# Patient Record
Sex: Female | Born: 1940 | Race: White | Hispanic: No | Marital: Married | Smoking: Former smoker
Health system: Southern US, Community
[De-identification: ages and names within clinical notes are randomized; demographics above are authoritative.]

## PROBLEM LIST (undated history)

## (undated) DIAGNOSIS — K219 Gastro-esophageal reflux disease without esophagitis: Secondary | ICD-10-CM

## (undated) DIAGNOSIS — M2550 Pain in unspecified joint: Secondary | ICD-10-CM

## (undated) DIAGNOSIS — E039 Hypothyroidism, unspecified: Secondary | ICD-10-CM

## (undated) DIAGNOSIS — Z923 Personal history of irradiation: Secondary | ICD-10-CM

## (undated) DIAGNOSIS — D696 Thrombocytopenia, unspecified: Secondary | ICD-10-CM

## (undated) DIAGNOSIS — D649 Anemia, unspecified: Secondary | ICD-10-CM

## (undated) DIAGNOSIS — D709 Neutropenia, unspecified: Secondary | ICD-10-CM

## (undated) DIAGNOSIS — I2510 Atherosclerotic heart disease of native coronary artery without angina pectoris: Secondary | ICD-10-CM

## (undated) DIAGNOSIS — R001 Bradycardia, unspecified: Secondary | ICD-10-CM

## (undated) DIAGNOSIS — T451X5A Adverse effect of antineoplastic and immunosuppressive drugs, initial encounter: Secondary | ICD-10-CM

## (undated) DIAGNOSIS — E669 Obesity, unspecified: Secondary | ICD-10-CM

## (undated) DIAGNOSIS — D6481 Anemia due to antineoplastic chemotherapy: Secondary | ICD-10-CM

## (undated) DIAGNOSIS — Z5111 Encounter for antineoplastic chemotherapy: Secondary | ICD-10-CM

## (undated) DIAGNOSIS — C3492 Malignant neoplasm of unspecified part of left bronchus or lung: Secondary | ICD-10-CM

## (undated) DIAGNOSIS — N6019 Diffuse cystic mastopathy of unspecified breast: Secondary | ICD-10-CM

## (undated) DIAGNOSIS — E785 Hyperlipidemia, unspecified: Secondary | ICD-10-CM

## (undated) DIAGNOSIS — J189 Pneumonia, unspecified organism: Secondary | ICD-10-CM

## (undated) DIAGNOSIS — J449 Chronic obstructive pulmonary disease, unspecified: Secondary | ICD-10-CM

## (undated) DIAGNOSIS — R5081 Fever presenting with conditions classified elsewhere: Secondary | ICD-10-CM

## (undated) DIAGNOSIS — N2889 Other specified disorders of kidney and ureter: Secondary | ICD-10-CM

## (undated) DIAGNOSIS — L03113 Cellulitis of right upper limb: Secondary | ICD-10-CM

## (undated) DIAGNOSIS — M1990 Unspecified osteoarthritis, unspecified site: Secondary | ICD-10-CM

## (undated) DIAGNOSIS — I499 Cardiac arrhythmia, unspecified: Secondary | ICD-10-CM

## (undated) DIAGNOSIS — I10 Essential (primary) hypertension: Secondary | ICD-10-CM

## (undated) HISTORY — DX: Anemia due to antineoplastic chemotherapy: D64.81

## (undated) HISTORY — PX: TUBAL LIGATION: SHX77

## (undated) HISTORY — DX: Thrombocytopenia, unspecified: D69.6

## (undated) HISTORY — DX: Atherosclerotic heart disease of native coronary artery without angina pectoris: I25.10

## (undated) HISTORY — DX: Obesity, unspecified: E66.9

## (undated) HISTORY — DX: Essential (primary) hypertension: I10

## (undated) HISTORY — DX: Chronic obstructive pulmonary disease, unspecified: J44.9

## (undated) HISTORY — DX: Pain in unspecified joint: M25.50

## (undated) HISTORY — DX: Neutropenia, unspecified: D70.9

## (undated) HISTORY — DX: Encounter for antineoplastic chemotherapy: Z51.11

## (undated) HISTORY — DX: Hyperlipidemia, unspecified: E78.5

## (undated) HISTORY — DX: Adverse effect of antineoplastic and immunosuppressive drugs, initial encounter: T45.1X5A

## (undated) HISTORY — DX: Bradycardia, unspecified: R00.1

## (undated) HISTORY — DX: Malignant neoplasm of unspecified part of left bronchus or lung: C34.92

## (undated) HISTORY — DX: Diffuse cystic mastopathy of unspecified breast: N60.19

## (undated) HISTORY — DX: Fever presenting with conditions classified elsewhere: R50.81

## (undated) HISTORY — DX: Cellulitis of right upper limb: L03.113

## (undated) HISTORY — DX: Hypothyroidism, unspecified: E03.9

## (undated) HISTORY — PX: CHOLECYSTECTOMY: SHX55

## (undated) HISTORY — DX: Other specified disorders of kidney and ureter: N28.89

---

## 1999-08-30 ENCOUNTER — Other Ambulatory Visit: Admission: RE | Admit: 1999-08-30 | Discharge: 1999-08-30 | Payer: Self-pay | Admitting: *Deleted

## 2000-11-20 ENCOUNTER — Encounter: Admission: RE | Admit: 2000-11-20 | Discharge: 2000-11-20 | Payer: Self-pay | Admitting: *Deleted

## 2000-11-20 ENCOUNTER — Encounter: Payer: Self-pay | Admitting: *Deleted

## 2009-10-10 DIAGNOSIS — I2510 Atherosclerotic heart disease of native coronary artery without angina pectoris: Secondary | ICD-10-CM

## 2009-10-10 HISTORY — PX: CORONARY ANGIOPLASTY WITH STENT PLACEMENT: SHX49

## 2009-10-10 HISTORY — DX: Atherosclerotic heart disease of native coronary artery without angina pectoris: I25.10

## 2010-03-15 ENCOUNTER — Inpatient Hospital Stay (HOSPITAL_BASED_OUTPATIENT_CLINIC_OR_DEPARTMENT_OTHER): Admission: RE | Admit: 2010-03-15 | Discharge: 2010-03-15 | Payer: Self-pay | Admitting: Interventional Cardiology

## 2010-03-19 ENCOUNTER — Inpatient Hospital Stay (HOSPITAL_COMMUNITY): Admission: RE | Admit: 2010-03-19 | Discharge: 2010-03-20 | Payer: Self-pay | Admitting: Interventional Cardiology

## 2010-12-27 LAB — POCT I-STAT, CHEM 8
BUN: 25 mg/dL — ABNORMAL HIGH (ref 6–23)
Calcium, Ion: 1.17 mmol/L (ref 1.12–1.32)
Chloride: 104 mEq/L (ref 96–112)
Creatinine, Ser: 1.3 mg/dL — ABNORMAL HIGH (ref 0.4–1.2)
Glucose, Bld: 108 mg/dL — ABNORMAL HIGH (ref 70–99)
HCT: 44 % (ref 36.0–46.0)
Hemoglobin: 15 g/dL (ref 12.0–15.0)
Potassium: 3.9 mEq/L (ref 3.5–5.1)
Sodium: 139 mEq/L (ref 135–145)
TCO2: 27 mmol/L (ref 0–100)

## 2010-12-27 LAB — CBC
HCT: 37.7 % (ref 36.0–46.0)
Hemoglobin: 13.3 g/dL (ref 12.0–15.0)
MCHC: 35.2 g/dL (ref 30.0–36.0)
MCV: 94.3 fL (ref 78.0–100.0)
Platelets: 239 10*3/uL (ref 150–400)
RBC: 4 MIL/uL (ref 3.87–5.11)
RDW: 14.6 % (ref 11.5–15.5)
WBC: 6.6 10*3/uL (ref 4.0–10.5)

## 2010-12-27 LAB — BASIC METABOLIC PANEL
BUN: 19 mg/dL (ref 6–23)
CO2: 30 mEq/L (ref 19–32)
Calcium: 9.3 mg/dL (ref 8.4–10.5)
Chloride: 106 mEq/L (ref 96–112)
Creatinine, Ser: 1.31 mg/dL — ABNORMAL HIGH (ref 0.4–1.2)
GFR calc Af Amer: 49 mL/min — ABNORMAL LOW (ref >60)
GFR calc non Af Amer: 40 mL/min — ABNORMAL LOW (ref >60)
Glucose, Bld: 104 mg/dL — ABNORMAL HIGH (ref 70–99)
Potassium: 5.1 mEq/L (ref 3.5–5.1)
Sodium: 142 mEq/L (ref 135–145)

## 2011-04-08 ENCOUNTER — Encounter (HOSPITAL_COMMUNITY): Payer: Self-pay

## 2011-04-11 ENCOUNTER — Ambulatory Visit (HOSPITAL_COMMUNITY)
Admission: RE | Admit: 2011-04-11 | Discharge: 2011-04-11 | Disposition: A | Discharge status: Home / Self Care | Payer: Medicare Other | Source: Ambulatory Visit | Attending: Interventional Cardiology | Admitting: Interventional Cardiology

## 2011-04-11 DIAGNOSIS — R0602 Shortness of breath: Secondary | ICD-10-CM | POA: Insufficient documentation

## 2011-04-11 LAB — PULMONARY FUNCTION TEST

## 2012-09-04 ENCOUNTER — Other Ambulatory Visit: Payer: Self-pay | Admitting: Family Medicine

## 2012-09-04 DIAGNOSIS — Z1231 Encounter for screening mammogram for malignant neoplasm of breast: Secondary | ICD-10-CM

## 2012-10-17 ENCOUNTER — Ambulatory Visit: Payer: Medicare Other

## 2012-11-05 ENCOUNTER — Ambulatory Visit
Admission: RE | Admit: 2012-11-05 | Discharge: 2012-11-05 | Disposition: A | Discharge status: Home / Self Care | Payer: Medicare Other | Source: Ambulatory Visit | Attending: Family Medicine | Admitting: Family Medicine

## 2012-11-05 DIAGNOSIS — Z1231 Encounter for screening mammogram for malignant neoplasm of breast: Secondary | ICD-10-CM

## 2012-11-09 ENCOUNTER — Other Ambulatory Visit: Payer: Self-pay | Admitting: Family Medicine

## 2012-11-09 DIAGNOSIS — R928 Other abnormal and inconclusive findings on diagnostic imaging of breast: Secondary | ICD-10-CM

## 2012-12-04 ENCOUNTER — Ambulatory Visit
Admission: RE | Admit: 2012-12-04 | Discharge: 2012-12-04 | Disposition: A | Discharge status: Home / Self Care | Payer: Medicare Other | Source: Ambulatory Visit | Attending: Family Medicine | Admitting: Family Medicine

## 2012-12-04 DIAGNOSIS — R928 Other abnormal and inconclusive findings on diagnostic imaging of breast: Secondary | ICD-10-CM

## 2013-07-08 ENCOUNTER — Other Ambulatory Visit: Payer: Self-pay | Admitting: Family Medicine

## 2013-07-08 DIAGNOSIS — N6320 Unspecified lump in the left breast, unspecified quadrant: Secondary | ICD-10-CM

## 2013-08-13 ENCOUNTER — Encounter: Payer: Self-pay | Admitting: Interventional Cardiology

## 2013-09-03 ENCOUNTER — Ambulatory Visit
Admission: RE | Admit: 2013-09-03 | Discharge: 2013-09-03 | Disposition: A | Discharge status: Home / Self Care | Payer: Self-pay | Source: Ambulatory Visit | Attending: Family Medicine | Admitting: Family Medicine

## 2013-09-03 DIAGNOSIS — N6320 Unspecified lump in the left breast, unspecified quadrant: Secondary | ICD-10-CM

## 2013-10-28 ENCOUNTER — Other Ambulatory Visit: Payer: Self-pay

## 2013-10-28 DIAGNOSIS — Z1231 Encounter for screening mammogram for malignant neoplasm of breast: Secondary | ICD-10-CM

## 2013-10-29 ENCOUNTER — Ambulatory Visit: Payer: Medicare Other | Admitting: Interventional Cardiology

## 2013-11-04 ENCOUNTER — Encounter: Payer: Self-pay | Admitting: Interventional Cardiology

## 2013-11-04 ENCOUNTER — Encounter: Payer: Self-pay | Admitting: *Deleted

## 2013-11-04 DIAGNOSIS — J449 Chronic obstructive pulmonary disease, unspecified: Secondary | ICD-10-CM | POA: Insufficient documentation

## 2013-11-04 DIAGNOSIS — I25119 Atherosclerotic heart disease of native coronary artery with unspecified angina pectoris: Secondary | ICD-10-CM | POA: Insufficient documentation

## 2013-11-04 DIAGNOSIS — I10 Essential (primary) hypertension: Secondary | ICD-10-CM | POA: Insufficient documentation

## 2013-11-04 DIAGNOSIS — I2510 Atherosclerotic heart disease of native coronary artery without angina pectoris: Secondary | ICD-10-CM | POA: Insufficient documentation

## 2013-11-04 DIAGNOSIS — E039 Hypothyroidism, unspecified: Secondary | ICD-10-CM | POA: Insufficient documentation

## 2013-11-11 ENCOUNTER — Encounter: Payer: Self-pay | Admitting: Interventional Cardiology

## 2013-11-11 ENCOUNTER — Ambulatory Visit (INDEPENDENT_AMBULATORY_CARE_PROVIDER_SITE_OTHER): Payer: Medicare HMO | Admitting: Interventional Cardiology

## 2013-11-11 VITALS — BP 118/70 | HR 77 | Ht 64.0 in | Wt 157.0 lb

## 2013-11-11 DIAGNOSIS — R0789 Other chest pain: Secondary | ICD-10-CM

## 2013-11-11 DIAGNOSIS — I2510 Atherosclerotic heart disease of native coronary artery without angina pectoris: Secondary | ICD-10-CM

## 2013-11-11 DIAGNOSIS — Z720 Tobacco use: Secondary | ICD-10-CM

## 2013-11-11 DIAGNOSIS — F17200 Nicotine dependence, unspecified, uncomplicated: Secondary | ICD-10-CM

## 2013-11-11 NOTE — Patient Instructions (Signed)
Your physician recommends that you continue on your current medications as directed. Please refer to the Current Medication list given to you today.  Your physician wants you to follow-up in: 1 year You will receive a reminder letter in the mail two months in advance. If you don't receive a letter, please call our office to schedule the follow-up appointment.   Call the office if your chest pain becomes exertional or worsens.[phone].   Smoking Cessation Quitting smoking is important to your health and has many advantages. However, it is not always easy to quit since nicotine is a very addictive drug. Often times, people try 3 times or more before being able to quit. This document explains the best ways for you to prepare to quit smoking. Quitting takes hard work and a lot of effort, but you can do it. ADVANTAGES OF QUITTING SMOKING  You will live longer, feel better, and live better.  Your body will feel the impact of quitting smoking almost immediately.  Within 20 minutes, blood pressure decreases. Your pulse returns to its normal level.  After 8 hours, carbon monoxide levels in the blood return to normal. Your oxygen level increases.  After 24 hours, the chance of having a heart attack starts to decrease. Your breath, hair, and body stop smelling like smoke.  After 48 hours, damaged nerve endings begin to recover. Your sense of taste and smell improve.  After 72 hours, the body is virtually free of nicotine. Your bronchial tubes relax and breathing becomes easier.  After 2 to 12 weeks, lungs can hold more air. Exercise becomes easier and circulation improves.  The risk of having a heart attack, stroke, cancer, or lung disease is greatly reduced.  After 1 year, the risk of coronary heart disease is cut in half.  After 5 years, the risk of stroke falls to the same as a nonsmoker.  After 10 years, the risk of lung cancer is cut in half and the risk of other cancers decreases  significantly.  After 15 years, the risk of coronary heart disease drops, usually to the level of a nonsmoker.  If you are pregnant, quitting smoking will improve your chances of having a healthy baby.  The people you live with, especially any children, will be healthier.  You will have extra money to spend on things other than cigarettes. QUESTIONS TO THINK ABOUT BEFORE ATTEMPTING TO QUIT You may want to talk about your answers with your caregiver.  Why do you want to quit?  If you tried to quit in the past, what helped and what did not?  What will be the most difficult situations for you after you quit? How will you plan to handle them?  Who can help you through the tough times? Your family? Friends? A caregiver?  What pleasures do you get from smoking? What ways can you still get pleasure if you quit? Here are some questions to ask your caregiver:  How can you help me to be successful at quitting?  What medicine do you think would be best for me and how should I take it?  What should I do if I need more help?  What is smoking withdrawal like? How can I get information on withdrawal? GET READY  Set a quit date.  Change your environment by getting rid of all cigarettes, ashtrays, matches, and lighters in your home, car, or work. Do not let people smoke in your home.  Review your past attempts to quit. Think about what  worked and what did not. GET SUPPORT AND ENCOURAGEMENT You have a better chance of being successful if you have help. You can get support in many ways.  Tell your family, friends, and co-workers that you are going to quit and need their support. Ask them not to smoke around you.  Get individual, group, or telephone counseling and support. Programs are available at [employer] and health centers. Call your local health department for information about programs in your area.  Spiritual beliefs and practices may help some smokers quit.  Download a "quit  meter" on your computer to keep track of quit statistics, such as how long you have gone without smoking, cigarettes not smoked, and money saved.  Get a self-help book about quitting smoking and staying off of tobacco. [place] yourself from urges to smoke. Talk to someone, go for a walk, or occupy your time with a task.  Change your normal routine. Take a different route to work. Drink tea instead of coffee. Eat breakfast in a different place.  Reduce your stress. Take a hot bath, exercise, or read a book.  Plan something enjoyable to do every day. Reward yourself for not smoking.  Explore interactive web-based programs that specialize in helping you quit. GET MEDICINE AND USE IT CORRECTLY Medicines can help you stop smoking and decrease the urge to smoke. Combining medicine with the above behavioral methods and support can greatly increase your chances of successfully quitting smoking.  Nicotine replacement therapy helps deliver nicotine to your body without the negative effects and risks of smoking. Nicotine replacement therapy includes nicotine gum, lozenges, inhalers, nasal sprays, and skin patches. Some may be available over-the-counter and others require a prescription.  Antidepressant medicine helps people abstain from smoking, but how this works is unknown. This medicine is available by prescription.  Nicotinic receptor partial agonist medicine simulates the effect of nicotine in your brain. This medicine is available by prescription. Ask your caregiver for advice about which medicines to use and how to use them based on your health history. Your caregiver will tell you what side effects to look out for if you choose to be on a medicine or therapy. Carefully read the information on the package. Do not use any other product containing nicotine while using a nicotine replacement product.  RELAPSE OR DIFFICULT SITUATIONS Most relapses occur within the  first 3 months after quitting. Do not be discouraged if you start smoking again. Remember, most people try several times before finally quitting. You may have symptoms of withdrawal because your body is used to nicotine. You may crave cigarettes, be irritable, feel very hungry, cough often, get headaches, or have difficulty concentrating. The withdrawal symptoms are only temporary. They are strongest when you first quit, but they will go away within 10 14 days. To reduce the chances of relapse, try to:  Avoid drinking alcohol. Drinking lowers your chances of successfully quitting.  Reduce the amount of caffeine you consume. Once you quit smoking, the amount of caffeine in your body increases and can give you symptoms, such as a rapid heartbeat, sweating, and anxiety.  Avoid smokers because they can make you want to smoke.  Do not let weight gain distract you. Many smokers will gain weight when they quit, usually less than 10 pounds. Eat a healthy diet and stay active. You can always lose the weight gained after you quit.  Find ways to improve your mood other than smoking. FOR MORE INFORMATION  www.smokefree.gov  Document Released: 09/20/2001 Document Revised: 03/27/2012 Document Reviewed: 01/05/2012 [facility] Patient Information 2014 [place], [place].

## 2013-11-11 NOTE — Progress Notes (Signed)
Patient ID: Renee Vance, female   DOB: 1941-04-28, 73 y.o.   MRN: [id number] Past Medical History  Hypertension   Hyperlipidemia   Hypothyroidism   Stage 3 chronic kidney disease (Dr. Olivia Mackie)   Obesity   Intermittent Back Pain   Fibrocystic Breasts   Tobacco Use   Premature Atrial complexes   CAD - s/p stent 2011   COPD      [address] [place], [place]  [address] Phone: [phone] Fax:  [phone]  Date:  11/11/2013   ID:  Renee Vance, DOB 1940-11-22, MRN [id number]  PCP:  Orpah Melter, MD   ASSESSMENT:  1. Coronary artery disease with prior history of coronary stent 2011. 2. Intermittent chest discomfort not responsive to nitroglycerin and non-exertional 3. Tobacco use, continues  PLAN:  1. Patient is cautioned to keep an eye on the chest discomfort that she complains of and let us know if it becomes exertional or worsens. 2. Smoking cessation 3. Clinical followup in one year   SUBJECTIVE: Renee Vance is a 73 y.o. female who continues to smoke and has a history of coronary disease with stent procedure done in 2011. She has COPD and chronic kidney disease. She has had no prolonged episodes of angina. There is no exertional chest discomfort. She occasionally has vague tightness in the chest that is low intensity. She denies syncope and peripheral edema. Is no claudication. This discomfort tends to occur at rest.   Wt Readings from Last 3 Encounters:  11/11/13 157 lb (71.215 kg)     Past Medical History  Diagnosis Date  . Hyperlipidemia   . HTN (hypertension)   . Pain in joint   . Coronary atherosclerosis of native coronary artery   . Dyspnea   . Bradycardia   . Dyspnea   . HTN (hypertension)   . Hypothyroidism   . Obesity   . Fibrocystic breast   . CAD (coronary artery disease)   . COPD (chronic obstructive pulmonary disease)     Current Outpatient Prescriptions  Medication Sig Dispense Refill  .  amLODipine (NORVASC) 5 MG tablet Take 5 mg by mouth daily.      Marland Kitchen aspirin 81 MG tablet Take 81 mg by mouth daily.      Marland Kitchen atorvastatin (LIPITOR) 80 MG tablet Take 80 mg by mouth daily.      . Cholecalciferol (VITAMIN D) 1000 UNITS capsule daily      . clotrimazole (LOTRIMIN) 1 % cream As needed      . levothyroxine (SYNTHROID, LEVOTHROID) 100 MCG tablet Take 100 mcg by mouth daily before breakfast.      . losartan (COZAAR) 100 MG tablet Take 100 mg by mouth daily.      Marland Kitchen tiotropium (SPIRIVA) 18 MCG inhalation capsule Place 18 mcg into inhaler and inhale daily.       No current facility-administered medications for this visit.    Allergies:    Allergies  Allergen Reactions  . Ampicillin     Makes sick  . Codeine     Makes sick    Social History:  The patient  reports that she has been smoking.  She does not have any smokeless tobacco history on file.   ROS:  Please see the history of present illness.   No neurological complaints. No bleeding or claudication. Dyspnea on exertion   All other systems reviewed and negative.   OBJECTIVE: VS:  BP 118/70  Pulse 77  Ht 5'  4" (1.626 m)  Wt 157 lb (71.215 kg)  BMI 26.94 kg/m2 Well nourished, well developed, in no acute distress, appears older than stated age 39: normal Neck: JVD flat. Carotid bruit absent  Cardiac:  normal S1, S2; RRR; no murmur Lungs:  clear to auscultation bilaterally, no wheezing, rhonchi or rales Abd: soft, nontender, no hepatomegaly Ext: Edema absent. Pulses 2+ and symmetric bilaterally Skin: warm and dry Neuro:  CNs 2-12 intact, no focal abnormalities noted  EKG:  Normal       Signed, Illene Labrador III, MD 11/11/2013 2:15 PM

## 2013-11-26 ENCOUNTER — Ambulatory Visit: Payer: Medicare Other

## 2013-12-17 ENCOUNTER — Ambulatory Visit
Admission: RE | Admit: 2013-12-17 | Discharge: 2013-12-17 | Disposition: A | Discharge status: Home / Self Care | Payer: Commercial Managed Care - HMO | Source: Ambulatory Visit

## 2013-12-17 DIAGNOSIS — Z1231 Encounter for screening mammogram for malignant neoplasm of breast: Secondary | ICD-10-CM

## 2014-11-11 ENCOUNTER — Encounter: Payer: Self-pay | Admitting: Interventional Cardiology

## 2014-11-11 ENCOUNTER — Other Ambulatory Visit: Payer: Self-pay

## 2014-11-11 ENCOUNTER — Ambulatory Visit (INDEPENDENT_AMBULATORY_CARE_PROVIDER_SITE_OTHER): Payer: Commercial Managed Care - HMO | Admitting: Interventional Cardiology

## 2014-11-11 VITALS — BP 130/70 | HR 85 | Ht 64.0 in | Wt 160.4 lb

## 2014-11-11 DIAGNOSIS — I2510 Atherosclerotic heart disease of native coronary artery without angina pectoris: Secondary | ICD-10-CM

## 2014-11-11 DIAGNOSIS — I491 Atrial premature depolarization: Secondary | ICD-10-CM

## 2014-11-11 DIAGNOSIS — Z1231 Encounter for screening mammogram for malignant neoplasm of breast: Secondary | ICD-10-CM

## 2014-11-11 DIAGNOSIS — J449 Chronic obstructive pulmonary disease, unspecified: Secondary | ICD-10-CM

## 2014-11-11 DIAGNOSIS — I10 Essential (primary) hypertension: Secondary | ICD-10-CM

## 2014-11-11 DIAGNOSIS — R0600 Dyspnea, unspecified: Secondary | ICD-10-CM

## 2014-11-11 DIAGNOSIS — E785 Hyperlipidemia, unspecified: Secondary | ICD-10-CM

## 2014-11-11 NOTE — Patient Instructions (Signed)
Your physician wants you to follow-up in: 1 year with Dr Tamala Julian. Laurey Morale 2017) You will receive a reminder letter in the mail two months in advance. If you don't receive a letter, please call our office to schedule the follow-up appointment.

## 2014-11-11 NOTE — Progress Notes (Signed)
Patient ID: Renee Vance, female   DOB: [birth year], 74 y.o.   MRN: [id number]    Cardiology Office Note   Date:  11/11/2014   ID:  Renee Vance, DOB 06/30/1941, MRN [id number]  PCP:  Orpah Melter, MD  Cardiologist:   Sinclair Grooms, MD   No chief complaint on file.     History of Present Illness: Renee Vance is a 74 y.o. female who presents for follow-up of coronary artery disease. She denies any chest discomfort. She has dyspnea on exertion related COPD. She finally stopped smoking last summer. She has not had palpitations or syncope.    Past Medical History  Diagnosis Date  . Hyperlipidemia   . HTN (hypertension)   . Pain in joint   . Coronary atherosclerosis of native coronary artery   . Dyspnea   . Bradycardia   . Dyspnea   . HTN (hypertension)   . Hypothyroidism   . Obesity   . Fibrocystic breast   . CAD (coronary artery disease)   . COPD (chronic obstructive pulmonary disease)     Past Surgical History  Procedure Laterality Date  . Cholecystectomy    . Tubal ligation       Current Outpatient Prescriptions  Medication Sig Dispense Refill  . amLODipine (NORVASC) 5 MG tablet Take 5 mg by mouth daily.    Marland Kitchen aspirin 81 MG tablet Take 81 mg by mouth daily.    Marland Kitchen atorvastatin (LIPITOR) 80 MG tablet Take 80 mg by mouth daily.    . clotrimazole (LOTRIMIN) 1 % cream As needed    . levothyroxine (SYNTHROID, LEVOTHROID) 100 MCG tablet Take 100 mcg by mouth daily before breakfast.    . losartan (COZAAR) 100 MG tablet Take 100 mg by mouth daily.    Marland Kitchen tiotropium (SPIRIVA) 18 MCG inhalation capsule Place 18 mcg into inhaler and inhale daily.    . Vitamin D, Cholecalciferol, 1000 UNITS CAPS Take 1,000 Int'l Units by mouth daily. 1000 iu daily by mouth daily     No current facility-administered medications for this visit.    Allergies:   Ampicillin and Codeine    Social History:  The patient  reports that she quit smoking about 7 months ago. She  does not have any smokeless tobacco history on file.   Family History:  The patient's family history includes Heart disease in her mother.    ROS:  Please see the history of present illness.   Otherwise, review of systems are positive for dyspnea on exertion and occasional wheezing. Improved since smoking cessation.   All other systems are reviewed and negative.    PHYSICAL EXAM: VS:  BP 130/70 mmHg  Pulse 85  Ht 5\' 4"  (1.626 m)  Wt 160 lb 6.4 oz (72.757 kg)  BMI 27.52 kg/m2  SpO2 95% , BMI Body mass index is 27.52 kg/(m^2). GEN: Well nourished, well developed, in no acute distress HEENT: normal Neck: no JVD, carotid bruits, or masses Cardiac: RRR; no murmurs, rubs, or gallops,no edema  Respiratory:  clear to auscultation bilaterally, normal work of breathing GI: soft, nontender, nondistended, + BS MS: no deformity or atrophy Skin: warm and dry, no rash Neuro:  Strength and sensation are intact Psych: euthymic mood, full affect   EKG:  EKG is ordered today. The ekg ordered today demonstrates normal sinus rhythm, nonspecific ST abnormality, poor R-wave progression, and left atrial abnormality.   Recent Labs: No results found for requested labs within last 365 days.  Lipid Panel No results found for: CHOL, TRIG, HDL, CHOLHDL, VLDL, LDLCALC, LDLDIRECT    Wt Readings from Last 3 Encounters:  11/11/14 160 lb 6.4 oz (72.757 kg)  11/11/13 157 lb (71.215 kg)      Other studies Reviewed: Additional studies/ records that were reviewed today include: Copies of the records obtained from [place]. Review of the above records demonstrates: A problem list that included stage III chronic kidney disease, COPD, premature atrial complexes, and hypothyroidism.   ASSESSMENT AND PLAN:  1.  Coronary atherosclerosis with drug-eluting LAD stent, the patient is asymptomatic with reference to ischemia/angina. 2. Chronic dyspnea on exertion likely related to COPD 3. Essential hypertension  under excellent control 4. Hyperlipidemia, followed by primary care 5. Occasional/intermittent palpitations at rest. No sustained tachycardia. No dizziness or lightheadedness. Prior monitoring his demonstrated PACs. No further workup   Current medicines are reviewed at length with the patient today.  The patient does not have concerns regarding medicines.  The following changes have been made:  no change  Labs/ tests ordered today include:   Orders Placed This Encounter  Procedures  . EKG 12-Lead     Disposition:   FU with Linard Millers in 12 months    Signed, Sinclair Grooms, MD  11/11/2014 12:30 PM    [facility] [address], [place], [place]  [address] Phone: [phone]; Fax: [phone]

## 2014-11-12 ENCOUNTER — Ambulatory Visit: Payer: Commercial Managed Care - HMO | Admitting: Interventional Cardiology

## 2014-12-22 ENCOUNTER — Ambulatory Visit
Admission: RE | Admit: 2014-12-22 | Discharge: 2014-12-22 | Disposition: A | Discharge status: Home / Self Care | Payer: Commercial Managed Care - HMO | Source: Ambulatory Visit

## 2014-12-22 DIAGNOSIS — Z1231 Encounter for screening mammogram for malignant neoplasm of breast: Secondary | ICD-10-CM

## 2015-02-06 ENCOUNTER — Emergency Department (HOSPITAL_COMMUNITY)
Admission: EM | Admit: 2015-02-06 | Discharge: 2015-02-06 | Disposition: A | Discharge status: Home / Self Care | Payer: Commercial Managed Care - HMO | Attending: Emergency Medicine | Admitting: Emergency Medicine

## 2015-02-06 ENCOUNTER — Telehealth: Payer: Self-pay | Admitting: Interventional Cardiology

## 2015-02-06 ENCOUNTER — Emergency Department (HOSPITAL_COMMUNITY): Payer: Commercial Managed Care - HMO

## 2015-02-06 ENCOUNTER — Encounter (HOSPITAL_COMMUNITY): Payer: Self-pay | Admitting: Neurology

## 2015-02-06 DIAGNOSIS — Z7982 Long term (current) use of aspirin: Secondary | ICD-10-CM | POA: Diagnosis not present

## 2015-02-06 DIAGNOSIS — Z79899 Other long term (current) drug therapy: Secondary | ICD-10-CM | POA: Diagnosis not present

## 2015-02-06 DIAGNOSIS — I2510 Atherosclerotic heart disease of native coronary artery without angina pectoris: Secondary | ICD-10-CM | POA: Insufficient documentation

## 2015-02-06 DIAGNOSIS — Z8742 Personal history of other diseases of the female genital tract: Secondary | ICD-10-CM | POA: Insufficient documentation

## 2015-02-06 DIAGNOSIS — R002 Palpitations: Secondary | ICD-10-CM

## 2015-02-06 DIAGNOSIS — E785 Hyperlipidemia, unspecified: Secondary | ICD-10-CM | POA: Insufficient documentation

## 2015-02-06 DIAGNOSIS — R079 Chest pain, unspecified: Secondary | ICD-10-CM | POA: Insufficient documentation

## 2015-02-06 DIAGNOSIS — J449 Chronic obstructive pulmonary disease, unspecified: Secondary | ICD-10-CM | POA: Diagnosis not present

## 2015-02-06 DIAGNOSIS — Z87891 Personal history of nicotine dependence: Secondary | ICD-10-CM | POA: Diagnosis not present

## 2015-02-06 DIAGNOSIS — E039 Hypothyroidism, unspecified: Secondary | ICD-10-CM | POA: Insufficient documentation

## 2015-02-06 DIAGNOSIS — E669 Obesity, unspecified: Secondary | ICD-10-CM | POA: Insufficient documentation

## 2015-02-06 DIAGNOSIS — I10 Essential (primary) hypertension: Secondary | ICD-10-CM | POA: Insufficient documentation

## 2015-02-06 LAB — I-STAT TROPONIN, ED
Troponin i, poc: 0 ng/mL (ref 0.00–0.08)
Troponin i, poc: 0.01 ng/mL (ref 0.00–0.08)

## 2015-02-06 LAB — BASIC METABOLIC PANEL
Anion gap: 10 (ref 5–15)
BUN: 21 mg/dL (ref 6–23)
CALCIUM: 9.8 mg/dL (ref 8.4–10.5)
CO2: 26 mmol/L (ref 19–32)
Chloride: 106 mmol/L (ref 96–112)
Creatinine, Ser: 1.59 mg/dL — ABNORMAL HIGH (ref 0.50–1.10)
GFR calc Af Amer: 36 mL/min — ABNORMAL LOW (ref >90)
GFR calc non Af Amer: 31 mL/min — ABNORMAL LOW (ref >90)
Glucose, Bld: 114 mg/dL — ABNORMAL HIGH (ref 70–99)
Potassium: 4.4 mmol/L (ref 3.5–5.1)
SODIUM: 142 mmol/L (ref 135–145)

## 2015-02-06 LAB — CBC
HEMATOCRIT: 39.6 % (ref 36.0–46.0)
Hemoglobin: 13.2 g/dL (ref 12.0–15.0)
MCH: 29.7 pg (ref 26.0–34.0)
MCHC: 33.3 g/dL (ref 30.0–36.0)
MCV: 89.2 fL (ref 78.0–100.0)
Platelets: 252 10*3/uL (ref 150–400)
RBC: 4.44 MIL/uL (ref 3.87–5.11)
RDW: 14 % (ref 11.5–15.5)
WBC: 7.3 10*3/uL (ref 4.0–10.5)

## 2015-02-06 NOTE — Discharge Instructions (Signed)
We saw you in the ER for the palpitations. All the results in the ER are normal, labs and imaging. We are not sure what is causing your symptoms. The workup in the ER is not complete, and is limited to screening for life threatening and emergent conditions only, so please see Cardiologist soon.  Palpitations A palpitation is the feeling that your heartbeat is irregular or is faster than normal. It may feel like your heart is fluttering or skipping a beat. Palpitations are usually not a serious problem. However, in some cases, you may need further medical evaluation. CAUSES  Palpitations can be caused by:  Smoking.  Caffeine or other stimulants, such as diet pills or energy drinks.  Alcohol.  Stress and anxiety.  Strenuous physical activity.  Fatigue.  Certain medicines.  Heart disease, especially if you have a history of irregular heart rhythms (arrhythmias), such as atrial fibrillation, atrial flutter, or supraventricular tachycardia.  An improperly working pacemaker or defibrillator. DIAGNOSIS  To find the cause of your palpitations, your health care provider will take your medical history and perform a physical exam. Your health care provider may also have you take a test called an ambulatory electrocardiogram (ECG). An ECG records your heartbeat patterns over a 24-hour period. You may also have other tests, such as:  Transthoracic echocardiogram (TTE). During echocardiography, sound waves are used to evaluate how blood flows through your heart.  Transesophageal echocardiogram (TEE).  Cardiac monitoring. This allows your health care provider to monitor your heart rate and rhythm in real time.  Holter monitor. This is a portable device that records your heartbeat and can help diagnose heart arrhythmias. It allows your health care provider to track your heart activity for several days, if needed.  Stress tests by exercise or by giving medicine that makes the heart beat  faster. TREATMENT  Treatment of palpitations depends on the cause of your symptoms and can vary greatly. Most cases of palpitations do not require any treatment other than time, relaxation, and monitoring your symptoms. Other causes, such as atrial fibrillation, atrial flutter, or supraventricular tachycardia, usually require further treatment. HOME CARE INSTRUCTIONS   Avoid:  Caffeinated coffee, tea, soft drinks, diet pills, and energy drinks.  Chocolate.  Alcohol.  Stop smoking if you smoke.  Reduce your stress and anxiety. Things that can help you relax include:  A method of controlling things in your body, such as your heartbeats, with your mind (biofeedback).  Yoga.  Meditation.  Physical activity such as swimming, jogging, or walking.  Get plenty of rest and sleep. SEEK MEDICAL CARE IF:   You continue to have a fast or irregular heartbeat beyond 24 hours.  Your palpitations occur more often. SEEK IMMEDIATE MEDICAL CARE IF:  You have chest pain or shortness of breath.  You have a severe headache.  You feel dizzy or you faint. MAKE SURE YOU:  Understand these instructions.  Will watch your condition.  Will get help right away if you are not doing well or get worse. Document Released: 09/23/2000 Document Revised: 10/01/2013 Document Reviewed: 11/25/2011 [facility] Patient Information 2015 [place], [place]. This information is not intended to replace advice given to you by your health care provider. Make sure you discuss any questions you have with your health care provider.

## 2015-02-06 NOTE — ED Notes (Signed)
Pt ambulated to restroom. Placed back on cardiac marker when returned to room.

## 2015-02-06 NOTE — ED Notes (Signed)
Patient transported to X-ray 

## 2015-02-06 NOTE — Telephone Encounter (Signed)
Message noted pt went to the ED.attempted to call pt back

## 2015-02-06 NOTE — Telephone Encounter (Signed)
Follow Up       Pt calling stating that her symptoms are getting worse and she hasn't heard back from anyone yet so she is going to the ER.

## 2015-02-06 NOTE — Telephone Encounter (Signed)
New Message   Patient c/o Palpitations:  High priority if patient c/o lightheadedness and shortness of breath.  1. How long have you been having palpitations?since 4 am  2. Are you currently experiencing lightheadedness and shortness of breath?SOB   3. Have you checked your BP and heart rate? (document readings) no   4. Are you experiencing any other symptoms?no

## 2015-02-06 NOTE — ED Notes (Signed)
Pt reports feeling like her heart is racing and having palpations since yesterday. Feeling some sob. Denies pain. Pt is a x 4.

## 2015-02-06 NOTE — ED Provider Notes (Signed)
CSN: [id number]     Arrival date & time 02/06/15  1035 History   First MD Initiated Contact with Patient 02/06/15 1121     Chief Complaint  Patient presents with  . Palpitations     (Consider location/radiation/quality/duration/timing/severity/associated sxs/prior Treatment) HPI Comments: Pt comes in with cc of palpitations. Pt has hx of CAD, COPD. She reports that for the past 2 days she has been having intermittent palpitations. With the palpations she gets some chest discomfort. This AM, pt had palpitations, described as "racing heart and possibly skipping of beats" from 4 am to 8 am. Pt had no dib, dizziness, but felt tired. She called Cardiology, but didn't hear back from them, so she decided to come to the ER and get checked out. She has no symptoms currently. No precipitating, aggravaiting or relieving factors.  Patient is a 74 y.o. female presenting with palpitations. The history is provided by the patient.  Palpitations Associated symptoms: chest pain   Associated symptoms: no nausea, no shortness of breath and no vomiting     Past Medical History  Diagnosis Date  . Hyperlipidemia   . HTN (hypertension)   . Pain in joint   . Coronary atherosclerosis of native coronary artery   . Dyspnea   . Bradycardia   . Dyspnea   . HTN (hypertension)   . Hypothyroidism   . Obesity   . Fibrocystic breast   . CAD (coronary artery disease)   . COPD (chronic obstructive pulmonary disease)    Past Surgical History  Procedure Laterality Date  . Cholecystectomy    . Tubal ligation     Family History  Problem Relation Age of Onset  . Heart disease Mother    History  Substance Use Topics  . Smoking status: Former Smoker    Quit date: 04/10/2014  . Smokeless tobacco: Not on file     Comment: june 2015  . Alcohol Use: Not on file   OB History    No data available     Review of Systems  Constitutional: Positive for activity change.  Respiratory: Negative for shortness of  breath.   Cardiovascular: Positive for chest pain and palpitations.  Gastrointestinal: Negative for nausea, vomiting and abdominal pain.  Genitourinary: Negative for dysuria.  Musculoskeletal: Negative for neck pain.  Neurological: Negative for syncope and headaches.      Allergies  Ampicillin and Codeine  Home Medications   Prior to Admission medications   Medication Sig Start Date End Date Taking? Authorizing Provider  amLODipine (NORVASC) 5 MG tablet Take 5 mg by mouth daily.    Historical Provider, MD  aspirin 81 MG tablet Take 81 mg by mouth daily.    Historical Provider, MD  atorvastatin (LIPITOR) 80 MG tablet Take 80 mg by mouth daily.    Historical Provider, MD  clotrimazole (LOTRIMIN) 1 % cream As needed 10/19/13   Historical Provider, MD  levothyroxine (SYNTHROID, LEVOTHROID) 100 MCG tablet Take 100 mcg by mouth daily before breakfast.    Historical Provider, MD  losartan (COZAAR) 100 MG tablet Take 100 mg by mouth daily.    Historical Provider, MD  tiotropium (SPIRIVA) 18 MCG inhalation capsule Place 18 mcg into inhaler and inhale daily.    Historical Provider, MD  Vitamin D, Cholecalciferol, 1000 UNITS CAPS Take 1,000 Int'l Units by mouth daily. 1000 iu daily by mouth daily 08/25/14   Historical Provider, MD   BP 133/89 mmHg  Pulse 69  Temp(Src) 98.4 F (36.9 C) (Oral)  Resp  17  Ht '5\' 4"'$  (1.626 m)  Wt 150 lb (68.04 kg)  BMI 25.73 kg/m2  SpO2 99% Physical Exam  Constitutional: She is oriented to person, place, and time. She appears well-developed and well-nourished.  HENT:  Head: Normocephalic and atraumatic.  Eyes: EOM are normal. Pupils are equal, round, and reactive to light.  Neck: Neck supple.  Cardiovascular: Normal rate and regular rhythm.   Pulmonary/Chest: Effort normal. No respiratory distress.  Abdominal: Soft. She exhibits no distension. There is no tenderness. There is no rebound and no guarding.  Neurological: She is alert and oriented to person,  place, and time.  Skin: Skin is warm and dry.  Nursing note and vitals reviewed.   ED Course  Procedures (including critical care time) Labs Review Labs Reviewed  BASIC METABOLIC PANEL - Abnormal; Notable for the following:    Glucose, Bld 114 (*)    Creatinine, Ser 1.59 (*)    GFR calc non Af Amer 31 (*)    GFR calc Af Amer 36 (*)    All other components within normal limits  CBC  I-STAT TROPOININ, ED  Randolm Idol, ED    Imaging Review Dg Chest 2 View  02/06/2015   CLINICAL DATA:  Cardiac palpitations  EXAM: CHEST  2 VIEW  COMPARISON:  April 04, 2011  FINDINGS: There is no edema or consolidation. Heart size and pulmonary vascularity are normal. No adenopathy. No bone lesions.  IMPRESSION: No edema or consolidation.   Electronically Signed   By: Lowella Grip III M.D.   On: 02/06/2015 11:46     EKG Interpretation   Date/Time:  Friday February 06 2015 10:43:16 EDT Ventricular Rate:  82 PR Interval:  179 QRS Duration: 83 QT Interval:  348 QTC Calculation: 406 R Axis:   55 Text Interpretation:  Sinus tachycardia Multiform ventricular premature  complexes Probable anteroseptal infarct, old No significant change since  last tracing Confirmed by Kathrynn Humble, MD, Thelma Comp [phone]) on 02/06/2015  10:56:33 AM      MDM   Final diagnoses:  Palpitations    Pt comes in with cc of palpitations. She is asymptomatic. Hx of CAD, and had some chest discomfort with palpitations. Trops ordered. Will monitor for a little bit, if we have no + tracings, then i have already spoken with the Cards master and pt will be reached out for the appointment.   Varney Biles, MD 02/06/15 1537

## 2015-02-09 ENCOUNTER — Telehealth: Payer: Self-pay | Admitting: Interventional Cardiology

## 2015-02-09 DIAGNOSIS — R002 Palpitations: Secondary | ICD-10-CM

## 2015-02-09 NOTE — Telephone Encounter (Signed)
New message     Patient calling back to speak to nurse from Friday.

## 2015-02-09 NOTE — Telephone Encounter (Signed)
Returned pt call. Pt sts that she has been doing well since her trip to the ED. She reports that on the morning of 4/29. She has an episode of palpitations and it felt like her heart was racing. She decided to go to the ED. By that time her symptoms had resolved. She was monitored at the ED for 4-5 hours than released. The ED physician told her she needs to wear a cardiac monitor for a couple of days, and that some one from [facility] heartcare would call her to schedule. Adv her that I did not see an order. Adv Her I will update Dr.Smith, to make sure he is in agreement, if so a scheduler from our office will call her to schedule. Pt thanked me for the assistance and verbalized understanding

## 2015-02-10 NOTE — Addendum Note (Signed)
Addended by: Lamar Laundry on: 02/10/2015 05:32 PM   Modules accepted: Orders

## 2015-02-10 NOTE — Telephone Encounter (Signed)
Pt aware of Dr.Smith's response She should wear a thirty-day continuous monitor to exclude atrial fibrillation Adv her a scheduler from our office will call her to schedule.

## 2015-02-10 NOTE — Telephone Encounter (Signed)
Routed to Dr.Smith to advise 

## 2015-02-10 NOTE — Telephone Encounter (Signed)
She should wear a thirty-day continuous monitor to exclude atrial fibrillation

## 2015-02-16 ENCOUNTER — Telehealth: Payer: Self-pay | Admitting: Interventional Cardiology

## 2015-02-16 DIAGNOSIS — R002 Palpitations: Secondary | ICD-10-CM

## 2015-02-16 NOTE — Telephone Encounter (Signed)
New message    Patient calling regarding scheduling heart monitor & going away for 30 days please advise.

## 2015-02-16 NOTE — Telephone Encounter (Signed)
Pt monitor appt scheduled for 5/10

## 2015-02-17 ENCOUNTER — Ambulatory Visit (INDEPENDENT_AMBULATORY_CARE_PROVIDER_SITE_OTHER): Payer: Commercial Managed Care - HMO

## 2015-02-17 ENCOUNTER — Encounter: Payer: Self-pay | Admitting: *Deleted

## 2015-02-17 DIAGNOSIS — R002 Palpitations: Secondary | ICD-10-CM

## 2015-02-17 NOTE — Progress Notes (Signed)
Patient ID: Renee Vance, female   DOB: 1941/02/24, 74 y.o.   MRN: [id number] [employer] 30 day cardiac event monitor applied to patient.

## 2015-03-26 ENCOUNTER — Encounter: Payer: Self-pay | Admitting: Interventional Cardiology

## 2015-03-26 DIAGNOSIS — I480 Paroxysmal atrial fibrillation: Secondary | ICD-10-CM | POA: Insufficient documentation

## 2015-03-30 ENCOUNTER — Telehealth: Payer: Self-pay

## 2015-03-30 MED ORDER — METOPROLOL SUCCINATE ER 25 MG PO TB24: 25.0000 mg | ORAL_TABLET | Freq: Every day | ORAL | Status: DC

## 2015-03-30 NOTE — Telephone Encounter (Signed)
Pt aware of Cardiac monitor results and Dr.Smith's recommendations -Brief afib runs -START metoprolol Succ '25mg'$  daily -OV in 1 month to discuss Anti-coag -Continue Asa '81mg'$  qd for now  -Rx sent to [facility] order (pt rqst) -appt scheduled with S.Weaver, PA 8/1  Pt is currently asymptomatic, pt adv to contact the office if symptoms develop, or if she feel she is not tolerating Metoprolol Pt verbalized understanding.

## 2015-04-27 ENCOUNTER — Telehealth: Payer: Self-pay | Admitting: Interventional Cardiology

## 2015-04-27 MED ORDER — METOPROLOL SUCCINATE ER 25 MG PO TB24: 25.0000 mg | ORAL_TABLET | Freq: Two times a day (BID) | ORAL | Status: DC

## 2015-04-27 NOTE — Telephone Encounter (Signed)
New Message  . 1. Is this related to a heart monitor you are wearing?  (If the patient says no, please ask     if they are caling about ICD/pacemaker.) 30 day event monitor.   2. What is your issue?? (If the patient is calling for results of the heart monitor this     message should be sent to nurse.) Pt states that she was wearing a heart monitor. Pt states that there was a problem with the heart monitor. It stoped but it is doing it again. After asking the patient what "it" was, she reported that she feels a lot of fluttering. She states that this is mostly every night now. Requests a call back from the nurse

## 2015-04-27 NOTE — Telephone Encounter (Signed)
Pt called as each evening since having the monitor off she has experienced palpitations and fluttering feeling. The feeling last all night and into the morning.  Pt states she does not notice any additional SOB as she already has COPD. Pt stated that her HR has been 60-100 when she checks it and that her BP has been 159/79; 135/92; 133/73.  At times she she has chest tightness but not every day.  No c/o of CP or pain radiating. Pt stated she has started taking the Metoprolol Succ. 25 mg daily as ordered previously.  Pt has scheduled appt on 8/1 with Kathleen Argue, PA.  Reviewed with Flex, Kathleen Argue, requested that pt take 25 mg Metoprolol Succ. BID and schedule sooner appt to start on anti-coag, as pt only takes ASA 81 mg daily. Pt aware of medication increase and scheduled for appt with Rosaria Ferries Tues. 7/19 @ 2 PM.  Pt agrees with plan no additional questions at this time.

## 2015-04-28 ENCOUNTER — Encounter: Payer: Self-pay | Admitting: Physician Assistant

## 2015-04-28 ENCOUNTER — Ambulatory Visit (INDEPENDENT_AMBULATORY_CARE_PROVIDER_SITE_OTHER): Payer: Commercial Managed Care - HMO | Admitting: Physician Assistant

## 2015-04-28 VITALS — BP 120/62 | HR 67 | Ht 64.0 in | Wt 157.8 lb

## 2015-04-28 DIAGNOSIS — I480 Paroxysmal atrial fibrillation: Secondary | ICD-10-CM | POA: Diagnosis not present

## 2015-04-28 LAB — PROTIME-INR
INR: 1 ratio (ref 0.8–1.0)
PROTHROMBIN TIME: 11.5 s (ref 9.6–13.1)

## 2015-04-28 LAB — CBC
HCT: 39.9 % (ref 36.0–46.0)
Hemoglobin: 13.3 g/dL (ref 12.0–15.0)
MCHC: 33.3 g/dL (ref 30.0–36.0)
MCV: 89.3 fl (ref 78.0–100.0)
PLATELETS: 253 10*3/uL (ref 150.0–400.0)
RBC: 4.46 Mil/uL (ref 3.87–5.11)
RDW: 15.3 % (ref 11.5–15.5)
WBC: 7.6 10*3/uL (ref 4.0–10.5)

## 2015-04-28 MED ORDER — WARFARIN SODIUM 5 MG PO TABS: 5.0000 mg | ORAL_TABLET | Freq: Every day | ORAL | Status: DC

## 2015-04-28 NOTE — Progress Notes (Signed)
Cardiology Office Note   Date:  04/28/2015   ID:  Renee Vance, DOB Feb 22, 1941, MRN [id number]  PCP:  Orpah Melter, MD  Cardiologist:  Dr Oscar La, PA-C   No chief complaint on file.   History of Present Illness: Renee Vance is a 74 y.o. female with a history of stent to the RCA 2011 w/ mod LAD dz (FFR 0.88), nl EF; HTN, HL, COPD. Evaluated for palpitations with a 30-day monitor. Dr Tamala Julian reviewed this and recommended pt come in to discuss anticoagulation. Pt had appt w/ S. Weaver, but had more palpitations and appt moved up.  Renee Vance presents for further evaluation of palpitations.  Ms Renee Vance is having multiple episodes of palpitations daily. She drinks caffeinated coffee all day and has several cups in the evening. She will wake up frequently to urinate and feel the palpitations. She has never taken her BP/HR during the palpitations. The palpitations have improved slightly on the BB, and she has taken it BID only once, last pm. She thinks the palpitations improved a little this am. The palpitations are least bad during the day, but still present at times. She has had no chest pain with them. When they are bad, she will feel a little SOB. She has not had presyncope.    Past Medical History  Diagnosis Date  . Hyperlipidemia   . HTN (hypertension)   . Pain in joint   . Coronary atherosclerosis of native coronary artery 2011  . Dyspnea   . Bradycardia   . Dyspnea   . HTN (hypertension)   . Hypothyroidism   . Obesity   . Fibrocystic breast   . CAD (coronary artery disease)   . COPD (chronic obstructive pulmonary disease)     Past Surgical History  Procedure Laterality Date  . Cholecystectomy    . Tubal ligation    . Coronary angioplasty with stent placement  2011    Lmain 30-40%, LAD 65-75% (FFR 0.88), CFX 55-60%, RCA 95%>0 w/ 2.5 x 12 mm monorail stent    Current Outpatient Prescriptions  Medication Sig Dispense Refill  .  amLODipine (NORVASC) 5 MG tablet Take 5 mg by mouth daily.    Marland Kitchen aspirin 81 MG tablet Take 81 mg by mouth daily.    Marland Kitchen atorvastatin (LIPITOR) 80 MG tablet Take 80 mg by mouth daily.    . clotrimazole (LOTRIMIN) 1 % cream Apply 1 application topically once as needed (DRY SKIN). As needed    . levothyroxine (SYNTHROID, LEVOTHROID) 125 MCG tablet     . losartan (COZAAR) 100 MG tablet Take 100 mg by mouth daily.    . metoprolol succinate (TOPROL XL) 25 MG 24 hr tablet Take 1 tablet (25 mg total) by mouth 2 (two) times daily. 90 tablet 3  . tiotropium (SPIRIVA) 18 MCG inhalation capsule Place 18 mcg into inhaler and inhale daily.    . Vitamin D, Cholecalciferol, 1000 UNITS CAPS Take 1,000 Int'l Units by mouth daily. 1000 iu daily by mouth daily    . warfarin (COUMADIN) 5 MG tablet Take 1 tablet (5 mg total) by mouth daily at 6 PM. 30 tablet 11   No current facility-administered medications for this visit.    Allergies:   Ampicillin and Codeine    Social History:  The patient  reports that she quit smoking about 12 months ago. She does not have any smokeless tobacco history on file.   Family History:  The patient's family history  includes Heart disease in her mother.    ROS:  Please see the history of present illness. All other systems are reviewed and negative.    PHYSICAL EXAM: VS:  BP 120/62 mmHg  Pulse 67  Ht '5\' 4"'$  (1.626 m)  Wt 157 lb 12.8 oz (71.578 kg)  BMI 27.07 kg/m2 , BMI Body mass index is 27.07 kg/(m^2). GEN: Well nourished, well developed, in no acute distress HEENT: normal Neck: no JVD, carotid bruits, or masses Cardiac: RRR; no murmurs, rubs, or gallops,no edema  Respiratory:  Few basilar rales bilaterally, normal work of breathing. Barrel-chested appearance GI: soft, nontender, nondistended, + BS MS: no deformity or atrophy Skin: warm and dry, no rash Neuro:  Strength and sensation are intact Psych: euthymic mood, full affect   EKG:  EKG is ordered today. The ekg  ordered today demonstrates SR, no acute changes   Recent Labs: 02/06/2015: BUN 21; Creatinine, Ser 1.59*; Hemoglobin 13.2; Platelets 252; Potassium 4.4; Sodium 142    Lipid Panel No results found for: CHOL, TRIG, HDL, CHOLHDL, VLDL, LDLCALC, LDLDIRECT   Wt Readings from Last 3 Encounters:  04/28/15 157 lb 12.8 oz (71.578 kg)  02/06/15 150 lb (68.04 kg)  11/11/14 160 lb 6.4 oz (72.757 kg)     Other studies Reviewed: Additional studies/ records that were reviewed today include: event monitor, previous ECG and office notes.  ASSESSMENT AND PLAN:  1.  Tachypalpitations, probable PAF vs PAT:  Pt intake of caffeine is very high and may contribute to her palpitations. She is asked to decrease this gradually.   She is asked to take her BP/HR during an episode of palpitations as this may give [place] more information on them.  This patients CHA2DS2-VASc Score and unadjusted Ischemic Stroke Rate (% per year) is equal to 7.2 % stroke rate/year from a score of 5 Above score calculated as 1 point each if present [CHF, HTN, DM, Vascular=MI/PAD/Aortic Plaque, Age if 65-74, or Female], 2 points each if present [Age > 75, or Stroke/TIA/TE].    Coumadin is indicated and will be started in conjunction with the coumadin clinic. She was offered NOACs but does not think she can afford them. We will check screening labs today and have her f/u with the coumadin clinic after being on the medication approximately 5 days.  Current medicines are reviewed at length with the patient today.  The patient does not have concerns regarding medicines.  The following changes have been made:  Add coumadin  Labs/ tests ordered today include:   Orders Placed This Encounter  Procedures  . CBC  . INR/PT  . EKG 12-Lead     Disposition:   FU with Dr Tamala Julian in 3 months and with the coumadin clinic next week.  Augusto Garbe  04/28/2015 2:56 PM    [facility] [address],  [place], [place]  [address] Phone: [phone]; Fax: [phone]

## 2015-04-28 NOTE — Patient Instructions (Addendum)
Medication Instructions:  Your physician has recommended you make the following change in your medication:   1- START coumadin 5 mg by mouth daily.  2- Continue Aspirin until coumadin is therapeutic. Your levels will be check in the Coumadin Clinic.   Lab work: Your physician recommends that you have labs today. PT/INR and CBC  Testing/Procedures: NONE  Follow-Up: Your physician recommends that you schedule a follow-up appointment on Monday 05/04/2015 with Coumadin Clinic (New coumadin appointment).  Your physician recommends that you schedule a follow-up appointment in: 3 months with Dr. Tamala Julian and [place] appointment with Richardson Dopp PA.   Any Other Special Instructions Will Be Listed Below (If Applicable).

## 2015-05-04 ENCOUNTER — Ambulatory Visit (INDEPENDENT_AMBULATORY_CARE_PROVIDER_SITE_OTHER): Payer: Commercial Managed Care - HMO | Admitting: Pharmacist

## 2015-05-04 DIAGNOSIS — Z5181 Encounter for therapeutic drug level monitoring: Secondary | ICD-10-CM | POA: Diagnosis not present

## 2015-05-04 DIAGNOSIS — I480 Paroxysmal atrial fibrillation: Secondary | ICD-10-CM | POA: Diagnosis not present

## 2015-05-04 LAB — POCT INR: INR: 2.3

## 2015-05-11 ENCOUNTER — Ambulatory Visit: Payer: Commercial Managed Care - HMO | Admitting: Physician Assistant

## 2015-05-12 ENCOUNTER — Ambulatory Visit (INDEPENDENT_AMBULATORY_CARE_PROVIDER_SITE_OTHER): Payer: Commercial Managed Care - HMO | Admitting: *Deleted

## 2015-05-12 DIAGNOSIS — I480 Paroxysmal atrial fibrillation: Secondary | ICD-10-CM

## 2015-05-12 DIAGNOSIS — Z5181 Encounter for therapeutic drug level monitoring: Secondary | ICD-10-CM | POA: Diagnosis not present

## 2015-05-12 LAB — POCT INR: INR: 4

## 2015-05-19 ENCOUNTER — Ambulatory Visit (INDEPENDENT_AMBULATORY_CARE_PROVIDER_SITE_OTHER): Payer: Commercial Managed Care - HMO | Admitting: *Deleted

## 2015-05-19 DIAGNOSIS — Z5181 Encounter for therapeutic drug level monitoring: Secondary | ICD-10-CM

## 2015-05-19 DIAGNOSIS — I480 Paroxysmal atrial fibrillation: Secondary | ICD-10-CM

## 2015-05-19 LAB — POCT INR: INR: 2.7

## 2015-05-21 ENCOUNTER — Other Ambulatory Visit: Payer: Self-pay | Admitting: *Deleted

## 2015-05-21 MED ORDER — METOPROLOL SUCCINATE ER 25 MG PO TB24
25.0000 mg | ORAL_TABLET | Freq: Two times a day (BID) | ORAL | Status: DC
Start: 2015-05-21 — End: 2015-08-19

## 2015-06-02 ENCOUNTER — Ambulatory Visit (INDEPENDENT_AMBULATORY_CARE_PROVIDER_SITE_OTHER): Payer: Commercial Managed Care - HMO | Admitting: *Deleted

## 2015-06-02 DIAGNOSIS — Z5181 Encounter for therapeutic drug level monitoring: Secondary | ICD-10-CM

## 2015-06-02 DIAGNOSIS — I480 Paroxysmal atrial fibrillation: Secondary | ICD-10-CM | POA: Diagnosis not present

## 2015-06-02 LAB — POCT INR: INR: 2.8

## 2015-06-23 ENCOUNTER — Ambulatory Visit (INDEPENDENT_AMBULATORY_CARE_PROVIDER_SITE_OTHER): Payer: Commercial Managed Care - HMO | Admitting: *Deleted

## 2015-06-23 DIAGNOSIS — I480 Paroxysmal atrial fibrillation: Secondary | ICD-10-CM

## 2015-06-23 DIAGNOSIS — Z5181 Encounter for therapeutic drug level monitoring: Secondary | ICD-10-CM | POA: Diagnosis not present

## 2015-06-23 LAB — POCT INR: INR: 3.1

## 2015-07-14 ENCOUNTER — Ambulatory Visit (INDEPENDENT_AMBULATORY_CARE_PROVIDER_SITE_OTHER): Payer: Commercial Managed Care - HMO | Admitting: *Deleted

## 2015-07-14 DIAGNOSIS — I480 Paroxysmal atrial fibrillation: Secondary | ICD-10-CM | POA: Diagnosis not present

## 2015-07-14 DIAGNOSIS — Z5181 Encounter for therapeutic drug level monitoring: Secondary | ICD-10-CM | POA: Diagnosis not present

## 2015-07-14 LAB — POCT INR: INR: 2.3

## 2015-08-11 ENCOUNTER — Ambulatory Visit (INDEPENDENT_AMBULATORY_CARE_PROVIDER_SITE_OTHER): Payer: Commercial Managed Care - HMO | Admitting: *Deleted

## 2015-08-11 DIAGNOSIS — Z5181 Encounter for therapeutic drug level monitoring: Secondary | ICD-10-CM | POA: Diagnosis not present

## 2015-08-11 DIAGNOSIS — I480 Paroxysmal atrial fibrillation: Secondary | ICD-10-CM

## 2015-08-11 LAB — POCT INR: INR: 2.4

## 2015-08-19 ENCOUNTER — Ambulatory Visit (INDEPENDENT_AMBULATORY_CARE_PROVIDER_SITE_OTHER): Payer: Commercial Managed Care - HMO | Admitting: Interventional Cardiology

## 2015-08-19 ENCOUNTER — Encounter: Payer: Self-pay | Admitting: Interventional Cardiology

## 2015-08-19 VITALS — BP 120/68 | HR 65 | Ht 64.0 in | Wt 161.0 lb

## 2015-08-19 DIAGNOSIS — I2510 Atherosclerotic heart disease of native coronary artery without angina pectoris: Secondary | ICD-10-CM | POA: Insufficient documentation

## 2015-08-19 DIAGNOSIS — I10 Essential (primary) hypertension: Secondary | ICD-10-CM

## 2015-08-19 DIAGNOSIS — I480 Paroxysmal atrial fibrillation: Secondary | ICD-10-CM | POA: Diagnosis not present

## 2015-08-19 DIAGNOSIS — Z7901 Long term (current) use of anticoagulants: Secondary | ICD-10-CM

## 2015-08-19 MED ORDER — METOPROLOL SUCCINATE ER 50 MG PO TB24: 50.0000 mg | ORAL_TABLET | Freq: Two times a day (BID) | ORAL | Status: DC

## 2015-08-19 NOTE — Progress Notes (Signed)
Cardiology Office Note   Date:  08/19/2015   ID:  Renee Vance, DOB 1941/08/09, MRN [id number]  PCP:  Orpah Melter, MD  Cardiologist:  Sinclair Grooms, MD   Chief Complaint  Patient presents with  . Atrial Fibrillation      History of Present Illness: Renee Vance is a 74 y.o. female who presents for   paroxysmal atrial fibrillation, hypertension, coronary artery disease, and chronic anticoagulation therapy.   Still having palpitations particularly in the morning that she can feel when she awakens. Palpitations continue for less than 60 minutes and then eventually resolved. It is frightening when they occur.   No complications since starting Coumadin therapy. No bleeding or injury.    Past Medical History  Diagnosis Date  . Hyperlipidemia   . HTN (hypertension)   . Pain in joint   . Coronary atherosclerosis of native coronary artery 2011  . Dyspnea   . Bradycardia   . Dyspnea   . HTN (hypertension)   . Hypothyroidism   . Obesity   . Fibrocystic breast   . CAD (coronary artery disease)   . COPD (chronic obstructive pulmonary disease) [facility])     Past Surgical History  Procedure Laterality Date  . Cholecystectomy    . Tubal ligation    . Coronary angioplasty with stent placement  2011    Lmain 30-40%, LAD 65-75% (FFR 0.88), CFX 55-60%, RCA 95%>0 w/ 2.5 x 12 mm monorail stent     Current Outpatient Prescriptions  Medication Sig Dispense Refill  . amLODipine (NORVASC) 5 MG tablet Take 5 mg by mouth daily.    Marland Kitchen aspirin 81 MG tablet Take 81 mg by mouth daily.    Marland Kitchen atorvastatin (LIPITOR) 80 MG tablet Take 80 mg by mouth daily.    . clotrimazole (LOTRIMIN) 1 % cream Apply 1 application topically once as needed (DRY SKIN). As needed    . levothyroxine (SYNTHROID, LEVOTHROID) 125 MCG tablet Take 125 mcg by mouth daily before breakfast.     . losartan (COZAAR) 100 MG tablet Take 100 mg by mouth daily.    . metoprolol succinate (TOPROL-XL) 50 MG  24 hr tablet Take 1 tablet (50 mg total) by mouth 2 (two) times daily. 180 tablet 0  . PRESCRIPTION MEDICATION EFFERVESCENT DENTURE TABS...Marland KitchenMarland KitchenUse as directed    . tiotropium (SPIRIVA) 18 MCG inhalation capsule Place 18 mcg into inhaler and inhale daily.    . Vitamin D, Cholecalciferol, 1000 UNITS CAPS Take 1,000 Int'l Units by mouth daily. 1000 iu daily by mouth daily    . warfarin (COUMADIN) 5 MG tablet Take 1 tablet (5 mg total) by mouth daily at 6 PM. 30 tablet 11   No current facility-administered medications for this visit.    Allergies:   Ampicillin and Codeine    Social History:  The patient  reports that she quit smoking about 16 months ago. She has never used smokeless tobacco.   Family History:  The patient's family history includes Heart disease in her mother.    ROS:  Please see the history of present illness.   Otherwise, review of systems are positive for  Awakens frequently from sleep, up to 4 times per night. Frequent urination at night..   All other systems are reviewed and negative.    PHYSICAL EXAM: VS:  BP 120/68 mmHg  Pulse 65  Ht '5\' 4"'$  (1.626 m)  Wt 73.029 kg (161 lb)  BMI 27.62 kg/m2  SpO2 96% ,  BMI Body mass index is 27.62 kg/(m^2). GEN: Well nourished, well developed, in no acute distress HEENT: normal Neck: no JVD, carotid bruits, or masses Cardiac: RRR.  There is no murmur, rub, or gallop. There is no edema. Respiratory:  clear to auscultation bilaterally, normal work of breathing. GI: soft, nontender, nondistended, + BS MS: no deformity or atrophy Skin: warm and dry, no rash Neuro:  Strength and sensation are intact Psych: euthymic mood, full affect   EKG:  EKG  Is not ordered today.    Recent Labs: 02/06/2015: BUN 21; Creatinine, Ser 1.59*; Potassium 4.4; Sodium 142 04/28/2015: Hemoglobin 13.3; Platelets 253.0    Lipid Panel No results found for: CHOL, TRIG, HDL, CHOLHDL, VLDL, LDLCALC, LDLDIRECT    Wt Readings from Last 3 Encounters:    08/19/15 73.029 kg (161 lb)  04/28/15 71.578 kg (157 lb 12.8 oz)  02/06/15 68.04 kg (150 lb)      Other studies Reviewed: Additional studies/ records that were reviewed today include:  none. The findings include  none.    ASSESSMENT AND PLAN:  1. PAF (paroxysmal atrial fibrillation) ([place]) Still having symptomatic palpitations. Still having significant palpitations particularly early each morning.  2. Essential hypertension Controlled  3. Coronary artery disease involving native coronary artery of native heart without angina pectoris Asymptomatic   4. Chronic anticoagulation therapy On Coumadin therapy    Current medicines are reviewed at length with the patient today.  The patient has the following concerns regarding medicines:  none.  The following changes/actions have been instituted:     Increase metoprolol to 50 mg twice a day to help better control and arrhythmia symptoms.   Give [place] a follow-up phone call concerning the effectiveness of the beta blocker as suppressing the arrhythmia   Clinical follow-up in one year  Labs/ tests ordered today include:  No orders of the defined types were placed in this encounter.     Disposition:   FU with HS in 1 years  Signed, Sinclair Grooms, MD  08/19/2015 6:13 PM    [facility] [place], [place], [place]  [address] Phone: [phone]; Fax: [phone]

## 2015-08-19 NOTE — Patient Instructions (Addendum)
Medication Instructions:  Your physician has recommended you make the following change in your medication:  INCREASE Metoprolol to '50mg'$  bid. An Rx has been sent to your pharmacy   Labwork: None ordered  Testing/Procedures: None ordered  Follow-Up: Your physician wants you to follow-up in: 10-12 months with Dr.Smith You will receive a reminder letter in the mail two months in advance. If you don't receive a letter, please call our office to schedule the follow-up appointment.   Any Other Special Instructions Will Be Listed Below (If Applicable). Call the office in 2 weeks with an update of your symptoms    If you need a refill on your cardiac medications before your next appointment, please call your pharmacy.

## 2015-09-22 ENCOUNTER — Ambulatory Visit (INDEPENDENT_AMBULATORY_CARE_PROVIDER_SITE_OTHER): Payer: Commercial Managed Care - HMO | Admitting: *Deleted

## 2015-09-22 DIAGNOSIS — Z5181 Encounter for therapeutic drug level monitoring: Secondary | ICD-10-CM

## 2015-09-22 DIAGNOSIS — I480 Paroxysmal atrial fibrillation: Secondary | ICD-10-CM | POA: Diagnosis not present

## 2015-09-22 LAB — POCT INR: INR: 2.1

## 2015-10-05 ENCOUNTER — Emergency Department (HOSPITAL_COMMUNITY)
Admission: EM | Admit: 2015-10-05 | Discharge: 2015-10-05 | Disposition: A | Discharge status: Home / Self Care | Payer: Commercial Managed Care - HMO | Attending: Emergency Medicine | Admitting: Emergency Medicine

## 2015-10-05 ENCOUNTER — Encounter (HOSPITAL_COMMUNITY): Payer: Self-pay | Admitting: *Deleted

## 2015-10-05 DIAGNOSIS — Z7982 Long term (current) use of aspirin: Secondary | ICD-10-CM | POA: Insufficient documentation

## 2015-10-05 DIAGNOSIS — Z9861 Coronary angioplasty status: Secondary | ICD-10-CM | POA: Diagnosis not present

## 2015-10-05 DIAGNOSIS — E785 Hyperlipidemia, unspecified: Secondary | ICD-10-CM | POA: Diagnosis not present

## 2015-10-05 DIAGNOSIS — Z7901 Long term (current) use of anticoagulants: Secondary | ICD-10-CM | POA: Diagnosis not present

## 2015-10-05 DIAGNOSIS — J449 Chronic obstructive pulmonary disease, unspecified: Secondary | ICD-10-CM | POA: Insufficient documentation

## 2015-10-05 DIAGNOSIS — I2510 Atherosclerotic heart disease of native coronary artery without angina pectoris: Secondary | ICD-10-CM | POA: Diagnosis not present

## 2015-10-05 DIAGNOSIS — Z79899 Other long term (current) drug therapy: Secondary | ICD-10-CM | POA: Insufficient documentation

## 2015-10-05 DIAGNOSIS — M25531 Pain in right wrist: Secondary | ICD-10-CM | POA: Insufficient documentation

## 2015-10-05 DIAGNOSIS — Z86018 Personal history of other benign neoplasm: Secondary | ICD-10-CM | POA: Insufficient documentation

## 2015-10-05 DIAGNOSIS — I10 Essential (primary) hypertension: Secondary | ICD-10-CM | POA: Insufficient documentation

## 2015-10-05 DIAGNOSIS — Z87891 Personal history of nicotine dependence: Secondary | ICD-10-CM | POA: Insufficient documentation

## 2015-10-05 DIAGNOSIS — E039 Hypothyroidism, unspecified: Secondary | ICD-10-CM | POA: Insufficient documentation

## 2015-10-05 DIAGNOSIS — E669 Obesity, unspecified: Secondary | ICD-10-CM | POA: Insufficient documentation

## 2015-10-05 MED ORDER — HYDROCODONE-ACETAMINOPHEN 5-325 MG PO TABS
2.0000 | ORAL_TABLET | Freq: Once | ORAL | Status: AC
  Administered 2015-10-05: 2 via ORAL
  Filled 2015-10-05: qty 2

## 2015-10-05 MED ORDER — HYDROCODONE-ACETAMINOPHEN 5-325 MG PO TABS: 1.0000 | ORAL_TABLET | Freq: Four times a day (QID) | ORAL | Status: DC | PRN

## 2015-10-05 NOTE — ED Notes (Signed)
Declined W/C at D/C and was escorted to lobby by RN. 

## 2015-10-05 NOTE — ED Notes (Signed)
Pt reports RT wrist pain started on 10-02-15 and the pain is worse today. Pt denies any injury .

## 2015-10-05 NOTE — Discharge Instructions (Signed)

## 2015-10-05 NOTE — ED Provider Notes (Signed)
CSN: [id number]     Arrival date & time 10/05/15  1504 History  By signing my name below, I, Erling Conte, attest that this documentation has been prepared under the direction and in the presence of Montine Circle, PA-C Electronically Signed: Erling Conte, ED Scribe. 10/05/2015. 4:58 PM.    Chief Complaint  Patient presents with  . Wrist Pain   The history is provided by the patient. No language interpreter was used.    HPI Comments: Renee Vance is a 74 y.o. female with a h/o HTN, arthritis, CAD, COPD and hyperlipidemia who presents to the Emergency Department with a chief complaint of constant, moderate, right wrist pain onset 4 days. 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 Pt reports the pain is exacerbated with use and movement of the right hand. Pt has been wearing a brace on the wrist with mild relief. She notes that it is her husband's brace so it does not properly fit. She denies any other treatments tried prior to arrival. Pt denies any fall or direct injury to the wrist. She denies any swelling, bruising or other associated symptoms.   Past Medical History  Diagnosis Date  . Hyperlipidemia   . HTN (hypertension)   . Pain in joint   . Coronary atherosclerosis of native coronary artery 2011  . Dyspnea   . Bradycardia   . Dyspnea   . HTN (hypertension)   . Hypothyroidism   . Obesity   . Fibrocystic breast   . CAD (coronary artery disease)   . COPD (chronic obstructive pulmonary disease) [facility])    Past Surgical History  Procedure Laterality Date  . Cholecystectomy    . Tubal ligation    . Coronary angioplasty with stent placement  2011    Lmain 30-40%, LAD 65-75% (FFR 0.88), CFX 55-60%, RCA 95%>0 w/ 2.5 x 12 mm monorail stent   Family History  Problem Relation Age of Onset  . Heart disease Mother    Social History  Substance Use Topics  . Smoking status: Former Smoker    Quit date: 04/10/2014   . Smokeless tobacco: Never Used     Comment: june 2015  . Alcohol Use: None   OB History    No data available     Review of Systems  Musculoskeletal: Positive for arthralgias. Negative for joint swelling.  Skin: Negative for color change and wound.      Allergies  Ampicillin and Codeine  Home Medications   Prior to Admission medications   Medication Sig Start Date End Date Taking? Authorizing Provider  amLODipine (NORVASC) 5 MG tablet Take 5 mg by mouth daily.    Historical Provider, MD  aspirin 81 MG tablet Take 81 mg by mouth daily.    Historical Provider, MD  atorvastatin (LIPITOR) 80 MG tablet Take 80 mg by mouth daily.    Historical Provider, MD  clotrimazole (LOTRIMIN) 1 % cream Apply 1 application topically once as needed (DRY SKIN). As needed 10/19/13   Historical Provider, MD  levothyroxine (SYNTHROID, LEVOTHROID) 125 MCG tablet Take 125 mcg by mouth daily before breakfast.  02/26/15   Historical Provider, MD  losartan (COZAAR) 100 MG tablet Take 100 mg by mouth daily.    Historical Provider, MD  metoprolol succinate (TOPROL-XL) 50 MG 24 hr tablet Take 1 tablet (50 mg total) by mouth 2 (two) times daily. 08/19/15   Belva Crome, MD  PRESCRIPTION MEDICATION EFFERVESCENT  DENTURE TABS...Marland KitchenMarland KitchenUse as directed 08/12/15   Historical Provider, MD  tiotropium (SPIRIVA) 18 MCG inhalation capsule Place 18 mcg into inhaler and inhale daily.    Historical Provider, MD  Vitamin D, Cholecalciferol, 1000 UNITS CAPS Take 1,000 Int'l Units by mouth daily. 1000 iu daily by mouth daily 08/25/14   Historical Provider, MD  warfarin (COUMADIN) 5 MG tablet Take 1 tablet (5 mg total) by mouth daily at 6 PM. 04/28/15   Evelene Croon Barrett, PA-C   Triage Vitals:  BP 156/61 mmHg  Pulse 62  Temp(Src) 98.1 F (36.7 C) (Oral)  Resp 20  Ht '5\' 4"'$  (1.626 m)  Wt 158 lb (71.668 kg)  BMI 27.11 kg/m2  SpO2 97%  Physical Exam  Constitutional: She is oriented to person, place, and time. She appears  well-developed and well-nourished.  HENT:  Head: Normocephalic and atraumatic.  Eyes: Conjunctivae and EOM are normal.  Neck: Normal range of motion.  Cardiovascular: Normal rate and intact distal pulses.   Intact distal pulses with brisk capillary refill  Pulmonary/Chest: Effort normal.  Abdominal: She exhibits no distension.  Musculoskeletal: Normal range of motion.  Right wrist mildly tender to palpation over the palmar aspect, no bony abnormality or deformity, range of motion and strength limited secondary to pain, positive Phalen, positive Tinel  Neurological: She is alert and oriented to person, place, and time.  Skin: Skin is dry.  No excessive warmth or erythema, no indication of septic joint, cellulitis, or gout  Psychiatric: She has a normal mood and affect. Her behavior is normal. Judgment and thought content normal.  Nursing note and vitals reviewed.   ED Course  Procedures (including critical care time)  DIAGNOSTIC STUDIES: Oxygen Saturation is 97% on RA, normla by my interpretation.    COORDINATION OF CARE: 4:59 PM- Discussed plan with pt and pt agrees. She would not like an x-ray at this time due to no injury to the area. Pt requests referral to Dr. Gerrit Heck because her husband has seen him in the past. Will order a wrist splint and pain medication.      MDM   Final diagnoses:  Right wrist pain    Patient with right wrist pain. Onset 3 days ago. Concern for arthritis, versus possible ganglion cyst. Also potentially carpal tunnel syndrome. No evidence of acute deformity, no evidence of erythema, cellulitis, septic joint, or gout. Will treat with wrist splint, and pain medicine. Recommend hand follow-up. Patient's husband sees Dr. Amedeo Plenty, and they would like to go to him.  I personally performed the services described in this documentation, which was scribed in my presence. The recorded information has been reviewed and is accurate.      Montine Circle,  PA-C 10/05/15 [place], MD 10/06/15 [phone]

## 2015-11-02 DIAGNOSIS — J441 Chronic obstructive pulmonary disease with (acute) exacerbation: Secondary | ICD-10-CM | POA: Diagnosis not present

## 2015-11-03 ENCOUNTER — Ambulatory Visit (INDEPENDENT_AMBULATORY_CARE_PROVIDER_SITE_OTHER): Payer: PPO | Admitting: *Deleted

## 2015-11-03 DIAGNOSIS — I480 Paroxysmal atrial fibrillation: Secondary | ICD-10-CM | POA: Diagnosis not present

## 2015-11-03 DIAGNOSIS — Z5181 Encounter for therapeutic drug level monitoring: Secondary | ICD-10-CM

## 2015-11-03 LAB — POCT INR: INR: 2.8

## 2015-11-17 ENCOUNTER — Ambulatory Visit (INDEPENDENT_AMBULATORY_CARE_PROVIDER_SITE_OTHER): Payer: PPO | Admitting: *Deleted

## 2015-11-17 DIAGNOSIS — Z5181 Encounter for therapeutic drug level monitoring: Secondary | ICD-10-CM

## 2015-11-17 DIAGNOSIS — I480 Paroxysmal atrial fibrillation: Secondary | ICD-10-CM | POA: Diagnosis not present

## 2015-11-17 LAB — POCT INR: INR: 2.9

## 2015-12-29 ENCOUNTER — Ambulatory Visit (INDEPENDENT_AMBULATORY_CARE_PROVIDER_SITE_OTHER): Payer: PPO | Admitting: *Deleted

## 2015-12-29 DIAGNOSIS — Z5181 Encounter for therapeutic drug level monitoring: Secondary | ICD-10-CM

## 2015-12-29 DIAGNOSIS — I480 Paroxysmal atrial fibrillation: Secondary | ICD-10-CM

## 2015-12-29 LAB — POCT INR: INR: 3.3

## 2016-01-06 ENCOUNTER — Other Ambulatory Visit: Payer: Self-pay

## 2016-01-06 MED ORDER — METOPROLOL SUCCINATE ER 50 MG PO TB24: 50.0000 mg | ORAL_TABLET | Freq: Two times a day (BID) | ORAL | Status: DC

## 2016-01-19 ENCOUNTER — Ambulatory Visit (INDEPENDENT_AMBULATORY_CARE_PROVIDER_SITE_OTHER): Payer: PPO | Admitting: *Deleted

## 2016-01-19 DIAGNOSIS — Z5181 Encounter for therapeutic drug level monitoring: Secondary | ICD-10-CM | POA: Diagnosis not present

## 2016-01-19 DIAGNOSIS — I480 Paroxysmal atrial fibrillation: Secondary | ICD-10-CM | POA: Diagnosis not present

## 2016-01-19 LAB — POCT INR: INR: 2.2

## 2016-01-21 DIAGNOSIS — N183 Chronic kidney disease, stage 3 (moderate): Secondary | ICD-10-CM | POA: Diagnosis not present

## 2016-01-25 DIAGNOSIS — I129 Hypertensive chronic kidney disease with stage 1 through stage 4 chronic kidney disease, or unspecified chronic kidney disease: Secondary | ICD-10-CM | POA: Diagnosis not present

## 2016-01-25 DIAGNOSIS — N183 Chronic kidney disease, stage 3 (moderate): Secondary | ICD-10-CM | POA: Diagnosis not present

## 2016-01-25 DIAGNOSIS — M9080 Osteopathy in diseases classified elsewhere, unspecified site: Secondary | ICD-10-CM | POA: Diagnosis not present

## 2016-01-25 DIAGNOSIS — I480 Paroxysmal atrial fibrillation: Secondary | ICD-10-CM | POA: Diagnosis not present

## 2016-01-25 DIAGNOSIS — E559 Vitamin D deficiency, unspecified: Secondary | ICD-10-CM | POA: Diagnosis not present

## 2016-01-25 DIAGNOSIS — E889 Metabolic disorder, unspecified: Secondary | ICD-10-CM | POA: Diagnosis not present

## 2016-02-08 ENCOUNTER — Other Ambulatory Visit: Payer: Self-pay

## 2016-02-08 DIAGNOSIS — Z1231 Encounter for screening mammogram for malignant neoplasm of breast: Secondary | ICD-10-CM

## 2016-02-16 ENCOUNTER — Ambulatory Visit (INDEPENDENT_AMBULATORY_CARE_PROVIDER_SITE_OTHER): Payer: PPO | Admitting: *Deleted

## 2016-02-16 DIAGNOSIS — Z5181 Encounter for therapeutic drug level monitoring: Secondary | ICD-10-CM

## 2016-02-16 DIAGNOSIS — I480 Paroxysmal atrial fibrillation: Secondary | ICD-10-CM

## 2016-02-16 LAB — POCT INR: INR: 3.6

## 2016-02-24 DIAGNOSIS — J449 Chronic obstructive pulmonary disease, unspecified: Secondary | ICD-10-CM | POA: Diagnosis not present

## 2016-02-24 DIAGNOSIS — E039 Hypothyroidism, unspecified: Secondary | ICD-10-CM | POA: Diagnosis not present

## 2016-02-24 DIAGNOSIS — I10 Essential (primary) hypertension: Secondary | ICD-10-CM | POA: Diagnosis not present

## 2016-02-24 DIAGNOSIS — Z7901 Long term (current) use of anticoagulants: Secondary | ICD-10-CM | POA: Diagnosis not present

## 2016-02-24 DIAGNOSIS — I2510 Atherosclerotic heart disease of native coronary artery without angina pectoris: Secondary | ICD-10-CM | POA: Diagnosis not present

## 2016-02-24 DIAGNOSIS — I480 Paroxysmal atrial fibrillation: Secondary | ICD-10-CM | POA: Diagnosis not present

## 2016-02-24 DIAGNOSIS — N183 Chronic kidney disease, stage 3 (moderate): Secondary | ICD-10-CM | POA: Diagnosis not present

## 2016-02-24 DIAGNOSIS — R930 Abnormal findings on diagnostic imaging of skull and head, not elsewhere classified: Secondary | ICD-10-CM | POA: Diagnosis not present

## 2016-02-24 DIAGNOSIS — K219 Gastro-esophageal reflux disease without esophagitis: Secondary | ICD-10-CM | POA: Diagnosis not present

## 2016-02-25 ENCOUNTER — Other Ambulatory Visit (HOSPITAL_BASED_OUTPATIENT_CLINIC_OR_DEPARTMENT_OTHER): Payer: Self-pay | Admitting: Physician Assistant

## 2016-02-25 DIAGNOSIS — R6889 Other general symptoms and signs: Secondary | ICD-10-CM

## 2016-02-26 ENCOUNTER — Other Ambulatory Visit (HOSPITAL_COMMUNITY): Payer: Self-pay | Admitting: Physician Assistant

## 2016-02-26 DIAGNOSIS — R6889 Other general symptoms and signs: Secondary | ICD-10-CM

## 2016-02-29 ENCOUNTER — Ambulatory Visit: Payer: PPO

## 2016-03-01 ENCOUNTER — Ambulatory Visit
Admission: RE | Admit: 2016-03-01 | Discharge: 2016-03-01 | Disposition: A | Discharge status: Home / Self Care | Payer: PPO | Source: Ambulatory Visit

## 2016-03-01 DIAGNOSIS — Z1231 Encounter for screening mammogram for malignant neoplasm of breast: Secondary | ICD-10-CM

## 2016-03-04 ENCOUNTER — Other Ambulatory Visit: Payer: Self-pay | Admitting: Interventional Cardiology

## 2016-03-04 DIAGNOSIS — I480 Paroxysmal atrial fibrillation: Secondary | ICD-10-CM

## 2016-03-04 MED ORDER — WARFARIN SODIUM 5 MG PO TABS: ORAL_TABLET | ORAL | Status: DC

## 2016-03-04 NOTE — Telephone Encounter (Signed)
Refill:   warfarin (COUMADIN) 5 MG  Send to [facility], [place], [place]   Requesting a 90 day supply

## 2016-03-04 NOTE — Telephone Encounter (Signed)
Refill sent.

## 2016-03-08 ENCOUNTER — Other Ambulatory Visit: Payer: Self-pay | Admitting: *Deleted

## 2016-03-08 ENCOUNTER — Ambulatory Visit (INDEPENDENT_AMBULATORY_CARE_PROVIDER_SITE_OTHER): Payer: PPO | Admitting: *Deleted

## 2016-03-08 DIAGNOSIS — I480 Paroxysmal atrial fibrillation: Secondary | ICD-10-CM

## 2016-03-08 DIAGNOSIS — Z5181 Encounter for therapeutic drug level monitoring: Secondary | ICD-10-CM | POA: Diagnosis not present

## 2016-03-08 LAB — POCT INR: INR: 2

## 2016-03-08 MED ORDER — WARFARIN SODIUM 5 MG PO TABS: ORAL_TABLET | ORAL | Status: DC

## 2016-03-28 ENCOUNTER — Ambulatory Visit (HOSPITAL_COMMUNITY)
Admission: RE | Admit: 2016-03-28 | Discharge: 2016-03-28 | Disposition: A | Discharge status: Home / Self Care | Payer: PPO | Source: Ambulatory Visit | Attending: Physician Assistant | Admitting: Physician Assistant

## 2016-03-28 DIAGNOSIS — R930 Abnormal findings on diagnostic imaging of skull and head, not elsewhere classified: Secondary | ICD-10-CM | POA: Diagnosis not present

## 2016-03-28 DIAGNOSIS — R221 Localized swelling, mass and lump, neck: Secondary | ICD-10-CM | POA: Diagnosis not present

## 2016-03-28 DIAGNOSIS — R220 Localized swelling, mass and lump, head: Secondary | ICD-10-CM | POA: Insufficient documentation

## 2016-03-28 DIAGNOSIS — R6889 Other general symptoms and signs: Secondary | ICD-10-CM

## 2016-03-29 ENCOUNTER — Ambulatory Visit (HOSPITAL_COMMUNITY): Payer: PPO

## 2016-03-29 ENCOUNTER — Ambulatory Visit (INDEPENDENT_AMBULATORY_CARE_PROVIDER_SITE_OTHER): Payer: PPO | Admitting: *Deleted

## 2016-03-29 DIAGNOSIS — I480 Paroxysmal atrial fibrillation: Secondary | ICD-10-CM

## 2016-03-29 DIAGNOSIS — Z5181 Encounter for therapeutic drug level monitoring: Secondary | ICD-10-CM

## 2016-03-29 LAB — POCT INR: INR: 2.2

## 2016-04-26 ENCOUNTER — Ambulatory Visit (INDEPENDENT_AMBULATORY_CARE_PROVIDER_SITE_OTHER): Payer: PPO | Admitting: *Deleted

## 2016-04-26 DIAGNOSIS — Z5181 Encounter for therapeutic drug level monitoring: Secondary | ICD-10-CM

## 2016-04-26 DIAGNOSIS — I480 Paroxysmal atrial fibrillation: Secondary | ICD-10-CM | POA: Diagnosis not present

## 2016-04-26 LAB — POCT INR: INR: 2.2

## 2016-05-29 ENCOUNTER — Emergency Department (HOSPITAL_COMMUNITY): Payer: PPO

## 2016-05-29 ENCOUNTER — Emergency Department (HOSPITAL_COMMUNITY)
Admission: EM | Admit: 2016-05-29 | Discharge: 2016-05-29 | Disposition: A | Discharge status: Home / Self Care | Payer: PPO | Attending: Emergency Medicine | Admitting: Emergency Medicine

## 2016-05-29 ENCOUNTER — Encounter (HOSPITAL_COMMUNITY): Payer: Self-pay | Admitting: *Deleted

## 2016-05-29 DIAGNOSIS — Z87891 Personal history of nicotine dependence: Secondary | ICD-10-CM | POA: Diagnosis not present

## 2016-05-29 DIAGNOSIS — Z7982 Long term (current) use of aspirin: Secondary | ICD-10-CM | POA: Insufficient documentation

## 2016-05-29 DIAGNOSIS — R042 Hemoptysis: Secondary | ICD-10-CM | POA: Diagnosis not present

## 2016-05-29 DIAGNOSIS — N289 Disorder of kidney and ureter, unspecified: Secondary | ICD-10-CM | POA: Diagnosis not present

## 2016-05-29 DIAGNOSIS — I10 Essential (primary) hypertension: Secondary | ICD-10-CM | POA: Insufficient documentation

## 2016-05-29 DIAGNOSIS — I2510 Atherosclerotic heart disease of native coronary artery without angina pectoris: Secondary | ICD-10-CM | POA: Insufficient documentation

## 2016-05-29 DIAGNOSIS — R791 Abnormal coagulation profile: Secondary | ICD-10-CM | POA: Diagnosis not present

## 2016-05-29 DIAGNOSIS — Z79899 Other long term (current) drug therapy: Secondary | ICD-10-CM | POA: Insufficient documentation

## 2016-05-29 DIAGNOSIS — Z7901 Long term (current) use of anticoagulants: Secondary | ICD-10-CM | POA: Diagnosis not present

## 2016-05-29 DIAGNOSIS — J449 Chronic obstructive pulmonary disease, unspecified: Secondary | ICD-10-CM | POA: Insufficient documentation

## 2016-05-29 DIAGNOSIS — E039 Hypothyroidism, unspecified: Secondary | ICD-10-CM | POA: Insufficient documentation

## 2016-05-29 LAB — CBC WITH DIFFERENTIAL/PLATELET
Basophils Absolute: 0 10*3/uL (ref 0.0–0.1)
Basophils Relative: 0 %
EOS ABS: 0.1 10*3/uL (ref 0.0–0.7)
Eosinophils Relative: 2 %
HCT: 35.8 % — ABNORMAL LOW (ref 36.0–46.0)
HEMOGLOBIN: 11.4 g/dL — AB (ref 12.0–15.0)
LYMPHS ABS: 1.1 10*3/uL (ref 0.7–4.0)
LYMPHS PCT: 14 %
MCH: 28.6 pg (ref 26.0–34.0)
MCHC: 31.8 g/dL (ref 30.0–36.0)
MCV: 89.9 fL (ref 78.0–100.0)
Monocytes Absolute: 0.8 10*3/uL (ref 0.1–1.0)
Monocytes Relative: 10 %
NEUTROS ABS: 6.1 10*3/uL (ref 1.7–7.7)
NEUTROS PCT: 74 %
Platelets: 250 10*3/uL (ref 150–400)
RBC: 3.98 MIL/uL (ref 3.87–5.11)
RDW: 14.4 % (ref 11.5–15.5)
WBC: 8.1 10*3/uL (ref 4.0–10.5)

## 2016-05-29 LAB — BASIC METABOLIC PANEL
ANION GAP: 9 (ref 5–15)
BUN: 30 mg/dL — ABNORMAL HIGH (ref 6–20)
CHLORIDE: 105 mmol/L (ref 101–111)
CO2: 24 mmol/L (ref 22–32)
CREATININE: 1.55 mg/dL — AB (ref 0.44–1.00)
Calcium: 9.6 mg/dL (ref 8.9–10.3)
GFR calc non Af Amer: 31 mL/min — ABNORMAL LOW (ref >60)
GFR, EST AFRICAN AMERICAN: 36 mL/min — AB (ref >60)
Glucose, Bld: 120 mg/dL — ABNORMAL HIGH (ref 65–99)
POTASSIUM: 4 mmol/L (ref 3.5–5.1)
SODIUM: 138 mmol/L (ref 135–145)

## 2016-05-29 LAB — PROTIME-INR
INR: 1.81
PROTHROMBIN TIME: 21.2 s — AB (ref 11.4–15.2)

## 2016-05-29 MED ORDER — ALBUTEROL SULFATE 108 (90 BASE) MCG/ACT IN AEPB: 2.0000 | INHALATION_SPRAY | RESPIRATORY_TRACT | 0 refills | Status: DC | PRN

## 2016-05-29 MED ORDER — IPRATROPIUM-ALBUTEROL 0.5-2.5 (3) MG/3ML IN SOLN
3.0000 mL | Freq: Once | RESPIRATORY_TRACT | Status: AC
  Administered 2016-05-29: 3 mL via RESPIRATORY_TRACT
  Filled 2016-05-29: qty 3

## 2016-05-29 NOTE — Discharge Instructions (Signed)
Call your Coumadin clinic to see how they would want to adjust your Coumadin dose. Return to the ED if you start coughing up more blood or if you start running a fever.

## 2016-05-29 NOTE — ED Triage Notes (Signed)
The pt is c/o waking up coughing just a little while ago  She was coughing dark colored sputum.  No recent cold or cough alert orineted skin warm and d ry  No distress

## 2016-05-29 NOTE — ED Provider Notes (Signed)
[facility] DEPT Provider Note   CSN: [id number] Arrival date & time: 05/29/16  0202  By signing my name below, I, Reola Mosher, attest that this documentation has been prepared under the direction and in the presence of Delora Fuel, MD. Electronically Signed: Reola Mosher, ED Scribe. 05/29/16. 2:51 AM.  History   Chief Complaint Chief Complaint  Patient presents with  . Hemoptysis   The history is provided by the patient and the spouse. No language interpreter was used.   HPI Comments: Renee Vance is a 75 y.o. female with a PMHx of COPD (on sporetia), HTN, HLD, CAD w/ stent placement, Hypothyroidism, and obesity, who presents to the Emergency Department complaining of sudden onset, intermittent hemoptysis onset ~3 hours ago. Husband reports that the blood was initially very dark, but it has now lightened to be bright red. Pt states that she has had a cough over the past few days, but states that it did not include any associated blood until ~3 hours ago. Pt notes that during her initial episode of hemoptysis that she also began to wheeze, but it has since resolved. She states that she is chronically SOB secondary to her COPD, but denies any acute changes in her breathing. No recent medication changes or bursts of antibiotics. She denies fever, chills, or any other associated symptoms.   PCP: [name]  Past Medical History:  Diagnosis Date  . Bradycardia   . CAD (coronary artery disease)   . COPD (chronic obstructive pulmonary disease) ([place])   . Coronary atherosclerosis of native coronary artery 2011  . Dyspnea   . Dyspnea   . Fibrocystic breast   . HTN (hypertension)   . HTN (hypertension)   . Hyperlipidemia   . Hypothyroidism   . Obesity   . Pain in joint    Patient Active Problem List   Diagnosis Date Noted  . Chronic anticoagulation 08/19/2015  . Coronary artery disease involving native coronary artery of native heart without angina  pectoris 08/19/2015  . Encounter for therapeutic drug monitoring 05/04/2015  . PAF (paroxysmal atrial fibrillation) ([place]) 03/26/2015  . Premature atrial contractions 11/11/2014  . Hyperlipidemia   . HTN (hypertension)   . Pain in joint   . Coronary atherosclerosis of native coronary artery   . Dyspnea   . Bradycardia   . Hypothyroidism   . Obesity   . Fibrocystic breast   . COPD (chronic obstructive pulmonary disease) ([place])    Past Surgical History:  Procedure Laterality Date  . CHOLECYSTECTOMY    . CORONARY ANGIOPLASTY WITH STENT PLACEMENT  2011   Lmain 30-40%, LAD 65-75% (FFR 0.88), CFX 55-60%, RCA 95%>0 w/ 2.5 x 12 mm monorail stent  . TUBAL LIGATION     OB History    No data available     Home Medications    Prior to Admission medications   Medication Sig Start Date End Date Taking? Authorizing Provider  amLODipine (NORVASC) 5 MG tablet Take 5 mg by mouth daily.    Historical Provider, MD  aspirin 81 MG tablet Take 81 mg by mouth daily.    Historical Provider, MD  atorvastatin (LIPITOR) 80 MG tablet Take 80 mg by mouth daily.    Historical Provider, MD  clotrimazole (LOTRIMIN) 1 % cream Apply 1 application topically once as needed (DRY SKIN). As needed 10/19/13   Historical Provider, MD  HYDROcodone-acetaminophen (NORCO/VICODIN) 5-325 MG tablet Take 1-2 tablets by mouth every 6 (six) hours as needed. 10/05/15   Herbie Baltimore  Browning, PA-C  levothyroxine (SYNTHROID, LEVOTHROID) 125 MCG tablet Take 125 mcg by mouth daily before breakfast.  02/26/15   Historical Provider, MD  losartan (COZAAR) 100 MG tablet Take 100 mg by mouth daily.    Historical Provider, MD  metoprolol succinate (TOPROL-XL) 50 MG 24 hr tablet Take 1 tablet (50 mg total) by mouth 2 (two) times daily. 01/06/16   Belva Crome, MD  PRESCRIPTION MEDICATION EFFERVESCENT DENTURE TABS...Marland KitchenMarland KitchenUse as directed 08/12/15   Historical Provider, MD  tiotropium (SPIRIVA) 18 MCG inhalation capsule Place 18 mcg into inhaler and inhale  daily.    Historical Provider, MD  Vitamin D, Cholecalciferol, 1000 UNITS CAPS Take 1,000 Int'l Units by mouth daily. 1000 iu daily by mouth daily 08/25/14   Historical Provider, MD  warfarin (COUMADIN) 5 MG tablet Take as directed by Coumadin clinic. 03/08/16   Belva Crome, MD   Family History Family History  Problem Relation Age of Onset  . Heart disease Mother    Social History Social History  Substance Use Topics  . Smoking status: Former Smoker    Quit date: 04/10/2014  . Smokeless tobacco: Never Used     Comment: june 2015  . Alcohol use No   Allergies   Ampicillin and Codeine  Review of Systems Review of Systems  Constitutional: Negative for chills and fever.  Respiratory: Positive for cough, shortness of breath (chronic) and wheezing.        Positive for hemoptysis.  All other systems reviewed and are negative.  Physical Exam Updated Vital Signs BP 167/64 (BP Location: Right Arm)   Pulse 71   Temp 99 F (37.2 C)   Resp 18   Ht '5\' 4"'$  (1.626 m)   Wt 153 lb 9 oz (69.7 kg)   SpO2 96%   BMI 26.36 kg/m   Physical Exam  Constitutional: She is oriented to person, place, and time. She appears well-developed and well-nourished.  HENT:  Head: Normocephalic and atraumatic.  Eyes: EOM are normal. Pupils are equal, round, and reactive to light.  Neck: Normal range of motion. Neck supple. No JVD present.  Cardiovascular: Normal rate, regular rhythm and normal heart sounds.   No murmur heard. Pulmonary/Chest: She has no wheezes. She has rales. She exhibits no tenderness.  Steady prolonged exhalation phase. Few rales at the right base.   Abdominal: Soft. Bowel sounds are normal. She exhibits no distension and no mass. There is no tenderness.  Musculoskeletal: Normal range of motion. She exhibits no edema.  Lymphadenopathy:    She has no cervical adenopathy.  Neurological: She is alert and oriented to person, place, and time. No cranial nerve deficit. She exhibits normal  muscle tone. Coordination normal.  Skin: Skin is warm and dry. No rash noted.  Psychiatric: She has a normal mood and affect. Her behavior is normal. Judgment and thought content normal.  Nursing note and vitals reviewed.  ED Treatments / Results  DIAGNOSTIC STUDIES: Oxygen Saturation is 96% on RA, normal by my interpretation.   COORDINATION OF CARE: 2:51 AM-Discussed next steps with pt. Pt verbalized understanding and is agreeable with the plan.   Labs (all labs ordered are listed, but only abnormal results are displayed) Labs Reviewed  BASIC METABOLIC PANEL - Abnormal; Notable for the following:       Result Value   Glucose, Bld 120 (*)    BUN 30 (*)    Creatinine, Ser 1.55 (*)    GFR calc non Af Amer 31 (*)  GFR calc Af Amer 36 (*)    All other components within normal limits  CBC WITH DIFFERENTIAL/PLATELET - Abnormal; Notable for the following:    Hemoglobin 11.4 (*)    HCT 35.8 (*)    All other components within normal limits  PROTIME-INR - Abnormal; Notable for the following:    Prothrombin Time 21.2 (*)    All other components within normal limits   Radiology Dg Chest 2 View  Result Date: 05/29/2016 CLINICAL DATA:  Acute onset of hemoptysis.  Initial encounter. EXAM: CHEST  2 VIEW COMPARISON:  Chest radiograph performed 02/06/2015 FINDINGS: The lungs are well-aerated. Peribronchial thickening is noted. Mild bibasilar atelectasis is seen. There is no evidence of pleural effusion or pneumothorax. The heart is normal in size; the mediastinal contour is within normal limits. No acute osseous abnormalities are seen. IMPRESSION: Peribronchial thickening noted.  Mild bibasilar atelectasis seen. Electronically Signed   By: Garald Balding M.D.   On: 05/29/2016 02:57    Procedures Procedures   Medications Ordered in ED Medications  ipratropium-albuterol (DUONEB) 0.5-2.5 (3) MG/3ML nebulizer solution 3 mL (3 mLs Nebulization Given 05/29/16 0300)   Initial Impression /  Assessment and Plan / ED Course  I have reviewed the triage vital signs and the nursing notes.  Pertinent labs & imaging results that were available during my care of the patient were reviewed by me and considered in my medical decision making (see chart for details).  Clinical Course   Episode of hemoptysis. Patient brought in a tissue showing a small amount of pinkish blood on the tissue. It is difficult to assess how much of blood she actually coughed up. Vital signs are normal without evidence of tachycardia or hypotension. She is given a nebulizer treatment with albuterol and ipratropium and following this the lungs are completely clear. Chest x-ray showed no nodules or consolidation. She had no further hemoptysis while in the ED. Orthostatic vital signs showed no significant change in pulse or blood pressure. She is noted to have had a drop in hemoglobin from last year. Elevated creatinine is stable compared with baseline. She is referred to pulmonology for consideration for diagnostic bronchoscopy. Told to return if she has worsening symptoms. Given prescription for albuterol to use as needed.  Final Clinical Impressions(s) / ED Diagnoses   Final diagnoses:  Hemoptysis  Anticoagulated on warfarin  Subtherapeutic international normalized ratio (INR)  Renal insufficiency   New Prescriptions New Prescriptions   ALBUTEROL SULFATE (PROAIR RESPICLICK) 409 (90 BASE) MCG/ACT AEPB    Inhale 2 puffs into the lungs every 4 (four) hours as needed (cough, dyspnea or wheezing).    I personally performed the services described in this documentation, which was scribed in my presence. The recorded information has been reviewed and is accurate.      Delora Fuel, MD 81/19/14 [id number]

## 2016-05-30 ENCOUNTER — Telehealth: Payer: Self-pay | Admitting: *Deleted

## 2016-05-30 NOTE — Telephone Encounter (Signed)
Spoke with pt.  Came to [facility] ED yesterday after coughing up blood.  CXR clear.  INR 1.8  Pt referred to [facility] MD in [facility] for bronchoscopy.  Told pt to take extra 1/2 tablet of coumadin today then resume regular dose.  Has INR appt in 1 wk.

## 2016-06-01 ENCOUNTER — Inpatient Hospital Stay: Payer: PPO | Admitting: Pulmonary Disease

## 2016-06-02 ENCOUNTER — Ambulatory Visit (INDEPENDENT_AMBULATORY_CARE_PROVIDER_SITE_OTHER): Payer: PPO | Admitting: Pulmonary Disease

## 2016-06-02 ENCOUNTER — Encounter: Payer: Self-pay | Admitting: Pulmonary Disease

## 2016-06-02 ENCOUNTER — Ambulatory Visit (HOSPITAL_BASED_OUTPATIENT_CLINIC_OR_DEPARTMENT_OTHER)
Admission: RE | Admit: 2016-06-02 | Discharge: 2016-06-02 | Disposition: A | Discharge status: Home / Self Care | Payer: PPO | Source: Ambulatory Visit | Attending: Pulmonary Disease | Admitting: Pulmonary Disease

## 2016-06-02 VITALS — BP 135/72 | HR 60 | Temp 97.8°F | Ht 64.0 in | Wt 154.0 lb

## 2016-06-02 DIAGNOSIS — R918 Other nonspecific abnormal finding of lung field: Secondary | ICD-10-CM | POA: Insufficient documentation

## 2016-06-02 DIAGNOSIS — R042 Hemoptysis: Secondary | ICD-10-CM | POA: Diagnosis not present

## 2016-06-02 DIAGNOSIS — J189 Pneumonia, unspecified organism: Secondary | ICD-10-CM | POA: Insufficient documentation

## 2016-06-02 DIAGNOSIS — J449 Chronic obstructive pulmonary disease, unspecified: Secondary | ICD-10-CM

## 2016-06-02 NOTE — Assessment & Plan Note (Addendum)
-  While on chronic anticoagulation Most times this is related to acute infection/inflammation and is self-limited  CT chest without contrast -given her long history of smoking If this is normal, may consider ENT exam given her history of nasal bleeding Stay on same dose of Coumadin -if bleeding increases, we may have to stop Coumadin  Let your Coumadin nurse know to aim for INR of 2 Call if bleeding is worse, will hold off bronchoscopy for now

## 2016-06-02 NOTE — Progress Notes (Signed)
Subjective:    Patient ID: Renee Vance, female    DOB: [birth year], 75 y.o.   MRN: [id number]  HPI  Chief Complaint  Patient presents with  . Advice Only    Referred by Dr. Roxanne Mins; coughing up blood in phlegm, wheezing, CXR came back okay.     75 year old ex-smoker referred for evaluation of hemoptysis from the ED. She smoked more than half pack per day for about 50 years before she quit in 2015- about 30-pack-years. She reports about 5 days ago sudden onset coughing up blood, she felt a tickle in her throat and some wheezing, she went to the ER where her INR was noted to be 1.8, chest x-ray did not show any infiltrates or effusions. Since then, hemoptysis has decreased but still persists about once or twice a day, bright red blood. She denies any further wheezing or change in sputum color. She also reports some nasal bleeding on and off   She has chronic atrial fibrillation for which she takes Coumadin for at least a year, more recently INRs have been in the 2-2.4 a range Spirometry 04/2011 showed ratio 62 with FEV1 of 56%, FVC of 69% and DLCO 73% and corrected for alveolar volume. She was told that she has moderate COPD and was placed on Spiriva with which she is compliant  She denies frequent flares, she reports dyspnea when she walks up and down the stairs    Past Medical History:  Diagnosis Date  . Bradycardia   . CAD (coronary artery disease)   . COPD (chronic obstructive pulmonary disease) ([place])   . Coronary atherosclerosis of native coronary artery 2011  . Dyspnea   . Dyspnea   . Fibrocystic breast   . HTN (hypertension)   . HTN (hypertension)   . Hyperlipidemia   . Hypothyroidism   . Obesity   . Pain in joint      Past Surgical History:  Procedure Laterality Date  . CHOLECYSTECTOMY    . CORONARY ANGIOPLASTY WITH STENT PLACEMENT  2011   Lmain 30-40%, LAD 65-75% (FFR 0.88), CFX 55-60%, RCA 95%>0 w/ 2.5 x 12 mm monorail stent  . TUBAL LIGATION        Allergies  Allergen Reactions  . Ampicillin     Makes sick  . Codeine     Makes sick     Social History   Social History  . Marital status: Married    Spouse name: N/A  . Number of children: N/A  . Years of education: N/A   Occupational History  . Not on file.   Social History Main Topics  . Smoking status: Former Smoker    Quit date: 04/10/2014  . Smokeless tobacco: Never Used     Comment: june 2015  . Alcohol use No  . Drug use: Unknown  . Sexual activity: Not on file   Other Topics Concern  . Not on file   Social History Narrative  . No narrative on file     Family History  Problem Relation Age of Onset  . Heart disease Mother        Review of Systems  Constitutional: Negative for chills, fever and unexpected weight change.  HENT: Positive for nosebleeds. Negative for congestion, dental problem, ear pain, postnasal drip, rhinorrhea, sinus pressure, sneezing, sore throat, trouble swallowing and voice change.   Eyes: Negative for visual disturbance.  Respiratory: Positive for cough, shortness of breath and wheezing. Negative for choking.   Cardiovascular: Negative for chest  pain and leg swelling.  Gastrointestinal: Negative for abdominal pain, diarrhea and vomiting.  Genitourinary: Negative for difficulty urinating.  Musculoskeletal: Negative for arthralgias.  Skin: Negative for rash.  Neurological: Negative for tremors, syncope and headaches.  Hematological: Does not bruise/bleed easily.       Objective:   Physical Exam  Gen. Pleasant, well-nourished, in no distress ENT - no lesions, no post nasal drip Neck: No JVD, no thyromegaly, no carotid bruits Lungs: no use of accessory muscles, no dullness to percussion, decreased without rales or rhonchi  Cardiovascular: Rhythm regular, heart sounds  normal, no murmurs or gallops, no peripheral edema Musculoskeletal: No deformities, no cyanosis or clubbing         Assessment & Plan:

## 2016-06-02 NOTE — Patient Instructions (Signed)
CT chest without contrast Stay on same dose of Coumadin -if bleeding increases, we may have to stop Coumadin  Let your Coumadin nurse know to aim for INR of 2 Call if bleeding is worse

## 2016-06-02 NOTE — Assessment & Plan Note (Signed)
Continue Spiriva for now Spirometry in the future

## 2016-06-03 ENCOUNTER — Telehealth: Payer: Self-pay | Admitting: Pulmonary Disease

## 2016-06-03 DIAGNOSIS — J449 Chronic obstructive pulmonary disease, unspecified: Secondary | ICD-10-CM

## 2016-06-03 NOTE — Telephone Encounter (Signed)
Pt returned called. Reviewed RA's results and recs. Pt voiced understanding and had no further questions. Order placed for PET.

## 2016-06-03 NOTE — Telephone Encounter (Signed)
Called spoke with pt. She states that she had a CT done on 06/02/16 and is calling RA's results and recs. I explained to her that we have not received them and that I would send a message to him to address results. She voiced understanding and had no further questions.   RA please advise on CT results

## 2016-06-03 NOTE — Telephone Encounter (Signed)
Result Notes   Notes Recorded by [name], MD on 06/03/2016 at 2:58 PM EDT Discussed results with pt STOP coumadin Bronchoscopy scheduled for 8am Tuesday 8/29 @ [address] schedule PET scan   ATC pt.  Received busy tone x 2

## 2016-06-03 NOTE — Telephone Encounter (Signed)
Pl see result note

## 2016-06-06 ENCOUNTER — Telehealth: Payer: Self-pay | Admitting: *Deleted

## 2016-06-06 ENCOUNTER — Encounter (HOSPITAL_COMMUNITY): Payer: Self-pay

## 2016-06-06 ENCOUNTER — Telehealth: Payer: Self-pay | Admitting: Pulmonary Disease

## 2016-06-06 DIAGNOSIS — R911 Solitary pulmonary nodule: Secondary | ICD-10-CM

## 2016-06-06 NOTE — Telephone Encounter (Signed)
Spoke with pt's spouse, states that pt has received all of the info she needs regarding her procedure tomorrow, and that nothing further is needed.  Will close encounter.

## 2016-06-06 NOTE — Telephone Encounter (Signed)
-----   Message from [name], MD sent at 06/03/2016  2:58 PM EDT ----- Discussed results with pt STOP coumadin Bronchoscopy scheduled for 8am Tuesday 8/29 @ [address] schedule PET scan

## 2016-06-07 ENCOUNTER — Ambulatory Visit (HOSPITAL_COMMUNITY)
Admission: RE | Admit: 2016-06-07 | Discharge: 2016-06-07 | Disposition: A | Discharge status: Home / Self Care | Payer: PPO | Source: Ambulatory Visit | Attending: Pulmonary Disease | Admitting: Pulmonary Disease

## 2016-06-07 ENCOUNTER — Encounter (HOSPITAL_COMMUNITY)
Admission: RE | Disposition: A | Discharge status: Home / Self Care | Payer: Self-pay | Source: Ambulatory Visit | Attending: Pulmonary Disease

## 2016-06-07 DIAGNOSIS — I2510 Atherosclerotic heart disease of native coronary artery without angina pectoris: Secondary | ICD-10-CM | POA: Insufficient documentation

## 2016-06-07 DIAGNOSIS — J449 Chronic obstructive pulmonary disease, unspecified: Secondary | ICD-10-CM | POA: Diagnosis not present

## 2016-06-07 DIAGNOSIS — I10 Essential (primary) hypertension: Secondary | ICD-10-CM | POA: Insufficient documentation

## 2016-06-07 DIAGNOSIS — Z955 Presence of coronary angioplasty implant and graft: Secondary | ICD-10-CM | POA: Diagnosis not present

## 2016-06-07 DIAGNOSIS — R918 Other nonspecific abnormal finding of lung field: Secondary | ICD-10-CM

## 2016-06-07 DIAGNOSIS — Z87891 Personal history of nicotine dependence: Secondary | ICD-10-CM | POA: Insufficient documentation

## 2016-06-07 DIAGNOSIS — Z7901 Long term (current) use of anticoagulants: Secondary | ICD-10-CM | POA: Insufficient documentation

## 2016-06-07 DIAGNOSIS — C3490 Malignant neoplasm of unspecified part of unspecified bronchus or lung: Secondary | ICD-10-CM | POA: Diagnosis not present

## 2016-06-07 DIAGNOSIS — I482 Chronic atrial fibrillation: Secondary | ICD-10-CM | POA: Diagnosis not present

## 2016-06-07 DIAGNOSIS — C3412 Malignant neoplasm of upper lobe, left bronchus or lung: Secondary | ICD-10-CM | POA: Diagnosis not present

## 2016-06-07 DIAGNOSIS — R042 Hemoptysis: Secondary | ICD-10-CM | POA: Diagnosis not present

## 2016-06-07 HISTORY — PX: VIDEO BRONCHOSCOPY: SHX5072

## 2016-06-07 SURGERY — BRONCHOSCOPY, WITH FLUOROSCOPY
Anesthesia: Moderate Sedation | Laterality: Bilateral

## 2016-06-07 MED ORDER — LIDOCAINE HCL 1 % IJ SOLN
INTRAMUSCULAR | Status: DC | PRN
  Administered 2016-06-07: 6 mL via RESPIRATORY_TRACT

## 2016-06-07 MED ORDER — SODIUM CHLORIDE 0.9 % IV SOLN
INTRAVENOUS | Status: DC
  Administered 2016-06-07: 08:00:00 via INTRAVENOUS

## 2016-06-07 MED ORDER — FENTANYL CITRATE (PF) 100 MCG/2ML IJ SOLN
INTRAMUSCULAR | Status: DC | PRN
  Administered 2016-06-07 (×4): 25 ug via INTRAVENOUS

## 2016-06-07 MED ORDER — LIDOCAINE HCL 2 % EX GEL: 1.0000 "application " | Freq: Once | CUTANEOUS | Status: DC

## 2016-06-07 MED ORDER — LIDOCAINE HCL 2 % EX GEL
CUTANEOUS | Status: DC | PRN
  Administered 2016-06-07: 1

## 2016-06-07 MED ORDER — FENTANYL CITRATE (PF) 100 MCG/2ML IJ SOLN
INTRAMUSCULAR | Status: AC
  Filled 2016-06-07: qty 4

## 2016-06-07 MED ORDER — SODIUM CHLORIDE 0.9 % IV SOLN: Freq: Once | INTRAVENOUS | Status: DC

## 2016-06-07 MED ORDER — MIDAZOLAM HCL 5 MG/ML IJ SOLN
INTRAMUSCULAR | Status: AC
  Filled 2016-06-07: qty 2

## 2016-06-07 MED ORDER — MIDAZOLAM HCL 10 MG/2ML IJ SOLN
INTRAMUSCULAR | Status: DC | PRN
  Administered 2016-06-07 (×3): 1 mg via INTRAVENOUS

## 2016-06-07 MED ORDER — BUTAMBEN-TETRACAINE-BENZOCAINE 2-2-14 % EX AERO: 1.0000 | INHALATION_SPRAY | Freq: Once | CUTANEOUS | Status: DC

## 2016-06-07 MED ORDER — PHENYLEPHRINE HCL 0.25 % NA SOLN: 1.0000 | Freq: Four times a day (QID) | NASAL | Status: DC | PRN

## 2016-06-07 MED ORDER — PHENYLEPHRINE HCL 0.25 % NA SOLN
NASAL | Status: DC | PRN
  Administered 2016-06-07: 2 via NASAL

## 2016-06-07 NOTE — Progress Notes (Signed)
Video bronchoscopy performed Intervention bronchial washings Intervention bronchial brushings Intervention bronchial biopsies Pt tolerated well  Mirai Greenwood David RRT  

## 2016-06-07 NOTE — H&P (View-Only) (Signed)
Subjective:    Patient ID: Renee Vance, female    DOB: [birth year], 75 y.o.   MRN: [id number]  HPI  Chief Complaint  Patient presents with  . Advice Only    Referred by Dr. Roxanne Mins; coughing up blood in phlegm, wheezing, CXR came back okay.     75 year old ex-smoker referred for evaluation of hemoptysis from the ED. She smoked more than half pack per day for about 50 years before she quit in 2015- about 30-pack-years. She reports about 5 days ago sudden onset coughing up blood, she felt a tickle in her throat and some wheezing, she went to the ER where her INR was noted to be 1.8, chest x-ray did not show any infiltrates or effusions. Since then, hemoptysis has decreased but still persists about once or twice a day, bright red blood. She denies any further wheezing or change in sputum color. She also reports some nasal bleeding on and off   She has chronic atrial fibrillation for which she takes Coumadin for at least a year, more recently INRs have been in the 2-2.4 a range Spirometry 04/2011 showed ratio 62 with FEV1 of 56%, FVC of 69% and DLCO 73% and corrected for alveolar volume. She was told that she has moderate COPD and was placed on Spiriva with which she is compliant  She denies frequent flares, she reports dyspnea when she walks up and down the stairs    Past Medical History:  Diagnosis Date  . Bradycardia   . CAD (coronary artery disease)   . COPD (chronic obstructive pulmonary disease) ([place])   . Coronary atherosclerosis of native coronary artery 2011  . Dyspnea   . Dyspnea   . Fibrocystic breast   . HTN (hypertension)   . HTN (hypertension)   . Hyperlipidemia   . Hypothyroidism   . Obesity   . Pain in joint      Past Surgical History:  Procedure Laterality Date  . CHOLECYSTECTOMY    . CORONARY ANGIOPLASTY WITH STENT PLACEMENT  2011   [place] 30-40%, LAD 65-75% (FFR 0.88), CFX 55-60%, RCA 95%>0 w/ 2.5 x 12 mm monorail stent  . TUBAL LIGATION        Allergies  Allergen Reactions  . Ampicillin     Makes sick  . Codeine     Makes sick     Social History   Social History  . Marital status: Married    Spouse name: N/A  . Number of children: N/A  . Years of education: N/A   Occupational History  . Not on file.   Social History Main Topics  . Smoking status: Former Smoker    Quit date: 04/10/2014  . Smokeless tobacco: Never Used     Comment: june 2015  . Alcohol use No  . Drug use: Unknown  . Sexual activity: Not on file   Other Topics Concern  . Not on file   Social History Narrative  . No narrative on file     Family History  Problem Relation Age of Onset  . Heart disease Mother        Review of Systems  Constitutional: Negative for chills, fever and unexpected weight change.  HENT: Positive for nosebleeds. Negative for congestion, dental problem, ear pain, postnasal drip, rhinorrhea, sinus pressure, sneezing, sore throat, trouble swallowing and voice change.   Eyes: Negative for visual disturbance.  Respiratory: Positive for cough, shortness of breath and wheezing. Negative for choking.   Cardiovascular: Negative for chest  pain and leg swelling.  Gastrointestinal: Negative for abdominal pain, diarrhea and vomiting.  Genitourinary: Negative for difficulty urinating.  Musculoskeletal: Negative for arthralgias.  Skin: Negative for rash.  Neurological: Negative for tremors, syncope and headaches.  Hematological: Does not bruise/bleed easily.       Objective:   Physical Exam  Gen. Pleasant, well-nourished, in no distress ENT - no lesions, no post nasal drip Neck: No JVD, no thyromegaly, no carotid bruits Lungs: no use of accessory muscles, no dullness to percussion, decreased without rales or rhonchi  Cardiovascular: Rhythm regular, heart sounds  normal, no murmurs or gallops, no peripheral edema Musculoskeletal: No deformities, no cyanosis or clubbing         Assessment & Plan:

## 2016-06-07 NOTE — Op Note (Signed)
[facility] Cardiopulmonary Patient Name: Renee Vance Procedure Date: 06/07/2016 MRN: [id number] Attending MD: Leanna Sato. Elsworth Soho MD, MD Date of Birth: 1941/08/27 CSN: [id number] Age: 75 Admit Type: Outpatient Ethnicity: Not Hispanic or Latino Procedure:            Bronchoscopy Indications:          Left upper lobe mass, Hemoptysis with abnormal CXR Providers:            Leanna Sato. Elsworth Soho MD, MD, Cherre Huger RRT, RCP, Ashley Mariner RRT,RCP Referring MD:          Medicines:            Lidocaine applied to nares and subglottic space,                        Fentanyl 100 mcg IV, Midazolam 3 mg mg IV, Lidocaine 2%                        applied to cords 4 mL Complications:        No immediate complications. Estimated blood loss:                        Minimal Estimated Blood Loss: Estimated blood loss was minimal. Procedure:      Pre-Anesthesia Assessment:      - A History and Physical has been performed. Patient meds and allergies       have been reviewed. The risks and benefits of the procedure and the       sedation options and risks were discussed with the patient. All       questions were answered and informed consent was obtained. Patient       identification and proposed procedure were verified prior to the       procedure by the physician in the procedure room. Mental Status       Examination: alert and oriented. Airway Examination: normal       oropharyngeal airway. Respiratory Examination: clear to auscultation.       ASA Grade Assessment: II - A patient with mild systemic disease. After       reviewing the risks and benefits, the patient was deemed in satisfactory       condition to undergo the procedure. The anesthesia plan was to use       moderate sedation / analgesia (conscious sedation). Immediately prior to       administration of medications, the patient was re-assessed for adequacy       to receive sedatives. The heart rate,  respiratory rate, oxygen       saturations, blood pressure, adequacy of pulmonary ventilation, and       response to care were monitored throughout the procedure. The physical       status of the patient was re-assessed after the procedure.      After obtaining informed consent, the bronchoscope was passed under       direct vision. Throughout the procedure, the patient's blood pressure,       pulse, and oxygen saturations were monitored continuously. the [id number]       [id number] scope was introduced through the left nostril and advanced to       the tracheobronchial tree. The patient tolerated  the procedure well. Findings:      Respiratory tract: The larynx is normal. The vocal cords appear normal.       The subglottic space is normal. The trachea is of normal caliber. The       carina is sharp. The entire tracheobronchial tree was examined to at       least the first subsegmental level. Bronchial mucosa and anatomy are       normal; there was an endobronchial lesion in the superior lingular       segment of the left upper lobe.      Brushings of a lesion were obtained in the superior lingula segment of       the left upper lobe with a cytology brush and sent for cell count,       bacterial culture, viral smears & culture, and fungal & AFB analysis and       cytology. Two samples were obtained.      Washings were obtained in the superior lingula segment of the left upper       lobe and sent for cell count, bacterial culture, viral smears & culture,       and fungal & AFB analysis and cytology. The return was blood-tinged.      Endobronchial biopsies of a mass were performed in the superior lingula       segment of the left upper lobe using a forceps and sent for       histopathology examination. Five samples were obtained. Impression:      - Left upper lobe mass      - Hemoptysis with abnormal CXR      - Brushings were obtained. Moderate Sedation:      Moderate (conscious) sedation was  personally administered by the       endoscopist. The following parameters were monitored: oxygen saturation,       heart rate, blood pressure, and response to care. Total physician       intraservice time was 15 minutes. Recommendation:      - Await BAL, biopsy and brushing results. Procedure Code(s):      --- Professional ---      [phone], Bronchoscopy, rigid or flexible, including fluoroscopic guidance,       when performed; with brushing or protected brushings      99152, Moderate sedation services provided by the same physician or       other qualified health care professional performing the diagnostic or       therapeutic service that the sedation supports, requiring the presence       of an independent trained observer to assist in the monitoring of the       patient's level of consciousness and physiological status; initial 15       minutes of intraservice time, patient age 49 years or older Diagnosis Code(s):      --- Professional ---      R91.8, Other nonspecific abnormal finding of lung field      R04.2, Hemoptysis CPT copyright 2016 American [employer] Association. All rights reserved. The codes documented in this report are preliminary and upon coder review may  be revised to meet current compliance requirements. Kara Mead, MD Leanna Sato Elsworth Soho MD, MD 06/07/2016 9:14:21 AM This report has been signed electronically. Number of Addenda: 0 Scope In: 8:41:45 AM Scope Out: 9:00:11 AM

## 2016-06-07 NOTE — Discharge Instructions (Signed)
Flexible Bronchoscopy, Care After These instructions give you information on caring for yourself after your procedure. Your doctor may also give you more specific instructions. Call your doctor if you have any problems or questions after your procedure. HOME CARE  Do not eat or drink anything for 2 hours after your procedure. If you try to eat or drink before the medicine wears off, food or drink could go into your lungs. You could also burn yourself.  After 2 hours have passed and when you can cough and gag normally, you may eat soft food and drink liquids slowly.  The day after the test, you may eat your normal diet.  You may do your normal activities.  Keep all doctor visits. GET HELP RIGHT AWAY IF:  You get more and more short of breath.  You get light-headed.  You feel like you are going to pass out (faint).  You have chest pain.  You have new problems that worry you.  You cough up more than a little blood.  You cough up more blood than before. MAKE SURE YOU:  Understand these instructions.  Will watch your condition.  Will get help right away if you are not doing well or get worse.   This information is not intended to replace advice given to you by your health care provider. Make sure you discuss any questions you have with your health care provider.   Document Released: 07/24/2009 Document Revised: 10/01/2013 Document Reviewed: 05/31/2013 Elsevier Interactive Patient Education 2016 [place] not eat or drink until after 1045 today 06/07/16

## 2016-06-07 NOTE — Interval H&P Note (Signed)
History and Physical Interval Note:  06/07/2016 8:33 AM  Renee Vance  has presented today for surgery, with the diagnosis of Hemoptysis, lung mass   The various methods of treatment have been discussed with the patient and family. After consideration of risks, benefits and other options for treatment, the patient has consented to  Procedure(s): VIDEO BRONCHOSCOPY WITH FLUORO (Bilateral) as a surgical intervention .  The patient's history has been reviewed, patient examined, no change in status, stable for surgery.  I have reviewed the patient's chart and labs.  Questions were answered to the patient's satisfaction.     ALVA,RAKESH V.

## 2016-06-08 ENCOUNTER — Telehealth: Payer: Self-pay | Admitting: *Deleted

## 2016-06-08 ENCOUNTER — Encounter (HOSPITAL_COMMUNITY): Payer: Self-pay | Admitting: Pulmonary Disease

## 2016-06-08 DIAGNOSIS — R918 Other nonspecific abnormal finding of lung field: Secondary | ICD-10-CM

## 2016-06-08 LAB — ACID FAST SMEAR (AFB): ACID FAST SMEAR - AFSCU2: NEGATIVE

## 2016-06-08 NOTE — Telephone Encounter (Signed)
Patient would like to speak with you in regards to why she had to cancel her appointment last week. / tg

## 2016-06-08 NOTE — Telephone Encounter (Signed)
-----   Message from [name], MD sent at 06/08/2016 11:43 AM EDT ----- Pl make appt with [facility] - PET scan pending Pl arrange for PFTs  Next week I will inform pt of results

## 2016-06-08 NOTE — Telephone Encounter (Signed)
Oncology Nurse Navigator Documentation  Oncology Nurse Navigator Flowsheets 06/08/2016  Navigator Encounter Type Introductory phone call;Telephone/I received referral on [name] today.  I spoke with Dr. Julien Nordmann and he asked that I reach out to Dr. Elsworth Soho on who to see at clinic.  I contacted him. He stated possible T surgery but wait until PET scan is completed.  I called and spoke with Renee Vance.  I gave her an appt to see T-surgery on 06/23/16 with the possibility it may change.  She verbalized understanding of appt time and place.   Treatment Phase Pre-Tx/Tx Discussion  Barriers/Navigation Needs Coordination of Care  Interventions Coordination of Care  Coordination of Care Appts  Acuity Level 2  Acuity Level 2 Assistance expediting appointments  Time Spent with Patient 62

## 2016-06-08 NOTE — Telephone Encounter (Signed)
Spoke with patient.  She was calling to inform me that she is off coumadin for now.  Had a bronchoscopy yesterday on Lt upper lobe lung mass.  She has been off coumadin x 1 wk.  Told pt to ask Dr Elsworth Soho when he calls her with test results when she can resume it.  She will call his office Friday if she has not heard from him before then.  She will let me know when she starts back on coumadin.

## 2016-06-09 LAB — CULTURE, BAL-QUANTITATIVE W GRAM STAIN

## 2016-06-09 LAB — CULTURE, BAL-QUANTITATIVE: CULTURE: NORMAL — AB

## 2016-06-14 ENCOUNTER — Telehealth: Payer: Self-pay | Admitting: *Deleted

## 2016-06-14 ENCOUNTER — Telehealth: Payer: Self-pay | Admitting: Pulmonary Disease

## 2016-06-14 NOTE — Telephone Encounter (Signed)
Spoke with pt.  She has been cleared by pulmonary to restart coumadin (keep at lower end of range 2.0)  Told pt to restart coumadin at a decrease dose of 2.'5mg'$  daily except '5mg'$  on Tuesdays and Saturdays and check INR on 06/21/16.

## 2016-06-14 NOTE — Telephone Encounter (Signed)
Spoke with pt, wanting to know why she has an appt with the cancer center on 9/14.  I advised pt that per her chart I do not see an appt scheduled for 9/14.  Pt insisted that she has an appt on 9/14 at the cancer center in [facility].  I apologized for the confusion.  Pt states she will call the [facility] to see if she does in fact have this appt since it is not visible in her chart.  Nothing further needed.

## 2016-06-14 NOTE — Telephone Encounter (Signed)
Patient asked that you return her call

## 2016-06-17 ENCOUNTER — Telehealth: Payer: Self-pay | Admitting: *Deleted

## 2016-06-17 ENCOUNTER — Encounter (HOSPITAL_COMMUNITY): Payer: PPO

## 2016-06-17 ENCOUNTER — Encounter (HOSPITAL_COMMUNITY)
Admission: RE | Admit: 2016-06-17 | Discharge: 2016-06-17 | Disposition: A | Discharge status: Home / Self Care | Payer: PPO | Source: Ambulatory Visit | Attending: Pulmonary Disease | Admitting: Pulmonary Disease

## 2016-06-17 DIAGNOSIS — R918 Other nonspecific abnormal finding of lung field: Secondary | ICD-10-CM | POA: Diagnosis not present

## 2016-06-17 DIAGNOSIS — J449 Chronic obstructive pulmonary disease, unspecified: Secondary | ICD-10-CM

## 2016-06-17 LAB — GLUCOSE, CAPILLARY: GLUCOSE-CAPILLARY: 110 mg/dL — AB (ref 65–99)

## 2016-06-17 MED ORDER — FLUDEOXYGLUCOSE F - 18 (FDG) INJECTION: 7.7000 | Freq: Once | INTRAVENOUS | Status: DC | PRN

## 2016-06-17 NOTE — Telephone Encounter (Signed)
Mailed clinic letter to pt.  

## 2016-06-20 ENCOUNTER — Telehealth: Payer: Self-pay | Admitting: Pulmonary Disease

## 2016-06-20 NOTE — Telephone Encounter (Signed)
Notes Recorded by [name], MD on 06/20/2016 at 12:05 PM EDT Scan did not show spread of cancer. The lung mass lighted up-no other lymph gland enlargement noted. Depending on lung function (PFTs) , she needs to see thoracic surgery -as we have discussed. --------------------------------------------------------------------------- Spoke with pt. She is aware of results. Nothing further was needed.

## 2016-06-21 ENCOUNTER — Ambulatory Visit (INDEPENDENT_AMBULATORY_CARE_PROVIDER_SITE_OTHER): Payer: PPO | Admitting: *Deleted

## 2016-06-21 DIAGNOSIS — I480 Paroxysmal atrial fibrillation: Secondary | ICD-10-CM

## 2016-06-21 DIAGNOSIS — Z5181 Encounter for therapeutic drug level monitoring: Secondary | ICD-10-CM

## 2016-06-21 LAB — POCT INR: INR: 1.4

## 2016-06-23 ENCOUNTER — Encounter: Payer: Self-pay | Admitting: Internal Medicine

## 2016-06-23 ENCOUNTER — Ambulatory Visit
Admission: RE | Admit: 2016-06-23 | Discharge: 2016-06-23 | Disposition: A | Discharge status: Home / Self Care | Payer: PPO | Source: Ambulatory Visit | Attending: Radiation Oncology | Admitting: Radiation Oncology

## 2016-06-23 ENCOUNTER — Institutional Professional Consult (permissible substitution) (INDEPENDENT_AMBULATORY_CARE_PROVIDER_SITE_OTHER): Payer: PPO | Admitting: Thoracic Surgery (Cardiothoracic Vascular Surgery)

## 2016-06-23 ENCOUNTER — Ambulatory Visit (HOSPITAL_BASED_OUTPATIENT_CLINIC_OR_DEPARTMENT_OTHER): Payer: PPO | Admitting: Internal Medicine

## 2016-06-23 ENCOUNTER — Telehealth: Payer: Self-pay | Admitting: *Deleted

## 2016-06-23 ENCOUNTER — Ambulatory Visit: Payer: PPO | Attending: Radiation Oncology | Admitting: Physical Therapy

## 2016-06-23 ENCOUNTER — Encounter: Payer: Self-pay | Admitting: Thoracic Surgery (Cardiothoracic Vascular Surgery)

## 2016-06-23 DIAGNOSIS — C3492 Malignant neoplasm of unspecified part of left bronchus or lung: Secondary | ICD-10-CM | POA: Diagnosis not present

## 2016-06-23 DIAGNOSIS — I10 Essential (primary) hypertension: Secondary | ICD-10-CM | POA: Diagnosis not present

## 2016-06-23 DIAGNOSIS — Z87891 Personal history of nicotine dependence: Secondary | ICD-10-CM

## 2016-06-23 DIAGNOSIS — C3412 Malignant neoplasm of upper lobe, left bronchus or lung: Secondary | ICD-10-CM

## 2016-06-23 DIAGNOSIS — M545 Low back pain: Secondary | ICD-10-CM | POA: Diagnosis not present

## 2016-06-23 DIAGNOSIS — I4891 Unspecified atrial fibrillation: Secondary | ICD-10-CM | POA: Diagnosis not present

## 2016-06-23 DIAGNOSIS — Z5111 Encounter for antineoplastic chemotherapy: Secondary | ICD-10-CM

## 2016-06-23 DIAGNOSIS — M542 Cervicalgia: Secondary | ICD-10-CM

## 2016-06-23 DIAGNOSIS — R262 Difficulty in walking, not elsewhere classified: Secondary | ICD-10-CM

## 2016-06-23 DIAGNOSIS — F17211 Nicotine dependence, cigarettes, in remission: Secondary | ICD-10-CM | POA: Diagnosis not present

## 2016-06-23 DIAGNOSIS — J449 Chronic obstructive pulmonary disease, unspecified: Secondary | ICD-10-CM

## 2016-06-23 HISTORY — DX: Malignant neoplasm of unspecified part of left bronchus or lung: C34.92

## 2016-06-23 HISTORY — DX: Encounter for antineoplastic chemotherapy: Z51.11

## 2016-06-23 MED ORDER — PROCHLORPERAZINE MALEATE 10 MG PO TABS: 10.0000 mg | ORAL_TABLET | Freq: Four times a day (QID) | ORAL | 0 refills | Status: DC | PRN

## 2016-06-23 NOTE — Telephone Encounter (Signed)
Oncology Nurse Navigator Documentation  Oncology Nurse Navigator Flowsheets 06/23/2016  Navigator Encounter Type Telephone/per Cancer Conference this am, it was discussed that Renee Vance needed to be seen by Dr. Julien Nordmann, Dr. Sondra Come, and Dr. Roxan Hockey today.  I updated her.  She will arrive at 3:15 today.    Telephone Outgoing Call  Treatment Phase Pre-Tx/Tx Discussion  Barriers/Navigation Needs Coordination of Care;Education  Education Other  Interventions Coordination of Care;Education Method  Coordination of Care Appts  Education Method Verbal  Acuity Level 2  Acuity Level 2 Assistance expediting appointments;Educational needs  Time Spent with Patient 15

## 2016-06-23 NOTE — Progress Notes (Signed)
START ON PATHWAY REGIMEN - Non-Small Cell Lung  [id number]: Carboplatin AUC=2 + Paclitaxel 45 mg/m2 Weekly During Radiation   Administer weekly:     Paclitaxel (Taxol(R)) 45 mg/m2 in 250 mL NS IV over 1 hour followed by Dose Mod: None     Carboplatin (Paraplatin(R)) AUC=2 in 100 mL NS IV over 30 minutes Dose Mod: None Additional Orders: * All AUC calculations intended to be used in [employer] formula  **Always confirm dose/schedule in your pharmacy ordering system**    Patient Characteristics: Stage II - Unresectable AJCC M Stage: 0 AJCC N Stage: 1 AJCC T Stage: 2a Current Disease Status: No Distant Metastases or Local Recurrence AJCC Stage Grouping: IIA  Intent of Therapy: Curative Intent, Discussed with Patient

## 2016-06-23 NOTE — Progress Notes (Signed)
[place] Telephone:([phone]) [phone]   Fax:([phone] Multidisciplinary thoracic oncology clinic  CONSULT NOTE  REFERRING PHYSICIAN: Dr. Kara Mead  REASON FOR CONSULTATION:  75 years old white female recently diagnosed with lung cancer.  HPI Renee Vance is a 75 y.o. female was past medical history significant for hypertension, coronary artery disease, atrial fibrillation, COPD, hypothyroidism, dyslipidemia as well as long history of smoking but quit 2 years ago. The patient presented to the emergency department at [facility] on 05/29/2016 complaining of coughing blood as well as wheezes. She has been on Coumadin for atrial fibrillation which was discontinued. Chest x-ray on 05/29/2016 showed peribronchial thickening. This was followed by CT scan of the chest without contrast on 06/02/2016 and it showed left hilar mass measuring 1.9 x 3.3 cm and obstructing the lingular bronchus. There was also associated postobstructive pneumonitis involving the lingula. The patient was seen by Dr. Elsworth Soho and on 06/07/2016 she underwent bronchoscopy and it showed an endobronchial lesion in the superior lingular segment of the left upper lobe. Endobronchial biopsy was performed and the final pathology (Accession: [id number]) was consistent with squamous cell carcinoma. The patient also had a PET scan on 06/17/2016 and it showed 3.4 cm mass in the central left upper lobe abuts the left hilum and causes obstruction of the central lingular bronchus. With SUV max of 3.0. This causes postobstructive pneumonitis in the peripheral lingula. There was no hypermetabolic mediastinal or right hilar lymph nodes and no other suspicious pulmonary nodules or evidence of metastatic disease. Dr. Elsworth Soho kindly referred the patient to the multidisciplinary thoracic oncology clinic today for further evaluation and recommendation regarding treatment of her condition. When seen today the patient continues to  complain of shortness breath and cough productive of whitish sputum. She has no more hemoptysis. She denied having any significant chest pain. She has no significant weight loss or night sweats. She has no nausea, vomiting, diarrhea or constipation. She denied having any headache or visual changes. Family history significant for mother died from kidney disease and father from heart disease. The patient is married and has 3 children. She was accompanied today by her husband Gwyndolyn Saxon. She used to work as a Research scientist (physical sciences). She has a history of smoking 1 pack per day for around 61 years and quit 2 years ago. She has no history of alcohol or drug abuse.  HPI  Past Medical History:  Diagnosis Date  . Bradycardia   . CAD (coronary artery disease)   . COPD (chronic obstructive pulmonary disease) ([place])   . Coronary atherosclerosis of native coronary artery 2011  . Dyspnea   . Dyspnea   . Encounter for antineoplastic chemotherapy 06/23/2016  . Fibrocystic breast   . HTN (hypertension)   . HTN (hypertension)   . Hyperlipidemia   . Hypothyroidism   . Obesity   . Pain in joint   . 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 CHOLECYSTECTOMY    . CORONARY ANGIOPLASTY WITH STENT PLACEMENT  2011   Lmain 30-40%, LAD 65-75% (FFR 0.88), CFX 55-60%, RCA 95%>0 w/ 2.5 x 12 mm monorail stent  . TUBAL LIGATION    . VIDEO BRONCHOSCOPY Bilateral 06/07/2016   Procedure: VIDEO BRONCHOSCOPY WITH FLUORO;  Surgeon: [name], MD;  Location: [facility];  Service: Cardiopulmonary;  Laterality: Bilateral;    Family History  Problem Relation Age of Onset  . Heart disease Mother  Social History Social History  Substance Use Topics  . Smoking status: Former Smoker    Packs/day: 0.50    Years: 60.00    Quit date: 04/10/2014  . Smokeless tobacco: Never Used     Comment: june 2015  . Alcohol use No    Allergies  Allergen  Reactions  . Ampicillin     Makes sick  . Codeine     Makes sick    Current Outpatient Prescriptions  Medication Sig Dispense Refill  . amLODipine (NORVASC) 5 MG tablet Take 5 mg by mouth daily.    Marland Kitchen aspirin 81 MG tablet Take 81 mg by mouth daily.    Marland Kitchen atorvastatin (LIPITOR) 80 MG tablet Take 80 mg by mouth daily.    Marland Kitchen levothyroxine (SYNTHROID, LEVOTHROID) 125 MCG tablet Take 125 mcg by mouth daily before breakfast.     . losartan (COZAAR) 100 MG tablet Take 100 mg by mouth daily.    . metoprolol succinate (TOPROL-XL) 50 MG 24 hr tablet Take 1 tablet (50 mg total) by mouth 2 (two) times daily. 180 tablet 1  . tiotropium (SPIRIVA) 18 MCG inhalation capsule Place 18 mcg into inhaler and inhale daily.    . Vitamin D, Cholecalciferol, 1000 UNITS CAPS Take 1,000 Units by mouth daily.     Marland Kitchen warfarin (COUMADIN) 5 MG tablet Take as directed by Coumadin clinic. (Patient taking differently: Take 2.5-5 mg by mouth daily. Take 2.'5mg'$  on Monday, Wednesday, Friday and Saturday. All other days of the week take '5mg'$ ) 90 tablet 3  . prochlorperazine (COMPAZINE) 10 MG tablet Take 1 tablet (10 mg total) by mouth every 6 (six) hours as needed for nausea or vomiting. 30 tablet 0   No current facility-administered medications for this visit.     Review of Systems  Constitutional: positive for fatigue Eyes: negative Ears, nose, mouth, throat, and face: negative Respiratory: positive for cough, dyspnea on exertion and sputum Cardiovascular: negative Gastrointestinal: negative Genitourinary:negative Integument/breast: negative Hematologic/lymphatic: negative Musculoskeletal:negative Neurological: negative Behavioral/Psych: negative Endocrine: negative Allergic/Immunologic: negative  Physical Exam  ERX:VQMGQ, healthy, no distress, well nourished and well developed SKIN: skin color, texture, turgor are normal, no rashes or significant lesions HEAD: Normocephalic, No masses, lesions, tenderness or  abnormalities EYES: normal, PERRLA, Conjunctiva are pink and non-injected EARS: External ears normal, Canals clear OROPHARYNX:no exudate, no erythema and lips, buccal mucosa, and tongue normal  NECK: supple, no adenopathy, no JVD LYMPH:  no palpable lymphadenopathy, no hepatosplenomegaly BREAST:not examined LUNGS: clear to auscultation , and palpation HEART: regular rate & rhythm, no murmurs and no gallops ABDOMEN:abdomen soft, non-tender, normal bowel sounds and no masses or organomegaly BACK: Back symmetric, no curvature., No CVA tenderness EXTREMITIES:no joint deformities, effusion, or inflammation, no edema, no skin discoloration  NEURO: alert & oriented x 3 with fluent speech, no focal motor/sensory deficits  PERFORMANCE STATUS: ECOG 1  LABORATORY DATA: Lab Results  Component Value Date   WBC 8.1 05/29/2016   HGB 11.4 (L) 05/29/2016   HCT 35.8 (L) 05/29/2016   MCV 89.9 05/29/2016   PLT 250 05/29/2016      Chemistry      Component Value Date/Time   NA 138 05/29/2016 0426   K 4.0 05/29/2016 0426   CL 105 05/29/2016 0426   CO2 24 05/29/2016 0426   BUN 30 (H) 05/29/2016 0426   CREATININE 1.55 (H) 05/29/2016 0426      Component Value Date/Time   CALCIUM 9.6 05/29/2016 0426       RADIOGRAPHIC STUDIES:  Dg Chest 2 View  Result Date: 05/29/2016 CLINICAL DATA:  Acute onset of hemoptysis.  Initial encounter. EXAM: CHEST  2 VIEW COMPARISON:  Chest radiograph performed 02/06/2015 FINDINGS: The lungs are well-aerated. Peribronchial thickening is noted. Mild bibasilar atelectasis is seen. There is no evidence of pleural effusion or pneumothorax. The heart is normal in size; the mediastinal contour is within normal limits. No acute osseous abnormalities are seen. IMPRESSION: Peribronchial thickening noted.  Mild bibasilar atelectasis seen. Electronically Signed   By: Garald Balding M.D.   On: 05/29/2016 02:57   Ct Chest Wo Contrast  Result Date: 06/02/2016 CLINICAL DATA:   Coughing up blood since Sunday smoker. EXAM: CT CHEST WITHOUT CONTRAST TECHNIQUE: Multidetector CT imaging of the chest was performed following the standard protocol without IV contrast. COMPARISON:  None. FINDINGS: Cardiovascular: The heart size appears normal. There is aortic atherosclerosis noted. Calcification within the RCA, LAD and left circumflex coronary artery is noted. The trachea appears patent and is midline. Normal appearance of the esophagus. Mediastinum/Nodes: The trachea appears patent and is midline. Normal appearance of the esophagus. Left hilar mass is identified measuring 1.9 x 3.3 cm and is obstructing the lingular bronchus, image 69 of series 5. Lungs/Pleura: Postobstructive pneumonitis of the lingula. Moderate changes of centrilobular and paraseptal emphysema identified. Calcified granuloma noted within the left upper lobe Upper Abdomen: The adrenal glands are normal. Previous cholecystectomy. No focal liver abnormality. The visualized portions of the spleen, adrenal glands and kidneys are unremarkable. Musculoskeletal: Degenerative disc disease is identified throughout the thoracic spine. No aggressive lytic or sclerotic bone lesions. IMPRESSION: 1. Left hilar lung mass is identified and worrisome for primary bronchogenic carcinoma. There is associated postobstructive pneumonitis involving the lingula secondary to obstruction of the lingular bronchus. Further evaluation with PET-CT and tissue sampling is recommended. These results will be called to the ordering clinician or representative by the Radiologist Assistant, and communication documented in the PACS or zVision Dashboard. Electronically Signed   By: Kerby Moors M.D.   On: 06/02/2016 16:24   Nm Pet Image Initial (pi) Skull Base To Thigh  Result Date: 06/17/2016 CLINICAL DATA:  Initial treatment strategy for left upper lobe pulmonary mass. EXAM: NUCLEAR MEDICINE PET SKULL BASE TO THIGH TECHNIQUE: 7.7 mCi F-18 FDG was injected  intravenously. Full-ring PET imaging was performed from the skull base to thigh after the radiotracer. CT data was obtained and used for attenuation correction and anatomic localization. FASTING BLOOD GLUCOSE:  Value: 110 mg/dl COMPARISON:  CT on 06/02/2016 FINDINGS: NECK No hypermetabolic lymph nodes in the neck. CHEST A 3.4 cm mass in the central left upper lobe abuts the left hilum and causes obstruction of the central lingular bronchus. This has SUV max of 30.0. This causes postobstructive pneumonitis in the peripheral lingula. No hypermetabolic mediastinal or right hilar lymph nodes are identified. No other suspicious pulmonary nodules identified by CT. ABDOMEN/PELVIS No abnormal hypermetabolic activity within the liver, pancreas, adrenal glands, or spleen. No hypermetabolic lymph nodes in the abdomen or pelvis. Prior cholecystectomy.  Aortic atherosclerosis. SKELETON No focal hypermetabolic activity to suggest skeletal metastasis. IMPRESSION: 3.4 cm hypermetabolic central left upper lobe mass which involves the left hilum, consistent with primary bronchogenic carcinoma. This causes postobstructive pneumonitis within the lingula. No other hypermetabolic thoracic lymphadenopathy or distant metastatic disease. Electronically Signed   By: Earle Gell M.D.   On: 06/17/2016 14:12    ASSESSMENT: This is a very pleasant 75 years old white female recently diagnosed with a stage IIIa (T2a,  N1, M0) non-small cell lung cancer, squamous cell carcinoma presented with left hilar mass diagnosed in August 2017.   PLAN: I had a lengthy discussion with the patient and her family today about her current disease stage, prognosis and treatment options. I recommended for the patient to complete the staging workup by ordering a MRI of the brain to rule out brain metastasis. I explained to the patient that she could be potentially resectable but at this point she may require left pneumonectomy as indicated by Dr.  Roxan Hockey. Her case was discussed at the weekly thoracic conference. It was recommended that the patient may benefit from a short course of neoadjuvant concurrent chemoradiation before consideration of surgical resection. I discussed this option with the patient. I recommended for the patient a course of concurrent chemoradiation with weekly carboplatin for AUC of 2 and paclitaxel 45 MG/M2. I discussed with the patient adverse effect of the chemotherapy including but not limited to alopecia, myelosuppression, nausea and vomiting, peripheral neuropathy, liver or renal dysfunction. I will arrange for the patient to have a chemotherapy education class before starting the first dose of his chemotherapy. She is expected to start the first dose of this treatment on 07/04/2016. I will call her pharmacy withs prescription for Compazine 10 mg by mouth every 6 hours as needed for nausea. The patient was seen during the multidisciplinary thoracic oncology clinic today by medical oncology, radiation oncology, thoracic surgery, thoracic navigator, social worker and physical therapist. She would come back for follow-up visit in 3 weeks for evaluation and management of any adverse effect of her treatment. The patient was advised to call immediately if she has any concerning symptoms in the interval.  The patient voices understanding of current disease status and treatment options and is in agreement with the current care plan.  All questions were answered. The patient knows to call the clinic with any problems, questions or concerns. We can certainly see the patient much sooner if necessary.  Thank you so much for allowing me to participate in the care of Renee Vance. I will continue to follow up the patient with you and assist in her care.  I spent 55 minutes counseling the patient face to face. The total time spent in the appointment was 80 minutes.  Disclaimer: This note was dictated with voice  recognition software. Similar sounding words can inadvertently be transcribed and may not be corrected upon review.   Jailey Booton K. June 23, 2016, 5:11 PM

## 2016-06-23 NOTE — Therapy (Signed)
[place], [place], [address] Phone: [phone]   Fax:  [phone]  Physical Therapy Evaluation  Patient Details  Name: Renee Vance MRN: [id number] Date of Birth: 02/12/1941 Referring Provider: Dr. Gery Pray  Encounter Date: 06/23/2016      PT End of Session - 06/23/16 1723    Visit Number 1   Number of Visits 1   PT Start Time 1605   PT Stop Time 1628   PT Time Calculation (min) 23 min   Activity Tolerance Patient tolerated treatment well   Behavior During Therapy [facility] for tasks assessed/performed      Past Medical History:  Diagnosis Date  . Bradycardia   . CAD (coronary artery disease)   . COPD (chronic obstructive pulmonary disease) ([place])   . Coronary atherosclerosis of native coronary artery 2011  . Dyspnea   . Dyspnea   . Encounter for antineoplastic chemotherapy 06/23/2016  . Fibrocystic breast   . HTN (hypertension)   . HTN (hypertension)   . Hyperlipidemia   . Hypothyroidism   . Obesity   . Pain in joint   . 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 CHOLECYSTECTOMY    . CORONARY ANGIOPLASTY WITH STENT PLACEMENT  2011   Lmain 30-40%, LAD 65-75% (FFR 0.88), CFX 55-60%, RCA 95%>0 w/ 2.5 x 12 mm monorail stent  . TUBAL LIGATION    . VIDEO BRONCHOSCOPY Bilateral 06/07/2016   Procedure: VIDEO BRONCHOSCOPY WITH FLUORO;  Surgeon: [name], MD;  Location: [facility];  Service: Cardiopulmonary;  Laterality: Bilateral;    There were no vitals filed for this visit.       Subjective Assessment - 06/23/16 1629    Subjective reports chronic neck and back problems   Patient is accompained by: Family member  spouse   Pertinent History Patient presented with hemoptysis and workup showed a left lung mass.  Diagnosed with endobronchial squamous cell carcinoma, left upper lobe hilar mass, no  evidence of mets nor hypermetabolic nodes on scans.  She is expected to have chemoradiation, possibly followed by pneumonectomy or lobectomy.  Ex-smoker who quit 04/2014; COPD x 2 years; CAD with angioplasty and stent placement 2011; dyspnea, bradycardia, a-fib and on coumadin, HTN.  She reports chronic low back and neck pain.   Patient Stated Goals get info from all lung clinic providers   Currently in Pain? No/denies   Pain Score --  up to 8/10   Pain Location Back  and neck   Pain Orientation Lower   Pain Descriptors / Indicators Other (Comment)  something digging into her back   Pain Type Chronic pain   Pain Onset More than a month ago   Pain Frequency Intermittent   Aggravating Factors  standing or walking too long   Pain Relieving Factors sitting or lying in fetal position, chiropractic treatment            [facility] PT Assessment - 06/23/16 0001      Assessment   Medical Diagnosis squamous cell carcinoma left upper lobe   Referring Provider Dr. Gery Pray   Onset Date/Surgical Date 06/02/16     Precautions   Precautions Other (comment)   Precaution Comments cancer precautions     Restrictions   Weight Bearing Restrictions No     Balance Screen   Has the patient fallen in the past 6 months No  Has the patient had a decrease in activity level because of a fear of falling?  No   Is the patient reluctant to leave their home because of a fear of falling?  No     Home Ecologist residence   Living Arrangements Spouse/significant other   Type of [place] Layout Multi-level     Prior Function   Level of [place] does no regular exercise; reports being unable to walk for exercise due to back pain and due to shortness of breath     Cognition   Overall Cognitive Status Within Functional Limits for tasks assessed     Observation/Other Assessments   Observations pleasant, well-looking older woman      Functional Tests   Functional tests Sit to Stand     Sit to Stand   Comments 12 times in 20 seconds, okay for age  mild dyspnea following     Posture/Postural Control   Posture/Postural Control Postural limitations   Postural Limitations Forward head     ROM / Strength   AROM / PROM / Strength AROM     AROM   Overall AROM Comments standing trunk AROM WFL except extension 20% limited and uncomfortable     Ambulation/Gait   Ambulation/Gait Yes   Ambulation/Gait Assistance 6: Modified independent (Device/Increase time)  patient reports being limited by back pain and SOB   Gait Comments says she does better with leaning onto a shopping cart     Balance   Balance Assessed Yes     Dynamic Standing Balance   Dynamic Standing - Comments reaches forward 10 inches in standing, just below average for age                           PT Education - 06/23/16 1659    Education provided Yes   Education Details energy conservation, endurance exercise such as pedaling a recumbent bike or water exercise, Cure article on staying active, posture, breathing, cough splinting   Person(s) Educated Patient;Spouse   Methods Explanation;Handout   Comprehension Verbalized understanding               Lung Clinic Goals - 06/23/16 1731      Patient will be able to verbalize understanding of the benefit of exercise to decrease fatigue.   Status Achieved     Patient will be able to verbalize the importance of posture.   Status Achieved     Patient will be able to demonstrate diaphragmatic breathing for improved lung function.   Status Achieved     Patient will be able to verbalize understanding of the role of physical therapy to prevent functional decline and who to contact if physical therapy is needed.   Status Achieved             Plan - 06/23/16 1723    Clinical Impression Statement Patient wiht newly diagnosed lung cancer in left upper lobe who will probably  have chemoradiation and then possible resection.  she has COPD and reports shortness of breath and chronic back pain that both limit her walking distance. She also has chronic neck pain.  h/o angioplasty with stent placement in 2011, HTN, atrial fibrillation; ex-smoker quit 04/2014.   Rehab Potential Good   PT Frequency One time visit   PT Treatment/Interventions Patient/family education   PT Next Visit Plan No PT recommended at this time, but patient  would benefit from pulmonary rehabilitation due to COPD plus new lung cancer diagnosis and c/o shortness of breath limiting activity.   PT Home Exercise Plan exercise such as pedaling a recumbent stationary bike or water exercise, breathing exercise   Recommended Other Services pulmonary rehab   Consulted and Agree with Plan of Care Patient      Patient will benefit from skilled therapeutic intervention in order to improve the following deficits and impairments:  Cardiopulmonary status limiting activity, Pain, Decreased activity tolerance  Visit Diagnosis: Difficulty in walking, not elsewhere classified - Plan: PT plan of care cert/re-cert  Midline low back pain, with sciatica presence unspecified - Plan: PT plan of care cert/re-cert  Cervicalgia - Plan: PT plan of care cert/re-cert      G-Codes - 77/82/42 1731    Functional Assessment Tool Used clinical judgement   Functional Limitation Mobility: Walking and moving around   Mobility: Walking and Moving Around Current Status [phone]) At least 20 percent but less than 40 percent impaired, limited or restricted   Mobility: Walking and Moving Around Goal Status [phone]) At least 20 percent but less than 40 percent impaired, limited or restricted   Mobility: Walking and Moving Around Discharge Status [phone]) At least 20 percent but less than 40 percent impaired, limited or restricted       Problem List Patient Active Problem List   Diagnosis Date Noted  . Stage II squamous cell carcinoma of  left lung ([place]) 06/23/2016  . Encounter for antineoplastic chemotherapy 06/23/2016  . Lung mass   . Hemoptysis 06/02/2016  . Chronic anticoagulation 08/19/2015  . Coronary artery disease involving native coronary artery of native heart without angina pectoris 08/19/2015  . Encounter for therapeutic drug monitoring 05/04/2015  . PAF (paroxysmal atrial fibrillation) ([place]) 03/26/2015  . Premature atrial contractions 11/11/2014  . Hyperlipidemia   . HTN (hypertension)   . Pain in joint   . Coronary atherosclerosis of native coronary artery   . Dyspnea   . Bradycardia   . Hypothyroidism   . Obesity   . Fibrocystic breast   . COPD (chronic obstructive pulmonary disease) ([place])     Armandina Iman 06/23/2016, 5:36 PM  [facility] [place], [place], [address] Phone: [phone]   Fax:  [phone]  Name: MERRIAM BRANDNER MRN: [id number] Date of Birth: 08-Apr-1941  Serafina Royals, PT 06/23/16 5:36 PM

## 2016-06-23 NOTE — Progress Notes (Signed)
PCP is [name], PA-C Referring Provider is [name], MD  No chief complaint on file.   HPI: 75 yo sent for consultation regarding squamous cell carcinoma of the left lung.  [name] is a 75 yo woman with a PMH significant for CAD, stent placement, COPD, hyperlipidemia, hypertension and obesity. She has paroxysmal atrial fibrillation and is on coumadin. She presented about a month ago with hemoptysis. She was referred to Dr. Elsworth Soho. He did a CT which showed a left hilar mass. The mass was hypermetabolic on PET. There was no hilar or mediastinal adenopathy. Bronchoscopy revealed an endobronchial lesion in the LUL. Biopsies showed squamous cell carcinoma.  She denies chest pain, pressure or tightness. She does get short of breath with activity, "any." Says she gets SOB walking up 5 steps to go to the bathroom. No wheezing recently. Appetite OK, lost 6 pounds in 3 months. Quit smoking 2 years ago.  Zubrod Score: At the time of surgery this patient's most appropriate activity status/level should be described as: '[]'$     0    Normal activity, no symptoms '[]'$     1    Restricted in physical strenuous activity but ambulatory, able to do out light work '[x]'$     2    Ambulatory and capable of self care, unable to do work activities, up and about >50 % of waking hours                              '[]'$     3    Only limited self care, in bed greater than 50% of waking hours '[]'$     4    Completely disabled, no self care, confined to bed or chair '[]'$     5    Moribund    Past Medical History:  Diagnosis Date  . Bradycardia   . CAD (coronary artery disease)   . COPD (chronic obstructive pulmonary disease) ([place])   . Coronary atherosclerosis of native coronary artery 2011  . Dyspnea   . Dyspnea   . Fibrocystic breast   . HTN (hypertension)   . HTN (hypertension)   . Hyperlipidemia   . Hypothyroidism   . Obesity   . Pain in joint     Past Surgical History:  Procedure Laterality Date  .  CHOLECYSTECTOMY    . CORONARY ANGIOPLASTY WITH STENT PLACEMENT  2011   Lmain 30-40%, LAD 65-75% (FFR 0.88), CFX 55-60%, RCA 95%>0 w/ 2.5 x 12 mm monorail stent  . TUBAL LIGATION    . VIDEO BRONCHOSCOPY Bilateral 06/07/2016   Procedure: VIDEO BRONCHOSCOPY WITH FLUORO;  Surgeon: [name], MD;  Location: [facility];  Service: Cardiopulmonary;  Laterality: Bilateral;    Family History  Problem Relation Age of Onset  . Heart disease Mother     Social History Social History  Substance Use Topics  . Smoking status: Former Smoker    Packs/day: 0.50    Years: 60.00    Quit date: 04/10/2014  . Smokeless tobacco: Never Used     Comment: june 2015  . Alcohol use No    Current Outpatient Prescriptions  Medication Sig Dispense Refill  . amLODipine (NORVASC) 5 MG tablet Take 5 mg by mouth daily.    Marland Kitchen aspirin 81 MG tablet Take 81 mg by mouth daily.    Marland Kitchen atorvastatin (LIPITOR) 80 MG tablet Take 80 mg by mouth daily.    Marland Kitchen levothyroxine (SYNTHROID, LEVOTHROID) 125  MCG tablet Take 125 mcg by mouth daily before breakfast.     . losartan (COZAAR) 100 MG tablet Take 100 mg by mouth daily.    . metoprolol succinate (TOPROL-XL) 50 MG 24 hr tablet Take 1 tablet (50 mg total) by mouth 2 (two) times daily. 180 tablet 1  . tiotropium (SPIRIVA) 18 MCG inhalation capsule Place 18 mcg into inhaler and inhale daily.    . Vitamin D, Cholecalciferol, 1000 UNITS CAPS Take 1,000 Units by mouth daily.     Marland Kitchen warfarin (COUMADIN) 5 MG tablet Take as directed by Coumadin clinic. (Patient taking differently: Take 2.5-5 mg by mouth daily. Take 2.'5mg'$  on Monday, Wednesday, Friday and Saturday. All other days of the week take '5mg'$ ) 90 tablet 3   No current facility-administered medications for this visit.     Allergies  Allergen Reactions  . Ampicillin     Makes sick  . Codeine     Makes sick    Review of Systems  Constitutional: Positive for activity change, fatigue and unexpected weight change (lost 6 pounds  in 3 months). Negative for appetite change.  HENT: Negative for trouble swallowing and voice change.   Eyes: Positive for visual disturbance.  Respiratory: Positive for cough and shortness of breath. Negative for wheezing.   Cardiovascular: Positive for palpitations. Negative for chest pain.  Gastrointestinal: Negative for abdominal pain and blood in stool.  Genitourinary: Negative for difficulty urinating and dysuria.  Musculoskeletal: Positive for arthralgias and joint swelling.  Neurological: Negative for syncope and weakness.  Hematological: Negative for adenopathy. Bruises/bleeds easily.    BP= 158/64, HR= 63, RR= 17 Physical Exam  Constitutional: She is oriented to person, place, and time. She appears well-developed and well-nourished. No distress.  Cardiovascular: Normal rate, regular rhythm and normal heart sounds.  Exam reveals no gallop and no friction rub.   No murmur heard. Pulmonary/Chest: Effort normal. No respiratory distress. She has no wheezes.  Diminished BS bilaterally  Abdominal: Soft. She exhibits no distension. There is no tenderness.  Musculoskeletal: She exhibits no edema.  Neurological: She is alert and oriented to person, place, and time. No cranial nerve deficit.  No focal motor deficit  Skin: Skin is warm and dry.  Vitals reviewed.    Diagnostic Tests: CT CHEST WITHOUT CONTRAST  TECHNIQUE: Multidetector CT imaging of the chest was performed following the standard protocol without IV contrast.  COMPARISON:  None.  FINDINGS: Cardiovascular: The heart size appears normal. There is aortic atherosclerosis noted. Calcification within the RCA, LAD and left circumflex coronary artery is noted. The trachea appears patent and is midline. Normal appearance of the esophagus.  Mediastinum/Nodes: The trachea appears patent and is midline. Normal appearance of the esophagus. Left hilar mass is identified measuring 1.9 x 3.3 cm and is obstructing the  lingular bronchus, image 69 of series 5.  Lungs/Pleura: Postobstructive pneumonitis of the lingula. Moderate changes of centrilobular and paraseptal emphysema identified. Calcified granuloma noted within the left upper lobe  Upper Abdomen: The adrenal glands are normal. Previous cholecystectomy. No focal liver abnormality. The visualized portions of the spleen, adrenal glands and kidneys are unremarkable.  Musculoskeletal: Degenerative disc disease is identified throughout the thoracic spine. No aggressive lytic or sclerotic bone lesions.  IMPRESSION: 1. Left hilar lung mass is identified and worrisome for primary bronchogenic carcinoma. There is associated postobstructive pneumonitis involving the lingula secondary to obstruction of the lingular bronchus. Further evaluation with PET-CT and tissue sampling is recommended. These results will be called to the  ordering clinician or representative by the Radiologist Assistant, and communication documented in the PACS or zVision Dashboard.   Electronically Signed   By: Kerby Moors M.D.   On: 06/02/2016 16:24 NUCLEAR MEDICINE PET SKULL BASE TO THIGH  TECHNIQUE: 7.7 mCi F-18 FDG was injected intravenously. Full-ring PET imaging was performed from the skull base to thigh after the radiotracer. CT data was obtained and used for attenuation correction and anatomic localization.  FASTING BLOOD GLUCOSE:  Value: 110 mg/dl  COMPARISON:  CT on 06/02/2016  FINDINGS: NECK  No hypermetabolic lymph nodes in the neck.  CHEST  A 3.4 cm mass in the central left upper lobe abuts the left hilum and causes obstruction of the central lingular bronchus. This has SUV max of 30.0. This causes postobstructive pneumonitis in the peripheral lingula.  No hypermetabolic mediastinal or right hilar lymph nodes are identified. No other suspicious pulmonary nodules identified by CT.  ABDOMEN/PELVIS  No abnormal hypermetabolic  activity within the liver, pancreas, adrenal glands, or spleen. No hypermetabolic lymph nodes in the abdomen or pelvis.  Prior cholecystectomy.  Aortic atherosclerosis.  SKELETON  No focal hypermetabolic activity to suggest skeletal metastasis.  IMPRESSION: 3.4 cm hypermetabolic central left upper lobe mass which involves the left hilum, consistent with primary bronchogenic carcinoma. This causes postobstructive pneumonitis within the lingula.  No other hypermetabolic thoracic lymphadenopathy or distant metastatic disease.   Electronically Signed   By: Earle Gell M.D.   On: 06/17/2016 14:12 I personally reviewed the CT and PET and concur with the findings noted above  Impression: 75 yo woman with a history of tobacco abuse and COPD who presented with hemoptysis and was found to have a central squamous cell carcinoma of the left upper lobe. There is no evidence of hilar or mediastinal node involvement or distant metastasis.  The lesion is very central and there may even be invasion of the mediastinum. Even if mediastinum not involved it would require a pneumonectomy. Her FEV1 was 1.2 in 2012 (I don't have more recent PFTs). Based on her functional status she is not a candidate for a pneumonectomy.   It is possible, although not a certainty, that she might be a candidate for an upper lobectomy if she has a good response to induction therapy with chemo and radiation. She needs PFTs to see is that is even an outside possibility.  Plan:  PFTs Chemoradiation and reassess  Melrose Nakayama, MD [facility] [phone]

## 2016-06-23 NOTE — Progress Notes (Signed)
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   Clinical stage T2a, N0, Mx squamous cell carcinoma of the left upper lung  HISTORY OF PRESENT ILLNESS::Renee Vance is a 75 y.o. female who presented to the ED on 05/29/16 for an onset of hemoptysis. Chest X-ray noted peribronchial thickening and mild bibasilar atelectasis. The patient was subsequently referred to pulmonology.  The patient presented to Dr. Elsworth Soho on 06/02/16. CT scan of the chest w/o contrast showed a 1.9 x 3.3 cm left hilar mass obstructing the lingular bronchus, postobstructive pneumonitis of the lingula, and emphysema. Brushings of the lesion and washings of the superior lingula segment of the LUL revealed malignant cells consistent with non-small cell carcinoma. Endobronchial biopsies of the mass revealed squamous cell carcinoma.  PET scan on 06/17/16 revealed a hypermetabolic 3.4 cm mass in the central LUL abutting the left hilum and causing obstruction of the central lingular bronchus with an SUV max of 30.0. It is causing postobstructive pneumonitis in the peripheral lingula. No hypermetabolic lymph nodes were identified in the mediastinum or right hilum.  The patient and her husband present today in multidisciplinary thoracic clinic to discuss treatment options for the management of her disease.  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    PAST SURGICAL HISTORY: Past Surgical History:  Procedure Laterality Date  . CHOLECYSTECTOMY    . CORONARY ANGIOPLASTY WITH STENT PLACEMENT  2011   Lmain 30-40%, LAD 65-75% (FFR 0.88), CFX 55-60%, RCA 95%>0 w/ 2.5 x 12 mm monorail stent  . TUBAL LIGATION    . VIDEO BRONCHOSCOPY Bilateral 06/07/2016   Procedure: VIDEO BRONCHOSCOPY WITH FLUORO;  Surgeon: [name], MD;  Location: [facility];  Service: Cardiopulmonary;  Laterality: Bilateral;    FAMILY HISTORY: family history includes Heart disease in her mother.  SOCIAL HISTORY:  reports that she quit smoking about 2 years ago. She has a 30.00 pack-year smoking history. She has never used smokeless tobacco. She reports that she does not drink alcohol.  ALLERGIES: Ampicillin and Codeine  MEDICATIONS:  Current Outpatient Prescriptions  Medication Sig Dispense Refill  . amLODipine (NORVASC) 5 MG tablet Take 5 mg by mouth daily.    Marland Kitchen aspirin 81 MG tablet Take 81 mg by mouth daily.    Marland Kitchen atorvastatin (LIPITOR) 80 MG tablet Take 80 mg by mouth daily.    Marland Kitchen levothyroxine (SYNTHROID, LEVOTHROID) 125 MCG tablet Take 125 mcg by mouth daily before breakfast.     . losartan (COZAAR) 100 MG tablet Take 100 mg by mouth daily.    . metoprolol succinate (TOPROL-XL) 50 MG 24 hr tablet Take 1 tablet (50 mg total) by mouth 2 (two) times daily. 180 tablet 1  . prochlorperazine (COMPAZINE) 10 MG tablet Take 1 tablet (10 mg total) by mouth every 6 (six) hours as needed for nausea or vomiting. 30 tablet 0  . tiotropium (  SPIRIVA) 18 MCG inhalation capsule Place 18 mcg into inhaler and inhale daily.    . Vitamin D, Cholecalciferol, 1000 UNITS CAPS Take 1,000 Units by mouth daily.     Marland Kitchen warfarin (COUMADIN) 5 MG tablet Take as directed by Coumadin clinic. (Patient taking  differently: Take 2.5-5 mg by mouth daily. Take 2.'5mg'$  on Monday, Wednesday, Friday and Saturday. All other days of the week take '5mg'$ ) 90 tablet 3   No current facility-administered medications for this encounter.     REVIEW OF SYSTEMS:  A 15 point review of systems is documented in the electronic medical record. This was obtained by the nursing staff. However, I reviewed this with the patient to discuss relevant findings and make appropriate changes.  Pertinent items noted in HPI and remainder of comprehensive ROS otherwise negative.   The patient reports her hemoptysis stopped for a while, but noticed a little bit this morning. She denies appetite changes. Chronic low back pain.   PHYSICAL EXAM:  Vitals with BMI 06/23/2016  Height '5\' 4"'$   Weight 154 lbs 10 oz  BMI 62.8  Systolic 315  Diastolic 64  Pulse 63  Respirations 17  General: Alert and oriented, in no acute distress HEENT: Head is normocephalic. Extraocular movements are intact. Oropharynx is clear. Top and bottom dentures. Neck: Neck is supple, no palpable cervical or supraclavicular lymphadenopathy. Heart: Regular in rate and rhythm with no murmurs, rubs, or gallops. Chest: Clear to auscultation bilaterally, with no rhonchi, wheezes, or rales. Abdomen: Soft, nontender, nondistended, with no rigidity or guarding. Extremities: No cyanosis or edema. Lymphatics: see Neck Exam Skin: No concerning lesions. Musculoskeletal: symmetric strength and muscle tone throughout. Neurologic: Cranial nerves II through XII are grossly intact. No obvious focalities. Speech is fluent. Coordination is intact. Psychiatric: Judgment and insight are intact. Affect is appropriate.  ECOG = 1  LABORATORY DATA:  Lab Results  Component Value Date   WBC 8.1 05/29/2016   HGB 11.4 (L) 05/29/2016   HCT 35.8 (L) 05/29/2016   MCV 89.9 05/29/2016   PLT 250 05/29/2016   NEUTROABS 6.1 05/29/2016   Lab Results  Component Value Date   NA 138 05/29/2016     K 4.0 05/29/2016   CL 105 05/29/2016   CO2 24 05/29/2016   GLUCOSE 120 (H) 05/29/2016   CREATININE 1.55 (H) 05/29/2016   CALCIUM 9.6 05/29/2016      RADIOGRAPHY: Dg Chest 2 View  Result Date: 05/29/2016 CLINICAL DATA:  Acute onset of hemoptysis.  Initial encounter. EXAM: CHEST  2 VIEW COMPARISON:  Chest radiograph performed 02/06/2015 FINDINGS: The lungs are well-aerated. Peribronchial thickening is noted. Mild bibasilar atelectasis is seen. There is no evidence of pleural effusion or pneumothorax. The heart is normal in size; the mediastinal contour is within normal limits. No acute osseous abnormalities are seen. IMPRESSION: Peribronchial thickening noted.  Mild bibasilar atelectasis seen. Electronically Signed   By: Garald Balding M.D.   On: 05/29/2016 02:57   Ct Chest Wo Contrast  Result Date: 06/02/2016 CLINICAL DATA:  Coughing up blood since Sunday smoker. EXAM: CT CHEST WITHOUT CONTRAST TECHNIQUE: Multidetector CT imaging of the chest was performed following the standard protocol without IV contrast. COMPARISON:  None. FINDINGS: Cardiovascular: The heart size appears normal. There is aortic atherosclerosis noted. Calcification within the RCA, LAD and left circumflex coronary artery is noted. The trachea appears patent and is midline. Normal appearance of the esophagus. Mediastinum/Nodes: The trachea appears patent and is midline. Normal appearance of the esophagus. Left hilar mass  is identified measuring 1.9 x 3.3 cm and is obstructing the lingular bronchus, image 69 of series 5. Lungs/Pleura: Postobstructive pneumonitis of the lingula. Moderate changes of centrilobular and paraseptal emphysema identified. Calcified granuloma noted within the left upper lobe Upper Abdomen: The adrenal glands are normal. Previous cholecystectomy. No focal liver abnormality. The visualized portions of the spleen, adrenal glands and kidneys are unremarkable. Musculoskeletal: Degenerative disc disease is  identified throughout the thoracic spine. No aggressive lytic or sclerotic bone lesions. IMPRESSION: 1. Left hilar lung mass is identified and worrisome for primary bronchogenic carcinoma. There is associated postobstructive pneumonitis involving the lingula secondary to obstruction of the lingular bronchus. Further evaluation with PET-CT and tissue sampling is recommended. These results will be called to the ordering clinician or representative by the Radiologist Assistant, and communication documented in the PACS or zVision Dashboard. Electronically Signed   By: Kerby Moors M.D.   On: 06/02/2016 16:24   Nm Pet Image Initial (pi) Skull Base To Thigh  Result Date: 06/17/2016 CLINICAL DATA:  Initial treatment strategy for left upper lobe pulmonary mass. EXAM: NUCLEAR MEDICINE PET SKULL BASE TO THIGH TECHNIQUE: 7.7 mCi F-18 FDG was injected intravenously. Full-ring PET imaging was performed from the skull base to thigh after the radiotracer. CT data was obtained and used for attenuation correction and anatomic localization. FASTING BLOOD GLUCOSE:  Value: 110 mg/dl COMPARISON:  CT on 06/02/2016 FINDINGS: NECK No hypermetabolic lymph nodes in the neck. CHEST A 3.4 cm mass in the central left upper lobe abuts the left hilum and causes obstruction of the central lingular bronchus. This has SUV max of 30.0. This causes postobstructive pneumonitis in the peripheral lingula. No hypermetabolic mediastinal or right hilar lymph nodes are identified. No other suspicious pulmonary nodules identified by CT. ABDOMEN/PELVIS No abnormal hypermetabolic activity within the liver, pancreas, adrenal glands, or spleen. No hypermetabolic lymph nodes in the abdomen or pelvis. Prior cholecystectomy.  Aortic atherosclerosis. SKELETON No focal hypermetabolic activity to suggest skeletal metastasis. IMPRESSION: 3.4 cm hypermetabolic central left upper lobe mass which involves the left hilum, consistent with primary bronchogenic carcinoma.  This causes postobstructive pneumonitis within the lingula. No other hypermetabolic thoracic lymphadenopathy or distant metastatic disease. Electronically Signed   By: Earle Gell M.D.   On: 06/17/2016 14:12      IMPRESSION: Clinical stage T2a, N0, Mx squamous cell carcinoma of the left upper lung  We discussed her diagnosis and stage. We discussed pre-op chemoradiation in the management of her disease. We discussed 5 weeks of treatment as an outpatient. We discussed the process of CT simulation and the placement of tattoos. We discussed dysphagia, weight loss and fatigue as the acute side effects of radiation. We discussed damage to critical normal structures in the chest as well as the spinal cord as possible but improbably.  PLAN: The patient signed a consent form and this was placed in her medical chart. CT simulation is scheduled on 06/28/16 at 1PM. She will be scheduled for a brain MRI for staging purposes in the near future.   ------------------------------------------------  Blair Promise, PhD, MD  This document serves as a record of services personally performed by Gery Pray, MD. It was created on his behalf by Darcus Austin, a trained medical scribe. The creation of this record is based on the scribe's personal observations and the provider's statements to them. This document has been checked and approved by the attending provider.

## 2016-06-27 DIAGNOSIS — M9901 Segmental and somatic dysfunction of cervical region: Secondary | ICD-10-CM | POA: Diagnosis not present

## 2016-06-27 DIAGNOSIS — M531 Cervicobrachial syndrome: Secondary | ICD-10-CM | POA: Diagnosis not present

## 2016-06-28 ENCOUNTER — Telehealth: Payer: Self-pay | Admitting: Adult Health

## 2016-06-28 ENCOUNTER — Ambulatory Visit
Admission: RE | Admit: 2016-06-28 | Discharge: 2016-06-28 | Disposition: A | Discharge status: Home / Self Care | Payer: PPO | Source: Ambulatory Visit | Attending: Radiation Oncology | Admitting: Radiation Oncology

## 2016-06-28 DIAGNOSIS — C3492 Malignant neoplasm of unspecified part of left bronchus or lung: Secondary | ICD-10-CM | POA: Insufficient documentation

## 2016-06-28 DIAGNOSIS — Z510 Encounter for antineoplastic radiation therapy: Secondary | ICD-10-CM | POA: Diagnosis not present

## 2016-06-28 DIAGNOSIS — C3412 Malignant neoplasm of upper lobe, left bronchus or lung: Secondary | ICD-10-CM | POA: Diagnosis not present

## 2016-06-28 NOTE — Telephone Encounter (Signed)
Spoke with pt. She is starting radiation therapy today. Pt had an appointment with TP on 06/30/16 and she canceled this. She is wanting to know if TP thinks she should reschedule.  TP - please advise. Thanks.

## 2016-06-28 NOTE — Telephone Encounter (Signed)
LMTCB

## 2016-06-28 NOTE — Telephone Encounter (Signed)
Called spoke with pt and made aware of below. She reports once she is done with treatments that she will call back to schedule a f/u appt. Nothing further needed

## 2016-06-28 NOTE — Telephone Encounter (Signed)
Can reschedule in ~2 months and As needed   Good luck with upcoming treatment  Call if needed sooner.

## 2016-06-28 NOTE — Telephone Encounter (Signed)
Pt returning call.Renee Vance ° °

## 2016-06-28 NOTE — Progress Notes (Signed)
  Radiation Oncology         (336) [phone] ________________________________  Name: Renee Vance MRN: [id number]  Date: 06/28/2016  DOB: 1941-06-15  SIMULATION AND TREATMENT PLANNING NOTE    ICD-9-CM ICD-10-CM   1. 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  Identity was confirmed.  All relevant records and images related to the planned course of therapy were reviewed.  The patient freely provided informed written consent to proceed with treatment after reviewing the details related to the planned course of therapy. The consent form was witnessed and verified by the simulation staff.  Then, the patient was set-up in a stable reproducible  supine position for radiation therapy.  CT images were obtained.  Surface markings were placed.  The CT images were loaded into the planning software.  Then the target and avoidance structures were contoured.  Treatment planning then occurred.  The radiation prescription was entered and confirmed.  Then, I designed and supervised the construction of a total of 6 medically necessary complex treatment devices.  I have requested : 3D Simulation  I have requested a DVH of the following structures: heart, lungs, spinal cord, GTV, PTV.  I have ordered:dose calc.  PLAN:  The patient will receive 45 Gy in 25 fractions Along with radiosensitizing chemotherapy.  -----------------------------------  Special Treatment Procedure Note: The patient will be receiving radiosensitizing chemotherapy. Given the potential of increased toxicities related to combined therapy and the necessity for close monitoring of the patient and blood work, this constitutes a special treatment procedure.   Blair Promise, PhD, MD  This document serves as a record of services personally performed by Gery Pray, MD. It was created on his behalf by Truddie Hidden, a trained medical scribe. The creation of this record is based on the scribe's personal observations and the provider's statements to them. This document has been checked and approved by the attending provider.

## 2016-06-29 ENCOUNTER — Encounter: Payer: Self-pay | Admitting: *Deleted

## 2016-06-29 ENCOUNTER — Telehealth: Payer: Self-pay | Admitting: Internal Medicine

## 2016-06-29 ENCOUNTER — Ambulatory Visit (HOSPITAL_COMMUNITY)
Admission: RE | Admit: 2016-06-29 | Discharge: 2016-06-29 | Disposition: A | Discharge status: Home / Self Care | Payer: PPO | Source: Ambulatory Visit | Attending: Pulmonary Disease | Admitting: Pulmonary Disease

## 2016-06-29 DIAGNOSIS — R918 Other nonspecific abnormal finding of lung field: Secondary | ICD-10-CM | POA: Diagnosis not present

## 2016-06-29 LAB — PULMONARY FUNCTION TEST
DL/VA % pred: 71 %
DL/VA: 3.37 ml/min/mmHg/L
DLCO unc % pred: 55 %
DLCO unc: 13.07 ml/min/mmHg
FEF 25-75 Post: 1.19 L/sec
FEF 25-75 Pre: 0.88 L/sec
FEF2575-%CHANGE-POST: 34 %
FEF2575-%PRED-POST: 77 %
FEF2575-%Pred-Pre: 57 %
FEV1-%Change-Post: 8 %
FEV1-%PRED-PRE: 69 %
FEV1-%Pred-Post: 74 %
FEV1-POST: 1.5 L
FEV1-PRE: 1.38 L
FEV1FVC-%CHANGE-POST: 3 %
FEV1FVC-%Pred-Pre: 94 %
FEV6-%Change-Post: 6 %
FEV6-%PRED-POST: 81 %
FEV6-%PRED-PRE: 76 %
FEV6-PRE: 1.94 L
FEV6-Post: 2.07 L
FEV6FVC-%Change-Post: 0 %
FEV6FVC-%PRED-PRE: 105 %
FEV6FVC-%Pred-Post: 105 %
FVC-%CHANGE-POST: 5 %
FVC-%PRED-POST: 77 %
FVC-%Pred-Pre: 73 %
FVC-Post: 2.07 L
FVC-Pre: 1.97 L
POST FEV1/FVC RATIO: 73 %
PRE FEV6/FVC RATIO: 100 %
Post FEV6/FVC ratio: 100 %
Pre FEV1/FVC ratio: 70 %
RV % PRED: 169 %
RV: 3.9 L
TLC % pred: 119 %
TLC: 5.94 L

## 2016-06-29 MED ORDER — ALBUTEROL SULFATE (2.5 MG/3ML) 0.083% IN NEBU
2.5000 mg | INHALATION_SOLUTION | Freq: Once | RESPIRATORY_TRACT | Status: AC
  Administered 2016-06-29: 2.5 mg via RESPIRATORY_TRACT

## 2016-06-29 NOTE — Progress Notes (Signed)
Oncology Nurse Navigator Documentation  Oncology Nurse Navigator Flowsheets 06/29/2016  Navigator Encounter Type Other/Dr. Julien Nordmann ordered chemo for Ms. Anton to being on 07/04/16 ( order placed on 06/23/16).  I contacted per cert and scheduling to update patient has not been scheduled as of now.   Treatment Phase Pre-Tx/Tx Discussion  Barriers/Navigation Needs Coordination of Care  Interventions Coordination of Care  Coordination of Care Chemo  Acuity Level 2  Acuity Level 2 Assistance expediting appointments  Time Spent with Patient 30

## 2016-06-29 NOTE — Progress Notes (Signed)
Oncology Nurse Navigator Documentation  Oncology Nurse Navigator Flowsheets 06/29/2016  Navigator Encounter Type Other/I received message that patient's chemo has been authorized.  I notified scheduling to schedule for 07/04/16.    Treatment Phase Pre-Tx/Tx Discussion  Barriers/Navigation Needs Coordination of Care  Interventions Coordination of Care  Coordination of Care Chemo  Acuity Level 2  Acuity Level 2 Assistance expediting appointments  Time Spent with Patient 30

## 2016-06-29 NOTE — Telephone Encounter (Signed)
Spoke with patient re ched class 9/22 and starting tx 9/25. Per patient 9/22 is no good for her and patient also did not want to come on 9/21. Per patient she has an mri on 9/25 and would like to do the ched class on 9/25 as well and start tx 9/26 so she won't have to make so many trips to [place].   Patient informed I would check w/Dana/MM and get back to her.

## 2016-06-30 ENCOUNTER — Telehealth: Payer: Self-pay | Admitting: *Deleted

## 2016-06-30 ENCOUNTER — Telehealth: Payer: Self-pay | Admitting: Internal Medicine

## 2016-06-30 ENCOUNTER — Ambulatory Visit: Payer: PPO | Admitting: Adult Health

## 2016-06-30 ENCOUNTER — Encounter (INDEPENDENT_AMBULATORY_CARE_PROVIDER_SITE_OTHER): Payer: Self-pay

## 2016-06-30 ENCOUNTER — Ambulatory Visit (INDEPENDENT_AMBULATORY_CARE_PROVIDER_SITE_OTHER): Payer: PPO | Admitting: *Deleted

## 2016-06-30 DIAGNOSIS — C3412 Malignant neoplasm of upper lobe, left bronchus or lung: Secondary | ICD-10-CM | POA: Diagnosis not present

## 2016-06-30 DIAGNOSIS — Z510 Encounter for antineoplastic radiation therapy: Secondary | ICD-10-CM | POA: Diagnosis not present

## 2016-06-30 DIAGNOSIS — I480 Paroxysmal atrial fibrillation: Secondary | ICD-10-CM | POA: Diagnosis not present

## 2016-06-30 DIAGNOSIS — Z5181 Encounter for therapeutic drug level monitoring: Secondary | ICD-10-CM | POA: Diagnosis not present

## 2016-06-30 LAB — POCT INR: INR: 1.9

## 2016-06-30 NOTE — Telephone Encounter (Signed)
Look like [name] worked on this. Chemo in for Tuesday, buyt no late chemo class on Monday. Patient still scheduled for tomorrow

## 2016-06-30 NOTE — Telephone Encounter (Signed)
Oncology Nurse Navigator Documentation  Oncology Nurse Navigator Flowsheets 06/30/2016  Navigator Encounter Type Telephone/Ms. Esker called and needed help changing appt.  She stated it takes a long time to here to cancer center from [place].  I spoke with scheduling and changed appt's.  I called Ms. Dufrane and updated.   I asked if she would like me to mail a copy of her new schedule.  She stated yes.  I will mail today.    Telephone Outgoing Call  Treatment Phase Pre-Tx/Tx Discussion  Barriers/Navigation Needs Coordination of Care  Interventions Coordination of Care  Coordination of Care Chemo;Appts  Acuity Level 2  Acuity Level 2 Assistance expediting appointments  Time Spent with Patient 30

## 2016-06-30 NOTE — Telephone Encounter (Signed)
Received call from pt stating that she received call about appts & she can't come the dates/times given.  She states that she needs to work on fri & needs to cancel chemo class for tomorrow.  She would like to coodinate chemo class with MRI on Monday & move chemo to tues.  Informed that we would verify with Dr. Mohamed/Dana/Pod RN & have someone call her back with changes.

## 2016-06-30 NOTE — Telephone Encounter (Signed)
I will look at it. I never received Hinton Dyer message

## 2016-06-30 NOTE — Telephone Encounter (Signed)
Appointments complete per 9/14 los. Patient given appointments for 9/25, 9/26 and 10/2 by thoracic coordinator and will get new schedule 9/25.   Per thoracic coordinator ok to start 1st treatment on 9/26 and then every Monday.

## 2016-07-01 ENCOUNTER — Other Ambulatory Visit: Payer: PPO

## 2016-07-04 ENCOUNTER — Ambulatory Visit (HOSPITAL_COMMUNITY)
Admission: RE | Admit: 2016-07-04 | Discharge: 2016-07-04 | Disposition: A | Discharge status: Home / Self Care | Payer: PPO | Source: Ambulatory Visit | Attending: Internal Medicine | Admitting: Internal Medicine

## 2016-07-04 ENCOUNTER — Other Ambulatory Visit: Payer: PPO

## 2016-07-04 ENCOUNTER — Ambulatory Visit: Payer: PPO

## 2016-07-04 DIAGNOSIS — R918 Other nonspecific abnormal finding of lung field: Secondary | ICD-10-CM | POA: Diagnosis not present

## 2016-07-04 DIAGNOSIS — Z9221 Personal history of antineoplastic chemotherapy: Secondary | ICD-10-CM | POA: Insufficient documentation

## 2016-07-04 DIAGNOSIS — C3492 Malignant neoplasm of unspecified part of left bronchus or lung: Secondary | ICD-10-CM | POA: Insufficient documentation

## 2016-07-04 DIAGNOSIS — Z5111 Encounter for antineoplastic chemotherapy: Secondary | ICD-10-CM

## 2016-07-04 MED ORDER — GADOBENATE DIMEGLUMINE 529 MG/ML IV SOLN
15.0000 mL | Freq: Once | INTRAVENOUS | Status: AC | PRN
  Administered 2016-07-04: 15 mL via INTRAVENOUS

## 2016-07-05 ENCOUNTER — Ambulatory Visit
Admission: RE | Admit: 2016-07-05 | Discharge: 2016-07-05 | Disposition: A | Discharge status: Home / Self Care | Payer: PPO | Source: Ambulatory Visit | Attending: Radiation Oncology | Admitting: Radiation Oncology

## 2016-07-05 ENCOUNTER — Ambulatory Visit (HOSPITAL_BASED_OUTPATIENT_CLINIC_OR_DEPARTMENT_OTHER): Payer: PPO

## 2016-07-05 ENCOUNTER — Other Ambulatory Visit: Payer: Self-pay | Admitting: Internal Medicine

## 2016-07-05 ENCOUNTER — Encounter: Payer: Self-pay | Admitting: Internal Medicine

## 2016-07-05 ENCOUNTER — Encounter: Payer: Self-pay | Admitting: Radiation Oncology

## 2016-07-05 VITALS — BP 144/61 | HR 60 | Temp 98.2°F | Resp 20 | Wt 152.9 lb

## 2016-07-05 VITALS — BP 146/69 | HR 55 | Temp 97.0°F | Resp 20

## 2016-07-05 DIAGNOSIS — C3492 Malignant neoplasm of unspecified part of left bronchus or lung: Secondary | ICD-10-CM

## 2016-07-05 DIAGNOSIS — C3412 Malignant neoplasm of upper lobe, left bronchus or lung: Secondary | ICD-10-CM

## 2016-07-05 DIAGNOSIS — Z510 Encounter for antineoplastic radiation therapy: Secondary | ICD-10-CM | POA: Diagnosis not present

## 2016-07-05 DIAGNOSIS — Z5111 Encounter for antineoplastic chemotherapy: Secondary | ICD-10-CM

## 2016-07-05 LAB — CBC WITH DIFFERENTIAL/PLATELET
BASO%: 0.4 % (ref 0.0–2.0)
Basophils Absolute: 0 10*3/uL (ref 0.0–0.1)
EOS%: 1.4 % (ref 0.0–7.0)
Eosinophils Absolute: 0.1 10*3/uL (ref 0.0–0.5)
HEMATOCRIT: 36.7 % (ref 34.8–46.6)
HEMOGLOBIN: 12.1 g/dL (ref 11.6–15.9)
LYMPH#: 0.9 10*3/uL (ref 0.9–3.3)
LYMPH%: 11 % — ABNORMAL LOW (ref 14.0–49.7)
MCH: 28.5 pg (ref 25.1–34.0)
MCHC: 32.9 g/dL (ref 31.5–36.0)
MCV: 86.7 fL (ref 79.5–101.0)
MONO#: 0.8 10*3/uL (ref 0.1–0.9)
MONO%: 9.8 % (ref 0.0–14.0)
NEUT#: 6.1 10*3/uL (ref 1.5–6.5)
NEUT%: 77.4 % — AB (ref 38.4–76.8)
Platelets: 282 10*3/uL (ref 145–400)
RBC: 4.23 10*6/uL (ref 3.70–5.45)
RDW: 14.9 % — AB (ref 11.2–14.5)
WBC: 7.9 10*3/uL (ref 3.9–10.3)

## 2016-07-05 LAB — COMPREHENSIVE METABOLIC PANEL
ALBUMIN: 3 g/dL — AB (ref 3.5–5.0)
ALK PHOS: 104 U/L (ref 40–150)
ALT: 18 U/L (ref 0–55)
AST: 21 U/L (ref 5–34)
Anion Gap: 10 mEq/L (ref 3–11)
BUN: 24.7 mg/dL (ref 7.0–26.0)
CALCIUM: 9.7 mg/dL (ref 8.4–10.4)
CHLORIDE: 108 meq/L (ref 98–109)
CO2: 23 mEq/L (ref 22–29)
Creatinine: 1.4 mg/dL — ABNORMAL HIGH (ref 0.6–1.1)
EGFR: 36 mL/min/{1.73_m2} — AB (ref >90)
Glucose: 114 mg/dl (ref 70–140)
POTASSIUM: 4.2 meq/L (ref 3.5–5.1)
Sodium: 141 mEq/L (ref 136–145)
Total Bilirubin: 0.44 mg/dL (ref 0.20–1.20)
Total Protein: 7.6 g/dL (ref 6.4–8.3)

## 2016-07-05 MED ORDER — PACLITAXEL CHEMO INJECTION 300 MG/50ML
45.0000 mg/m2 | Freq: Once | INTRAVENOUS | Status: AC
  Administered 2016-07-05: 78 mg via INTRAVENOUS
  Filled 2016-07-05: qty 13

## 2016-07-05 MED ORDER — PALONOSETRON HCL INJECTION 0.25 MG/5ML
INTRAVENOUS | Status: AC
  Filled 2016-07-05: qty 5

## 2016-07-05 MED ORDER — DIPHENHYDRAMINE HCL 50 MG/ML IJ SOLN
50.0000 mg | Freq: Once | INTRAMUSCULAR | Status: AC
  Administered 2016-07-05: 50 mg via INTRAVENOUS

## 2016-07-05 MED ORDER — HEPARIN SOD (PORK) LOCK FLUSH 100 UNIT/ML IV SOLN
500.0000 [IU] | Freq: Once | INTRAVENOUS | Status: DC | PRN
  Filled 2016-07-05: qty 5

## 2016-07-05 MED ORDER — SONAFINE EX EMUL
1.0000 "application " | Freq: Two times a day (BID) | CUTANEOUS | Status: DC
  Administered 2016-07-05: 1 via TOPICAL

## 2016-07-05 MED ORDER — FAMOTIDINE IN NACL 20-0.9 MG/50ML-% IV SOLN
INTRAVENOUS | Status: AC
  Filled 2016-07-05: qty 50

## 2016-07-05 MED ORDER — DIPHENHYDRAMINE HCL 50 MG/ML IJ SOLN
INTRAMUSCULAR | Status: AC
  Filled 2016-07-05: qty 1

## 2016-07-05 MED ORDER — FAMOTIDINE IN NACL 20-0.9 MG/50ML-% IV SOLN
20.0000 mg | Freq: Once | INTRAVENOUS | Status: AC
  Administered 2016-07-05: 20 mg via INTRAVENOUS

## 2016-07-05 MED ORDER — PALONOSETRON HCL INJECTION 0.25 MG/5ML
0.2500 mg | Freq: Once | INTRAVENOUS | Status: AC
  Administered 2016-07-05: 0.25 mg via INTRAVENOUS

## 2016-07-05 MED ORDER — SODIUM CHLORIDE 0.9% FLUSH
10.0000 mL | INTRAVENOUS | Status: DC | PRN
  Filled 2016-07-05: qty 10

## 2016-07-05 MED ORDER — DEXAMETHASONE SODIUM PHOSPHATE 100 MG/10ML IJ SOLN
20.0000 mg | Freq: Once | INTRAMUSCULAR | Status: AC
  Administered 2016-07-05: 20 mg via INTRAVENOUS
  Filled 2016-07-05: qty 2

## 2016-07-05 MED ORDER — SODIUM CHLORIDE 0.9 % IV SOLN
Freq: Once | INTRAVENOUS | Status: AC
  Administered 2016-07-05: 10:00:00 via INTRAVENOUS

## 2016-07-05 MED ORDER — CARBOPLATIN CHEMO INJECTION 450 MG/45ML
125.6000 mg | Freq: Once | INTRAVENOUS | Status: AC
  Administered 2016-07-05: 130 mg via INTRAVENOUS
  Filled 2016-07-05: qty 13

## 2016-07-05 NOTE — Addendum Note (Signed)
Encounter addended by: Doreen Beam, RN on: 07/05/2016  3:05 PM<BR>    Actions taken: Patient Education assessment filed

## 2016-07-05 NOTE — Patient Instructions (Signed)
[facility] Discharge Instructions for Patients Receiving Chemotherapy  Today you received the following chemotherapy agents taxol and carbplatin  To help prevent nausea and vomiting after your treatment, we encourage you to take your nausea medication as prescribed   If you develop nausea and vomiting that is not controlled by your nausea medication, call the clinic.   BELOW ARE SYMPTOMS THAT SHOULD BE REPORTED IMMEDIATELY:  *FEVER GREATER THAN 100.5 F  *CHILLS WITH OR WITHOUT FEVER  NAUSEA AND VOMITING THAT IS NOT CONTROLLED WITH YOUR NAUSEA MEDICATION  *UNUSUAL SHORTNESS OF BREATH  *UNUSUAL BRUISING OR BLEEDING  TENDERNESS IN MOUTH AND THROAT WITH OR WITHOUT PRESENCE OF ULCERS  *URINARY PROBLEMS  *BOWEL PROBLEMS  UNUSUAL RASH Items with * indicate a potential emergency and should be followed up as soon as possible.  Feel free to call the clinic you have any questions or concerns. The clinic phone number is [phone].  Please show the [facility] at check-in to the Emergency Department and triage nurse.  Paclitaxel injection What is this medicine? PACLITAXEL (PAK li TAX el) is a chemotherapy drug. It targets fast dividing cells, like cancer cells, and causes these cells to die. This medicine is used to treat ovarian cancer, breast cancer, and other cancers. This medicine may be used for other purposes; ask your health care provider or pharmacist if you have questions. What should I tell my health care provider before I take this medicine? They need to know if you have any of these conditions: -blood disorders -irregular heartbeat -infection (especially a virus infection such as chickenpox, cold sores, or herpes) -liver disease -previous or ongoing radiation therapy -an unusual or allergic reaction to paclitaxel, alcohol, polyoxyethylated castor oil, other chemotherapy agents, other medicines, foods, dyes, or preservatives -pregnant or trying to  get pregnant -breast-feeding How should I use this medicine? This drug is given as an infusion into a vein. It is administered in a hospital or clinic by a specially trained health care professional. Talk to your pediatrician regarding the use of this medicine in children. Special care may be needed. Overdosage: If you think you have taken too much of this medicine contact a poison control center or emergency room at once. NOTE: This medicine is only for you. Do not share this medicine with others. What if I miss a dose? It is important not to miss your dose. Call your doctor or health care professional if you are unable to keep an appointment. What may interact with this medicine? Do not take this medicine with any of the following medications: -disulfiram -metronidazole This medicine may also interact with the following medications: -cyclosporine -diazepam -ketoconazole -medicines to increase blood counts like filgrastim, pegfilgrastim, sargramostim -other chemotherapy drugs like cisplatin, doxorubicin, epirubicin, etoposide, teniposide, vincristine -quinidine -testosterone -vaccines -verapamil Talk to your doctor or health care professional before taking any of these medicines: -acetaminophen -aspirin -ibuprofen -ketoprofen -naproxen This list may not describe all possible interactions. Give your health care provider a list of all the medicines, herbs, non-prescription drugs, or dietary supplements you use. Also tell them if you smoke, drink alcohol, or use illegal drugs. Some items may interact with your medicine. What should I watch for while using this medicine? Your condition will be monitored carefully while you are receiving this medicine. You will need important blood work done while you are taking this medicine. This drug may make you feel generally unwell. This is not uncommon, as chemotherapy can affect healthy cells as well  as cancer cells. Report any side effects.  Continue your course of treatment even though you feel ill unless your doctor tells you to stop. This medicine can cause serious allergic reactions. To reduce your risk you will need to take other medicine(s) before treatment with this medicine. In some cases, you may be given additional medicines to help with side effects. Follow all directions for their use. Call your doctor or health care professional for advice if you get a fever, chills or sore throat, or other symptoms of a cold or flu. Do not treat yourself. This drug decreases your body's ability to fight infections. Try to avoid being around people who are sick. This medicine may increase your risk to bruise or bleed. Call your doctor or health care professional if you notice any unusual bleeding. Be careful brushing and flossing your teeth or using a toothpick because you may get an infection or bleed more easily. If you have any dental work done, tell your dentist you are receiving this medicine. Avoid taking products that contain aspirin, acetaminophen, ibuprofen, naproxen, or ketoprofen unless instructed by your doctor. These medicines may hide a fever. Do not become pregnant while taking this medicine. Women should inform their doctor if they wish to become pregnant or think they might be pregnant. There is a potential for serious side effects to an unborn child. Talk to your health care professional or pharmacist for more information. Do not breast-feed an infant while taking this medicine. Men are advised not to father a child while receiving this medicine. This product may contain alcohol. Ask your pharmacist or healthcare provider if this medicine contains alcohol. Be sure to tell all healthcare providers you are taking this medicine. Certain medicines, like metronidazole and disulfiram, can cause an unpleasant reaction when taken with alcohol. The reaction includes flushing, headache, nausea, vomiting, sweating, and increased thirst. The  reaction can last from 30 minutes to several hours. What side effects may I notice from receiving this medicine? Side effects that you should report to your doctor or health care professional as soon as possible: -allergic reactions like skin rash, itching or hives, swelling of the face, lips, or tongue -low blood counts - This drug may decrease the number of white blood cells, red blood cells and platelets. You may be at increased risk for infections and bleeding. -signs of infection - fever or chills, cough, sore throat, pain or difficulty passing urine -signs of decreased platelets or bleeding - bruising, pinpoint red spots on the skin, black, tarry stools, nosebleeds -signs of decreased red blood cells - unusually weak or tired, fainting spells, lightheadedness -breathing problems -chest pain -high or low blood pressure -mouth sores -nausea and vomiting -pain, swelling, redness or irritation at the injection site -pain, tingling, numbness in the hands or feet -slow or irregular heartbeat -swelling of the ankle, feet, hands Side effects that usually do not require medical attention (report to your doctor or health care professional if they continue or are bothersome): -bone pain -complete hair loss including hair on your head, underarms, pubic hair, eyebrows, and eyelashes -changes in the color of fingernails -diarrhea -loosening of the fingernails -loss of appetite -muscle or joint pain -red flush to skin -sweating This list may not describe all possible side effects. Call your doctor for medical advice about side effects. You may report side effects to FDA at 1-800-FDA-1088. Where should I keep my medicine? This drug is given in a hospital or clinic and will not be stored at  home. NOTE: This sheet is a summary. It may not cover all possible information. If you have questions about this medicine, talk to your doctor, pharmacist, or health care provider.    2016, Elsevier/Gold  Standard. (2015-05-14 13:02:56) Carboplatin injection What is this medicine? CARBOPLATIN (KAR boe pla tin) is a chemotherapy drug. It targets fast dividing cells, like cancer cells, and causes these cells to die. This medicine is used to treat ovarian cancer and many other cancers. This medicine may be used for other purposes; ask your health care provider or pharmacist if you have questions. What should I tell my health care provider before I take this medicine? They need to know if you have any of these conditions: -blood disorders -hearing problems -kidney disease -recent or ongoing radiation therapy -an unusual or allergic reaction to carboplatin, cisplatin, other chemotherapy, other medicines, foods, dyes, or preservatives -pregnant or trying to get pregnant -breast-feeding How should I use this medicine? This drug is usually given as an infusion into a vein. It is administered in a hospital or clinic by a specially trained health care professional. Talk to your pediatrician regarding the use of this medicine in children. Special care may be needed. Overdosage: If you think you have taken too much of this medicine contact a poison control center or emergency room at once. NOTE: This medicine is only for you. Do not share this medicine with others. What if I miss a dose? It is important not to miss a dose. Call your doctor or health care professional if you are unable to keep an appointment. What may interact with this medicine? -medicines for seizures -medicines to increase blood counts like filgrastim, pegfilgrastim, sargramostim -some antibiotics like amikacin, gentamicin, neomycin, streptomycin, tobramycin -vaccines Talk to your doctor or health care professional before taking any of these medicines: -acetaminophen -aspirin -ibuprofen -ketoprofen -naproxen This list may not describe all possible interactions. Give your health care provider a list of all the medicines, herbs,  non-prescription drugs, or dietary supplements you use. Also tell them if you smoke, drink alcohol, or use illegal drugs. Some items may interact with your medicine. What should I watch for while using this medicine? Your condition will be monitored carefully while you are receiving this medicine. You will need important blood work done while you are taking this medicine. This drug may make you feel generally unwell. This is not uncommon, as chemotherapy can affect healthy cells as well as cancer cells. Report any side effects. Continue your course of treatment even though you feel ill unless your doctor tells you to stop. In some cases, you may be given additional medicines to help with side effects. Follow all directions for their use. Call your doctor or health care professional for advice if you get a fever, chills or sore throat, or other symptoms of a cold or flu. Do not treat yourself. This drug decreases your body's ability to fight infections. Try to avoid being around people who are sick. This medicine may increase your risk to bruise or bleed. Call your doctor or health care professional if you notice any unusual bleeding. Be careful brushing and flossing your teeth or using a toothpick because you may get an infection or bleed more easily. If you have any dental work done, tell your dentist you are receiving this medicine. Avoid taking products that contain aspirin, acetaminophen, ibuprofen, naproxen, or ketoprofen unless instructed by your doctor. These medicines may hide a fever. Do not become pregnant while taking this medicine. Women  should inform their doctor if they wish to become pregnant or think they might be pregnant. There is a potential for serious side effects to an unborn child. Talk to your health care professional or pharmacist for more information. Do not breast-feed an infant while taking this medicine. What side effects may I notice from receiving this medicine? Side effects  that you should report to your doctor or health care professional as soon as possible: -allergic reactions like skin rash, itching or hives, swelling of the face, lips, or tongue -signs of infection - fever or chills, cough, sore throat, pain or difficulty passing urine -signs of decreased platelets or bleeding - bruising, pinpoint red spots on the skin, black, tarry stools, nosebleeds -signs of decreased red blood cells - unusually weak or tired, fainting spells, lightheadedness -breathing problems -changes in hearing -changes in vision -chest pain -high blood pressure -low blood counts - This drug may decrease the number of white blood cells, red blood cells and platelets. You may be at increased risk for infections and bleeding. -nausea and vomiting -pain, swelling, redness or irritation at the injection site -pain, tingling, numbness in the hands or feet -problems with balance, talking, walking -trouble passing urine or change in the amount of urine Side effects that usually do not require medical attention (report to your doctor or health care professional if they continue or are bothersome): -hair loss -loss of appetite -metallic taste in the mouth or changes in taste This list may not describe all possible side effects. Call your doctor for medical advice about side effects. You may report side effects to FDA at 1-800-FDA-1088. Where should I keep my medicine? This drug is given in a hospital or clinic and will not be stored at home. NOTE: This sheet is a summary. It may not cover all possible information. If you have questions about this medicine, talk to your doctor, pharmacist, or health care provider.    2016, Elsevier/Gold Standard. (2008-01-01 14:38:05)

## 2016-07-05 NOTE — Progress Notes (Signed)
  Radiation Oncology         [phone] ________________________________  Name: Renee Vance MRN: [id number]  Date: 07/05/2016  DOB: 20-Oct-1940  Simulation Verification Note    ICD-9-CM ICD-10-CM   1. 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 The clinical setup was verified. Then port films were obtained and uploaded to the radiation oncology medical record software.  The treatment beams were carefully compared against the planned radiation fields. The position location and shape of the radiation fields was reviewed. They targeted volume of tissue appears to be appropriately covered by the radiation beams. Organs at risk appear to be excluded as planned.  Based on my personal review, I approved the simulation verification. The patient's treatment will proceed as planned.  -----------------------------------  Blair Promise, PhD, MD

## 2016-07-05 NOTE — Progress Notes (Signed)
  Radiation Oncology         [phone] ________________________________  Name: Renee Vance MRN: [id number]  Date: 07/05/2016  DOB: 12-01-1940  Weekly Radiation Therapy Management    ICD-9-CM ICD-10-CM   1. 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 . . . . . . . The patient presents for routine under treatment assessment.                                   The patient is without complaint. Received chemotherapy earlier today                                 Set-up films were reviewed.                                 The chart was checked. Physical Findings. .  Vitals - 1 value per visit 9/72/8206  SYSTOLIC 015  DIASTOLIC 69  Pulse 55  Temperature 97  Respirations 20  Weight (lb)   Height   BMI   VISIT REPORT     Weight essentially stable.  No significant changes. Lungs clear. Heart regular rhythm and rate Impression . . . . . . . The patient is tolerating radiation. Plan . . . . . . . . . . . . Continue treatment as planned.  ________________________________   Blair Promise, PhD, MD

## 2016-07-05 NOTE — Progress Notes (Signed)
Introduced myself as her FA.  Informed pt unfortunately there aren't any foundations offering copay assistance for her Dx.  I offered the [place], went over what it covers and gave her an expense sheet.  She would like to apply so she will bring her proof of income on 07/06/16.  She has my card for any questions or concerns she may have in the future.

## 2016-07-05 NOTE — Progress Notes (Addendum)
Weekly rad txs 1/25 left lung completed, pt education done, Elmo Putt RN business card, sonafibne cream, and Radiation therapy and you book given to patient,discussed ways to manage, skin irritation, throat changes,  Burning, difficulty swallowing, may need to eat softer foods, smaller meals 5-6 instaed of 3 big meals, pain, fatigue, apply sonafine to left chest area after radiation daily and prn ,just not 4 hours prior to radiation treatmment, increase high calorie foods and drinks, increase protein in diet, , sees MD ,RN weekly and as needed, teach back given, patient does have redness on mid chest breast area, states"It;s  yeast  And has been there since mamaogram, using hydrocortisone for the itching" 2:33 PM

## 2016-07-06 ENCOUNTER — Encounter: Payer: Self-pay | Admitting: Internal Medicine

## 2016-07-06 ENCOUNTER — Ambulatory Visit
Admission: RE | Admit: 2016-07-06 | Discharge: 2016-07-06 | Disposition: A | Discharge status: Home / Self Care | Payer: PPO | Source: Ambulatory Visit | Attending: Radiation Oncology | Admitting: Radiation Oncology

## 2016-07-06 DIAGNOSIS — Z510 Encounter for antineoplastic radiation therapy: Secondary | ICD-10-CM | POA: Diagnosis not present

## 2016-07-06 DIAGNOSIS — C3412 Malignant neoplasm of upper lobe, left bronchus or lung: Secondary | ICD-10-CM | POA: Diagnosis not present

## 2016-07-06 LAB — FUNGUS CULTURE WITH STAIN

## 2016-07-06 LAB — FUNGUS CULTURE RESULT

## 2016-07-06 LAB — FUNGAL ORGANISM REFLEX

## 2016-07-06 NOTE — Progress Notes (Signed)
Pt is approved for the $400 [facility] grant.  °

## 2016-07-07 ENCOUNTER — Ambulatory Visit
Admission: RE | Admit: 2016-07-07 | Discharge: 2016-07-07 | Disposition: A | Discharge status: Home / Self Care | Payer: PPO | Source: Ambulatory Visit | Attending: Radiation Oncology | Admitting: Radiation Oncology

## 2016-07-07 ENCOUNTER — Telehealth: Payer: Self-pay | Admitting: *Deleted

## 2016-07-07 DIAGNOSIS — Z510 Encounter for antineoplastic radiation therapy: Secondary | ICD-10-CM | POA: Diagnosis not present

## 2016-07-07 DIAGNOSIS — C3412 Malignant neoplasm of upper lobe, left bronchus or lung: Secondary | ICD-10-CM | POA: Diagnosis not present

## 2016-07-07 NOTE — Telephone Encounter (Signed)
Oncology Nurse Navigator Documentation  Oncology Nurse Navigator Flowsheets 07/07/2016  Navigator Encounter Type Telephone/patient called yesterday and would like results of MRI Brain.  I updated Dr. Worthy Flank scan and he stated I could update.  Patient also was told by Rad Onc that they would like to change her scheduled on 10/16 and 10/23 if they had cancellations.  I will update rad team.    Telephone Outgoing Call  Treatment Phase Treatment  Barriers/Navigation Needs Coordination of Care  Education Other  Interventions Coordination of Care;Education Method  Coordination of Care Appts  Education Method Verbal  Acuity Level 2  Acuity Level 2 Other  Time Spent with Patient 30

## 2016-07-08 ENCOUNTER — Ambulatory Visit
Admission: RE | Admit: 2016-07-08 | Discharge: 2016-07-08 | Disposition: A | Discharge status: Home / Self Care | Payer: PPO | Source: Ambulatory Visit | Attending: Radiation Oncology | Admitting: Radiation Oncology

## 2016-07-08 DIAGNOSIS — C3412 Malignant neoplasm of upper lobe, left bronchus or lung: Secondary | ICD-10-CM | POA: Diagnosis not present

## 2016-07-08 DIAGNOSIS — Z510 Encounter for antineoplastic radiation therapy: Secondary | ICD-10-CM | POA: Diagnosis not present

## 2016-07-11 ENCOUNTER — Ambulatory Visit (HOSPITAL_BASED_OUTPATIENT_CLINIC_OR_DEPARTMENT_OTHER): Payer: PPO | Admitting: Oncology

## 2016-07-11 ENCOUNTER — Ambulatory Visit (HOSPITAL_BASED_OUTPATIENT_CLINIC_OR_DEPARTMENT_OTHER): Payer: PPO

## 2016-07-11 ENCOUNTER — Other Ambulatory Visit (HOSPITAL_BASED_OUTPATIENT_CLINIC_OR_DEPARTMENT_OTHER): Payer: PPO

## 2016-07-11 ENCOUNTER — Ambulatory Visit
Admission: RE | Admit: 2016-07-11 | Discharge: 2016-07-11 | Disposition: A | Discharge status: Home / Self Care | Payer: PPO | Source: Ambulatory Visit | Attending: Radiation Oncology | Admitting: Radiation Oncology

## 2016-07-11 ENCOUNTER — Ambulatory Visit (HOSPITAL_BASED_OUTPATIENT_CLINIC_OR_DEPARTMENT_OTHER): Payer: PPO | Admitting: Nurse Practitioner

## 2016-07-11 ENCOUNTER — Encounter: Payer: Self-pay | Admitting: Oncology

## 2016-07-11 VITALS — BP 128/64 | HR 55 | Temp 98.4°F | Resp 16 | Ht 64.0 in | Wt 151.7 lb

## 2016-07-11 DIAGNOSIS — Z5111 Encounter for antineoplastic chemotherapy: Secondary | ICD-10-CM | POA: Diagnosis not present

## 2016-07-11 DIAGNOSIS — C3492 Malignant neoplasm of unspecified part of left bronchus or lung: Secondary | ICD-10-CM

## 2016-07-11 DIAGNOSIS — C3412 Malignant neoplasm of upper lobe, left bronchus or lung: Secondary | ICD-10-CM

## 2016-07-11 DIAGNOSIS — Z510 Encounter for antineoplastic radiation therapy: Secondary | ICD-10-CM | POA: Diagnosis not present

## 2016-07-11 LAB — COMPREHENSIVE METABOLIC PANEL
ALK PHOS: 94 U/L (ref 40–150)
ALT: 17 U/L (ref 0–55)
ANION GAP: 10 meq/L (ref 3–11)
AST: 21 U/L (ref 5–34)
Albumin: 3.1 g/dL — ABNORMAL LOW (ref 3.5–5.0)
BUN: 36.1 mg/dL — AB (ref 7.0–26.0)
CALCIUM: 9.8 mg/dL (ref 8.4–10.4)
CHLORIDE: 105 meq/L (ref 98–109)
CO2: 23 mEq/L (ref 22–29)
Creatinine: 1.4 mg/dL — ABNORMAL HIGH (ref 0.6–1.1)
EGFR: 35 mL/min/{1.73_m2} — AB (ref >90)
Glucose: 93 mg/dl (ref 70–140)
POTASSIUM: 4.5 meq/L (ref 3.5–5.1)
Sodium: 138 mEq/L (ref 136–145)
Total Bilirubin: 0.73 mg/dL (ref 0.20–1.20)
Total Protein: 7.5 g/dL (ref 6.4–8.3)

## 2016-07-11 LAB — CBC WITH DIFFERENTIAL/PLATELET
BASO%: 0.2 % (ref 0.0–2.0)
BASOS ABS: 0 10*3/uL (ref 0.0–0.1)
EOS ABS: 0.1 10*3/uL (ref 0.0–0.5)
EOS%: 1.2 % (ref 0.0–7.0)
HEMATOCRIT: 36.7 % (ref 34.8–46.6)
HGB: 11.8 g/dL (ref 11.6–15.9)
LYMPH#: 0.5 10*3/uL — AB (ref 0.9–3.3)
LYMPH%: 9.1 % — ABNORMAL LOW (ref 14.0–49.7)
MCH: 28 pg (ref 25.1–34.0)
MCHC: 32.2 g/dL (ref 31.5–36.0)
MCV: 87 fL (ref 79.5–101.0)
MONO#: 0.3 10*3/uL (ref 0.1–0.9)
MONO%: 5.9 % (ref 0.0–14.0)
NEUT#: 4.7 10*3/uL (ref 1.5–6.5)
NEUT%: 83.6 % — AB (ref 38.4–76.8)
Platelets: 229 10*3/uL (ref 145–400)
RBC: 4.22 10*6/uL (ref 3.70–5.45)
RDW: 14.4 % (ref 11.2–14.5)
WBC: 5.6 10*3/uL (ref 3.9–10.3)

## 2016-07-11 MED ORDER — DIPHENHYDRAMINE HCL 50 MG/ML IJ SOLN
50.0000 mg | Freq: Once | INTRAMUSCULAR | Status: AC
  Administered 2016-07-11: 50 mg via INTRAVENOUS

## 2016-07-11 MED ORDER — PALONOSETRON HCL INJECTION 0.25 MG/5ML
0.2500 mg | Freq: Once | INTRAVENOUS | Status: AC
  Administered 2016-07-11: 0.25 mg via INTRAVENOUS

## 2016-07-11 MED ORDER — SODIUM CHLORIDE 0.9 % IV SOLN
Freq: Once | INTRAVENOUS | Status: AC
  Administered 2016-07-11: 12:00:00 via INTRAVENOUS

## 2016-07-11 MED ORDER — CARBOPLATIN CHEMO INJECTION 450 MG/45ML
130.0000 mg | Freq: Once | INTRAVENOUS | Status: AC
  Administered 2016-07-11: 130 mg via INTRAVENOUS
  Filled 2016-07-11: qty 13

## 2016-07-11 MED ORDER — SODIUM CHLORIDE 0.9 % IV SOLN
20.0000 mg | Freq: Once | INTRAVENOUS | Status: AC
  Administered 2016-07-11: 20 mg via INTRAVENOUS
  Filled 2016-07-11: qty 2

## 2016-07-11 MED ORDER — FAMOTIDINE IN NACL 20-0.9 MG/50ML-% IV SOLN
20.0000 mg | Freq: Once | INTRAVENOUS | Status: AC
  Administered 2016-07-11: 20 mg via INTRAVENOUS

## 2016-07-11 MED ORDER — PALONOSETRON HCL INJECTION 0.25 MG/5ML
INTRAVENOUS | Status: AC
  Filled 2016-07-11: qty 5

## 2016-07-11 MED ORDER — SODIUM CHLORIDE 0.9 % IV SOLN
45.0000 mg/m2 | Freq: Once | INTRAVENOUS | Status: AC
  Administered 2016-07-11: 78 mg via INTRAVENOUS
  Filled 2016-07-11: qty 13

## 2016-07-11 MED ORDER — FAMOTIDINE IN NACL 20-0.9 MG/50ML-% IV SOLN
INTRAVENOUS | Status: AC
  Filled 2016-07-11: qty 50

## 2016-07-11 MED ORDER — DIPHENHYDRAMINE HCL 50 MG/ML IJ SOLN
INTRAMUSCULAR | Status: AC
  Filled 2016-07-11: qty 1

## 2016-07-11 NOTE — Progress Notes (Signed)
No images are attached to the encounter. No scans are attached to the encounter. No scans are attached to the encounter. [facility] SHARED VISIT PROGRESS NOTE  [place], PA-C [address] [place] [address]  DIAGNOSIS: Stage IIa (T2a, N1, M0) non-small cell lung cancer, squamous cell carcinoma presented with a left hilar mass diagnosed in August 2017.  PRIOR THERAPY: None  CURRENT THERAPY: Current chemoradiation with weekly carboplatin for an AUC of 2 and paclitaxel 45 mg meter squared started 07/05/2016.  INTERVAL HISTORY: Renee Vance 75 y.o. female returns for routine follow-up prior to her chemotherapy. She tolerated her first cycle of carboplatin and Taxol without difficulty. Reports that her appetite has been fair, but her weight is stable. She denies fevers, chills, chest pain, shortness of breath, abdominal pain, nausea, and vomiting. Denies neuropathy. The patient had an MRI of the brain for staging workup and is here to discuss the results. She is also scheduled for cycle 2 of her weekly chemotherapy.  MEDICAL HISTORY: Past Medical History:  Diagnosis Date  . Bradycardia   . CAD (coronary artery disease)   . COPD (chronic obstructive pulmonary disease) ([place])   . Coronary atherosclerosis of native coronary artery 2011  . Dyspnea   . Dyspnea   . Encounter for antineoplastic chemotherapy 06/23/2016  . Fibrocystic breast   . HTN (hypertension)   . HTN (hypertension)   . Hyperlipidemia   . Hypothyroidism   . Obesity   . Pain in joint   . 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) 06/23/2016    ALLERGIES:  is allergic to ampicillin and codeine.  MEDICATIONS:  Current Outpatient Prescriptions  Medication Sig Dispense Refill  . amLODipine (NORVASC) 5 MG tablet Take 5 mg by mouth daily.    Marland Kitchen aspirin 81 MG tablet Take 81 mg by mouth daily.    Marland Kitchen atorvastatin (LIPITOR) 80 MG tablet Take 80 mg by mouth daily.    Marland Kitchen levothyroxine  (SYNTHROID, LEVOTHROID) 125 MCG tablet Take 125 mcg by mouth daily before breakfast.     . losartan (COZAAR) 100 MG tablet Take 100 mg by mouth daily.    . metoprolol succinate (TOPROL-XL) 50 MG 24 hr tablet Take 1 tablet (50 mg total) by mouth 2 (two) times daily. 180 tablet 1  . prochlorperazine (COMPAZINE) 10 MG tablet Take 1 tablet (10 mg total) by mouth every 6 (six) hours as needed for nausea or vomiting. 30 tablet 0  . tiotropium (SPIRIVA) 18 MCG inhalation capsule Place 18 mcg into inhaler and inhale daily.    . Vitamin D, Cholecalciferol, 1000 UNITS CAPS Take 1,000 Units by mouth daily.     Marland Kitchen warfarin (COUMADIN) 5 MG tablet Take as directed by Coumadin clinic. (Patient taking differently: Take 2.5-5 mg by mouth daily. Take 2.'5mg'$  on Monday, Wednesday, Friday and Saturday. All other days of the week take '5mg'$ ) 90 tablet 3   No current facility-administered medications for this visit.    Facility-Administered Medications Ordered in Other Visits  Medication Dose Route Frequency Provider Last Rate Last Dose  . CARBOplatin (PARAPLATIN) 130 mg in sodium chloride 0.9 % 100 mL chemo infusion  130 mg Intravenous Once [name], MD      . dexamethasone (DECADRON) 20 mg in sodium chloride 0.9 % 50 mL IVPB  20 mg Intravenous Once [name], MD   20 mg at 07/11/16 1239  . diphenhydrAMINE (BENADRYL) injection 50 mg  50 mg Intravenous Once [facility]  Julien Nordmann, MD      . PACLitaxel (TAXOL) 78 mg in sodium chloride 0.9 % 250 mL chemo infusion (</= '80mg'$ /m2)  45 mg/m2 (Treatment Plan Recorded) Intravenous Once [name], MD      . palonosetron (ALOXI) injection 0.25 mg  0.25 mg Intravenous Once [name], MD        SURGICAL HISTORY:  Past Surgical History:  Procedure Laterality Date  . CHOLECYSTECTOMY    . CORONARY ANGIOPLASTY WITH STENT PLACEMENT  2011   Lmain 30-40%, LAD 65-75% (FFR 0.88), CFX 55-60%, RCA 95%>0 w/ 2.5 x 12 mm monorail stent  . TUBAL LIGATION    . VIDEO  BRONCHOSCOPY Bilateral 06/07/2016   Procedure: VIDEO BRONCHOSCOPY WITH FLUORO;  Surgeon: [name], MD;  Location: [facility];  Service: Cardiopulmonary;  Laterality: Bilateral;    REVIEW OF SYSTEMS:  Review of Systems  Constitutional: Negative.   HENT: Negative.   Eyes: Negative.   Respiratory: Negative.   Cardiovascular: Negative.   Gastrointestinal: Negative.   Genitourinary: Negative.   Musculoskeletal: Negative.   Skin: Negative.   Neurological: Negative.   Endo/Heme/Allergies: Negative.   Psychiatric/Behavioral: Negative.      PHYSICAL EXAMINATION: Physical Exam  Constitutional: She is oriented to person, place, and time and well-developed, well-nourished, and in no distress.  HENT:  Head: Normocephalic and atraumatic.  Mouth/Throat: Oropharynx is clear and moist.  Eyes: Conjunctivae and EOM are normal. Pupils are equal, round, and reactive to light. No scleral icterus.  Neck: Normal range of motion. Neck supple. No tracheal deviation present. No thyromegaly present.  Cardiovascular: Normal rate, regular rhythm and normal heart sounds.   Pulmonary/Chest: Effort normal and breath sounds normal. No respiratory distress. She has no wheezes. She has no rales.  Abdominal: Soft. Bowel sounds are normal. She exhibits no distension and no mass. There is no tenderness.  Musculoskeletal: Normal range of motion. She exhibits no edema.  Lymphadenopathy:    She has no cervical adenopathy.  Neurological: She is alert and oriented to person, place, and time. Gait normal.  Skin: Skin is warm and dry.  Psychiatric: Mood, memory, affect and judgment normal.  Nursing note and vitals reviewed.   ECOG PERFORMANCE STATUS: 1 - Symptomatic but completely ambulatory  Blood pressure 128/64, pulse (!) 55, temperature 98.4 F (36.9 C), temperature source Oral, resp. rate 16, height '5\' 4"'$  (1.626 m), weight 151 lb 11.2 oz (68.8 kg), SpO2 97 %.  LABORATORY DATA: Lab Results  Component Value  Date   WBC 5.6 07/11/2016   HGB 11.8 07/11/2016   HCT 36.7 07/11/2016   MCV 87.0 07/11/2016   PLT 229 07/11/2016      Chemistry      Component Value Date/Time   NA 138 07/11/2016 0909   K 4.5 07/11/2016 0909   CL 105 05/29/2016 0426   CO2 23 07/11/2016 0909   BUN 36.1 (H) 07/11/2016 0909   CREATININE 1.4 (H) 07/11/2016 0909      Component Value Date/Time   CALCIUM 9.8 07/11/2016 0909   ALKPHOS 94 07/11/2016 0909   AST 21 07/11/2016 0909   ALT 17 07/11/2016 0909   BILITOT 0.73 07/11/2016 0909       RADIOGRAPHIC STUDIES:  Mr Jeri Cos JJ Contrast  Result Date: 07/04/2016 CLINICAL DATA:  75 y/o F; Left upper lobe pulmonary mass, without intracranial metastasis. EXAM: MRI HEAD WITHOUT AND WITH CONTRAST TECHNIQUE: Multiplanar, multiecho pulse sequences of the brain and surrounding structures were obtained without and with intravenous contrast. CONTRAST:  46m MULTIHANCE GADOBENATE DIMEGLUMINE 529 MG/ML IV SOLN COMPARISON:  None. FINDINGS: Brain: No acute infarction, hemorrhage, hydrocephalus, extra-axial collection or mass lesion. No abnormal enhancement. Moderate chronic microvascular ischemic changes. Mild parenchymal volume loss. Vascular: Normal flow voids. Skull and upper cervical spine: Normal marrow signal. Sinuses/Orbits: Negative. Other: None. IMPRESSION: No evidence for intracranial metastatic disease. Electronically Signed   By: LKristine GarbeM.D.   On: 07/04/2016 17:32   Nm Pet Image Initial (pi) Skull Base To Thigh  Result Date: 06/17/2016 CLINICAL DATA:  Initial treatment strategy for left upper lobe pulmonary mass. EXAM: NUCLEAR MEDICINE PET SKULL BASE TO THIGH TECHNIQUE: 7.7 mCi F-18 FDG was injected intravenously. Full-ring PET imaging was performed from the skull base to thigh after the radiotracer. CT data was obtained and used for attenuation correction and anatomic localization. FASTING BLOOD GLUCOSE:  Value: 110 mg/dl COMPARISON:  CT on 06/02/2016  FINDINGS: NECK No hypermetabolic lymph nodes in the neck. CHEST A 3.4 cm mass in the central left upper lobe abuts the left hilum and causes obstruction of the central lingular bronchus. This has SUV max of 30.0. This causes postobstructive pneumonitis in the peripheral lingula. No hypermetabolic mediastinal or right hilar lymph nodes are identified. No other suspicious pulmonary nodules identified by CT. ABDOMEN/PELVIS No abnormal hypermetabolic activity within the liver, pancreas, adrenal glands, or spleen. No hypermetabolic lymph nodes in the abdomen or pelvis. Prior cholecystectomy.  Aortic atherosclerosis. SKELETON No focal hypermetabolic activity to suggest skeletal metastasis. IMPRESSION: 3.4 cm hypermetabolic central left upper lobe mass which involves the left hilum, consistent with primary bronchogenic carcinoma. This causes postobstructive pneumonitis within the lingula. No other hypermetabolic thoracic lymphadenopathy or distant metastatic disease. Electronically Signed   By: JEarle GellM.D.   On: 06/17/2016 14:12     ASSESSMENT/PLAN:  No problem-specific Assessment & Plan notes found for this encounter. This is a pleasant 75year old female recently diagnosed with a stage IIIa (T2a, N1, M0) non-small cell lung cancer, squamous cell carcinoma who presented with a left hilar mass diagnosed in August 2017. MRI of the brain was performed for staging purposes it was negative for metastatic disease. Results were discussed with the patient and her husband today. She is currently undergoing concurrent chemoradiation therapy with carboplatin for an AUC of 2 and paclitaxel 45 mg meter squared. She is status post 1 cycle which she tolerated well. Recommend that she proceed with cycle 2 of her chemotherapy as scheduled today.  The plan is for her to return next week for labs and chemotherapy and in 2 weeks for labs, an office visit, and chemotherapy. She will continue radiation under the care of  radiation oncology.  All questions were answered. The patient knows to call the clinic with any problems, questions or concerns. We can certainly see the patient much sooner if necessary.  The patient was seen and examined with Dr. MJulien Nordmannin the office today.  CMikey Bussing [place] BC, AOCNP 07/11/16  ADDENDUM: Hematology/Oncology Attending: I had a face to face encounter with the patient today. I recommended her care plan. This is a very pleasant 75years old white female recently diagnosed with unresectable stage IIa non-small cell lung cancer, squamous cell carcinoma and she is currently undergoing concurrent chemoradiation with weekly carboplatin and paclitaxel is status post 1 cycle. The patient related the first cycle of her treatment well with no significant adverse effects. Her recent MRI of the brain showed no evidence for metastatic disease to the brain. I recommended for the  patient to proceed with cycle #2 today as a scheduled. I will see her back for follow-up visit in 2 weeks for reevaluation and management of any adverse effect of her treatment. The patient was advised to call immediately if she has any concerning symptoms in the interval.  Disclaimer: This note was dictated with voice recognition software. Similar sounding words can inadvertently be transcribed and may be missed upon review. [name]., MD 07/11/16

## 2016-07-11 NOTE — Patient Instructions (Signed)
[facility] Discharge Instructions for Patients Receiving Chemotherapy  Today you received the following chemotherapy agents Taxol/Carboplatin  To help prevent nausea and vomiting after your treatment, we encourage you to take your nausea medication    If you develop nausea and vomiting that is not controlled by your nausea medication, call the clinic.   BELOW ARE SYMPTOMS THAT SHOULD BE REPORTED IMMEDIATELY:  *FEVER GREATER THAN 100.5 F  *CHILLS WITH OR WITHOUT FEVER  NAUSEA AND VOMITING THAT IS NOT CONTROLLED WITH YOUR NAUSEA MEDICATION  *UNUSUAL SHORTNESS OF BREATH  *UNUSUAL BRUISING OR BLEEDING  TENDERNESS IN MOUTH AND THROAT WITH OR WITHOUT PRESENCE OF ULCERS  *URINARY PROBLEMS  *BOWEL PROBLEMS  UNUSUAL RASH Items with * indicate a potential emergency and should be followed up as soon as possible.  Feel free to call the clinic you have any questions or concerns. The clinic phone number is [phone].  Please show the CHEMO ALERT CARD at check-in to the Emergency Department and triage nurse.   

## 2016-07-12 ENCOUNTER — Encounter: Payer: Self-pay | Admitting: Radiation Oncology

## 2016-07-12 ENCOUNTER — Ambulatory Visit
Admission: RE | Admit: 2016-07-12 | Discharge: 2016-07-12 | Disposition: A | Discharge status: Home / Self Care | Payer: PPO | Source: Ambulatory Visit | Attending: Radiation Oncology | Admitting: Radiation Oncology

## 2016-07-12 ENCOUNTER — Telehealth: Payer: Self-pay | Admitting: *Deleted

## 2016-07-12 ENCOUNTER — Telehealth: Payer: Self-pay

## 2016-07-12 ENCOUNTER — Encounter: Payer: Self-pay | Admitting: Nurse Practitioner

## 2016-07-12 VITALS — BP 127/64 | HR 55 | Temp 98.2°F | Ht 64.0 in | Wt 152.0 lb

## 2016-07-12 DIAGNOSIS — C3412 Malignant neoplasm of upper lobe, left bronchus or lung: Secondary | ICD-10-CM | POA: Diagnosis not present

## 2016-07-12 DIAGNOSIS — C3492 Malignant neoplasm of unspecified part of left bronchus or lung: Secondary | ICD-10-CM

## 2016-07-12 DIAGNOSIS — Z510 Encounter for antineoplastic radiation therapy: Secondary | ICD-10-CM | POA: Diagnosis not present

## 2016-07-12 NOTE — Telephone Encounter (Signed)
Received call from pt stating that she is running a little late but is almost here.  Notified Rad Onc.

## 2016-07-12 NOTE — Progress Notes (Signed)
SYMPTOM MANAGEMENT CLINIC    Chief Complaint: Cold arm  HPI:  Renee Vance 75 y.o. female diagnosed with lung cancer.  Currently undergoing carboplatin/Taxol chemotherapy regimen; as well as radiation treatments.    No history exists.    Review of Systems  Skin:       Right arm at site of peripheral IV insertion site felt cool to the patient.  All other systems reviewed and are negative.   Past Medical History:  Diagnosis Date  . Bradycardia   . CAD (coronary artery disease)   . COPD (chronic obstructive pulmonary disease) ([place])   . Coronary atherosclerosis of native coronary artery 2011  . Dyspnea   . Dyspnea   . Encounter for antineoplastic chemotherapy 06/23/2016  . Fibrocystic breast   . HTN (hypertension)   . HTN (hypertension)   . Hyperlipidemia   . Hypothyroidism   . Obesity   . Pain in joint   . Stage II squamous cell carcinoma of left lung ([place]) 06/23/2016    Past Surgical History:  Procedure Laterality Date  . CHOLECYSTECTOMY    . CORONARY ANGIOPLASTY WITH STENT PLACEMENT  2011   Lmain 30-40%, LAD 65-75% (FFR 0.88), CFX 55-60%, RCA 95%>0 w/ 2.5 x 12 mm monorail stent  . TUBAL LIGATION    . VIDEO BRONCHOSCOPY Bilateral 06/07/2016   Procedure: VIDEO BRONCHOSCOPY WITH FLUORO;  Surgeon: [name], MD;  Location: [facility];  Service: Cardiopulmonary;  Laterality: Bilateral;    has Hyperlipidemia; HTN (hypertension); Pain in joint; Coronary atherosclerosis of native coronary artery; Dyspnea; Bradycardia; Hypothyroidism; Obesity; Fibrocystic breast; COPD (chronic obstructive pulmonary disease) ([place]); Premature atrial contractions; PAF (paroxysmal atrial fibrillation) ([place]); Encounter for therapeutic drug monitoring; Chronic anticoagulation; Coronary artery disease involving native coronary artery of native heart without angina pectoris; Hemoptysis; Lung mass; Stage II squamous cell carcinoma of left lung ([place]); and Encounter for antineoplastic  chemotherapy on her problem list.    is allergic to ampicillin and codeine.    Medication List       Accurate as of 07/11/16 11:59 PM. Always use your most recent med list.          amLODipine 5 MG tablet Commonly known as:  NORVASC Take 5 mg by mouth daily.   aspirin 81 MG tablet Take 81 mg by mouth daily.   atorvastatin 80 MG tablet Commonly known as:  LIPITOR Take 80 mg by mouth daily.   levothyroxine 125 MCG tablet Commonly known as:  SYNTHROID, LEVOTHROID Take 125 mcg by mouth daily before breakfast.   losartan 100 MG tablet Commonly known as:  COZAAR Take 100 mg by mouth daily.   metoprolol succinate 50 MG 24 hr tablet Commonly known as:  TOPROL-XL Take 1 tablet (50 mg total) by mouth 2 (two) times daily.   prochlorperazine 10 MG tablet Commonly known as:  COMPAZINE Take 1 tablet (10 mg total) by mouth every 6 (six) hours as needed for nausea or vomiting.   tiotropium 18 MCG inhalation capsule Commonly known as:  SPIRIVA Place 18 mcg into inhaler and inhale daily.   Vitamin D (Cholecalciferol) 1000 units Caps Take 1,000 Units by mouth daily.   warfarin 5 MG tablet Commonly known as:  COUMADIN Take as directed by Coumadin clinic.        PHYSICAL EXAMINATION  Oncology Vitals 07/12/2016 07/12/2016  Height - 163 cm  Weight - 68.947 kg  Weight (lbs) - 152 lbs  BMI (kg/m2) - 26.09 kg/m2  Temp - 98.2  Pulse 55 52  Resp - -  SpO2 - 98  BSA (m2) - 1.76 m2   BP Readings from Last 2 Encounters:  07/12/16 127/64  07/11/16 128/64    Physical Exam  Constitutional: She is oriented to person, place, and time and well-developed, well-nourished, and in no distress.  HENT:  Head: Normocephalic and atraumatic.  Eyes: Pupils are equal, round, and reactive to light.  Neck: Normal range of motion.  Pulmonary/Chest: Effort normal. No respiratory distress.  Musculoskeletal: Normal range of motion. She exhibits no edema or tenderness.  Neurological: She is  alert and oriented to person, place, and time.  Skin: Skin is warm and dry. No rash noted. No erythema. No pallor.  Patient presented to the [facility] today to receive cycle 2 of her carboplatin/Taxol chemotherapy regimen.  She also continues to undergo daily radiation treatments.  Patient was undergoing her chemotherapy via peripheral IV insertion today; and complained of feeling a coldness in her right forearm.  There was no erythema, warmth, red streaks, or tenderness to the site.  There was also no infiltration/extravasation noted.  Peripheral IV, gave good blood return.  Patient was advised that the chemotherapy fluid was fairly cool; was most likely causing her arm to feel cold.  Patient's arms were wrapped in warm blankets.  Patient felt much better; had no other complaints.  She was able to complete all of her chemotherapy with no further issues whatsoever.  She was advised to call/return or go directly to the emergency department for any worsening symptoms whatsoever.  Psychiatric: Affect normal.  Nursing note and vitals reviewed.   LABORATORY DATA:. Appointment on 07/11/2016  Component Date Value Ref Range Status  . WBC 07/11/2016 5.6  3.9 - 10.3 10e3/uL Final  . NEUT# 07/11/2016 4.7  1.5 - 6.5 10e3/uL Final  . HGB 07/11/2016 11.8  11.6 - 15.9 g/dL Final  . HCT 07/11/2016 36.7  34.8 - 46.6 % Final  . Platelets 07/11/2016 229  145 - 400 10e3/uL Final  . MCV 07/11/2016 87.0  79.5 - 101.0 fL Final  . MCH 07/11/2016 28.0  25.1 - 34.0 pg Final  . MCHC 07/11/2016 32.2  31.5 - 36.0 g/dL Final  . RBC 07/11/2016 4.22  3.70 - 5.45 10e6/uL Final  . RDW 07/11/2016 14.4  11.2 - 14.5 % Final  . lymph# 07/11/2016 0.5* 0.9 - 3.3 10e3/uL Final  . MONO# 07/11/2016 0.3  0.1 - 0.9 10e3/uL Final  . Eosinophils Absolute 07/11/2016 0.1  0.0 - 0.5 10e3/uL Final  . Basophils Absolute 07/11/2016 0.0  0.0 - 0.1 10e3/uL Final  . NEUT% 07/11/2016 83.6* 38.4 - 76.8 % Final  . LYMPH% 07/11/2016 9.1* 14.0 -  49.7 % Final  . MONO% 07/11/2016 5.9  0.0 - 14.0 % Final  . EOS% 07/11/2016 1.2  0.0 - 7.0 % Final  . BASO% 07/11/2016 0.2  0.0 - 2.0 % Final  . Sodium 07/11/2016 138  136 - 145 mEq/L Final  . Potassium 07/11/2016 4.5  3.5 - 5.1 mEq/L Final  . Chloride 07/11/2016 105  98 - 109 mEq/L Final  . CO2 07/11/2016 23  22 - 29 mEq/L Final  . Glucose 07/11/2016 93  70 - 140 mg/dl Final  . BUN 07/11/2016 36.1* 7.0 - 26.0 mg/dL Final  . Creatinine 07/11/2016 1.4* 0.6 - 1.1 mg/dL Final  . Total Bilirubin 07/11/2016 0.73  0.20 - 1.20 mg/dL Final  . Alkaline Phosphatase 07/11/2016 94  40 - 150 U/L Final  .  AST 07/11/2016 21  5 - 34 U/L Final  . ALT 07/11/2016 17  0 - 55 U/L Final  . Total Protein 07/11/2016 7.5  6.4 - 8.3 g/dL Final  . Albumin 07/11/2016 3.1* 3.5 - 5.0 g/dL Final  . Calcium 07/11/2016 9.8  8.4 - 10.4 mg/dL Final  . Anion Gap 07/11/2016 10  3 - 11 mEq/L Final  . EGFR 07/11/2016 35* >90 ml/min/1.73 m2 Final    RADIOGRAPHIC STUDIES: No results found.  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  She also continues to undergo daily radiation treatments.  Patient was undergoing her chemotherapy via peripheral IV insertion today; and complained of feeling a coldness in her right forearm.  There was no erythema, warmth, red streaks, or tenderness to the site.  There was also no infiltration/extravasation noted.  Peripheral IV, gave good blood return.  Patient was advised that the chemotherapy fluid was fairly cool; was most likely causing her arm to feel cold.  Patient's arms were wrapped in warm blankets.  Patient felt much better; had no other complaints.  She was able to complete all of her chemotherapy with no further issues whatsoever.  She was advised to call/return or go directly to the emergency department for any worsening symptoms  whatsoever.   Patient stated understanding of all instructions; and was in agreement with this plan of care. The patient knows to call the clinic with any problems, questions or concerns.   Total time spent with patient was 15 minutes;  with greater than 75 percent of that time spent in face to face counseling regarding patient's symptoms,  and coordination of care and follow up.  Disclaimer:This dictation was prepared with Dragon/digital dictation along with [employer]. Any transcriptional errors that result from this process are unintentional.  Drue Second, NP 07/12/2016

## 2016-07-12 NOTE — Progress Notes (Signed)
  Radiation Oncology         [phone] ________________________________  Name: Renee Vance MRN: [id number]  Date: 07/12/2016  DOB: September 17, 1941  Weekly Radiation Therapy Management    ICD-9-CM ICD-10-CM   1. 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 . . . . . . . The patient presents for routine under treatment assessment.                                   The patient is without complaint. Received chemotherapy earlier today. Renee Vance has completed 6 fractions to her left lung.  She denies having pain, a sore throat or trouble swallowing.  She reports having a frequent cough with yellow sputum.  She reports having shortness of breath due to COPD.  She reports feeling off balance occasionally when she stands up and starts to walk.  Orthostatic vitals taken: bp sitting 128/78, hr 52, bp standing 127/64, hr 55.  She had chemotherapy yesterday.  She reports the miconazole powder is helping her skin.                                  Set-up films were reviewed.                                 The chart was checked. Physical Findings. .  BP 127/64 (BP Location: Right Arm, Patient Position: Sitting)   Pulse (!) 55   Temp 98.2 F (36.8 C) (Oral)   Ht '5\' 4"'$  (1.626 m)   Wt 152 lb (68.9 kg)   SpO2 98%   BMI 26.09 kg/m     Weight essentially stable.  No significant changes. Lungs clear. Heart regular rhythm and rate Impression . . . . . . . The patient is tolerating radiation. Plan . . . . . . . . . . . . Continue treatment as planned. Recommended increasing free water intake.  ________________________________   Blair Promise, PhD, MD  This document serves as a record of services personally performed by Gery Pray, MD. It was created on his behalf by Truddie Hidden, a trained medical scribe. The creation of this record is based on the scribe's personal observations and the provider's  statements to them. This document has been checked and approved by the attending provider.

## 2016-07-12 NOTE — Assessment & Plan Note (Signed)
Patient presented to the [facility] today to receive cycle 2 of her carboplatin/Taxol chemotherapy regimen.  She also continues to undergo daily radiation treatments.  Patient was undergoing her chemotherapy via peripheral IV insertion today; and complained of feeling a coldness in her right forearm.  There was no erythema, warmth, red streaks, or tenderness to the site.  There was also no infiltration/extravasation noted.  Peripheral IV, gave good blood return.  Patient was advised that the chemotherapy fluid was fairly cool; was most likely causing her arm to feel cold.  Patient's arms were wrapped in warm blankets.  Patient felt much better; had no other complaints.  She was able to complete all of her chemotherapy with no further issues whatsoever.  She was advised to call/return or go directly to the emergency department for any worsening symptoms whatsoever.

## 2016-07-12 NOTE — Telephone Encounter (Signed)
Patients husband stopped by to make a new appt per los from 10/2.  Renee Vance was scheduled as Dr Julien Nordmann was not available at the needed time.  Avs and calendar have been printed

## 2016-07-12 NOTE — Progress Notes (Signed)
Renee Vance has completed 6 fractions to her left lung.  She denies having pain, a sore throat or trouble swallowing.  She reports having a frequent cough with yellow sputum.  She reports having shortness of breath due to COPD.  She reports feeling off balance occasionally when she stands up and starts to walk.  Orthostatic vitals taken: bp sitting 128/78, hr 52, bp standing 127/64, hr 55.  She had chemotherapy yesterday.  She reports the miconazole powder is helping her skin.  BP 127/64 (BP Location: Right Arm, Patient Position: Sitting)   Pulse (!) 55   Temp 98.2 F (36.8 C) (Oral)   Ht '5\' 4"'$  (1.626 m)   Wt 152 lb (68.9 kg)   SpO2 98%   BMI 26.09 kg/m    Wt Readings from Last 3 Encounters:  07/12/16 152 lb (68.9 kg)  07/11/16 151 lb 11.2 oz (68.8 kg)  07/05/16 152 lb 14.4 oz (69.4 kg)

## 2016-07-13 ENCOUNTER — Telehealth: Payer: Self-pay | Admitting: *Deleted

## 2016-07-13 ENCOUNTER — Ambulatory Visit
Admission: RE | Admit: 2016-07-13 | Discharge: 2016-07-13 | Disposition: A | Discharge status: Home / Self Care | Payer: PPO | Source: Ambulatory Visit | Attending: Radiation Oncology | Admitting: Radiation Oncology

## 2016-07-13 ENCOUNTER — Telehealth: Payer: Self-pay | Admitting: Nurse Practitioner

## 2016-07-13 DIAGNOSIS — Z510 Encounter for antineoplastic radiation therapy: Secondary | ICD-10-CM | POA: Diagnosis not present

## 2016-07-13 DIAGNOSIS — C3412 Malignant neoplasm of upper lobe, left bronchus or lung: Secondary | ICD-10-CM | POA: Diagnosis not present

## 2016-07-13 NOTE — Telephone Encounter (Signed)
Oncology Nurse Navigator Documentation  Oncology Nurse Navigator Flowsheets 07/13/2016  Navigator Encounter Type Telephone/I received a vm message to call Renee Vance regarding her appt's.  I looked at schedule with [facility].  It looks like all appt are ok.  I called Renee Vance and left a vm message to call.    Telephone Outgoing Call  Treatment Phase Treatment  Barriers/Navigation Needs Education  Education Other  Interventions Education Method  Education Method Verbal  Acuity Level 2  Acuity Level 2 Educational needs  Time Spent with Patient 30

## 2016-07-13 NOTE — Telephone Encounter (Signed)
Oncology Nurse Navigator Documentation  Oncology Nurse Navigator Flowsheets 07/13/2016  Navigator Encounter Type Telephone/Ms. Weygandt called and left vm message to call.  I called back and was able to speak to Ms. Gaumond.  I clarified her schedule.  She was thankful for the call back  Telephone Outgoing Call  Treatment Phase Treatment  Barriers/Navigation Needs Education  Education Other  Interventions Education Method  Education Method Verbal  Acuity Level 1  Acuity Level 1 Minimal follow up required  Acuity Level 2 -  Time Spent with Patient 15

## 2016-07-13 NOTE — Telephone Encounter (Signed)
Called patient to check in with her after she received her last cycle of chemotherapy on 07/11/2016.  She had complained at that time of her right arm feeling slightly cold; the patient states that her arm is completely back to baseline and she has no difficulty or concerns with her arm whatsoever.

## 2016-07-14 ENCOUNTER — Telehealth: Payer: Self-pay | Admitting: *Deleted

## 2016-07-14 ENCOUNTER — Ambulatory Visit (INDEPENDENT_AMBULATORY_CARE_PROVIDER_SITE_OTHER): Payer: PPO | Admitting: *Deleted

## 2016-07-14 ENCOUNTER — Ambulatory Visit: Payer: PPO | Admitting: Internal Medicine

## 2016-07-14 ENCOUNTER — Other Ambulatory Visit: Payer: PPO

## 2016-07-14 ENCOUNTER — Ambulatory Visit
Admission: RE | Admit: 2016-07-14 | Discharge: 2016-07-14 | Disposition: A | Discharge status: Home / Self Care | Payer: PPO | Source: Ambulatory Visit | Attending: Radiation Oncology | Admitting: Radiation Oncology

## 2016-07-14 DIAGNOSIS — Z510 Encounter for antineoplastic radiation therapy: Secondary | ICD-10-CM | POA: Diagnosis not present

## 2016-07-14 DIAGNOSIS — Z5181 Encounter for therapeutic drug level monitoring: Secondary | ICD-10-CM

## 2016-07-14 DIAGNOSIS — I480 Paroxysmal atrial fibrillation: Secondary | ICD-10-CM | POA: Diagnosis not present

## 2016-07-14 DIAGNOSIS — C3412 Malignant neoplasm of upper lobe, left bronchus or lung: Secondary | ICD-10-CM | POA: Diagnosis not present

## 2016-07-14 LAB — POCT INR: INR: 2.3

## 2016-07-14 NOTE — Telephone Encounter (Signed)
Oncology Nurse Navigator Documentation  Oncology Nurse Navigator Flowsheets 07/14/2016  Navigator Encounter Type Telephone/Renee Vance called.  He left me a vm message about billing.  I called him back and update him to call his insurance and the billing office to help.   Telephone Outgoing Call  Treatment Phase Treatment  Barriers/Navigation Needs Education  Education Other  Interventions Education Method  Education Method Verbal  Acuity Level 2  Acuity Level 2 Educational needs  Time Spent with Patient 30

## 2016-07-15 ENCOUNTER — Ambulatory Visit
Admission: RE | Admit: 2016-07-15 | Discharge: 2016-07-15 | Disposition: A | Discharge status: Home / Self Care | Payer: PPO | Source: Ambulatory Visit | Attending: Radiation Oncology | Admitting: Radiation Oncology

## 2016-07-15 DIAGNOSIS — C3412 Malignant neoplasm of upper lobe, left bronchus or lung: Secondary | ICD-10-CM | POA: Diagnosis not present

## 2016-07-15 DIAGNOSIS — Z510 Encounter for antineoplastic radiation therapy: Secondary | ICD-10-CM | POA: Diagnosis not present

## 2016-07-18 ENCOUNTER — Ambulatory Visit
Admission: RE | Admit: 2016-07-18 | Discharge: 2016-07-18 | Disposition: A | Discharge status: Home / Self Care | Payer: PPO | Source: Ambulatory Visit | Attending: Radiation Oncology | Admitting: Radiation Oncology

## 2016-07-18 ENCOUNTER — Ambulatory Visit (HOSPITAL_BASED_OUTPATIENT_CLINIC_OR_DEPARTMENT_OTHER): Payer: PPO

## 2016-07-18 ENCOUNTER — Other Ambulatory Visit (HOSPITAL_BASED_OUTPATIENT_CLINIC_OR_DEPARTMENT_OTHER): Payer: PPO

## 2016-07-18 VITALS — BP 124/68 | HR 56 | Temp 98.1°F

## 2016-07-18 DIAGNOSIS — Z510 Encounter for antineoplastic radiation therapy: Secondary | ICD-10-CM | POA: Diagnosis not present

## 2016-07-18 DIAGNOSIS — C3412 Malignant neoplasm of upper lobe, left bronchus or lung: Secondary | ICD-10-CM | POA: Diagnosis not present

## 2016-07-18 DIAGNOSIS — Z5111 Encounter for antineoplastic chemotherapy: Secondary | ICD-10-CM | POA: Diagnosis not present

## 2016-07-18 DIAGNOSIS — C3492 Malignant neoplasm of unspecified part of left bronchus or lung: Secondary | ICD-10-CM

## 2016-07-18 LAB — CBC WITH DIFFERENTIAL/PLATELET
BASO%: 0.5 % (ref 0.0–2.0)
BASOS ABS: 0 10*3/uL (ref 0.0–0.1)
EOS%: 0.8 % (ref 0.0–7.0)
Eosinophils Absolute: 0 10*3/uL (ref 0.0–0.5)
HEMATOCRIT: 34.1 % — AB (ref 34.8–46.6)
HGB: 11.3 g/dL — ABNORMAL LOW (ref 11.6–15.9)
LYMPH%: 10.3 % — AB (ref 14.0–49.7)
MCH: 28.5 pg (ref 25.1–34.0)
MCHC: 33.1 g/dL (ref 31.5–36.0)
MCV: 85.9 fL (ref 79.5–101.0)
MONO#: 0.3 10*3/uL (ref 0.1–0.9)
MONO%: 7.7 % (ref 0.0–14.0)
NEUT#: 3.1 10*3/uL (ref 1.5–6.5)
NEUT%: 80.7 % — AB (ref 38.4–76.8)
Platelets: 187 10*3/uL (ref 145–400)
RBC: 3.97 10*6/uL (ref 3.70–5.45)
RDW: 14.2 % (ref 11.2–14.5)
WBC: 3.8 10*3/uL — ABNORMAL LOW (ref 3.9–10.3)
lymph#: 0.4 10*3/uL — ABNORMAL LOW (ref 0.9–3.3)

## 2016-07-18 LAB — COMPREHENSIVE METABOLIC PANEL
ALT: 17 U/L (ref 0–55)
AST: 20 U/L (ref 5–34)
Albumin: 3 g/dL — ABNORMAL LOW (ref 3.5–5.0)
Alkaline Phosphatase: 82 U/L (ref 40–150)
Anion Gap: 10 mEq/L (ref 3–11)
BUN: 31.3 mg/dL — AB (ref 7.0–26.0)
CALCIUM: 9.7 mg/dL (ref 8.4–10.4)
CHLORIDE: 106 meq/L (ref 98–109)
CO2: 24 meq/L (ref 22–29)
CREATININE: 1.3 mg/dL — AB (ref 0.6–1.1)
EGFR: 38 mL/min/{1.73_m2} — ABNORMAL LOW (ref >90)
Glucose: 103 mg/dl (ref 70–140)
POTASSIUM: 4.4 meq/L (ref 3.5–5.1)
Sodium: 139 mEq/L (ref 136–145)
Total Bilirubin: 0.66 mg/dL (ref 0.20–1.20)
Total Protein: 6.7 g/dL (ref 6.4–8.3)

## 2016-07-18 MED ORDER — DIPHENHYDRAMINE HCL 50 MG/ML IJ SOLN
50.0000 mg | Freq: Once | INTRAMUSCULAR | Status: AC
  Administered 2016-07-18: 50 mg via INTRAVENOUS

## 2016-07-18 MED ORDER — SODIUM CHLORIDE 0.9 % IV SOLN
20.0000 mg | Freq: Once | INTRAVENOUS | Status: AC
  Administered 2016-07-18: 20 mg via INTRAVENOUS
  Filled 2016-07-18: qty 2

## 2016-07-18 MED ORDER — PALONOSETRON HCL INJECTION 0.25 MG/5ML
INTRAVENOUS | Status: AC
  Filled 2016-07-18: qty 5

## 2016-07-18 MED ORDER — PALONOSETRON HCL INJECTION 0.25 MG/5ML
0.2500 mg | Freq: Once | INTRAVENOUS | Status: AC
  Administered 2016-07-18: 0.25 mg via INTRAVENOUS

## 2016-07-18 MED ORDER — DIPHENHYDRAMINE HCL 50 MG/ML IJ SOLN
INTRAMUSCULAR | Status: AC
  Filled 2016-07-18: qty 1

## 2016-07-18 MED ORDER — FAMOTIDINE IN NACL 20-0.9 MG/50ML-% IV SOLN
INTRAVENOUS | Status: AC
  Filled 2016-07-18: qty 50

## 2016-07-18 MED ORDER — SODIUM CHLORIDE 0.9 % IV SOLN
125.6000 mg | Freq: Once | INTRAVENOUS | Status: AC
  Administered 2016-07-18: 130 mg via INTRAVENOUS
  Filled 2016-07-18: qty 13

## 2016-07-18 MED ORDER — FAMOTIDINE IN NACL 20-0.9 MG/50ML-% IV SOLN
20.0000 mg | Freq: Once | INTRAVENOUS | Status: AC
Start: 2016-07-18 — End: 2016-07-18
  Administered 2016-07-18: 20 mg via INTRAVENOUS

## 2016-07-18 MED ORDER — PACLITAXEL CHEMO INJECTION 300 MG/50ML
45.0000 mg/m2 | Freq: Once | INTRAVENOUS | Status: AC
  Administered 2016-07-18: 78 mg via INTRAVENOUS
  Filled 2016-07-18: qty 13

## 2016-07-18 MED ORDER — SODIUM CHLORIDE 0.9 % IV SOLN
Freq: Once | INTRAVENOUS | Status: AC
  Administered 2016-07-18: 09:00:00 via INTRAVENOUS

## 2016-07-18 NOTE — Patient Instructions (Signed)
[facility] Discharge Instructions for Patients Receiving Chemotherapy  Today you received the following chemotherapy agents Taxol/Carboplatin  To help prevent nausea and vomiting after your treatment, we encourage you to take your nausea medication    If you develop nausea and vomiting that is not controlled by your nausea medication, call the clinic.   BELOW ARE SYMPTOMS THAT SHOULD BE REPORTED IMMEDIATELY:  *FEVER GREATER THAN 100.5 F  *CHILLS WITH OR WITHOUT FEVER  NAUSEA AND VOMITING THAT IS NOT CONTROLLED WITH YOUR NAUSEA MEDICATION  *UNUSUAL SHORTNESS OF BREATH  *UNUSUAL BRUISING OR BLEEDING  TENDERNESS IN MOUTH AND THROAT WITH OR WITHOUT PRESENCE OF ULCERS  *URINARY PROBLEMS  *BOWEL PROBLEMS  UNUSUAL RASH Items with * indicate a potential emergency and should be followed up as soon as possible.  Feel free to call the clinic you have any questions or concerns. The clinic phone number is [phone].  Please show the CHEMO ALERT CARD at check-in to the Emergency Department and triage nurse.   

## 2016-07-19 ENCOUNTER — Ambulatory Visit
Admission: RE | Admit: 2016-07-19 | Discharge: 2016-07-19 | Disposition: A | Discharge status: Home / Self Care | Payer: PPO | Source: Ambulatory Visit | Attending: Radiation Oncology | Admitting: Radiation Oncology

## 2016-07-19 ENCOUNTER — Encounter: Payer: Self-pay | Admitting: Radiation Oncology

## 2016-07-19 VITALS — BP 112/52 | HR 55 | Temp 98.4°F | Resp 18 | Ht 64.0 in | Wt 151.2 lb

## 2016-07-19 DIAGNOSIS — Z510 Encounter for antineoplastic radiation therapy: Secondary | ICD-10-CM | POA: Diagnosis not present

## 2016-07-19 DIAGNOSIS — C3492 Malignant neoplasm of unspecified part of left bronchus or lung: Secondary | ICD-10-CM

## 2016-07-19 DIAGNOSIS — C3412 Malignant neoplasm of upper lobe, left bronchus or lung: Secondary | ICD-10-CM | POA: Diagnosis not present

## 2016-07-19 NOTE — Progress Notes (Signed)
  Radiation Oncology         [phone] ________________________________  Name: Renee Vance MRN: [id number]  Date: 07/19/2016  DOB: September 05, 1941  Weekly Radiation Therapy Management    ICD-9-CM ICD-10-CM   1. 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 . . . . . . . The patient presents for routine under treatment assessment.                                   The patient is without complaint. has completed 11 fractions to her left lung. She denies having pain, a sore throat or trouble swallowing. She reports having a frequent cough with yellow sputum. She reports having shortness of breath more than normal has COPD. She reports feeling off balance occasionally when she stands up and starts to walk.  No dizziness. Orthostatic vitals taken: bp sitting 121/52, hr 52, bp standing 112/64, hr 55. She had chemotherapy yesterday. She reports the miconazole powder is helping her skin.  The patient reports that she has been having a lot of heart burning sensations as well.                                    Set-up films were reviewed.                                 The chart was checked. Physical Findings. .  Wt Readings from Last 3 Encounters:  07/19/16 151 lb 3.2 oz (68.6 kg)  07/12/16 152 lb (68.9 kg)  07/11/16 151 lb 11.2 oz (68.8 kg)  BP (!) 121/52 (BP Location: Right Arm, Patient Position: Sitting, Cuff Size: Normal)   Pulse (!) 54   Temp 98.4 F (36.9 C) (Oral)   Resp 18   Ht '5\' 4"'$  (1.626 m)   Wt 151 lb 3.2 oz (68.6 kg)   SpO2 97%   BMI 25.95 kg/m    Weight essentially stable.  No significant changes. Lungs clear. Heart regular rhythm and rate Impression . . . . . . . The patient is tolerating radiation. Plan . . . . . . . . . . . . Continue treatment as planned. I instructed the patient to try Prilosec or nexium to treat her heartburn. Also tums would be an option which she has  used in the past. ________________________________   Blair Promise, PhD, MD  This document serves as a record of services personally performed by Gery Pray, MD. It was created on his behalf by Truddie Hidden, a trained medical scribe. The creation of this record is based on the scribe's personal observations and the provider's statements to them. This document has been checked and approved by the attending provider.

## 2016-07-19 NOTE — Progress Notes (Addendum)
Renee Vance has completed 11 fractions to her left lung.  She denies having pain, a sore throat or trouble swallowing.  She reports having a frequent cough with yellow sputum.  She reports having shortness of breath more than normal has COPD.  She reports feeling off balance occasionally when she stands up and starts to walk.  No dizziness.  Orthostatic vitals taken: bp sitting 121/52, hr 52, bp standing 112/64, hr 55.  She had chemotherapy yesterday.  She reports the miconazole powder is helping her skin. Wt Readings from Last 3 Encounters:  07/19/16 151 lb 3.2 oz (68.6 kg)  07/12/16 152 lb (68.9 kg)  07/11/16 151 lb 11.2 oz (68.8 kg)  BP (!) 121/52 (BP Location: Right Arm, Patient Position: Sitting, Cuff Size: Normal)   Pulse (!) 54   Temp 98.4 F (36.9 C) (Oral)   Resp 18   Ht '5\' 4"'$  (1.626 m)   Wt 151 lb 3.2 oz (68.6 kg)   SpO2 97%   BMI 25.95 kg/m   Blood pressure (!) 112/52, pulse (!) 55, temperature 98.4 F (36.9 C), temperature source Oral, resp. rate 18, height '5\' 4"'$  (1.626 m), weight 151 lb 3.2 oz (68.6 kg), SpO2 97 %. BP (!) 112/52 (BP Location: Right Arm, Patient Position: Standing, Cuff Size: Normal)   Pulse (!) 55   Temp 98.4 F (36.9 C) (Oral)   Resp 18   Ht '5\' 4"'$  (1.626 m)   Wt 151 lb 3.2 oz (68.6 kg)   SpO2 97%   BMI 25.95 kg/m

## 2016-07-20 ENCOUNTER — Ambulatory Visit
Admission: RE | Admit: 2016-07-20 | Discharge: 2016-07-20 | Disposition: A | Discharge status: Home / Self Care | Payer: PPO | Source: Ambulatory Visit | Attending: Radiation Oncology | Admitting: Radiation Oncology

## 2016-07-20 DIAGNOSIS — Z510 Encounter for antineoplastic radiation therapy: Secondary | ICD-10-CM | POA: Diagnosis not present

## 2016-07-20 DIAGNOSIS — C3412 Malignant neoplasm of upper lobe, left bronchus or lung: Secondary | ICD-10-CM | POA: Diagnosis not present

## 2016-07-21 ENCOUNTER — Ambulatory Visit
Admission: RE | Admit: 2016-07-21 | Discharge: 2016-07-21 | Disposition: A | Discharge status: Home / Self Care | Payer: PPO | Source: Ambulatory Visit | Attending: Radiation Oncology | Admitting: Radiation Oncology

## 2016-07-21 ENCOUNTER — Telehealth: Payer: Self-pay | Admitting: *Deleted

## 2016-07-21 DIAGNOSIS — Z510 Encounter for antineoplastic radiation therapy: Secondary | ICD-10-CM | POA: Diagnosis not present

## 2016-07-21 DIAGNOSIS — C3412 Malignant neoplasm of upper lobe, left bronchus or lung: Secondary | ICD-10-CM | POA: Diagnosis not present

## 2016-07-21 LAB — ACID FAST CULTURE WITH REFLEXED SENSITIVITIES (MYCOBACTERIA): Acid Fast Culture: NEGATIVE

## 2016-07-21 NOTE — Telephone Encounter (Signed)
"  I have a problem with constipation.  I have not tried anything, do not want to interfere with the chemotherapy.  Last bm this morning was balls.  I eat and drink well.  No abdominal swelling, tenderness or pain" Advised she try  M.O.M, Gwendolyn Lima.  Says she will use Gwendolyn Lima.  Instructed how to use and to call Monday morning with an update.  Encouraged to drink more, try hot beverages, soups and broth.  Advised to use a daily regimen of senokot-S to regulate bowels.  Awaiting return call from patient.

## 2016-07-22 ENCOUNTER — Encounter: Payer: Self-pay | Admitting: *Deleted

## 2016-07-22 ENCOUNTER — Ambulatory Visit
Admission: RE | Admit: 2016-07-22 | Discharge: 2016-07-22 | Disposition: A | Discharge status: Home / Self Care | Payer: PPO | Source: Ambulatory Visit | Attending: Radiation Oncology | Admitting: Radiation Oncology

## 2016-07-22 DIAGNOSIS — C3412 Malignant neoplasm of upper lobe, left bronchus or lung: Secondary | ICD-10-CM | POA: Diagnosis not present

## 2016-07-22 DIAGNOSIS — Z510 Encounter for antineoplastic radiation therapy: Secondary | ICD-10-CM | POA: Diagnosis not present

## 2016-07-25 ENCOUNTER — Telehealth: Payer: Self-pay | Admitting: Oncology

## 2016-07-25 ENCOUNTER — Ambulatory Visit
Admission: RE | Admit: 2016-07-25 | Discharge: 2016-07-25 | Disposition: A | Discharge status: Home / Self Care | Payer: PPO | Source: Ambulatory Visit | Attending: Radiation Oncology | Admitting: Radiation Oncology

## 2016-07-25 ENCOUNTER — Ambulatory Visit (HOSPITAL_BASED_OUTPATIENT_CLINIC_OR_DEPARTMENT_OTHER): Payer: PPO | Admitting: Nurse Practitioner

## 2016-07-25 ENCOUNTER — Ambulatory Visit (HOSPITAL_BASED_OUTPATIENT_CLINIC_OR_DEPARTMENT_OTHER): Payer: PPO

## 2016-07-25 ENCOUNTER — Other Ambulatory Visit: Payer: PPO

## 2016-07-25 ENCOUNTER — Other Ambulatory Visit (HOSPITAL_BASED_OUTPATIENT_CLINIC_OR_DEPARTMENT_OTHER): Payer: PPO

## 2016-07-25 VITALS — BP 116/52 | HR 57 | Temp 97.7°F | Resp 16 | Ht 64.0 in | Wt 148.4 lb

## 2016-07-25 DIAGNOSIS — C3412 Malignant neoplasm of upper lobe, left bronchus or lung: Secondary | ICD-10-CM | POA: Diagnosis not present

## 2016-07-25 DIAGNOSIS — Z5111 Encounter for antineoplastic chemotherapy: Secondary | ICD-10-CM

## 2016-07-25 DIAGNOSIS — R1310 Dysphagia, unspecified: Secondary | ICD-10-CM

## 2016-07-25 DIAGNOSIS — C3492 Malignant neoplasm of unspecified part of left bronchus or lung: Secondary | ICD-10-CM | POA: Diagnosis not present

## 2016-07-25 DIAGNOSIS — K208 Other esophagitis: Secondary | ICD-10-CM | POA: Diagnosis not present

## 2016-07-25 DIAGNOSIS — Z510 Encounter for antineoplastic radiation therapy: Secondary | ICD-10-CM | POA: Diagnosis not present

## 2016-07-25 LAB — CBC WITH DIFFERENTIAL/PLATELET
BASO%: 1.3 % (ref 0.0–2.0)
BASOS ABS: 0 10*3/uL (ref 0.0–0.1)
EOS ABS: 0 10*3/uL (ref 0.0–0.5)
EOS%: 0.9 % (ref 0.0–7.0)
HCT: 32.4 % — ABNORMAL LOW (ref 34.8–46.6)
HGB: 10.5 g/dL — ABNORMAL LOW (ref 11.6–15.9)
LYMPH%: 17.2 % (ref 14.0–49.7)
MCH: 28 pg (ref 25.1–34.0)
MCHC: 32.4 g/dL (ref 31.5–36.0)
MCV: 86.5 fL (ref 79.5–101.0)
MONO#: 0.2 10*3/uL (ref 0.1–0.9)
MONO%: 10.3 % (ref 0.0–14.0)
NEUT%: 70.3 % (ref 38.4–76.8)
NEUTROS ABS: 1.6 10*3/uL (ref 1.5–6.5)
PLATELETS: 163 10*3/uL (ref 145–400)
RBC: 3.74 10*6/uL (ref 3.70–5.45)
RDW: 15.1 % — ABNORMAL HIGH (ref 11.2–14.5)
WBC: 2.2 10*3/uL — ABNORMAL LOW (ref 3.9–10.3)
lymph#: 0.4 10*3/uL — ABNORMAL LOW (ref 0.9–3.3)

## 2016-07-25 LAB — COMPREHENSIVE METABOLIC PANEL
ALK PHOS: 78 U/L (ref 40–150)
ALT: 20 U/L (ref 0–55)
ANION GAP: 10 meq/L (ref 3–11)
AST: 25 U/L (ref 5–34)
Albumin: 3.1 g/dL — ABNORMAL LOW (ref 3.5–5.0)
BILIRUBIN TOTAL: 0.58 mg/dL (ref 0.20–1.20)
BUN: 25.9 mg/dL (ref 7.0–26.0)
CO2: 25 meq/L (ref 22–29)
Calcium: 9.6 mg/dL (ref 8.4–10.4)
Chloride: 105 mEq/L (ref 98–109)
Creatinine: 1.5 mg/dL — ABNORMAL HIGH (ref 0.6–1.1)
EGFR: 35 mL/min/{1.73_m2} — AB (ref >90)
Glucose: 87 mg/dl (ref 70–140)
POTASSIUM: 4.4 meq/L (ref 3.5–5.1)
SODIUM: 140 meq/L (ref 136–145)
TOTAL PROTEIN: 6.8 g/dL (ref 6.4–8.3)

## 2016-07-25 MED ORDER — PALONOSETRON HCL INJECTION 0.25 MG/5ML
INTRAVENOUS | Status: AC
  Filled 2016-07-25: qty 5

## 2016-07-25 MED ORDER — FAMOTIDINE IN NACL 20-0.9 MG/50ML-% IV SOLN
INTRAVENOUS | Status: AC
  Filled 2016-07-25: qty 50

## 2016-07-25 MED ORDER — SUCRALFATE 1 GM/10ML PO SUSP: 1.0000 g | Freq: Three times a day (TID) | ORAL | 0 refills | Status: DC

## 2016-07-25 MED ORDER — SODIUM CHLORIDE 0.9 % IV SOLN
Freq: Once | INTRAVENOUS | Status: AC
  Administered 2016-07-25: 11:00:00 via INTRAVENOUS

## 2016-07-25 MED ORDER — SODIUM CHLORIDE 0.9 % IV SOLN
20.0000 mg | Freq: Once | INTRAVENOUS | Status: AC
  Administered 2016-07-25: 20 mg via INTRAVENOUS
  Filled 2016-07-25: qty 2

## 2016-07-25 MED ORDER — SODIUM CHLORIDE 0.9 % IV SOLN
45.0000 mg/m2 | Freq: Once | INTRAVENOUS | Status: AC
  Administered 2016-07-25: 78 mg via INTRAVENOUS
  Filled 2016-07-25: qty 13

## 2016-07-25 MED ORDER — DIPHENHYDRAMINE HCL 50 MG/ML IJ SOLN
INTRAMUSCULAR | Status: AC
  Filled 2016-07-25: qty 1

## 2016-07-25 MED ORDER — DIPHENHYDRAMINE HCL 50 MG/ML IJ SOLN
50.0000 mg | Freq: Once | INTRAMUSCULAR | Status: AC
Start: 2016-07-25 — End: 2016-07-25
  Administered 2016-07-25: 50 mg via INTRAVENOUS

## 2016-07-25 MED ORDER — PALONOSETRON HCL INJECTION 0.25 MG/5ML
0.2500 mg | Freq: Once | INTRAVENOUS | Status: AC
  Administered 2016-07-25: 0.25 mg via INTRAVENOUS

## 2016-07-25 MED ORDER — SODIUM CHLORIDE 0.9 % IV SOLN
125.6000 mg | Freq: Once | INTRAVENOUS | Status: AC
  Administered 2016-07-25: 130 mg via INTRAVENOUS
  Filled 2016-07-25: qty 13

## 2016-07-25 MED ORDER — FAMOTIDINE IN NACL 20-0.9 MG/50ML-% IV SOLN
20.0000 mg | Freq: Once | INTRAVENOUS | Status: AC
  Administered 2016-07-25: 20 mg via INTRAVENOUS

## 2016-07-25 NOTE — Progress Notes (Addendum)
  [facility] OFFICE PROGRESS NOTE   DIAGNOSIS: Stage IIIa (T2a, N1, M0) non-small cell lung cancer, squamous cell carcinoma presented with a left hilar mass diagnosed in August 2017.  PRIOR THERAPY: None  CURRENT THERAPY: Current chemoradiation with weekly carboplatin for an AUC of 2 and paclitaxel 45 mg meter squared started 07/05/2016.  INTERVAL HISTORY:   Renee Vance returns as scheduled. She continues radiation. She completed week 3 carboplatin/Taxol 07/18/2016. No nausea or vomiting following the chemotherapy. No mouth sores. She had recent constipation. She took a laxative and developed diarrhea. Bowel habits have since returned to normal. She notes some burning discomfort especially with swallowing water. She has stable dyspnea on exertion. Occasional cough. No fever. No numbness or tingling in her hands or feet. She has noted increased blurred vision. No diplopia.  Objective:  Vital signs in last 24 hours:  Blood pressure (!) 116/52, pulse (!) 57, temperature 97.7 F (36.5 C), temperature source Oral, resp. rate 16, height '5\' 4"'$  (1.626 m), weight 148 lb 6.4 oz (67.3 kg), SpO2 99 %.    HEENT: No thrush or ulcers. Resp: Lungs clear bilaterally. Cardio: Regular rate and rhythm. GI: Abdomen soft and nontender. No hepatomegaly. Vascular: No leg edema. Calves soft and nontender.   Lab Results:  Lab Results  Component Value Date   WBC 2.2 (L) 07/25/2016   HGB 10.5 (L) 07/25/2016   HCT 32.4 (L) 07/25/2016   MCV 86.5 07/25/2016   PLT 163 07/25/2016   NEUTROABS 1.6 07/25/2016    Imaging:  No results found.  Medications: I have reviewed the patient's current medications.  Assessment/Plan: 1. Stage IIIa (T2a, N1, M0) non-small cell lung cancer, squamous cell carcinoma presented with left hilar mass diagnosed in August 2017. 2. Radiation esophagitis.   Disposition: Renee Vance appears stable. She continues radiation and concurrent carboplatin/Taxol  chemotherapy. Plan to proceed with week 4 of the chemotherapy today as scheduled.  She is developing radiation esophagitis. We sent a prescription to her pharmacy for Carafate. She will begin an over-the-counter acid medication as well. She understands to contact the office if she has difficulty tolerating liquids.  She will return for a follow-up visit in 2 weeks. She will contact the office in the interim as outlined above or with any other problems.  Patient seen with Dr. Julien Nordmann.    Ned Card ANP/GNP-BC   07/25/2016  9:57 AM  ADDENDUM: Hematology/Oncology Attending: I had a face to face encounter with the patient today. I recommended her care plan. This is a very pleasant 75 years old white female recently diagnosed with a stage IIIa non-small cell lung cancer, squamous cell carcinoma. She is currently undergoing a course of concurrent chemoradiation with weekly carboplatin and paclitaxel is status post 3 cycles. She is tolerating her treatment well with no significant adverse effects except for mild odynophagia secondary to radiation induced esophagitis. I recommended for the patient to continue her treatment as a scheduled. We will start her on Carafate 1 g by mouth 4 times a day. She will come back for follow-up visit in 2 weeks for evaluation and management of any adverse effect of her treatment. She was advised to call immediately if she has any concerning symptoms in the interval.  Disclaimer: This note was dictated with voice recognition software. Similar sounding words can inadvertently be transcribed and may be missed upon review. [name]., MD 07/25/16

## 2016-07-25 NOTE — Patient Instructions (Signed)
[facility] Discharge Instructions for Patients Receiving Chemotherapy  Today you received the following chemotherapy agents TAxol and Carboplatin. To help prevent nausea and vomiting after your treatment, we encourage you to take your nausea medication as directed. NO ZOFRAN for 3 days.   If you develop nausea and vomiting that is not controlled by your nausea medication, call the clinic.   BELOW ARE SYMPTOMS THAT SHOULD BE REPORTED IMMEDIATELY:  *FEVER GREATER THAN 100.5 F  *CHILLS WITH OR WITHOUT FEVER  NAUSEA AND VOMITING THAT IS NOT CONTROLLED WITH YOUR NAUSEA MEDICATION  *UNUSUAL SHORTNESS OF BREATH  *UNUSUAL BRUISING OR BLEEDING  TENDERNESS IN MOUTH AND THROAT WITH OR WITHOUT PRESENCE OF ULCERS  *URINARY PROBLEMS  *BOWEL PROBLEMS  UNUSUAL RASH Items with * indicate a potential emergency and should be followed up as soon as possible.  Feel free to call the clinic you have any questions or concerns. The clinic phone number is [phone].  Please show the [place] at check-in to the Emergency Department and triage nurse.

## 2016-07-25 NOTE — Telephone Encounter (Signed)
Kanchan's husband called and said their car is broken down and will not be ready until late tomorrow afternoon.  He wants to cancel Debbe's radiation treatment tomorrow.  Advised him that the machine will be notified and Dr. Sondra Come.

## 2016-07-26 ENCOUNTER — Ambulatory Visit: Payer: PPO | Admitting: Radiation Oncology

## 2016-07-26 ENCOUNTER — Ambulatory Visit: Payer: PPO

## 2016-07-27 ENCOUNTER — Ambulatory Visit
Admission: RE | Admit: 2016-07-27 | Discharge: 2016-07-27 | Disposition: A | Discharge status: Home / Self Care | Payer: PPO | Source: Ambulatory Visit | Attending: Radiation Oncology | Admitting: Radiation Oncology

## 2016-07-27 DIAGNOSIS — C3412 Malignant neoplasm of upper lobe, left bronchus or lung: Secondary | ICD-10-CM | POA: Diagnosis not present

## 2016-07-27 DIAGNOSIS — Z510 Encounter for antineoplastic radiation therapy: Secondary | ICD-10-CM | POA: Diagnosis not present

## 2016-07-28 ENCOUNTER — Ambulatory Visit
Admission: RE | Admit: 2016-07-28 | Discharge: 2016-07-28 | Disposition: A | Discharge status: Home / Self Care | Payer: PPO | Source: Ambulatory Visit | Attending: Radiation Oncology | Admitting: Radiation Oncology

## 2016-07-28 ENCOUNTER — Ambulatory Visit (INDEPENDENT_AMBULATORY_CARE_PROVIDER_SITE_OTHER): Payer: PPO | Admitting: *Deleted

## 2016-07-28 DIAGNOSIS — C3412 Malignant neoplasm of upper lobe, left bronchus or lung: Secondary | ICD-10-CM | POA: Diagnosis not present

## 2016-07-28 DIAGNOSIS — I480 Paroxysmal atrial fibrillation: Secondary | ICD-10-CM | POA: Diagnosis not present

## 2016-07-28 DIAGNOSIS — Z5181 Encounter for therapeutic drug level monitoring: Secondary | ICD-10-CM

## 2016-07-28 DIAGNOSIS — Z510 Encounter for antineoplastic radiation therapy: Secondary | ICD-10-CM | POA: Diagnosis not present

## 2016-07-28 LAB — POCT INR: INR: 3.3

## 2016-07-28 MED ORDER — WARFARIN SODIUM 5 MG PO TABS: ORAL_TABLET | ORAL | 3 refills | Status: DC

## 2016-07-29 ENCOUNTER — Ambulatory Visit
Admission: RE | Admit: 2016-07-29 | Discharge: 2016-07-29 | Disposition: A | Discharge status: Home / Self Care | Payer: PPO | Source: Ambulatory Visit | Attending: Radiation Oncology | Admitting: Radiation Oncology

## 2016-07-29 DIAGNOSIS — Z510 Encounter for antineoplastic radiation therapy: Secondary | ICD-10-CM | POA: Diagnosis not present

## 2016-07-29 DIAGNOSIS — C3412 Malignant neoplasm of upper lobe, left bronchus or lung: Secondary | ICD-10-CM | POA: Diagnosis not present

## 2016-08-01 ENCOUNTER — Other Ambulatory Visit (HOSPITAL_BASED_OUTPATIENT_CLINIC_OR_DEPARTMENT_OTHER): Payer: PPO

## 2016-08-01 ENCOUNTER — Ambulatory Visit
Admission: RE | Admit: 2016-08-01 | Discharge: 2016-08-01 | Disposition: A | Discharge status: Home / Self Care | Payer: PPO | Source: Ambulatory Visit | Attending: Radiation Oncology | Admitting: Radiation Oncology

## 2016-08-01 ENCOUNTER — Ambulatory Visit (HOSPITAL_BASED_OUTPATIENT_CLINIC_OR_DEPARTMENT_OTHER): Payer: PPO

## 2016-08-01 VITALS — BP 147/64 | HR 55 | Temp 98.0°F | Resp 18

## 2016-08-01 DIAGNOSIS — C3492 Malignant neoplasm of unspecified part of left bronchus or lung: Secondary | ICD-10-CM

## 2016-08-01 DIAGNOSIS — C3412 Malignant neoplasm of upper lobe, left bronchus or lung: Secondary | ICD-10-CM

## 2016-08-01 DIAGNOSIS — Z510 Encounter for antineoplastic radiation therapy: Secondary | ICD-10-CM | POA: Diagnosis not present

## 2016-08-01 DIAGNOSIS — Z5111 Encounter for antineoplastic chemotherapy: Secondary | ICD-10-CM | POA: Diagnosis not present

## 2016-08-01 LAB — CBC WITH DIFFERENTIAL/PLATELET
BASO%: 0.5 % (ref 0.0–2.0)
Basophils Absolute: 0 10*3/uL (ref 0.0–0.1)
EOS%: 0.4 % (ref 0.0–7.0)
Eosinophils Absolute: 0 10*3/uL (ref 0.0–0.5)
HCT: 31.9 % — ABNORMAL LOW (ref 34.8–46.6)
HEMOGLOBIN: 10.3 g/dL — AB (ref 11.6–15.9)
LYMPH%: 12 % — ABNORMAL LOW (ref 14.0–49.7)
MCH: 28.1 pg (ref 25.1–34.0)
MCHC: 32.2 g/dL (ref 31.5–36.0)
MCV: 87.2 fL (ref 79.5–101.0)
MONO#: 0.2 10*3/uL (ref 0.1–0.9)
MONO%: 7.9 % (ref 0.0–14.0)
NEUT%: 79.2 % — ABNORMAL HIGH (ref 38.4–76.8)
NEUTROS ABS: 1.9 10*3/uL (ref 1.5–6.5)
Platelets: 115 10*3/uL — ABNORMAL LOW (ref 145–400)
RBC: 3.66 10*6/uL — ABNORMAL LOW (ref 3.70–5.45)
RDW: 14.8 % — AB (ref 11.2–14.5)
WBC: 2.4 10*3/uL — AB (ref 3.9–10.3)
lymph#: 0.3 10*3/uL — ABNORMAL LOW (ref 0.9–3.3)

## 2016-08-01 LAB — COMPREHENSIVE METABOLIC PANEL
ALBUMIN: 3 g/dL — AB (ref 3.5–5.0)
ALK PHOS: 74 U/L (ref 40–150)
ALT: 20 U/L (ref 0–55)
AST: 24 U/L (ref 5–34)
Anion Gap: 10 mEq/L (ref 3–11)
BILIRUBIN TOTAL: 0.46 mg/dL (ref 0.20–1.20)
BUN: 26.7 mg/dL — AB (ref 7.0–26.0)
CO2: 23 mEq/L (ref 22–29)
Calcium: 9.3 mg/dL (ref 8.4–10.4)
Chloride: 108 mEq/L (ref 98–109)
Creatinine: 1.2 mg/dL — ABNORMAL HIGH (ref 0.6–1.1)
EGFR: 45 mL/min/{1.73_m2} — ABNORMAL LOW (ref >90)
GLUCOSE: 82 mg/dL (ref 70–140)
Potassium: 3.8 mEq/L (ref 3.5–5.1)
SODIUM: 141 meq/L (ref 136–145)
TOTAL PROTEIN: 6.7 g/dL (ref 6.4–8.3)

## 2016-08-01 MED ORDER — PALONOSETRON HCL INJECTION 0.25 MG/5ML
0.2500 mg | Freq: Once | INTRAVENOUS | Status: AC
  Administered 2016-08-01: 0.25 mg via INTRAVENOUS

## 2016-08-01 MED ORDER — PALONOSETRON HCL INJECTION 0.25 MG/5ML
INTRAVENOUS | Status: AC
  Filled 2016-08-01: qty 5

## 2016-08-01 MED ORDER — FAMOTIDINE IN NACL 20-0.9 MG/50ML-% IV SOLN
20.0000 mg | Freq: Once | INTRAVENOUS | Status: AC
  Administered 2016-08-01: 20 mg via INTRAVENOUS

## 2016-08-01 MED ORDER — SODIUM CHLORIDE 0.9 % IV SOLN
125.6000 mg | Freq: Once | INTRAVENOUS | Status: AC
  Administered 2016-08-01: 130 mg via INTRAVENOUS
  Filled 2016-08-01: qty 13

## 2016-08-01 MED ORDER — DIPHENHYDRAMINE HCL 50 MG/ML IJ SOLN
INTRAMUSCULAR | Status: AC
  Filled 2016-08-01: qty 1

## 2016-08-01 MED ORDER — SODIUM CHLORIDE 0.9 % IV SOLN
Freq: Once | INTRAVENOUS | Status: AC
  Administered 2016-08-01: 12:00:00 via INTRAVENOUS

## 2016-08-01 MED ORDER — SODIUM CHLORIDE 0.9 % IV SOLN
45.0000 mg/m2 | Freq: Once | INTRAVENOUS | Status: AC
  Administered 2016-08-01: 78 mg via INTRAVENOUS
  Filled 2016-08-01: qty 13

## 2016-08-01 MED ORDER — SODIUM CHLORIDE 0.9 % IV SOLN
20.0000 mg | Freq: Once | INTRAVENOUS | Status: AC
  Administered 2016-08-01: 20 mg via INTRAVENOUS
  Filled 2016-08-01: qty 2

## 2016-08-01 MED ORDER — FAMOTIDINE IN NACL 20-0.9 MG/50ML-% IV SOLN
INTRAVENOUS | Status: AC
  Filled 2016-08-01: qty 50

## 2016-08-01 MED ORDER — DIPHENHYDRAMINE HCL 50 MG/ML IJ SOLN
50.0000 mg | Freq: Once | INTRAMUSCULAR | Status: AC
  Administered 2016-08-01: 50 mg via INTRAVENOUS

## 2016-08-01 NOTE — Patient Instructions (Signed)
[facility] Discharge Instructions for Patients Receiving Chemotherapy  Today you received the following chemotherapy agents Taxol and Carboplatin. To help prevent nausea and vomiting after your treatment, we encourage you to take your nausea medication as directed.  If you develop nausea and vomiting that is not controlled by your nausea medication, call the clinic.   BELOW ARE SYMPTOMS THAT SHOULD BE REPORTED IMMEDIATELY:  *FEVER GREATER THAN 100.5 F  *CHILLS WITH OR WITHOUT FEVER  NAUSEA AND VOMITING THAT IS NOT CONTROLLED WITH YOUR NAUSEA MEDICATION  *UNUSUAL SHORTNESS OF BREATH  *UNUSUAL BRUISING OR BLEEDING  TENDERNESS IN MOUTH AND THROAT WITH OR WITHOUT PRESENCE OF ULCERS  *URINARY PROBLEMS  *BOWEL PROBLEMS  UNUSUAL RASH Items with * indicate a potential emergency and should be followed up as soon as possible.  Feel free to call the clinic you have any questions or concerns. The clinic phone number is [phone].  Please show the CHEMO ALERT CARD at check-in to the Emergency Department and triage nurse.    

## 2016-08-02 ENCOUNTER — Ambulatory Visit
Admission: RE | Admit: 2016-08-02 | Discharge: 2016-08-02 | Disposition: A | Discharge status: Home / Self Care | Payer: PPO | Source: Ambulatory Visit | Attending: Radiation Oncology | Admitting: Radiation Oncology

## 2016-08-02 ENCOUNTER — Encounter: Payer: Self-pay | Admitting: Radiation Oncology

## 2016-08-02 VITALS — BP 137/58 | HR 57 | Temp 98.1°F | Ht 64.0 in | Wt 153.4 lb

## 2016-08-02 DIAGNOSIS — C3492 Malignant neoplasm of unspecified part of left bronchus or lung: Secondary | ICD-10-CM

## 2016-08-02 DIAGNOSIS — Z510 Encounter for antineoplastic radiation therapy: Secondary | ICD-10-CM | POA: Diagnosis not present

## 2016-08-02 DIAGNOSIS — C3412 Malignant neoplasm of upper lobe, left bronchus or lung: Secondary | ICD-10-CM | POA: Diagnosis not present

## 2016-08-02 NOTE — Progress Notes (Signed)
  Radiation Oncology         [phone] ________________________________  Name: Renee Vance MRN: [id number]  Date: 08/02/2016  DOB: April 25, 1941  Weekly Radiation Therapy Management    ICD-9-CM ICD-10-CM   1. 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 . . . . . . . The patient presents for routine under treatment assessment.                                   The patient is without complaint. Renee Vance has completed 20 fractions to her left lung.  She denies having pain but does report having burning in her chest.  She has been taking carafate 4 times a day which has been helping.  She denies having an increase in shortness of breath.  She reports having an occasional cough.  She is taking delsym twice a day.  She reports having fatigue in the evenings.  She had chemotherapy yesterday.  The skin on her left chest and back is intact. The patient reports that she is able to swallow easily.                                    Set-up films were reviewed.                                 The chart was checked. Physical Findings. .  Wt Readings from Last 3 Encounters:  07/19/16 151 lb 3.2 oz (68.6 kg)  07/12/16 152 lb (68.9 kg)  07/11/16 151 lb 11.2 oz (68.8 kg)   BP (!) 137/58 (BP Location: Right Arm, Patient Position: Sitting)   Pulse (!) 57   Temp 98.1 F (36.7 C) (Oral)   Ht '5\' 4"'$  (1.626 m)   Wt 153 lb 6.4 oz (69.6 kg)   SpO2 100%   BMI 26.33 kg/m     Weight essentially stable.  No significant changes. Lungs clear. Heart regular rhythm and rate Impression . . . . . . . The patient is tolerating radiation. Plan . . . . . . . . . . . . Continue treatment as planned in a pre-op fashion. ________________________________   Blair Promise, PhD, MD  This document serves as a record of services personally performed by Gery Pray, MD. It was created on his behalf by Truddie Hidden, a trained medical scribe. The creation of this record is based on the scribe's personal observations and the provider's statements to them. This document has been checked and approved by the attending provider.

## 2016-08-02 NOTE — Progress Notes (Addendum)
Renee Vance has completed 20 fractions to her left lung.  She denies having pain but does report having burning in her chest.  She has been taking carafate 4 times a day which has been helping.  She denies having an increase in shortness of breath.  She reports having an occasional cough.  She is taking delsym twice a day.  She reports having fatigue in the evenings.  She had chemotherapy yesterday.  The skin on her left chest and back is intact.  BP (!) 137/58 (BP Location: Right Arm, Patient Position: Sitting)   Pulse (!) 57   Temp 98.1 F (36.7 C) (Oral)   Ht '5\' 4"'$  (1.626 m)   Wt 153 lb 6.4 oz (69.6 kg)   SpO2 100%   BMI 26.33 kg/m    Wt Readings from Last 3 Encounters:  08/02/16 153 lb 6.4 oz (69.6 kg)  07/25/16 148 lb 6.4 oz (67.3 kg)  07/19/16 151 lb 3.2 oz (68.6 kg)

## 2016-08-03 ENCOUNTER — Ambulatory Visit
Admission: RE | Admit: 2016-08-03 | Discharge: 2016-08-03 | Disposition: A | Discharge status: Home / Self Care | Payer: PPO | Source: Ambulatory Visit | Attending: Radiation Oncology | Admitting: Radiation Oncology

## 2016-08-03 DIAGNOSIS — C3412 Malignant neoplasm of upper lobe, left bronchus or lung: Secondary | ICD-10-CM | POA: Diagnosis not present

## 2016-08-03 DIAGNOSIS — Z510 Encounter for antineoplastic radiation therapy: Secondary | ICD-10-CM | POA: Diagnosis not present

## 2016-08-04 ENCOUNTER — Ambulatory Visit
Admission: RE | Admit: 2016-08-04 | Discharge: 2016-08-04 | Disposition: A | Discharge status: Home / Self Care | Payer: PPO | Source: Ambulatory Visit | Attending: Radiation Oncology | Admitting: Radiation Oncology

## 2016-08-04 DIAGNOSIS — Z510 Encounter for antineoplastic radiation therapy: Secondary | ICD-10-CM | POA: Diagnosis not present

## 2016-08-04 DIAGNOSIS — C3412 Malignant neoplasm of upper lobe, left bronchus or lung: Secondary | ICD-10-CM | POA: Diagnosis not present

## 2016-08-05 ENCOUNTER — Ambulatory Visit
Admission: RE | Admit: 2016-08-05 | Discharge: 2016-08-05 | Disposition: A | Discharge status: Home / Self Care | Payer: PPO | Source: Ambulatory Visit | Attending: Radiation Oncology | Admitting: Radiation Oncology

## 2016-08-05 DIAGNOSIS — C3412 Malignant neoplasm of upper lobe, left bronchus or lung: Secondary | ICD-10-CM | POA: Diagnosis not present

## 2016-08-05 DIAGNOSIS — Z510 Encounter for antineoplastic radiation therapy: Secondary | ICD-10-CM | POA: Diagnosis not present

## 2016-08-08 ENCOUNTER — Ambulatory Visit: Payer: PPO

## 2016-08-08 ENCOUNTER — Ambulatory Visit
Admission: RE | Admit: 2016-08-08 | Discharge: 2016-08-08 | Disposition: A | Discharge status: Home / Self Care | Payer: PPO | Source: Ambulatory Visit | Attending: Radiation Oncology | Admitting: Radiation Oncology

## 2016-08-08 ENCOUNTER — Ambulatory Visit (HOSPITAL_BASED_OUTPATIENT_CLINIC_OR_DEPARTMENT_OTHER): Payer: PPO | Admitting: Internal Medicine

## 2016-08-08 ENCOUNTER — Telehealth: Payer: Self-pay | Admitting: Internal Medicine

## 2016-08-08 ENCOUNTER — Encounter: Payer: Self-pay | Admitting: Internal Medicine

## 2016-08-08 ENCOUNTER — Other Ambulatory Visit (HOSPITAL_BASED_OUTPATIENT_CLINIC_OR_DEPARTMENT_OTHER): Payer: PPO

## 2016-08-08 ENCOUNTER — Other Ambulatory Visit: Payer: Self-pay | Admitting: Internal Medicine

## 2016-08-08 VITALS — Ht 64.0 in | Wt 150.7 lb

## 2016-08-08 DIAGNOSIS — Z5111 Encounter for antineoplastic chemotherapy: Secondary | ICD-10-CM

## 2016-08-08 DIAGNOSIS — C3492 Malignant neoplasm of unspecified part of left bronchus or lung: Secondary | ICD-10-CM

## 2016-08-08 DIAGNOSIS — D72818 Other decreased white blood cell count: Secondary | ICD-10-CM

## 2016-08-08 DIAGNOSIS — R1310 Dysphagia, unspecified: Secondary | ICD-10-CM | POA: Diagnosis not present

## 2016-08-08 DIAGNOSIS — Z510 Encounter for antineoplastic radiation therapy: Secondary | ICD-10-CM | POA: Diagnosis not present

## 2016-08-08 DIAGNOSIS — C3412 Malignant neoplasm of upper lobe, left bronchus or lung: Secondary | ICD-10-CM | POA: Diagnosis not present

## 2016-08-08 LAB — CBC WITH DIFFERENTIAL/PLATELET
BASO%: 0.8 % (ref 0.0–2.0)
BASOS ABS: 0 10*3/uL (ref 0.0–0.1)
EOS ABS: 0 10*3/uL (ref 0.0–0.5)
EOS%: 0.8 % (ref 0.0–7.0)
HEMATOCRIT: 30 % — AB (ref 34.8–46.6)
HEMOGLOBIN: 10 g/dL — AB (ref 11.6–15.9)
LYMPH%: 15.4 % (ref 14.0–49.7)
MCH: 28.6 pg (ref 25.1–34.0)
MCHC: 33.3 g/dL (ref 31.5–36.0)
MCV: 85.7 fL (ref 79.5–101.0)
MONO#: 0.1 10*3/uL (ref 0.1–0.9)
MONO%: 8.5 % (ref 0.0–14.0)
NEUT#: 1 10*3/uL — ABNORMAL LOW (ref 1.5–6.5)
NEUT%: 74.5 % (ref 38.4–76.8)
Platelets: 104 10*3/uL — ABNORMAL LOW (ref 145–400)
RBC: 3.5 10*6/uL — ABNORMAL LOW (ref 3.70–5.45)
RDW: 15 % — AB (ref 11.2–14.5)
WBC: 1.3 10*3/uL — ABNORMAL LOW (ref 3.9–10.3)
lymph#: 0.2 10*3/uL — ABNORMAL LOW (ref 0.9–3.3)

## 2016-08-08 LAB — COMPREHENSIVE METABOLIC PANEL
ALBUMIN: 3.1 g/dL — AB (ref 3.5–5.0)
ALT: 22 U/L (ref 0–55)
AST: 24 U/L (ref 5–34)
Alkaline Phosphatase: 72 U/L (ref 40–150)
Anion Gap: 9 mEq/L (ref 3–11)
BUN: 27.9 mg/dL — ABNORMAL HIGH (ref 7.0–26.0)
CALCIUM: 9.3 mg/dL (ref 8.4–10.4)
CHLORIDE: 107 meq/L (ref 98–109)
CO2: 24 mEq/L (ref 22–29)
Creatinine: 1.2 mg/dL — ABNORMAL HIGH (ref 0.6–1.1)
EGFR: 44 mL/min/{1.73_m2} — AB (ref >90)
Glucose: 85 mg/dl (ref 70–140)
POTASSIUM: 3.9 meq/L (ref 3.5–5.1)
SODIUM: 141 meq/L (ref 136–145)
Total Bilirubin: 0.74 mg/dL (ref 0.20–1.20)
Total Protein: 6.7 g/dL (ref 6.4–8.3)

## 2016-08-08 NOTE — Progress Notes (Signed)
Per Dr. Julien Nordmann, patient not to receive treatment today with ANC of 1.0 and WBC of 1.3. Patient made aware and verbalized understanding.

## 2016-08-08 NOTE — Progress Notes (Signed)
[facility] [phone]   [phone]  OFFICE PROGRESS NOTE  [name], PA-C [place] [address]  DIAGNOSIS: Stage IIA (T2a, N1, M0) non-small cell lung cancer, squamous cell carcinoma presented with a left hilar mass diagnosed in August 2017.  PRIOR THERAPY: None  CURRENT THERAPY: Current chemoradiation with weekly carboplatin for an AUC of 2 and paclitaxel 45 mg/mm2 started 07/05/2016. Status post 5 cycles.  INTERVAL HISTORY: Renee Vance 75 y.o. female returns to the clinic today for follow-up visit accompanied by her husband. The patient tolerated her course of concurrent chemoradiation fairly well with no significant complaints. She is expected to complete radiotherapy tomorrow. She has mild odynophagia. She lost around 9 pounds during this course of treatment. She denied having any significant chest pain, shortness of breath but has cough with no hemoptysis. She has no nausea or vomiting, no fever or chills. She was supposed to start cycle #6 today.  MEDICAL HISTORY: Past Medical History:  Diagnosis Date  . Bradycardia   . CAD (coronary artery disease)   . COPD (chronic obstructive pulmonary disease) ([place])   . Coronary atherosclerosis of native coronary artery 2011  . Dyspnea   . Dyspnea   . Encounter for antineoplastic chemotherapy 06/23/2016  . Fibrocystic breast   . HTN (hypertension)   . HTN (hypertension)   . Hyperlipidemia   . Hypothyroidism   . Obesity   . Pain in joint   . Stage II squamous cell carcinoma of left lung ([place]) 06/23/2016    ALLERGIES:  is allergic to ampicillin and codeine.  MEDICATIONS:  Current Outpatient Prescriptions  Medication Sig Dispense Refill  . amLODipine (NORVASC) 5 MG tablet Take 5 mg by mouth daily.    Marland Kitchen aspirin 81 MG tablet Take 81 mg by mouth daily.    Marland Kitchen atorvastatin (LIPITOR) 80 MG tablet Take 80 mg by mouth daily.    Marland Kitchen Dextromethorphan-Guaifenesin (DELSYM  COUGH/CHEST CONGEST DM PO) Take by mouth.    . levothyroxine (SYNTHROID, LEVOTHROID) 125 MCG tablet Take 125 mcg by mouth daily before breakfast.     . losartan (COZAAR) 100 MG tablet Take 100 mg by mouth daily.    . metoprolol succinate (TOPROL-XL) 50 MG 24 hr tablet Take 1 tablet (50 mg total) by mouth 2 (two) times daily. 180 tablet 1  . prochlorperazine (COMPAZINE) 10 MG tablet Take 1 tablet (10 mg total) by mouth every 6 (six) hours as needed for nausea or vomiting. 30 tablet 0  . sucralfate (CARAFATE) 1 GM/10ML suspension Take 10 mLs (1 g total) by mouth 4 (four) times daily -  with meals and at bedtime. 420 mL 0  . tiotropium (SPIRIVA) 18 MCG inhalation capsule Place 18 mcg into inhaler and inhale daily.    . Vitamin D, Cholecalciferol, 1000 UNITS CAPS Take 1,000 Units by mouth daily.     Marland Kitchen warfarin (COUMADIN) 5 MG tablet Take 1/2 tablet daily except 1 tablet on Sundays and Thursdays 90 tablet 3   No current facility-administered medications for this visit.     SURGICAL HISTORY:  Past Surgical History:  Procedure Laterality Date  . CHOLECYSTECTOMY    . CORONARY ANGIOPLASTY WITH STENT PLACEMENT  2011   Lmain 30-40%, LAD 65-75% (FFR 0.88), CFX 55-60%, RCA 95%>0 w/ 2.5 x 12 mm monorail stent  . TUBAL LIGATION    . VIDEO BRONCHOSCOPY Bilateral 06/07/2016   Procedure: VIDEO BRONCHOSCOPY WITH FLUORO;  Surgeon: Leanna Sato  Elsworth Soho, MD;  Location: [facility];  Service: Cardiopulmonary;  Laterality: Bilateral;    REVIEW OF SYSTEMS:  Constitutional: negative Eyes: negative Ears, nose, mouth, throat, and face: negative Respiratory: positive for cough Cardiovascular: negative Gastrointestinal: positive for odynophagia Genitourinary:negative Integument/breast: negative Hematologic/lymphatic: negative Musculoskeletal:negative Neurological: negative Behavioral/Psych: negative Endocrine: negative Allergic/Immunologic: negative   PHYSICAL EXAMINATION: General appearance: alert, cooperative,  fatigued and no distress Head: Normocephalic, without obvious abnormality, atraumatic Neck: no adenopathy, no JVD, supple, symmetrical, trachea midline and thyroid not enlarged, symmetric, no tenderness/mass/nodules Lymph nodes: Cervical, supraclavicular, and axillary nodes normal. Resp: clear to auscultation bilaterally Back: symmetric, no curvature. ROM normal. No CVA tenderness. Cardio: regular rate and rhythm, S1, S2 normal, no murmur, click, rub or gallop GI: soft, non-tender; bowel sounds normal; no masses,  no organomegaly Extremities: extremities normal, atraumatic, no cyanosis or edema Neurologic: Alert and oriented X 3, normal strength and tone. Normal symmetric reflexes. Normal coordination and gait  ECOG PERFORMANCE STATUS: 1 - Symptomatic but completely ambulatory  Height '5\' 4"'$  (1.626 m), weight 150 lb 11.2 oz (68.4 kg).  LABORATORY DATA: Lab Results  Component Value Date   WBC 1.3 (L) 08/08/2016   HGB 10.0 (L) 08/08/2016   HCT 30.0 (L) 08/08/2016   MCV 85.7 08/08/2016   PLT 104 (L) 08/08/2016      Chemistry      Component Value Date/Time   NA 141 08/08/2016 0929   K 3.9 08/08/2016 0929   CL 105 05/29/2016 0426   CO2 24 08/08/2016 0929   BUN 27.9 (H) 08/08/2016 0929   CREATININE 1.2 (H) 08/08/2016 0929      Component Value Date/Time   CALCIUM 9.3 08/08/2016 0929   ALKPHOS 72 08/08/2016 0929   AST 24 08/08/2016 0929   ALT 22 08/08/2016 0929   BILITOT 0.74 08/08/2016 0929       RADIOGRAPHIC STUDIES: No results found.  ASSESSMENT AND PLAN: This is a very pleasant 75 years old white female with unresectable a stage II a non-small cell lung cancer. She underwent a course of concurrent chemoradiation with weekly carboplatin and paclitaxel is status post 5 cycles. She was supposed to start cycle #6 today but her absolute neutrophil count is low. I recommended for her to discontinue her current systemic chemotherapy at this point since she has only 1 fraction of  radiotherapy tomorrow. I will arrange for the patient to come back for follow-up visit in 6 weeks for reevaluation with repeat CT scan of the chest for restaging of her disease. For the odynophagia, she will continue on Carafate and pain medication. The patient was advised to call immediately if she has any concerning symptoms in the interval. The patient voices understanding of current disease status and treatment options and is in agreement with the current care plan.  All questions were answered. The patient knows to call the clinic with any problems, questions or concerns. We can certainly see the patient much sooner if necessary.  I spent 15 minutes counseling the patient face to face. The total time spent in the appointment was 25 minutes.  Disclaimer: This note was dictated with voice recognition software. Similar sounding words can inadvertently be transcribed and may not be corrected upon review.

## 2016-08-08 NOTE — Telephone Encounter (Signed)
AVS report and appointment schedule was given to patient per 08/08/16 los.

## 2016-08-08 NOTE — Patient Instructions (Signed)
Treatment canceled due to low ANC

## 2016-08-09 ENCOUNTER — Ambulatory Visit
Admission: RE | Admit: 2016-08-09 | Discharge: 2016-08-09 | Disposition: A | Discharge status: Home / Self Care | Payer: PPO | Source: Ambulatory Visit | Attending: Radiation Oncology | Admitting: Radiation Oncology

## 2016-08-09 ENCOUNTER — Encounter: Payer: Self-pay | Admitting: Radiation Oncology

## 2016-08-09 VITALS — BP 154/88 | HR 60 | Temp 98.4°F | Ht 64.0 in | Wt 153.2 lb

## 2016-08-09 DIAGNOSIS — Z510 Encounter for antineoplastic radiation therapy: Secondary | ICD-10-CM | POA: Diagnosis not present

## 2016-08-09 DIAGNOSIS — C3412 Malignant neoplasm of upper lobe, left bronchus or lung: Secondary | ICD-10-CM | POA: Diagnosis not present

## 2016-08-09 DIAGNOSIS — C3492 Malignant neoplasm of unspecified part of left bronchus or lung: Secondary | ICD-10-CM

## 2016-08-09 NOTE — Progress Notes (Signed)
  Radiation Oncology         [phone] ________________________________  Name: Renee Vance MRN: [id number]  Date: 08/09/2016  DOB: [birth year]  Weekly Radiation Therapy Management    ICD-9-CM ICD-10-CM   1. Stage II squamous cell carcinoma of left lung (HCC) 162.9 C34.92       Current Dose: 45 Gy     Planned Dose:  45 Gy  Narrative . . . . . . . . The patient presents for routine under treatment assessment.                                   The patient is without complaint. Renee Vance has completed 25 fractions to her left lung.  She continues to have intermittent burning in the chest. She said it does not occur when eating. She is taking carafate 2-3 times per day, but she states it is not working. She reports more SOB yesterday. This has since resolved. She reports a dry cough. She was not able to have chemotherapy yesterday due to low blood counts. She reports fatigue in the evenings.                                   Set-up films were reviewed.                                 The chart was checked. Physical Findings. .  Wt Readings from Last 3 Encounters:  07/19/16 151 lb 3.2 oz (68.6 kg)  07/12/16 152 lb (68.9 kg)  07/11/16 151 lb 11.2 oz (68.8 kg)   BP (!) 137/58 (BP Location: Right Arm, Patient Position: Sitting)   Pulse (!) 57   Temp 98.1 F (36.7 C) (Oral)   Ht '5\' 4"'$  (1.626 m)   Wt 153 lb 6.4 oz (69.6 kg)   SpO2 100%   BMI 26.33 kg/m     Weight essentially stable.  No significant changes. Lungs clear. Heart regular rhythm and rate. The skin on her left chest and back are intact.  Impression . . . . . . . The patient has tolerated radiation. After discussing with the patient appears her symptoms are more acid reflux related and have recommended she continue Carafate and to initiate over-the-counter Prilosec Plan . . . . . . . . . . . . Patient has been given a one month follow up appointment. She will have scans at that time to determine if she is not  operative candidate. ________________________________   Blair Promise, PhD, MD  This document serves as a record of services personally performed by Gery Pray, MD. It was created on his behalf by Bethann Humble, a trained medical scribe. The creation of this record is based on the scribe's personal observations and the provider's statements to them. This document has been checked and approved by the attending provider.

## 2016-08-09 NOTE — Progress Notes (Addendum)
Renee Vance has completed treatment with 25 fractions to her left lung.  She continues to have burning in her chest that happens any time.  She said it does not happen when she is eating.  She is taking carafate 2-3 times per day but says it is not working.  She reports having more shortness of breath yesterday which has gone away today.  She reports having a dry cough.  She was not able to have chemotherapy yesterday due to her blood counts.  She reports having fatigue in the evenings.  She has been given a one month follow up appointment.  The skin on her left chest and back is intact.  BP (!) 154/88 (BP Location: Left Arm, Patient Position: Sitting)   Pulse 60   Temp 98.4 F (36.9 C) (Oral)   Ht '5\' 4"'$  (1.626 m)   Wt 153 lb 3.2 oz (69.5 kg)   SpO2 99%   BMI 26.30 kg/m   Wt Readings from Last 3 Encounters:  08/08/16 150 lb 11.2 oz (68.4 kg)  08/02/16 153 lb 6.4 oz (69.6 kg)  07/25/16 148 lb 6.4 oz (67.3 kg)

## 2016-08-10 ENCOUNTER — Encounter: Payer: Self-pay | Admitting: Radiation Oncology

## 2016-08-10 DIAGNOSIS — H04123 Dry eye syndrome of bilateral lacrimal glands: Secondary | ICD-10-CM | POA: Diagnosis not present

## 2016-08-10 DIAGNOSIS — H40033 Anatomical narrow angle, bilateral: Secondary | ICD-10-CM | POA: Diagnosis not present

## 2016-08-11 ENCOUNTER — Ambulatory Visit (INDEPENDENT_AMBULATORY_CARE_PROVIDER_SITE_OTHER): Payer: PPO | Admitting: *Deleted

## 2016-08-11 DIAGNOSIS — I480 Paroxysmal atrial fibrillation: Secondary | ICD-10-CM | POA: Diagnosis not present

## 2016-08-11 DIAGNOSIS — Z5181 Encounter for therapeutic drug level monitoring: Secondary | ICD-10-CM

## 2016-08-11 LAB — POCT INR: INR: 1.6

## 2016-08-15 ENCOUNTER — Other Ambulatory Visit: Payer: PPO

## 2016-08-15 ENCOUNTER — Ambulatory Visit: Payer: PPO

## 2016-08-18 DIAGNOSIS — M9080 Osteopathy in diseases classified elsewhere, unspecified site: Secondary | ICD-10-CM | POA: Diagnosis not present

## 2016-08-18 DIAGNOSIS — E559 Vitamin D deficiency, unspecified: Secondary | ICD-10-CM | POA: Diagnosis not present

## 2016-08-18 DIAGNOSIS — I129 Hypertensive chronic kidney disease with stage 1 through stage 4 chronic kidney disease, or unspecified chronic kidney disease: Secondary | ICD-10-CM | POA: Diagnosis not present

## 2016-08-18 DIAGNOSIS — N183 Chronic kidney disease, stage 3 (moderate): Secondary | ICD-10-CM | POA: Diagnosis not present

## 2016-08-18 DIAGNOSIS — E889 Metabolic disorder, unspecified: Secondary | ICD-10-CM | POA: Diagnosis not present

## 2016-08-18 NOTE — Progress Notes (Signed)
  Radiation Oncology         [phone] ________________________________  Name: Renee Vance MRN: [id number]  Date: 08/10/2016  DOB: 05-12-1941  End of Treatment Note  Diagnosis:  Stage IIA (T2a, N1, M0) non-small cell lung cancer, squamous cell carcinoma, presented with left hilar mass  Indication for treatment:  Curative with a definitive course of radiation along with radiosensitizing chemotherapy and possible upper lobectomy  Radiation treatment dates:   07/05/16 - 08/09/16  Site/dose:   Left Lung: 45 Gy in 25 fractions  Beams/energy:   3D // [employer]  Narrative: The patient tolerated radiation treatment relatively well. The patient developed intermittent burning in the chest that did not occur when eating. She took carafate 2-3 times per day, but the effectiveness has waned. She reported more SOB during treatment and a dry cough. She also reported fatigue. She did not get her 6th cycle of chemo due to her absolute neutrophil count being low. Therefore, her systemic chemotherapy was discontinued.  Plan: The patient has completed radiation treatment. The patient will return to radiation oncology clinic for routine followup in one month. I advised them to call or return sooner if they have any questions or concerns related to their recovery or treatment.  -----------------------------------  Blair Promise, PhD, MD  This document serves as a record of services personally performed by Gery Pray, MD. It was created on his behalf by Darcus Austin, a trained medical scribe. The creation of this record is based on the scribe's personal observations and the provider's statements to them. This document has been checked and approved by the attending provider.

## 2016-08-22 DIAGNOSIS — N183 Chronic kidney disease, stage 3 (moderate): Secondary | ICD-10-CM | POA: Diagnosis not present

## 2016-08-22 DIAGNOSIS — E559 Vitamin D deficiency, unspecified: Secondary | ICD-10-CM | POA: Diagnosis not present

## 2016-08-22 DIAGNOSIS — E889 Metabolic disorder, unspecified: Secondary | ICD-10-CM | POA: Diagnosis not present

## 2016-08-22 DIAGNOSIS — I129 Hypertensive chronic kidney disease with stage 1 through stage 4 chronic kidney disease, or unspecified chronic kidney disease: Secondary | ICD-10-CM | POA: Diagnosis not present

## 2016-08-22 DIAGNOSIS — M9080 Osteopathy in diseases classified elsewhere, unspecified site: Secondary | ICD-10-CM | POA: Diagnosis not present

## 2016-08-25 ENCOUNTER — Ambulatory Visit (INDEPENDENT_AMBULATORY_CARE_PROVIDER_SITE_OTHER): Payer: PPO | Admitting: *Deleted

## 2016-08-25 DIAGNOSIS — I480 Paroxysmal atrial fibrillation: Secondary | ICD-10-CM

## 2016-08-25 DIAGNOSIS — M546 Pain in thoracic spine: Secondary | ICD-10-CM | POA: Diagnosis not present

## 2016-08-25 DIAGNOSIS — Z5181 Encounter for therapeutic drug level monitoring: Secondary | ICD-10-CM

## 2016-08-25 DIAGNOSIS — M9901 Segmental and somatic dysfunction of cervical region: Secondary | ICD-10-CM | POA: Diagnosis not present

## 2016-08-25 DIAGNOSIS — M47812 Spondylosis without myelopathy or radiculopathy, cervical region: Secondary | ICD-10-CM | POA: Diagnosis not present

## 2016-08-25 DIAGNOSIS — M9903 Segmental and somatic dysfunction of lumbar region: Secondary | ICD-10-CM | POA: Diagnosis not present

## 2016-08-25 DIAGNOSIS — S134XXA Sprain of ligaments of cervical spine, initial encounter: Secondary | ICD-10-CM | POA: Diagnosis not present

## 2016-08-25 DIAGNOSIS — M9902 Segmental and somatic dysfunction of thoracic region: Secondary | ICD-10-CM | POA: Diagnosis not present

## 2016-08-25 DIAGNOSIS — M47816 Spondylosis without myelopathy or radiculopathy, lumbar region: Secondary | ICD-10-CM | POA: Diagnosis not present

## 2016-08-25 LAB — POCT INR: INR: 1.5

## 2016-09-07 ENCOUNTER — Telehealth: Payer: Self-pay | Admitting: Oncology

## 2016-09-07 DIAGNOSIS — I10 Essential (primary) hypertension: Secondary | ICD-10-CM | POA: Diagnosis not present

## 2016-09-07 DIAGNOSIS — J449 Chronic obstructive pulmonary disease, unspecified: Secondary | ICD-10-CM | POA: Diagnosis not present

## 2016-09-07 DIAGNOSIS — H2513 Age-related nuclear cataract, bilateral: Secondary | ICD-10-CM | POA: Diagnosis not present

## 2016-09-07 NOTE — Telephone Encounter (Signed)
Renee Vance called asking if she still needs to take over the counter cough medicine.  She said she is only coughing occasionally now and denies having hemoptysis.  Advised her to try taking it once a day for a few days to see how she feels.  And then stop taking if her cough does not return.  Renee Vance verbalized agreement and understanding.

## 2016-09-08 ENCOUNTER — Ambulatory Visit (INDEPENDENT_AMBULATORY_CARE_PROVIDER_SITE_OTHER): Payer: PPO | Admitting: *Deleted

## 2016-09-08 DIAGNOSIS — I480 Paroxysmal atrial fibrillation: Secondary | ICD-10-CM | POA: Diagnosis not present

## 2016-09-08 DIAGNOSIS — Z5181 Encounter for therapeutic drug level monitoring: Secondary | ICD-10-CM

## 2016-09-08 LAB — POCT INR: INR: 3

## 2016-09-15 ENCOUNTER — Ambulatory Visit: Payer: Self-pay | Admitting: Radiation Oncology

## 2016-09-19 ENCOUNTER — Other Ambulatory Visit (HOSPITAL_BASED_OUTPATIENT_CLINIC_OR_DEPARTMENT_OTHER): Payer: PPO

## 2016-09-19 ENCOUNTER — Ambulatory Visit (HOSPITAL_COMMUNITY)
Admission: RE | Admit: 2016-09-19 | Discharge: 2016-09-19 | Disposition: A | Discharge status: Home / Self Care | Payer: PPO | Source: Ambulatory Visit | Attending: Internal Medicine | Admitting: Internal Medicine

## 2016-09-19 ENCOUNTER — Encounter (HOSPITAL_COMMUNITY): Payer: Self-pay

## 2016-09-19 DIAGNOSIS — M47816 Spondylosis without myelopathy or radiculopathy, lumbar region: Secondary | ICD-10-CM | POA: Diagnosis not present

## 2016-09-19 DIAGNOSIS — C3492 Malignant neoplasm of unspecified part of left bronchus or lung: Secondary | ICD-10-CM | POA: Diagnosis not present

## 2016-09-19 DIAGNOSIS — Z5111 Encounter for antineoplastic chemotherapy: Secondary | ICD-10-CM

## 2016-09-19 DIAGNOSIS — M47812 Spondylosis without myelopathy or radiculopathy, cervical region: Secondary | ICD-10-CM | POA: Diagnosis not present

## 2016-09-19 DIAGNOSIS — M9903 Segmental and somatic dysfunction of lumbar region: Secondary | ICD-10-CM | POA: Diagnosis not present

## 2016-09-19 DIAGNOSIS — M9902 Segmental and somatic dysfunction of thoracic region: Secondary | ICD-10-CM | POA: Diagnosis not present

## 2016-09-19 DIAGNOSIS — S134XXA Sprain of ligaments of cervical spine, initial encounter: Secondary | ICD-10-CM | POA: Diagnosis not present

## 2016-09-19 DIAGNOSIS — C3490 Malignant neoplasm of unspecified part of unspecified bronchus or lung: Secondary | ICD-10-CM | POA: Diagnosis not present

## 2016-09-19 DIAGNOSIS — M9901 Segmental and somatic dysfunction of cervical region: Secondary | ICD-10-CM | POA: Diagnosis not present

## 2016-09-19 DIAGNOSIS — M546 Pain in thoracic spine: Secondary | ICD-10-CM | POA: Diagnosis not present

## 2016-09-19 LAB — CBC WITH DIFFERENTIAL/PLATELET
BASO%: 0.3 % (ref 0.0–2.0)
Basophils Absolute: 0 10*3/uL (ref 0.0–0.1)
EOS%: 3 % (ref 0.0–7.0)
Eosinophils Absolute: 0.2 10*3/uL (ref 0.0–0.5)
HCT: 30.2 % — ABNORMAL LOW (ref 34.8–46.6)
HGB: 9.9 g/dL — ABNORMAL LOW (ref 11.6–15.9)
LYMPH#: 0.7 10*3/uL — AB (ref 0.9–3.3)
LYMPH%: 12.5 % — AB (ref 14.0–49.7)
MCH: 30.1 pg (ref 25.1–34.0)
MCHC: 32.8 g/dL (ref 31.5–36.0)
MCV: 91.8 fL (ref 79.5–101.0)
MONO#: 0.9 10*3/uL (ref 0.1–0.9)
MONO%: 14.3 % — ABNORMAL HIGH (ref 0.0–14.0)
NEUT%: 69.9 % (ref 38.4–76.8)
NEUTROS ABS: 4.2 10*3/uL (ref 1.5–6.5)
PLATELETS: 193 10*3/uL (ref 145–400)
RBC: 3.29 10*6/uL — ABNORMAL LOW (ref 3.70–5.45)
RDW: 19.1 % — ABNORMAL HIGH (ref 11.2–14.5)
WBC: 5.9 10*3/uL (ref 3.9–10.3)

## 2016-09-19 LAB — COMPREHENSIVE METABOLIC PANEL
ALT: 23 U/L (ref 0–55)
ANION GAP: 9 meq/L (ref 3–11)
AST: 25 U/L (ref 5–34)
Albumin: 3 g/dL — ABNORMAL LOW (ref 3.5–5.0)
Alkaline Phosphatase: 86 U/L (ref 40–150)
BILIRUBIN TOTAL: 0.57 mg/dL (ref 0.20–1.20)
BUN: 22.6 mg/dL (ref 7.0–26.0)
CO2: 26 meq/L (ref 22–29)
CREATININE: 1.4 mg/dL — AB (ref 0.6–1.1)
Calcium: 9.7 mg/dL (ref 8.4–10.4)
Chloride: 107 mEq/L (ref 98–109)
EGFR: 36 mL/min/{1.73_m2} — ABNORMAL LOW (ref >90)
GLUCOSE: 85 mg/dL (ref 70–140)
Potassium: 4.3 mEq/L (ref 3.5–5.1)
Sodium: 142 mEq/L (ref 136–145)
TOTAL PROTEIN: 6.8 g/dL (ref 6.4–8.3)

## 2016-09-19 MED ORDER — IOPAMIDOL (ISOVUE-300) INJECTION 61%
75.0000 mL | Freq: Once | INTRAVENOUS | Status: DC | PRN
  Administered 2016-09-19: 60 mL via INTRAVENOUS
  Filled 2016-09-19: qty 75

## 2016-09-19 MED ORDER — IOPAMIDOL (ISOVUE-300) INJECTION 61%
INTRAVENOUS | Status: AC
  Filled 2016-09-19: qty 75

## 2016-09-19 MED ORDER — SODIUM CHLORIDE 0.9 % IJ SOLN
INTRAMUSCULAR | Status: AC
  Filled 2016-09-19: qty 50

## 2016-09-26 ENCOUNTER — Encounter: Payer: Self-pay | Admitting: Internal Medicine

## 2016-09-26 ENCOUNTER — Ambulatory Visit (HOSPITAL_BASED_OUTPATIENT_CLINIC_OR_DEPARTMENT_OTHER): Payer: PPO | Admitting: Internal Medicine

## 2016-09-26 ENCOUNTER — Telehealth: Payer: Self-pay | Admitting: Internal Medicine

## 2016-09-26 VITALS — BP 134/61 | HR 61 | Temp 97.8°F | Resp 18 | Ht 64.0 in | Wt 153.4 lb

## 2016-09-26 DIAGNOSIS — R05 Cough: Secondary | ICD-10-CM

## 2016-09-26 DIAGNOSIS — C3492 Malignant neoplasm of unspecified part of left bronchus or lung: Secondary | ICD-10-CM

## 2016-09-26 DIAGNOSIS — R0609 Other forms of dyspnea: Secondary | ICD-10-CM

## 2016-09-26 DIAGNOSIS — I10 Essential (primary) hypertension: Secondary | ICD-10-CM

## 2016-09-26 DIAGNOSIS — J449 Chronic obstructive pulmonary disease, unspecified: Secondary | ICD-10-CM | POA: Diagnosis not present

## 2016-09-26 DIAGNOSIS — D6481 Anemia due to antineoplastic chemotherapy: Secondary | ICD-10-CM

## 2016-09-26 DIAGNOSIS — Z5111 Encounter for antineoplastic chemotherapy: Secondary | ICD-10-CM

## 2016-09-26 DIAGNOSIS — T451X5A Adverse effect of antineoplastic and immunosuppressive drugs, initial encounter: Secondary | ICD-10-CM | POA: Insufficient documentation

## 2016-09-26 HISTORY — DX: Anemia due to antineoplastic chemotherapy: D64.81

## 2016-09-26 NOTE — Progress Notes (Signed)
[facility] [phone]   [phone]  OFFICE PROGRESS NOTE  [name], PA-C [place] [address]  DIAGNOSIS: Stage IIB (T2a, N1, M0) non-small cell lung cancer, squamous cell carcinoma presented with left hilar mass diagnosed in August 2017.  PRIOR THERAPY: Concurrent chemoradiation with weekly carboplatin for AUC of 2 and paclitaxel 45 MG/M2 started 07/05/2016, status post 5 cycles. Last cycle was given 08/01/2016.  CURRENT THERAPY: Observation.  INTERVAL HISTORY: Renee Vance 75 y.o. female returns to the clinic today for follow-up visit accompanied by her husband. The patient tolerated the previous course of concurrent chemoradiation fairly well except for fatigue. She continues to have shortness of breath with exertion as well as mild dry cough. She has no fever or chills. She has no nausea or vomiting. She denied having any chest pain or hemoptysis. She denied having any headache or visual changes. She had repeat CT scan of the chest performed recently and she is here for evaluation and discussion of her scan results.  MEDICAL HISTORY: Past Medical History:  Diagnosis Date  . Bradycardia   . CAD (coronary artery disease)   . COPD (chronic obstructive pulmonary disease) ([place])   . Coronary atherosclerosis of native coronary artery 2011  . Dyspnea   . Dyspnea   . Encounter for antineoplastic chemotherapy 06/23/2016  . Fibrocystic breast   . HTN (hypertension)   . HTN (hypertension)   . Hyperlipidemia   . Hypothyroidism   . Obesity   . Pain in joint   . Stage II squamous cell carcinoma of left lung ([place]) 06/23/2016    ALLERGIES:  is allergic to ampicillin and codeine.  MEDICATIONS:  Current Outpatient Prescriptions  Medication Sig Dispense Refill  . amLODipine (NORVASC) 5 MG tablet Take 5 mg by mouth daily.    Marland Kitchen aspirin 81 MG tablet Take 81 mg by mouth daily.    Marland Kitchen atorvastatin (LIPITOR) 80 MG tablet  Take 80 mg by mouth daily.    Marland Kitchen dextromethorphan (DELSYM) 30 MG/5ML liquid Take 60 mg by mouth.    . levothyroxine (SYNTHROID, LEVOTHROID) 125 MCG tablet Take 125 mcg by mouth daily before breakfast.     . losartan (COZAAR) 100 MG tablet Take 100 mg by mouth daily.    . metoprolol succinate (TOPROL-XL) 50 MG 24 hr tablet Take 1 tablet (50 mg total) by mouth 2 (two) times daily. 180 tablet 1  . tiotropium (SPIRIVA) 18 MCG inhalation capsule Place 18 mcg into inhaler and inhale daily.    . Vitamin D, Cholecalciferol, 1000 UNITS CAPS Take 1,000 Units by mouth daily.     Marland Kitchen warfarin (COUMADIN) 5 MG tablet Take 1/2 tablet daily except 1 tablet on Sundays and Thursdays 90 tablet 3  . prochlorperazine (COMPAZINE) 10 MG tablet Take 1 tablet (10 mg total) by mouth every 6 (six) hours as needed for nausea or vomiting. (Patient not taking: Reported on 09/26/2016) 30 tablet 0   No current facility-administered medications for this visit.     SURGICAL HISTORY:  Past Surgical History:  Procedure Laterality Date  . CHOLECYSTECTOMY    . CORONARY ANGIOPLASTY WITH STENT PLACEMENT  2011   Lmain 30-40%, LAD 65-75% (FFR 0.88), CFX 55-60%, RCA 95%>0 w/ 2.5 x 12 mm monorail stent  . TUBAL LIGATION    . VIDEO BRONCHOSCOPY Bilateral 06/07/2016   Procedure: VIDEO BRONCHOSCOPY WITH FLUORO;  Surgeon: [name], MD;  Location: [facility];  Service: Cardiopulmonary;  Laterality: Bilateral;    REVIEW OF SYSTEMS:  Constitutional: positive for fatigue Eyes: negative Ears, nose, mouth, throat, and face: negative Respiratory: positive for cough and dyspnea on exertion Cardiovascular: negative Gastrointestinal: negative Genitourinary:negative Integument/breast: negative Hematologic/lymphatic: negative Musculoskeletal:negative Neurological: negative Behavioral/Psych: negative Endocrine: negative Allergic/Immunologic: negative   PHYSICAL EXAMINATION: General appearance: alert, cooperative, fatigued and no  distress Head: Normocephalic, without obvious abnormality, atraumatic Neck: no adenopathy, no JVD, supple, symmetrical, trachea midline and thyroid not enlarged, symmetric, no tenderness/mass/nodules Lymph nodes: Cervical, supraclavicular, and axillary nodes normal. Resp: clear to auscultation bilaterally Back: symmetric, no curvature. ROM normal. No CVA tenderness. Cardio: regular rate and rhythm, S1, S2 normal, no murmur, click, rub or gallop GI: soft, non-tender; bowel sounds normal; no masses,  no organomegaly Extremities: extremities normal, atraumatic, no cyanosis or edema Neurologic: Alert and oriented X 3, normal strength and tone. Normal symmetric reflexes. Normal coordination and gait  ECOG PERFORMANCE STATUS: 1 - Symptomatic but completely ambulatory  Blood pressure 134/61, pulse 61, temperature 97.8 F (36.6 C), temperature source Oral, resp. rate 18, height '5\' 4"'$  (1.626 m), weight 153 lb 6.4 oz (69.6 kg), SpO2 96 %.  LABORATORY DATA: Lab Results  Component Value Date   WBC 5.9 09/19/2016   HGB 9.9 (L) 09/19/2016   HCT 30.2 (L) 09/19/2016   MCV 91.8 09/19/2016   PLT 193 09/19/2016      Chemistry      Component Value Date/Time   NA 142 09/19/2016 1141   K 4.3 09/19/2016 1141   CL 105 05/29/2016 0426   CO2 26 09/19/2016 1141   BUN 22.6 09/19/2016 1141   CREATININE 1.4 (H) 09/19/2016 1141      Component Value Date/Time   CALCIUM 9.7 09/19/2016 1141   ALKPHOS 86 09/19/2016 1141   AST 25 09/19/2016 1141   ALT 23 09/19/2016 1141   BILITOT 0.57 09/19/2016 1141       RADIOGRAPHIC STUDIES: Ct Chest W Contrast  Result Date: 09/19/2016 CLINICAL DATA:  Restaging lung cancer. EXAM: CT CHEST WITH CONTRAST TECHNIQUE: Multidetector CT imaging of the chest was performed during intravenous contrast administration. CONTRAST:  31m ISOVUE-300 IOPAMIDOL (ISOVUE-300) INJECTION 61% COMPARISON:  Chest CT 06/02/2016 and PET-CT 06/17/2016 FINDINGS: Chest wall: No breast masses,  supraclavicular or axillary lymphadenopathy. Cardiovascular: The heart is normal in size. No pericardial effusion. Stable atherosclerotic calcifications involving the thoracic aorta but no aneurysm or dissection. The branch vessels are patent. Moderate atherosclerotic calcifications. Advanced three-vessel coronary artery calcifications. The pulmonary arteries are grossly normal but limited opacification. Mediastinum/Nodes: No mediastinal or hilar mass or lymphadenopathy. A few small sub 7 mm lymph nodes are unchanged. New line the esophagus is grossly normal. Lungs/Pleura: Near complete resolution of the left infrahilar/lingular mass. This has a maximum diameter of 35 mm on the prior CT and now measures approximately 15.5 mm. There is some residual lingular atelectasis. Findings would certainly suggest an excellent response to therapy. 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 No acute overlying pulmonary process and no pleural effusion. Upper Abdomen: No significant upper abdominal findings. No evidence of hepatic or adrenal gland metastasis. Stable vascular calcifications. Musculoskeletal: No significant bony findings. No evidence of metastatic disease. IMPRESSION: Significant regression of left infrahilar/lingular mass suggesting an excellent response to therapy. No mediastinal or hilar lymphadenopathy. Stable peripheral interstitial lung disease but no findings suspicious for pulmonary metastatic disease. Electronically Signed   By: PMarijo SanesM.D.   On: 09/19/2016 14:59  ASSESSMENT AND PLAN: This is a very pleasant 75 years old white female with: 1) stage IIB non-small cell lung cancer, squamous cell carcinoma status post a course of concurrent chemoradiation with weekly carboplatin and paclitaxel. She tolerated the previous course of her treatment well except for fatigue. The patient had repeat CT scan of the  chest performed recently. I personally and independently reviewed the scan and discuss the results and showed the images to the patient and her husband today. The scan showed significant improvement of her disease. I discussed with the patient several options for management of her condition including continuous observation and close monitoring versus consideration of consolidation chemotherapy with carboplatin and paclitaxel every 3 weeks. I discussed with the patient adverse effect of this treatment. She is interested to continue on observation for now. I will see her back for follow-up visit in 3 months with repeat CT scan of the chest. 2) chemotherapy-induced anemia: Her hemoglobin and hematocrit are stable. I will continue to monitor her closely. 3) COPD: The patient will continue her current treatment with Aerosphere. 4) hypertension: She will continue on Norvasc, metoprolol and losartan. The patient was advised to call immediately if she has any concerning symptoms in the interval. The patient voices understanding of current disease status and treatment options and is in agreement with the current care plan.  All questions were answered. The patient knows to call the clinic with any problems, questions or concerns. We can certainly see the patient much sooner if necessary.  Disclaimer: This note was dictated with voice recognition software. Similar sounding words can inadvertently be transcribed and may not be corrected upon review.

## 2016-09-26 NOTE — Telephone Encounter (Signed)
Gave patient avs report and appointments for March. Central radiology will call re scan.  °

## 2016-09-28 ENCOUNTER — Telehealth: Payer: Self-pay | Admitting: *Deleted

## 2016-09-28 ENCOUNTER — Ambulatory Visit
Admission: RE | Admit: 2016-09-28 | Discharge: 2016-09-28 | Disposition: A | Discharge status: Home / Self Care | Payer: PPO | Source: Ambulatory Visit | Attending: Radiation Oncology | Admitting: Radiation Oncology

## 2016-09-28 ENCOUNTER — Encounter: Payer: Self-pay | Admitting: Oncology

## 2016-09-28 DIAGNOSIS — Z7982 Long term (current) use of aspirin: Secondary | ICD-10-CM | POA: Insufficient documentation

## 2016-09-28 DIAGNOSIS — Z923 Personal history of irradiation: Secondary | ICD-10-CM | POA: Insufficient documentation

## 2016-09-28 DIAGNOSIS — Z5189 Encounter for other specified aftercare: Secondary | ICD-10-CM | POA: Insufficient documentation

## 2016-09-28 DIAGNOSIS — Z7901 Long term (current) use of anticoagulants: Secondary | ICD-10-CM | POA: Insufficient documentation

## 2016-09-28 DIAGNOSIS — C3492 Malignant neoplasm of unspecified part of left bronchus or lung: Secondary | ICD-10-CM | POA: Insufficient documentation

## 2016-09-28 DIAGNOSIS — Z885 Allergy status to narcotic agent status: Secondary | ICD-10-CM | POA: Insufficient documentation

## 2016-09-28 DIAGNOSIS — Z880 Allergy status to penicillin: Secondary | ICD-10-CM | POA: Insufficient documentation

## 2016-09-28 DIAGNOSIS — Z79899 Other long term (current) drug therapy: Secondary | ICD-10-CM | POA: Diagnosis not present

## 2016-09-28 HISTORY — DX: Personal history of irradiation: Z92.3

## 2016-09-28 NOTE — Progress Notes (Signed)
Radiation Oncology         [phone] ________________________________  Name: Renee Vance MRN: [id number]  Date: 09/28/2016  DOB: [birth year]  Follow-Up Visit Note  CC: HEPLER,MARK, PA-C  Hepler, Mark, PA-C    ICD-9-CM ICD-10-CM   1. 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  She reports a cough; it is occasionally productive with clear sputum. She also reports SOB due to COPD. She reports occasional heartburn. She denies pain, swallowing/breathing difficulties, skin irritation, or fatigue. She would like to know if she needs to remain taking Delsym BID.                             ALLERGIES:  is allergic to ampicillin and codeine.  Meds: Current Outpatient Prescriptions  Medication Sig Dispense Refill  . amLODipine (NORVASC) 5 MG tablet Take 5 mg by mouth daily.    Marland Kitchen aspirin 81 MG tablet Take 81 mg by mouth daily.    Marland Kitchen atorvastatin (LIPITOR) 80 MG tablet Take 80 mg by mouth daily.    Marland Kitchen dextromethorphan (DELSYM) 30 MG/5ML liquid Take 60 mg by mouth.    . levothyroxine (SYNTHROID, LEVOTHROID) 125 MCG tablet Take 125 mcg by mouth daily before breakfast.     . losartan (COZAAR) 100 MG tablet Take 100 mg by mouth daily.    . metoprolol succinate (TOPROL-XL) 50 MG 24 hr tablet Take 1 tablet (50 mg total) by mouth 2 (two) times daily. 180 tablet 1  . tiotropium (SPIRIVA) 18 MCG inhalation capsule Place 18 mcg into inhaler and inhale daily.    . Vitamin D, Cholecalciferol, 1000 UNITS CAPS Take 1,000 Units by mouth daily.     Marland Kitchen warfarin (COUMADIN) 5 MG tablet Take 1/2 tablet daily except 1 tablet on Sundays and Thursdays 90 tablet 3  . prochlorperazine (COMPAZINE) 10 MG tablet  Take 1 tablet (10 mg total) by mouth every 6 (six) hours as needed for nausea or vomiting. (Patient not taking: Reported on 09/28/2016) 30 tablet 0   No current facility-administered medications for this encounter.     Physical Findings: The patient is in no acute distress. Patient is alert and oriented.  height is '5\' 4"'$  (1.626 m) and weight is 153 lb 9.6 oz (69.7 kg). Her oral temperature is 98.5 F (36.9 C). Her blood pressure is 134/86 and her pulse is 61. Her oxygen saturation is 97%. .   Lungs are clear to auscultation bilaterally. Heart has regular rate and rhythm. No palpable cervical, supraclavicular, or axillary adenopathy. Abdomen soft, non-tender, normal bowel sounds.  Lab Findings: Lab Results  Component Value Date   WBC 5.9 09/19/2016   HGB 9.9 (L) 09/19/2016   HCT 30.2 (L) 09/19/2016   MCV 91.8 09/19/2016   PLT 193 09/19/2016    Radiographic Findings: Ct Chest W Contrast  Result Date: 09/19/2016 CLINICAL DATA:  Restaging lung cancer. EXAM: CT CHEST WITH CONTRAST TECHNIQUE: Multidetector CT imaging of the chest was performed during intravenous contrast administration. CONTRAST:  34m ISOVUE-300 IOPAMIDOL (ISOVUE-300) INJECTION 61% COMPARISON:  Chest CT 06/02/2016 and PET-CT 06/17/2016 FINDINGS: Chest wall: No breast masses, supraclavicular or axillary lymphadenopathy.  Cardiovascular: The heart is normal in size. No pericardial effusion. Stable atherosclerotic calcifications involving the thoracic aorta but no aneurysm or dissection. The branch vessels are patent. Moderate atherosclerotic calcifications. Advanced three-vessel coronary artery calcifications. The pulmonary arteries are grossly normal but limited opacification. Mediastinum/Nodes: No mediastinal or hilar mass or lymphadenopathy. A few small sub 7 mm lymph nodes are unchanged. New line the esophagus is grossly normal. Lungs/Pleura: Near complete resolution of the left infrahilar/lingular mass. This has a maximum  diameter of 35 mm on the prior CT and now measures approximately 15.5 mm. There is some residual lingular atelectasis. Findings would certainly suggest an excellent response to therapy. 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 No acute overlying pulmonary process and no pleural effusion. Upper Abdomen: No significant upper abdominal findings. No evidence of hepatic or adrenal gland metastasis. Stable vascular calcifications. Musculoskeletal: No significant bony findings. No evidence of metastatic disease. IMPRESSION: Significant regression of left infrahilar/lingular mass suggesting an excellent response to therapy. No mediastinal or hilar lymphadenopathy. Stable peripheral interstitial lung disease but no findings suspicious for pulmonary metastatic disease. Electronically Signed   By: Marijo Sanes M.D.   On: 09/19/2016 14:59    Impression: Stage IIA (T2a, N1, M0) non-small cell lung cancer, squamous cell carcinoma, presented with left hilar mass. The patient is recovering from the effects of radiation. The patient has had a good response to radiation treatment. . She completed a pre-operative dose of radiation. Recent chest CT scan shows a good response to her treatment  Plan: Patient will be seen by Dr. Roxan Hockey again  to see if she will be a surgical candidate. She will need pulmonary function studies if these have not been performed yet. If The patient is not a surgical candidate then she will return to proceed to full dose radiation therapy with approximately 2 additional weeks of radiation therapy along with radiosensitizing chemotherapy.  ____________________________________    This document serves as a record of services personally performed by Gery Pray, MD. It was created on his behalf by Bethann Humble, a trained medical scribe. The creation of this record is based on the scribe's personal  observations and the provider's statements to them. This document has been checked and approved by the attending provider.

## 2016-09-28 NOTE — Telephone Encounter (Signed)
Oncology Nurse Navigator Documentation  Oncology Nurse Navigator Flowsheets 09/28/2016  Navigator Location [facility]  Navigator Encounter Type Telephone/per Dr. Julien Nordmann and Dr. Sondra Come, they would like patient to be evaluated by Dr. Roxan Hockey for possible surgical candidate. I notified [facility] to make appt   Telephone Outgoing Call  Treatment Phase Post-Tx Follow-up  Barriers/Navigation Needs Coordination of Care  Interventions Coordination of Care  Coordination of Care Appts  Acuity Level 2  Acuity Level 2 Assistance expediting appointments  Time Spent with Patient 30

## 2016-09-28 NOTE — Progress Notes (Signed)
Renee Vance is here for follow up.  She denies having any pain today.  She reports having an occasional cough with clear sputum.  She is wondering if she needs to keep taking delsym twice a day.  She reports having shortness of breath due to her COPD.  She reports feeling heartburn every one in a while.  She denies having any skin irritation or fatigue.  BP 134/86 (BP Location: Left Arm, Patient Position: Sitting)   Pulse 61   Temp 98.5 F (36.9 C) (Oral)   Ht '5\' 4"'$  (1.626 m)   Wt 153 lb 9.6 oz (69.7 kg)   SpO2 97%   BMI 26.37 kg/m    Wt Readings from Last 3 Encounters:  09/28/16 153 lb 9.6 oz (69.7 kg)  09/26/16 153 lb 6.4 oz (69.6 kg)  08/09/16 153 lb 3.2 oz (69.5 kg)

## 2016-09-28 NOTE — Telephone Encounter (Signed)
Ms. Sportsman called me and we spoke about next steps.  I updated her that Dr. Sondra Come and Dr. Julien Nordmann would like patient to see Dr. Roxan Hockey to discuss surgical resection.  I also updated her that I contacted [facility] office to call with an appt.  Patient was thankful for the update.

## 2016-09-29 ENCOUNTER — Ambulatory Visit (INDEPENDENT_AMBULATORY_CARE_PROVIDER_SITE_OTHER): Payer: PPO | Admitting: *Deleted

## 2016-09-29 DIAGNOSIS — Z5181 Encounter for therapeutic drug level monitoring: Secondary | ICD-10-CM | POA: Diagnosis not present

## 2016-09-29 DIAGNOSIS — I480 Paroxysmal atrial fibrillation: Secondary | ICD-10-CM | POA: Diagnosis not present

## 2016-09-29 LAB — POCT INR: INR: 2.5

## 2016-10-11 ENCOUNTER — Other Ambulatory Visit: Payer: Self-pay | Admitting: *Deleted

## 2016-10-11 ENCOUNTER — Encounter: Payer: Self-pay | Admitting: Thoracic Surgery (Cardiothoracic Vascular Surgery)

## 2016-10-11 ENCOUNTER — Ambulatory Visit (INDEPENDENT_AMBULATORY_CARE_PROVIDER_SITE_OTHER): Payer: PPO | Admitting: Thoracic Surgery (Cardiothoracic Vascular Surgery)

## 2016-10-11 VITALS — BP 137/63 | HR 78 | Resp 16 | Ht 64.0 in | Wt 153.0 lb

## 2016-10-11 DIAGNOSIS — C3492 Malignant neoplasm of unspecified part of left bronchus or lung: Secondary | ICD-10-CM

## 2016-10-11 DIAGNOSIS — M79661 Pain in right lower leg: Secondary | ICD-10-CM

## 2016-10-11 DIAGNOSIS — M79662 Pain in left lower leg: Principal | ICD-10-CM

## 2016-10-11 NOTE — Progress Notes (Signed)
[name]       [name] [address]             [phone]      PCP is [name], PA-C Referring Provider is [name], MD  Chief Complaint  Patient presents with  . Follow-up    after chemo to discuss surgical resection...? more radiation after this visit    HPI: Renee Vance returns today to discuss possible surgical resection.  He is a 76 year old woman with a history of past tobacco abuse (quit 2-1/2 years ago), COPD, coronary artery disease with previous stent placement in her LAD, paroxysmal atrial fibrillation, hypertension, hyperlipidemia, and obesity. She presented in August with hemoptysis. A CT showed a left hilar mass with possible mediastinal invasion. Bronchoscopy showed an endobronchial lesion in the left upper lobe. Biopsies were positive for squamous cell carcinoma.   Pulmonary function testing showed reasonable pulmonary reserve with an FEV1 of 70% predicted. Despite that she would get short of breath with relatively minimal activity such as walking up 5 stairs. She was definitely not a candidate for pneumonectomy, therefore she was referred for neoadjuvant chemoradiation. She received 4 cycles of carboplatin/ paclitaxel and 45 gray radiation. She had a repeat CT scan which showed significant decrease in the size of her left upper lobe mass.  She tolerated treatment relatively well. She did lose about 9 pounds and lost her hair. Otherwise she has felt reasonably well. She has not noticed any change in her breathing either positive or negative.  Zubrod Score: At the time of surgery this patient's most appropriate activity status/level should be described as: '[]'$     0    Normal activity, no symptoms '[x]'$     1    Restricted in physical strenuous activity but ambulatory, able to do out light work '[]'$     2    Ambulatory and capable of self care, unable to do work activities, up and about >50 % of waking hours                              '[]'$      3    Only limited self care, in bed greater than 50% of waking hours '[]'$     4    Completely disabled, no self care, confined to bed or chair '[]'$     5    Moribund  Past Medical History:  Diagnosis Date  . 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 Bradycardia   . CAD (coronary artery disease)   . COPD (chronic obstructive pulmonary disease) ([facility])   . Coronary atherosclerosis of native coronary artery 2011  . Dyspnea   . Dyspnea   . Encounter for antineoplastic chemotherapy 06/23/2016  . Fibrocystic breast   . History of radiation therapy 07/05/16 - 08/09/16    Left Lung: 45 Gy in 25 fractions  . HTN (hypertension)   . HTN (hypertension)   . Hyperlipidemia   . Hypothyroidism   . Obesity   . Pain in joint   . 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 CHOLECYSTECTOMY    . CORONARY ANGIOPLASTY WITH STENT PLACEMENT  2011   Lmain 30-40%, LAD 65-75% (FFR 0.88), CFX 55-60%, RCA 95%>0 w/ 2.5 x 12 mm monorail stent  . TUBAL LIGATION    . VIDEO BRONCHOSCOPY Bilateral 06/07/2016   Procedure:  VIDEO BRONCHOSCOPY WITH FLUORO;  Surgeon: [name], MD;  Location: [facility];  Service: Cardiopulmonary;  Laterality: Bilateral;    Family History  Problem Relation Age of Onset  . Heart disease Mother     Social History Social History  Substance Use Topics  . Smoking status: Former Smoker    Packs/day: 0.50    Years: 60.00    Quit date: 04/10/2014  . Smokeless tobacco: Never Used     Comment: june 2015  . Alcohol use No    Current Outpatient Prescriptions  Medication Sig Dispense Refill  . amLODipine (NORVASC) 5 MG tablet Take 5 mg by mouth daily.    Marland Kitchen aspirin 81 MG tablet Take 81 mg by mouth daily.    Marland Kitchen atorvastatin (LIPITOR) 80 MG tablet Take 80 mg by mouth daily.    Marland Kitchen dextromethorphan (DELSYM) 30 MG/5ML liquid Take 60 mg by mouth.    . levothyroxine (SYNTHROID, LEVOTHROID) 125 MCG tablet  Take 125 mcg by mouth daily before breakfast.     . losartan (COZAAR) 100 MG tablet Take 50 mg by mouth daily.     . metoprolol succinate (TOPROL-XL) 50 MG 24 hr tablet Take 1 tablet (50 mg total) by mouth 2 (two) times daily. 180 tablet 1  . tiotropium (SPIRIVA) 18 MCG inhalation capsule Place 18 mcg into inhaler and inhale daily.    . Vitamin D, Cholecalciferol, 1000 UNITS CAPS Take 1,000 Units by mouth daily.     Marland Kitchen warfarin (COUMADIN) 5 MG tablet Take 1/2 tablet daily except 1 tablet on Sundays and Thursdays 90 tablet 3  . prochlorperazine (COMPAZINE) 10 MG tablet Take 1 tablet (10 mg total) by mouth every 6 (six) hours as needed for nausea or vomiting. (Patient not taking: Reported on 10/11/2016) 30 tablet 0   No current facility-administered medications for this visit.     Allergies  Allergen Reactions  . Ampicillin Nausea And Vomiting  . Codeine Nausea And Vomiting    Review of Systems  Constitutional: Positive for unexpected weight change (lost 9 pounds with chemo).  HENT: Negative for trouble swallowing and voice change.   Eyes: Negative for visual disturbance.  Respiratory: Positive for shortness of breath (with 5 stairs).   Cardiovascular: Positive for palpitations (none recently). Negative for chest pain.  Gastrointestinal: Negative for abdominal pain and blood in stool.  Endocrine:       Hypothyroid  Musculoskeletal: Positive for arthralgias, joint swelling, neck pain and neck stiffness.  Neurological: Negative for seizures, syncope, weakness and headaches.  Hematological: Negative for adenopathy. Does not bruise/bleed easily.    BP 137/63 (BP Location: Right Arm, Patient Position: Sitting, Cuff Size: Large)   Pulse 78   Resp 16   Ht '5\' 4"'$  (1.626 m)   Wt 153 lb (69.4 kg)   SpO2 94% Comment: ON RA  BMI 26.26 kg/m  Physical Exam  Constitutional: She is oriented to person, place, and time. She appears well-nourished. No distress.  anxious  HENT:  Head: Normocephalic  and atraumatic.  alopecia  Eyes: Conjunctivae and EOM are normal. No scleral icterus.  Neck: Neck supple. No thyromegaly present.  Cardiovascular: Normal rate, regular rhythm and normal heart sounds.   No murmur heard. Pulmonary/Chest: Effort normal and breath sounds normal. No respiratory distress. She has no wheezes. She has no rales.  Abdominal: Soft. She exhibits no distension. There is no tenderness.  Musculoskeletal: She exhibits no edema.  Lymphadenopathy:    She has no cervical adenopathy.  Neurological: She  is alert and oriented to person, place, and time. A cranial nerve deficit is present.  Motor intact  Skin: Skin is warm and dry.  Vitals reviewed.    Diagnostic Tests: CT CHEST WITH CONTRAST  TECHNIQUE: Multidetector CT imaging of the chest was performed during intravenous contrast administration.  CONTRAST:  75m ISOVUE-300 IOPAMIDOL (ISOVUE-300) INJECTION 61%  COMPARISON:  Chest CT 06/02/2016 and PET-CT 06/17/2016  FINDINGS: Chest wall: No breast masses, supraclavicular or axillary lymphadenopathy.  Cardiovascular: The heart is normal in size. No pericardial effusion. Stable atherosclerotic calcifications involving the thoracic aorta but no aneurysm or dissection. The branch vessels are patent. Moderate atherosclerotic calcifications. Advanced three-vessel coronary artery calcifications. The pulmonary arteries are grossly normal but limited opacification.  Mediastinum/Nodes: No mediastinal or hilar mass or lymphadenopathy. A few small sub 7 mm lymph nodes are unchanged. New line the esophagus is grossly normal.  Lungs/Pleura: Near complete resolution of the left infrahilar/lingular mass. This has a maximum diameter of 35 mm on the prior CT and now measures approximately 15.5 mm. There is some residual lingular atelectasis. Findings would certainly suggest an excellent response to therapy.  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 No acute overlying pulmonary process and no pleural effusion.  Upper Abdomen: No significant upper abdominal findings. No evidence of hepatic or adrenal gland metastasis. Stable vascular calcifications.  Musculoskeletal: No significant bony findings. No evidence of metastatic disease.  IMPRESSION: Significant regression of left infrahilar/lingular mass suggesting an excellent response to therapy.  No mediastinal or hilar lymphadenopathy.  Stable peripheral interstitial lung disease but no findings suspicious for pulmonary metastatic disease.   Electronically Signed   By: PMarijo SanesM.D.   On: 09/19/2016 14:59 I personally reviewed the CT chest and compared to her previous CT and PET CT. I agree with the findings as noted above.  Pulmonary function testing September 2017 FVC 1.97 (73%) FEV1 1.38 (69%) FEV1 1.50 (74%) post bronchodilator DLCO 13.07 (55%)  6 minute walk test 10/11/2016 Walked 980 feet O2 sat 94% at rest 93% post Heart rate 78 3, 100 post  Impression: 76year old woman with multiple medical issues who has a stage IIB squamous cell carcinoma the left upper lobe. She has undergone combined chemoradiation and has had a good response to treatment. The left upper lobe mass is gone from about 35 mm in maximal diameter to 15 mm. There is still close proximity to the pericardium and a question of mediastinal invasion. There is no apparent adenopathy.  I had a long discussion with Mrs. SMartensonand her husband. We reviewed the films together. Based on her response to treatment I think there is a higher probability of being able to resect the mass with a lobectomy, but there is no guarantee that it is resectable because of its proximity to the mediastinum. Her symptoms of getting short of breath walking up 5 stairs gives me pause. However, her PFTs and 6 minute walk test indicate that she  could tolerate a lobectomy. She would not be a candidate for pneumonectomy. We discussed with the impact a lobectomy would have on her quality of life. It certainly will decrease her respiratory capacity. Testing indicates that she would have an acceptable pulmonary reserve to tolerate that, but only she can decide if she would have an acceptable quality of life.  She does understand that there is a possibility she could be cured with just chemotherapy and radiation. She understands that the  survival rate is better with surgical resection. She understands that there is a high risk of recurrence even if she has surgery.  She does need cardiology evaluation and clearance prior surgery. She is followed by Dr. Tamala Julian. She has no point see him in 3 weeks. I am going to see if it is possible to move that up.  Claudication- she complained of bilateral calf pain during her walk. She has known coronary disease and multiple risk factors for peripheral gel disease. I suspect that she does have PAD. I'm going to go ahead and order ABIs. The mainstay of treatment for that is going to be exercise. She has already quit smoking over 2 years ago.  She would like some time to think over her options before making a decision.  Plan: Cardiology evaluation with Dr. Tamala Julian.  I will see her back after she sees Dr. Tamala Julian to further discuss possible surgical resection.  I spent 30 minutes face-to-face with Mrs. Panameno during this visit Melrose Nakayama, MD [facility] [phone]

## 2016-10-12 ENCOUNTER — Other Ambulatory Visit: Payer: Self-pay | Admitting: Interventional Cardiology

## 2016-10-12 NOTE — Progress Notes (Signed)
Please schedule a Lexiscan Myoview. This is needed to clear her for lung surgery by Dr. Roxan Hockey.

## 2016-10-13 ENCOUNTER — Telehealth: Payer: Self-pay | Admitting: *Deleted

## 2016-10-13 NOTE — Telephone Encounter (Signed)
Pt called back and states they will come to appt on Monday.  Will cancel appt for 11/01/16.

## 2016-10-13 NOTE — Telephone Encounter (Signed)
Spoke with pt to get her scheduled for an earlier appt with Dr. Tamala Julian.  Pt states that she is unsure if she is going to proceed with having the procedure due to the complications and possible outcomes that were mentioned to her by Dr. Roxan Hockey.  She does want to know Dr. Thompson Caul thoughts before proceeding with the surgery.  She was willing to schedule an appt on 10/17/16 at [facility] 11/01/16 appt for now.  Pt is going to speak with her husband and call back to let me know which appt she would like to keep.

## 2016-10-13 NOTE — Telephone Encounter (Signed)
-----   Message from Belva Crome, MD sent at 10/12/2016  6:11 PM EST -----   ----- Message ----- From: Melrose Nakayama, MD Sent: 10/11/2016  12:58 PM To: Belva Crome, MD  Pain,  I saw Renee Vance in the office today. She is a 76 year old patient of yours with a history of coronary disease and paroxysmal atrial fibrillation. She has a stage IIb squamous cell carcinoma of the left upper lobe. She has completed chemotherapy and radiation and now we are considering surgical resection. I would appreciate your assessment of her cardiac fitness for a major operation.  She has an appointment with you scheduled for about 3 weeks. I know you're schedule is packed, but if there is any way she can be moved up it would be helpful in terms of timing of surgery in relation to completion of radiation.  Thank you  Richardson Landry

## 2016-10-17 ENCOUNTER — Encounter: Payer: Self-pay | Admitting: Interventional Cardiology

## 2016-10-17 ENCOUNTER — Ambulatory Visit (INDEPENDENT_AMBULATORY_CARE_PROVIDER_SITE_OTHER): Payer: PPO | Admitting: Interventional Cardiology

## 2016-10-17 VITALS — BP 126/66 | HR 65 | Ht 64.0 in | Wt 152.8 lb

## 2016-10-17 DIAGNOSIS — I10 Essential (primary) hypertension: Secondary | ICD-10-CM

## 2016-10-17 DIAGNOSIS — I25118 Atherosclerotic heart disease of native coronary artery with other forms of angina pectoris: Secondary | ICD-10-CM | POA: Diagnosis not present

## 2016-10-17 DIAGNOSIS — Z01818 Encounter for other preprocedural examination: Secondary | ICD-10-CM

## 2016-10-17 DIAGNOSIS — I480 Paroxysmal atrial fibrillation: Secondary | ICD-10-CM | POA: Diagnosis not present

## 2016-10-17 DIAGNOSIS — J431 Panlobular emphysema: Secondary | ICD-10-CM | POA: Diagnosis not present

## 2016-10-17 NOTE — Progress Notes (Signed)
Cardiology Office Note    Date:  10/17/2016   ID:  Renee Vance, DOB December 31, 1940, MRN [id number]  PCP:  Renee Vance  Cardiologist: Renee Grooms, MD   Chief Complaint  Patient presents with  . Atrial Fibrillation  . Pre-op Exam    History of Present Illness:  Renee Vance is a 76 y.o. female with cardiac history of paroxysmal atrial fibrillation, coronary artery disease with drug-eluting stent in the distal RCA and intermediate stenosis in the LAD had cath in 2011, COPD, hyperlipidemia, obesity, and relatively recent diagnosis of squamous cell carcinoma of the left upper lobe. She is status post radiochemotherapy with good response. She is now being considered for left upper lobectomy. FEV1 is 70%.  The patient is referred by Dr. Roxan Vance for preoperative clearance for possible left upper lobectomy. The patient is not having angina or cardiac ischemic complaints. There are no symptoms to suggest angina. She does have dyspnea with minimal activity. She denies orthopnea. She has normal LV function.  She now states that she has decided not to have left upper lobectomy.    Past Medical History:  Diagnosis Date  . 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 Bradycardia   . CAD (coronary artery disease)   . COPD (chronic obstructive pulmonary disease) ([place])   . Coronary atherosclerosis of native coronary artery 2011  . Dyspnea   . Dyspnea   . Encounter for antineoplastic chemotherapy 06/23/2016  . Fibrocystic breast   . History of radiation therapy 07/05/16 - 08/09/16    Left Lung: 45 Gy in 25 fractions  . HTN (hypertension)   . HTN (hypertension)   . Hyperlipidemia   . Hypothyroidism   . Obesity   . Pain in joint   . 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 CHOLECYSTECTOMY    . CORONARY ANGIOPLASTY WITH STENT PLACEMENT  2011   [place] 30-40%, LAD 65-75% (FFR  0.88), CFX 55-60%, RCA 95%>0 w/ 2.5 x 12 mm monorail stent  . TUBAL LIGATION    . VIDEO BRONCHOSCOPY Bilateral 06/07/2016   Procedure: VIDEO BRONCHOSCOPY WITH FLUORO;  Surgeon: [name], MD;  Location: [facility];  Service: Cardiopulmonary;  Laterality: Bilateral;    Current Medications: Outpatient Medications Prior to Visit  Medication Sig Dispense Refill  . amLODipine (NORVASC) 5 MG tablet Take 5 mg by mouth daily.    Marland Kitchen aspirin 81 MG tablet Take 81 mg by mouth daily.    Marland Kitchen atorvastatin (LIPITOR) 80 MG tablet Take 80 mg by mouth daily.    Marland Kitchen dextromethorphan (DELSYM) 30 MG/5ML liquid Take 60 mg by mouth.    . levothyroxine (SYNTHROID, LEVOTHROID) 125 MCG tablet Take 125 mcg by mouth daily before breakfast.     . metoprolol succinate (TOPROL-XL) 50 MG 24 hr tablet Take 1 tablet (50 mg total) by mouth 2 (two) times daily. 180 tablet 1  . prochlorperazine (COMPAZINE) 10 MG tablet Take 1 tablet (10 mg total) by mouth every 6 (six) hours as needed for nausea or vomiting. 30 tablet 0  . tiotropium (SPIRIVA) 18 MCG inhalation capsule Place 18 mcg into inhaler and inhale daily.    . Vitamin D, Cholecalciferol, 1000 UNITS CAPS Take 1,000 Units by mouth daily.     Marland Kitchen warfarin (COUMADIN) 5 MG tablet Take 1/2 tablet daily except 1 tablet on Sundays and Thursdays 90 tablet 3  . losartan (COZAAR) 100 MG  tablet Take 50 mg by mouth daily.     . metoprolol succinate (TOPROL-XL) 50 MG 24 hr tablet TAKE ONE TABLET BY MOUTH TWICE DAILY (Patient not taking: Reported on 10/17/2016) 180 tablet 1   No facility-administered medications prior to visit.      Allergies:   Ampicillin and Codeine   Social History   Social History  . Marital status: Married    Spouse name: N/A  . Number of children: N/A  . Years of education: N/A   Social History Main Topics  . Smoking status: Former Smoker    Packs/day: 0.50    Years: 60.00    Quit date: 04/10/2014  . Smokeless tobacco: Never Used     Comment: june 2015    . Alcohol use No  . Drug use: Unknown  . Sexual activity: Not Asked   Other Topics Concern  . None   Social History Narrative  . None     Family History:  The patient's family history includes Heart disease in her mother.   ROS:   Please see the history of present illness.    Cough, dyspnea with mild to moderate physical activity. No hemoptysis.   All other systems reviewed and are negative.   PHYSICAL EXAM:   VS:  BP 126/66 (BP Location: Right Arm)   Pulse 65   Ht '5\' 4"'$  (1.626 m)   Wt 152 lb 12.8 oz (69.3 kg)   BMI 26.23 kg/m    GEN: Well nourished, well developed, in no acute distress . Short hair secondary to recent chemotherapy. HEENT: normal  Neck: no JVD, carotid bruits, or masses Cardiac: RRR; no murmurs, rubs, or gallops,no edema  Respiratory:  clear to auscultation bilaterally, normal work of breathing GI: soft, nontender, nondistended, + BS MS: no deformity or atrophy  Skin: warm and dry, no rash Neuro:  Alert and Oriented x 3, Strength and sensation are intact Psych: euthymic mood, full affect  Wt Readings from Last 3 Encounters:  10/17/16 152 lb 12.8 oz (69.3 kg)  10/11/16 153 lb (69.4 kg)  09/28/16 153 lb 9.6 oz (69.7 kg)      Studies/Labs Reviewed:   EKG:  EKG  Normal sinus rhythm with normal EKG appearance  Recent Labs: 09/19/2016: ALT 23; BUN 22.6; Creatinine 1.4; HGB 9.9; Platelets 193; Potassium 4.3; Sodium 142   Lipid Panel No results found for: CHOL, TRIG, HDL, CHOLHDL, VLDL, LDLCALC, LDLDIRECT  Additional studies/ records that were reviewed today include:  None    ASSESSMENT:    1. PAF (paroxysmal atrial fibrillation) ([place])   2. Essential hypertension   3. Atherosclerosis of native coronary artery of native heart with stable angina pectoris ([place])   4. Panlobular emphysema ([place])   5. Preoperative clearance      PLAN:  In order of problems listed above:  1. Currently in sinus rhythm and no symptoms to suggest recurrent  prolonged atrial fibrillation. 2. Excellent control. No changes in therapy. 3. Asymptomatic from the standpoint of coronary disease. EKG is nonischemic. No nitroglycerin use. Distal RCA stent in 2011 with borderline LAD lesion with FFR 0.88. She has decided against left upper lobectomy. I planned to perform a myocardial perfusion study but we'll now put this on hold if surgery will not be performed. We will simply continue medical therapy for asymptomatic coronary disease. 4. Symptomatic dyspnea on exertion. It doesn't appear to be a volume overload component. 5. A myocardial perfusion study would be required to clear the patient for left  upper lobectomy. I have put performing this study on hold since and now appears she has decided against going forward with surgery. I will for these findings to Dr. Chauncey Cruel. [place].   Plan to see the patient again in 9-12 months/when necessary prior to then if required.    Medication Adjustments/Labs and Tests Ordered: Current medicines are reviewed at length with the patient today.  Concerns regarding medicines are outlined above.  Medication changes, Labs and Tests ordered today are listed in the Patient Instructions below. Patient Instructions  Medication Instructions:  None  Labwork: None  Testing/Procedures: None  Follow-Up: Your physician wants you to follow-up in: 1 year with Dr. Tamala Julian.  You will receive a reminder letter in the mail two months in advance. If you don't receive a letter, please call our office to schedule the follow-up appointment.   Any Other Special Instructions Will Be Listed Below (If Applicable).     If you need a refill on your cardiac medications before your next appointment, please call your pharmacy.      Signed, Renee Grooms, MD  10/17/2016 8:35 AM    [facility] [place], [place], [place]  [address] Phone: [phone]; Fax: [phone]

## 2016-10-17 NOTE — Patient Instructions (Signed)

## 2016-10-18 DIAGNOSIS — J449 Chronic obstructive pulmonary disease, unspecified: Secondary | ICD-10-CM | POA: Diagnosis not present

## 2016-10-18 DIAGNOSIS — R6889 Other general symptoms and signs: Secondary | ICD-10-CM | POA: Diagnosis not present

## 2016-10-18 DIAGNOSIS — J101 Influenza due to other identified influenza virus with other respiratory manifestations: Secondary | ICD-10-CM | POA: Diagnosis not present

## 2016-10-20 ENCOUNTER — Ambulatory Visit (INDEPENDENT_AMBULATORY_CARE_PROVIDER_SITE_OTHER): Payer: PPO | Admitting: *Deleted

## 2016-10-20 ENCOUNTER — Telehealth: Payer: Self-pay | Admitting: *Deleted

## 2016-10-20 DIAGNOSIS — Z5181 Encounter for therapeutic drug level monitoring: Secondary | ICD-10-CM | POA: Diagnosis not present

## 2016-10-20 DIAGNOSIS — I480 Paroxysmal atrial fibrillation: Secondary | ICD-10-CM | POA: Diagnosis not present

## 2016-10-20 LAB — POCT INR: INR: 1.8

## 2016-10-20 NOTE — Telephone Encounter (Signed)
Oncology Nurse Navigator Documentation  Oncology Nurse Navigator Flowsheets 10/20/2016  Navigator Location [facility]  Navigator Encounter Type Telephone/I called to follow up with Renee Vance.  She states she does not want surgery.  I updated her that I will contact Dr. Sondra Come and Dr. Julien Nordmann to see about follow up appt.  She was thankful for the call.   Telephone Outgoing Call  Treatment Phase Follow-up  Barriers/Navigation Needs Coordination of Care  Interventions Coordination of Care  Coordination of Care Appts  Acuity Level 2  Time Spent with Patient 30

## 2016-10-21 ENCOUNTER — Telehealth: Payer: Self-pay | Admitting: *Deleted

## 2016-10-21 NOTE — Telephone Encounter (Signed)
Oncology Nurse Navigator Documentation  Oncology Nurse Navigator Flowsheets 10/21/2016  Navigator Location [facility]  Navigator Encounter Type Telephone/I called Renee Vance today.  Her and her husband are dealing with the flu.  They have contacted their PCP regarding flu issues.  I did update Renee Vance on her appt with Dr. Julien Nordmann and Dr. Sondra Come on 10/27/16.  She verbalized understanding of appt time and place.   Telephone Outgoing Call  Treatment Phase Follow-up  Barriers/Navigation Needs Coordination of Care  Interventions Coordination of Care  Coordination of Care Appts  Acuity Level 1  Acuity Level 1 Minimal follow up required  Time Spent with Patient 15

## 2016-10-24 ENCOUNTER — Telehealth: Payer: Self-pay | Admitting: *Deleted

## 2016-10-24 NOTE — Telephone Encounter (Signed)
Pt called.  States she is starting on a Z Pack today and wants to know what to do about her coumadin.  Told pt to decrease coumadin to 2.'5mg'$  daily (today thru Thursday).  On Friday resume '5mg'$  daily except 2.'5mg'$  on Tuesdays, Thursdays and Saturdays. She verbalized understanding.

## 2016-10-24 NOTE — Telephone Encounter (Signed)
Mailed MTOC packet to pt.  

## 2016-10-25 ENCOUNTER — Encounter: Payer: PPO | Admitting: Thoracic Surgery (Cardiothoracic Vascular Surgery)

## 2016-10-27 ENCOUNTER — Telehealth: Payer: Self-pay | Admitting: *Deleted

## 2016-10-27 ENCOUNTER — Encounter: Payer: Self-pay | Admitting: Radiation Oncology

## 2016-10-27 ENCOUNTER — Ambulatory Visit: Payer: PPO | Admitting: Physical Therapy

## 2016-10-27 ENCOUNTER — Other Ambulatory Visit: Payer: PPO

## 2016-10-27 ENCOUNTER — Ambulatory Visit: Payer: PPO | Admitting: Radiation Oncology

## 2016-10-27 ENCOUNTER — Ambulatory Visit: Payer: PPO | Admitting: Internal Medicine

## 2016-10-27 DIAGNOSIS — C3492 Malignant neoplasm of unspecified part of left bronchus or lung: Secondary | ICD-10-CM

## 2016-10-27 NOTE — Telephone Encounter (Signed)
Oncology Nurse Navigator Documentation  Oncology Nurse Navigator Flowsheets 10/27/2016  Navigator Location [facility]  Navigator Encounter Type Telephone/I called Renee Vance to schedule her for a follow up with Dr. Julien Nordmann.  She will be seen on 11/03/16 at 2:15 with labs prior.  She verbalized understanding of appt time and place. I will notify rad onc to schedule a follow up with Dr. Sondra Come.   Telephone Outgoing Call  Barriers/Navigation Needs Coordination of Care  Interventions Coordination of Care  Coordination of Care Appts  Acuity Level 1  Acuity Level 1 Minimal follow up required  Acuity Level 2 -  Time Spent with Patient 15

## 2016-10-28 ENCOUNTER — Telehealth: Payer: Self-pay | Admitting: Oncology

## 2016-10-28 ENCOUNTER — Encounter (HOSPITAL_COMMUNITY): Payer: Self-pay

## 2016-10-28 ENCOUNTER — Encounter: Payer: Self-pay | Admitting: *Deleted

## 2016-10-28 ENCOUNTER — Inpatient Hospital Stay (HOSPITAL_COMMUNITY)
Admission: EM | Admit: 2016-10-28 | Discharge: 2016-11-04 | DRG: 189 | Disposition: A | Discharge status: Home / Self Care | Payer: PPO | Attending: Family Medicine | Admitting: Family Medicine

## 2016-10-28 ENCOUNTER — Emergency Department (HOSPITAL_COMMUNITY): Payer: PPO

## 2016-10-28 DIAGNOSIS — C3412 Malignant neoplasm of upper lobe, left bronchus or lung: Secondary | ICD-10-CM | POA: Diagnosis present

## 2016-10-28 DIAGNOSIS — R05 Cough: Secondary | ICD-10-CM | POA: Diagnosis not present

## 2016-10-28 DIAGNOSIS — C3492 Malignant neoplasm of unspecified part of left bronchus or lung: Secondary | ICD-10-CM | POA: Diagnosis not present

## 2016-10-28 DIAGNOSIS — Y95 Nosocomial condition: Secondary | ICD-10-CM | POA: Diagnosis not present

## 2016-10-28 DIAGNOSIS — I2510 Atherosclerotic heart disease of native coronary artery without angina pectoris: Secondary | ICD-10-CM | POA: Diagnosis present

## 2016-10-28 DIAGNOSIS — E785 Hyperlipidemia, unspecified: Secondary | ICD-10-CM | POA: Diagnosis present

## 2016-10-28 DIAGNOSIS — T45515A Adverse effect of anticoagulants, initial encounter: Secondary | ICD-10-CM | POA: Diagnosis not present

## 2016-10-28 DIAGNOSIS — N183 Chronic kidney disease, stage 3 (moderate): Secondary | ICD-10-CM | POA: Diagnosis not present

## 2016-10-28 DIAGNOSIS — Z87891 Personal history of nicotine dependence: Secondary | ICD-10-CM | POA: Diagnosis not present

## 2016-10-28 DIAGNOSIS — E860 Dehydration: Secondary | ICD-10-CM | POA: Diagnosis present

## 2016-10-28 DIAGNOSIS — E039 Hypothyroidism, unspecified: Secondary | ICD-10-CM | POA: Diagnosis not present

## 2016-10-28 DIAGNOSIS — N1330 Unspecified hydronephrosis: Secondary | ICD-10-CM | POA: Diagnosis present

## 2016-10-28 DIAGNOSIS — I5033 Acute on chronic diastolic (congestive) heart failure: Secondary | ICD-10-CM | POA: Diagnosis present

## 2016-10-28 DIAGNOSIS — Z880 Allergy status to penicillin: Secondary | ICD-10-CM

## 2016-10-28 DIAGNOSIS — E669 Obesity, unspecified: Secondary | ICD-10-CM | POA: Diagnosis not present

## 2016-10-28 DIAGNOSIS — J431 Panlobular emphysema: Secondary | ICD-10-CM

## 2016-10-28 DIAGNOSIS — J189 Pneumonia, unspecified organism: Secondary | ICD-10-CM | POA: Diagnosis not present

## 2016-10-28 DIAGNOSIS — J440 Chronic obstructive pulmonary disease with acute lower respiratory infection: Secondary | ICD-10-CM | POA: Diagnosis not present

## 2016-10-28 DIAGNOSIS — I130 Hypertensive heart and chronic kidney disease with heart failure and stage 1 through stage 4 chronic kidney disease, or unspecified chronic kidney disease: Secondary | ICD-10-CM | POA: Diagnosis present

## 2016-10-28 DIAGNOSIS — Z7901 Long term (current) use of anticoagulants: Secondary | ICD-10-CM

## 2016-10-28 DIAGNOSIS — J9620 Acute and chronic respiratory failure, unspecified whether with hypoxia or hypercapnia: Secondary | ICD-10-CM | POA: Diagnosis not present

## 2016-10-28 DIAGNOSIS — J181 Lobar pneumonia, unspecified organism: Secondary | ICD-10-CM

## 2016-10-28 DIAGNOSIS — I509 Heart failure, unspecified: Secondary | ICD-10-CM | POA: Diagnosis not present

## 2016-10-28 DIAGNOSIS — Z79899 Other long term (current) drug therapy: Secondary | ICD-10-CM

## 2016-10-28 DIAGNOSIS — N179 Acute kidney failure, unspecified: Secondary | ICD-10-CM | POA: Diagnosis not present

## 2016-10-28 DIAGNOSIS — J9611 Chronic respiratory failure with hypoxia: Secondary | ICD-10-CM | POA: Diagnosis present

## 2016-10-28 DIAGNOSIS — R944 Abnormal results of kidney function studies: Secondary | ICD-10-CM | POA: Diagnosis not present

## 2016-10-28 DIAGNOSIS — R509 Fever, unspecified: Secondary | ICD-10-CM | POA: Diagnosis not present

## 2016-10-28 DIAGNOSIS — J441 Chronic obstructive pulmonary disease with (acute) exacerbation: Secondary | ICD-10-CM | POA: Diagnosis present

## 2016-10-28 DIAGNOSIS — N2889 Other specified disorders of kidney and ureter: Secondary | ICD-10-CM | POA: Diagnosis present

## 2016-10-28 DIAGNOSIS — I480 Paroxysmal atrial fibrillation: Secondary | ICD-10-CM | POA: Diagnosis present

## 2016-10-28 DIAGNOSIS — R042 Hemoptysis: Secondary | ICD-10-CM | POA: Diagnosis not present

## 2016-10-28 DIAGNOSIS — D649 Anemia, unspecified: Secondary | ICD-10-CM | POA: Diagnosis not present

## 2016-10-28 DIAGNOSIS — Z923 Personal history of irradiation: Secondary | ICD-10-CM

## 2016-10-28 DIAGNOSIS — Z8249 Family history of ischemic heart disease and other diseases of the circulatory system: Secondary | ICD-10-CM

## 2016-10-28 DIAGNOSIS — J9621 Acute and chronic respiratory failure with hypoxia: Secondary | ICD-10-CM | POA: Diagnosis not present

## 2016-10-28 DIAGNOSIS — D689 Coagulation defect, unspecified: Secondary | ICD-10-CM | POA: Diagnosis present

## 2016-10-28 DIAGNOSIS — D62 Acute posthemorrhagic anemia: Secondary | ICD-10-CM

## 2016-10-28 DIAGNOSIS — Z9221 Personal history of antineoplastic chemotherapy: Secondary | ICD-10-CM

## 2016-10-28 DIAGNOSIS — D6481 Anemia due to antineoplastic chemotherapy: Secondary | ICD-10-CM | POA: Diagnosis present

## 2016-10-28 DIAGNOSIS — J810 Acute pulmonary edema: Secondary | ICD-10-CM | POA: Diagnosis not present

## 2016-10-28 DIAGNOSIS — J9601 Acute respiratory failure with hypoxia: Secondary | ICD-10-CM | POA: Diagnosis not present

## 2016-10-28 DIAGNOSIS — J449 Chronic obstructive pulmonary disease, unspecified: Secondary | ICD-10-CM | POA: Diagnosis not present

## 2016-10-28 DIAGNOSIS — R310 Gross hematuria: Secondary | ICD-10-CM | POA: Diagnosis not present

## 2016-10-28 DIAGNOSIS — T451X5A Adverse effect of antineoplastic and immunosuppressive drugs, initial encounter: Secondary | ICD-10-CM | POA: Diagnosis not present

## 2016-10-28 DIAGNOSIS — R0902 Hypoxemia: Secondary | ICD-10-CM

## 2016-10-28 DIAGNOSIS — N029 Recurrent and persistent hematuria with unspecified morphologic changes: Secondary | ICD-10-CM | POA: Diagnosis present

## 2016-10-28 DIAGNOSIS — Z6826 Body mass index (BMI) 26.0-26.9, adult: Secondary | ICD-10-CM

## 2016-10-28 DIAGNOSIS — K449 Diaphragmatic hernia without obstruction or gangrene: Secondary | ICD-10-CM | POA: Diagnosis not present

## 2016-10-28 DIAGNOSIS — Z885 Allergy status to narcotic agent status: Secondary | ICD-10-CM

## 2016-10-28 DIAGNOSIS — Z955 Presence of coronary angioplasty implant and graft: Secondary | ICD-10-CM

## 2016-10-28 LAB — CBC WITH DIFFERENTIAL/PLATELET
Basophils Absolute: 0 10*3/uL (ref 0.0–0.1)
Basophils Relative: 0 %
EOS ABS: 0.1 10*3/uL (ref 0.0–0.7)
EOS PCT: 1 %
HCT: 25.5 % — ABNORMAL LOW (ref 36.0–46.0)
HEMOGLOBIN: 8.2 g/dL — AB (ref 12.0–15.0)
LYMPHS PCT: 5 %
Lymphs Abs: 0.4 10*3/uL — ABNORMAL LOW (ref 0.7–4.0)
MCH: 28.3 pg (ref 26.0–34.0)
MCHC: 32.2 g/dL (ref 30.0–36.0)
MCV: 87.9 fL (ref 78.0–100.0)
MONOS PCT: 10 %
Monocytes Absolute: 0.8 10*3/uL (ref 0.1–1.0)
Neutro Abs: 6.6 10*3/uL (ref 1.7–7.7)
Neutrophils Relative %: 84 %
PLATELETS: 285 10*3/uL (ref 150–400)
RBC: 2.9 MIL/uL — ABNORMAL LOW (ref 3.87–5.11)
RDW: 16.3 % — ABNORMAL HIGH (ref 11.5–15.5)
WBC: 7.9 10*3/uL (ref 4.0–10.5)

## 2016-10-28 LAB — URINALYSIS, ROUTINE W REFLEX MICROSCOPIC
Bilirubin Urine: NEGATIVE
Glucose, UA: NEGATIVE mg/dL
Ketones, ur: NEGATIVE mg/dL
Nitrite: NEGATIVE
PROTEIN: 30 mg/dL — AB
Specific Gravity, Urine: 1.004 — ABNORMAL LOW (ref 1.005–1.030)
pH: 5 (ref 5.0–8.0)

## 2016-10-28 LAB — BASIC METABOLIC PANEL
ANION GAP: 10 (ref 5–15)
BUN: 31 mg/dL — ABNORMAL HIGH (ref 6–20)
CO2: 26 mmol/L (ref 22–32)
Calcium: 8.7 mg/dL — ABNORMAL LOW (ref 8.9–10.3)
Chloride: 101 mmol/L (ref 101–111)
Creatinine, Ser: 2.46 mg/dL — ABNORMAL HIGH (ref 0.44–1.00)
GFR calc Af Amer: 21 mL/min — ABNORMAL LOW (ref >60)
GFR, EST NON AFRICAN AMERICAN: 18 mL/min — AB (ref >60)
Glucose, Bld: 101 mg/dL — ABNORMAL HIGH (ref 65–99)
POTASSIUM: 4.1 mmol/L (ref 3.5–5.1)
SODIUM: 137 mmol/L (ref 135–145)

## 2016-10-28 LAB — I-STAT TROPONIN, ED: TROPONIN I, POC: 0.01 ng/mL (ref 0.00–0.08)

## 2016-10-28 MED ORDER — LEVOFLOXACIN IN D5W 500 MG/100ML IV SOLN
500.0000 mg | Freq: Once | INTRAVENOUS | Status: AC
  Administered 2016-10-28: 500 mg via INTRAVENOUS
  Filled 2016-10-28: qty 100

## 2016-10-28 MED ORDER — IPRATROPIUM BROMIDE 0.02 % IN SOLN
0.5000 mg | Freq: Once | RESPIRATORY_TRACT | Status: AC
  Administered 2016-10-28: 0.5 mg via RESPIRATORY_TRACT
  Filled 2016-10-28: qty 2.5

## 2016-10-28 MED ORDER — ALBUTEROL SULFATE (2.5 MG/3ML) 0.083% IN NEBU
5.0000 mg | INHALATION_SOLUTION | Freq: Once | RESPIRATORY_TRACT | Status: AC
  Administered 2016-10-28: 5 mg via RESPIRATORY_TRACT
  Filled 2016-10-28: qty 6

## 2016-10-28 MED ORDER — PREDNISONE 20 MG PO TABS
60.0000 mg | ORAL_TABLET | Freq: Once | ORAL | Status: AC
  Administered 2016-10-28: 60 mg via ORAL
  Filled 2016-10-28: qty 3

## 2016-10-28 MED ORDER — DEXTROSE 5 % IV SOLN
1.0000 g | Freq: Once | INTRAVENOUS | Status: DC
  Filled 2016-10-28: qty 10

## 2016-10-28 MED ORDER — SODIUM CHLORIDE 0.9 % IV SOLN: INTRAVENOUS | Status: DC

## 2016-10-28 MED ORDER — SODIUM CHLORIDE 0.9 % IV SOLN
INTRAVENOUS | Status: DC
Start: 2016-10-28 — End: 2016-10-29
  Administered 2016-10-28: 16:00:00 via INTRAVENOUS

## 2016-10-28 MED ORDER — ENSURE ENLIVE PO LIQD
237.0000 mL | Freq: Two times a day (BID) | ORAL | Status: DC
  Administered 2016-10-29 – 2016-11-01 (×5): 237 mL via ORAL

## 2016-10-28 MED ORDER — DEXTROSE 5 % IV SOLN
500.0000 mg | Freq: Once | INTRAVENOUS | Status: DC
  Filled 2016-10-28: qty 500

## 2016-10-28 NOTE — ED Notes (Signed)
Bed: WA18 Expected date:  Expected time:  Means of arrival:  Comments: 

## 2016-10-28 NOTE — Telephone Encounter (Signed)
Received a message from Anjuli stating that she has decided to go the ED and is wondering if I could call and get her in sooner.  Called her back and asked if she had called her family doctor.  She said she hasn't because she is feeling worse than yesterday with shortness of breath and temperature of 100.9 degrees.  She said she has decided to go to the ED.  Advised her I would try to call but could not guarantee getting her in to be seen right away.  She verbalized agreement.  Called the ED charge nurse, Jana Half, RN who appreciated the call but said they will evaluate her when she arrives and said there is a 4 hours wait.

## 2016-10-28 NOTE — Telephone Encounter (Signed)
Called Renee Vance to see how she is feeling.  She said she had the flu and is still coughing and feeling short of breath "all the time."  She said she has been running temperatures of 100.9 - 99 degrees.  She is taking Tylenol.  She said she is taking a Z pack and has 2 tablets left.  She is worried about laying flat on the CT SIM table because of her breathing.  Advised her to call her family doctor today to advise them about the low grade temperatures and her shortness of breath.  She said she sees Landscape architect, [place] at [facility].  She said she would call them.  Advised her to go to the ER if she starts feeling worse.  Ebelin verbalized understanding and agreement and will call on Monday about having CT SIM on Tuesday.

## 2016-10-28 NOTE — ED Triage Notes (Signed)
Pt c/o SOB and productive cough x 2 weeks and intermittent odorous and possible hematuria x 1 week.  Denies pain.  Pt reports that she was diagnosed w/ the flu x 2 weeks ago.  Hx of lung CA and COPD.  Last chemo/radiiation in Oct 2017.

## 2016-10-28 NOTE — ED Notes (Signed)
Both sets of blood cultures have been sent to main lab.

## 2016-10-28 NOTE — ED Notes (Signed)
Main lab verbalizes will add on BMP on light green tube already in lab.

## 2016-10-28 NOTE — H&P (Signed)
History and Physical    Renee Vance:[id number] DOB: [birth year] DOA: 10/28/2016  PCP: [name] Consultants:  [facility] - Nephrology; [facility] - cardiology; [facility] - oncology; [facility] - rad onc Patient coming from: home - lives with husband; NOK: husband, [phone]  Chief Complaint: SOB  HPI: Renee Vance is a 76 y.o. female with medical history significant of lung cancer (last chemotherapy was in 10/17), hypothyroidism, HLD, HTN, CAD, and COPD presenting because she had the flu, now she has pneumoia.  Breathing is getting bad.  Started feeling bad 2 weeks ago Sunday.  That Tuesday, she went to see PCP, treated with Tamiflu and inhaler.  Symptoms at that time - SOB, cough, pain from coughing, color of urine was wrong, fever to 102.  Symptoms did not really get better but since she has COPD she thought the ongoing SOB was okay.  Things have not been getting better and the breathing has been worse.  Not on home O2, no known hypoxia.  Productive cough - white or yellowish phlegm, no blood.  Bloody drainage with nose blowing.  Fever to 100.9.     ED Course: Per Dr. Maryan Rued: Patient is a 76 year old female with a history of cancer, COPD and last had chemotherapy October 17 was diagnosed with the flu last week and presents today with worsening shortness of breath. Patient is now unable to even take a shower without getting winded and continues to have cough. Temperature today was 99 but she has not had any documented fevers this week. She is taking azithromycin but not improving. I am patient's oxygen saturation is between 90 and 92%. She has been using albuterol at home without significant improvement. On exam generalized decreased breath sounds with some mild crackles at the bases.  EKG without significant findings and low suspicion that this is cardiac in nature. X-ray shows findings suspicious for left-sided perihilar pneumonia in the setting of worsening shortness of breath this  is most likely accurate. Will cover patient with Levaquin and she has almost completely completed a course of azithromycin without improvement. She is community-acquired.  Labs pending and patient given albuterol and Atrovent. She was also placed on 2 L of nasal cannula with significant improvement. BMP with new AKI which is most likely related to dehydation.   Review of Systems: As per HPI; otherwise 10 point review of systems reviewed and negative.   Ambulatory Status:  ambulates without assistance  Past Medical History:  Diagnosis Date  . 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 Bradycardia   . CAD (coronary artery disease)   . COPD (chronic obstructive pulmonary disease) ([place])   . Coronary atherosclerosis of native coronary artery 2011  . Encounter for antineoplastic chemotherapy 06/23/2016  . Fibrocystic breast   . History of radiation therapy 07/05/16 - 08/09/16    Left Lung: 45 Gy in 25 fractions  . HTN (hypertension)   . Hyperlipidemia   . Hypothyroidism   . Obesity   . Pain in joint   . 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 CHOLECYSTECTOMY    . CORONARY ANGIOPLASTY WITH STENT PLACEMENT  2011   [place] 30-40%, LAD 65-75% (FFR 0.88), CFX 55-60%, RCA 95%>0 w/ 2.5 x 12 mm monorail stent  . TUBAL LIGATION    . VIDEO BRONCHOSCOPY Bilateral 06/07/2016   Procedure: VIDEO BRONCHOSCOPY WITH FLUORO;  Surgeon: [name], MD;  Location: [facility];  Service:  Cardiopulmonary;  Laterality: Bilateral;    Social History   Social History  . Marital status: Married    Spouse name: N/A  . Number of children: N/A  . Years of education: N/A   Occupational History  . retired    Social History Main Topics  . Smoking status: Former Smoker    Packs/day: 0.50    Years: 60.00    Quit date: 04/10/2014  . Smokeless tobacco: Never Used     Comment: june 2015  . Alcohol use No  . Drug use:  No  . Sexual activity: Not on file   Other Topics Concern  . Not on file   Social History Narrative  . No narrative on file    Allergies  Allergen Reactions  . Ampicillin Nausea And Vomiting    Has patient had a PCN reaction causing immediate rash, facial/tongue/throat swelling, SOB or lightheadedness with hypotension: no Has patient had a PCN reaction causing severe rash involving mucus membranes or skin necrosis: no Has patient had a PCN reaction that required hospitalization: no Has patient had a PCN reaction occurring within the last 10 years: no If all of the above answers are "NO", then may proceed with Cephalosporin use  . Codeine Nausea And Vomiting    Family History  Problem Relation Age of Onset  . Heart disease Mother     Prior to Admission medications   Medication Sig Start Date End Date Taking? Authorizing Provider  amLODipine (NORVASC) 5 MG tablet Take 5 mg by mouth daily.   Yes Historical Provider, MD  aspirin 81 MG tablet Take 81 mg by mouth daily.   Yes Historical Provider, MD  atorvastatin (LIPITOR) 80 MG tablet Take 80 mg by mouth daily.   Yes Historical Provider, MD  azithromycin (ZITHROMAX) 250 MG tablet Take 250-500 mg by mouth daily. Take '500mg'$  by mouth on day 1, then take '250mg'$  by mouth daily x 4 days. Started 1/15 10/24/16  Yes Historical Provider, MD  dextromethorphan (DELSYM) 30 MG/5ML liquid Take 60 mg by mouth 2 (two) times daily.    Yes Historical Provider, MD  levothyroxine (SYNTHROID, LEVOTHROID) 125 MCG tablet Take 125 mcg by mouth daily before breakfast.  02/26/15  Yes Historical Provider, MD  losartan (COZAAR) 50 MG tablet Take 50 mg by mouth daily. 10/05/16  Yes Historical Provider, MD  metoprolol succinate (TOPROL-XL) 50 MG 24 hr tablet Take 1 tablet (50 mg total) by mouth 2 (two) times daily. 01/06/16  Yes Belva Crome, MD  PROAIR RESPICLICK [id number] Base) MCG/ACT AEPB Take 1-2 puffs by mouth daily as needed for wheezing or shortness of breath.  10/18/16  Yes Historical Provider, MD  tiotropium (SPIRIVA) 18 MCG inhalation capsule Place 18 mcg into inhaler and inhale daily.   Yes Historical Provider, MD  Vitamin D, Cholecalciferol, 1000 UNITS CAPS Take 1,000 Units by mouth daily.  08/25/14  Yes Historical Provider, MD  warfarin (COUMADIN) 5 MG tablet Take 1/2 tablet daily except 1 tablet on Sundays and Thursdays Patient taking differently: Take 2.'5mg'$  Sunday, Tuesday, Thursday. Take '5mg'$  by mouth on Monday, Wednesday, Friday, Saturday 07/28/16  Yes Arnoldo Lenis, MD  prochlorperazine (COMPAZINE) 10 MG tablet Take 1 tablet (10 mg total) by mouth every 6 (six) hours as needed for nausea or vomiting. Patient not taking: Reported on 10/28/2016 06/23/16   [name], MD    Physical Exam: Vitals:   10/28/16 1725 10/28/16 1838 10/28/16 1843 10/28/16 2043  BP: 123/55 (!) 142/53  Marland Kitchen)  168/82  Pulse: 65 84  72  Resp: '20 22  20  '$ Temp: 98.3 F (36.8 C)  98.6 F (37 C) 98.4 F (36.9 C)  TempSrc: Oral  Oral Oral  SpO2: 95% 94%  98%  Weight:    68.9 kg (151 lb 14.4 oz)  Height:    '5\' 4"'$  (1.626 m)     General:  Purse-lipped breathing and tachypnea with minimal movement, some conversational dyspnea Eyes:  PERRL, EOMI, normal lids, iris ENT:  grossly normal hearing, lips & tongue, mmm Neck:  no LAD, masses or thyromegaly Cardiovascular:  RRR, no m/r/g. No LE edema.  Respiratory:  LLL ronchi.  Purse-lipped breathing and tachypnea exacerbated by moving, conversation Abdomen:  soft, ntnd, NABS Skin:  no rash or induration seen on limited exam Musculoskeletal:  grossly normal tone BUE/BLE, good ROM, no bony abnormality Psychiatric:  grossly normal mood and affect, speech fluent and appropriate, AOx3 Neurologic:  CN 2-12 grossly intact, moves all extremities in coordinated fashion, sensation intact  Labs on Admission: I have personally reviewed following labs and imaging studies  CBC:  Recent Labs Lab 10/28/16 1602  WBC 7.9    NEUTROABS 6.6  HGB 8.2*  HCT 25.5*  MCV 87.9  PLT 681   Basic Metabolic Panel:  Recent Labs Lab 10/28/16 1602  NA 137  K 4.1  CL 101  CO2 26  GLUCOSE 101*  BUN 31*  CREATININE 2.46*  CALCIUM 8.7*   GFR: Estimated Creatinine Clearance: 18.3 mL/min (by C-G formula based on SCr of 2.46 mg/dL (H)). Liver Function Tests: No results for input(s): AST, ALT, ALKPHOS, BILITOT, PROT, ALBUMIN in the last 168 hours. No results for input(s): LIPASE, AMYLASE in the last 168 hours. No results for input(s): AMMONIA in the last 168 hours. Coagulation Profile: No results for input(s): INR, PROTIME in the last 168 hours. Cardiac Enzymes: No results for input(s): CKTOTAL, CKMB, CKMBINDEX, TROPONINI in the last 168 hours. BNP (last 3 results) No results for input(s): PROBNP in the last 8760 hours. HbA1C: No results for input(s): HGBA1C in the last 72 hours. CBG: No results for input(s): GLUCAP in the last 168 hours. Lipid Profile: No results for input(s): CHOL, HDL, LDLCALC, TRIG, CHOLHDL, LDLDIRECT in the last 72 hours. Thyroid Function Tests: No results for input(s): TSH, T4TOTAL, FREET4, T3FREE, THYROIDAB in the last 72 hours. Anemia Panel: No results for input(s): VITAMINB12, FOLATE, FERRITIN, TIBC, IRON, RETICCTPCT in the last 72 hours. Urine analysis:    Component Value Date/Time   COLORURINE YELLOW 10/28/2016 1649   APPEARANCEUR CLEAR 10/28/2016 1649   LABSPEC 1.004 (L) 10/28/2016 1649   PHURINE 5.0 10/28/2016 1649   GLUCOSEU NEGATIVE 10/28/2016 1649   HGBUR LARGE (A) 10/28/2016 1649   BILIRUBINUR NEGATIVE 10/28/2016 1649   KETONESUR NEGATIVE 10/28/2016 1649   PROTEINUR 30 (A) 10/28/2016 1649   NITRITE NEGATIVE 10/28/2016 1649   LEUKOCYTESUR TRACE (A) 10/28/2016 1649    Creatinine Clearance: Estimated Creatinine Clearance: 18.3 mL/min (by C-G formula based on SCr of 2.46 mg/dL (H)).  Sepsis Labs: '@LABRCNTIP'$ (procalcitonin:4,lacticidven:4) )No results found for this  or any previous visit (from the past 240 hour(s)).   Radiological Exams on Admission: Dg Chest 2 View  Result Date: 10/28/2016 CLINICAL DATA:  Dyspnea, cough, history of COPD and lung cancer. Ex-smoker. EXAM: CHEST  2 VIEW COMPARISON:  CT from 09/19/2016 and CXR 05/29/2016 FINDINGS: The heart is top-normal in size. There is aortic atherosclerosis without aneurysm. Diffuse interstitial prominence is noted bilaterally left greater than right  with superimposed airspace opacities about the left hilum. Findings may reflect pneumonia superimposed on interstitial lung disease, the interstitial prominence possibly related to bronchitis, post treatment change or fibrosis. Slight volume loss with tenting of left inferior pulmonary ligament. Lesser degree of interstitial markings at the right costophrenic angle and right upper lobe. There appears be a trace left effusion. No pneumothorax. No acute osseous abnormality. IMPRESSION: Findings suspicious for left-sided perihilar pneumonia superimposed on interstitial lung disease. Follow-up therefore recommended to ensure clearance. Followup PA and lateral chest X-ray is recommended in 3-4 weeks following trial of antibiotic therapy to ensure resolution. Trace left effusion. Electronically Signed   By: Ashley Royalty M.D.   On: 10/28/2016 15:40    EKG: Independently reviewed.  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  -Given her relative immunocompromise and recent influenza in conjunction with apparent respiratory distress, will treat with Cefepime and Vancomycin  Will start Levaquin 750 mg IV daily OR Azithromycin 500 mg PO x 1 and then 250 mg daily x 5 days AND Rocephin. - NS @ 125cc/hr - Fever control - Repeat CBC in am - Sputum cultures - Blood cultures -Check for strep pneumoniae - albuterol PRN - Duonebs q6h  Acute renal failure -BUN 31/creatinine 2.46 (prior 1.4 on 09/19/16) -Will aggressively hydrate and continue to follow  Chemotherapy-induced anemia Hgb 8.2 (prior 9.9 on 09/19/16)  Stage IIB non-small cell lung cancer, SCC -Improvement noted following chemotherapy -Currently observing only with plan for repeat CT in March 2831  Hematuria -Uncertain etiology -Will check urine culture -May need urology f/u as an outpatient  Hypothyroidism -Check TSH -Continue Synthroid at current dose for now  Afib on anticoagulation -Coumadin per pharmacy -Rate controlled despite no rate-controlling agents -May need to hold Coumadin if hematuria worsens and/or hemoglobin continues to drop   DVT prophylaxis: Coumadin Code Status:  Full - confirmed with patient/family Family Communication: Husband present throughout evaluation  Disposition Plan:  Home once clinically improved Consults called: None  Admission status: Admit - It is my clinical opinion that admission to [facility] is reasonable and necessary because this patient will require at least 2 midnights in the hospital to treat this condition based on the medical complexity of the problems presented.  Given the aforementioned information, the predictability of an adverse outcome is felt to be significant.     Karmen Bongo MD [facility]  If 7PM-7AM, please contact night-coverage [email or web address] Password [email or web address]  10/29/2016, 1:45 AM

## 2016-10-28 NOTE — ED Provider Notes (Signed)
[facility] DEPT Provider Note   CSN: [id number] Arrival date & time: 10/28/16  1439     History   Chief Complaint Chief Complaint  Patient presents with  . Shortness of Breath  . Weakness    HPI Renee Vance is a 76 y.o. female.  The history is provided by the patient.  Shortness of Breath  This is a new problem. The average episode lasts 2 weeks. The problem occurs continuously.The current episode started more than 1 week ago. The problem has been gradually worsening. Associated symptoms include a fever, rhinorrhea, cough and wheezing. Pertinent negatives include no vomiting, no abdominal pain, no leg pain and no leg swelling. Associated symptoms comments: Dx with flu last week and finish tamiflu but worsening SOB.  Pt has improved fever and no significant productive cough.  She has been using her albuterol at home will improvement. Worsening exertional dyspnea over the last 2 days..  Weakness  Associated symptoms include shortness of breath. Pertinent negatives include no vomiting.    Past Medical History:  Diagnosis Date  . 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 Bradycardia   . CAD (coronary artery disease)   . COPD (chronic obstructive pulmonary disease) ([place])   . Coronary atherosclerosis of native coronary artery 2011  . Dyspnea   . Dyspnea   . Encounter for antineoplastic chemotherapy 06/23/2016  . Fibrocystic breast   . History of radiation therapy 07/05/16 - 08/09/16    Left Lung: 45 Gy in 25 fractions  . HTN (hypertension)   . HTN (hypertension)   . Hyperlipidemia   . Hypothyroidism   . Obesity   . Pain in joint   . 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 Preoperative clearance 10/16/2016  . 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 Encounter for antineoplastic  chemotherapy 06/23/2016  . Lung mass   . Hemoptysis 06/02/2016  . Chronic anticoagulation 08/19/2015  . Encounter for therapeutic drug monitoring 05/04/2015  . PAF (paroxysmal atrial fibrillation) ([place]) 03/26/2015  . Premature atrial contractions 11/11/2014  . Hyperlipidemia   . HTN (hypertension)   . Pain in joint   . Coronary atherosclerosis of native coronary artery   . Dyspnea   . Bradycardia   . Hypothyroidism   . Obesity   . Fibrocystic breast   . COPD (chronic obstructive pulmonary disease) ([place])     Past Surgical History:  Procedure Laterality Date  . CHOLECYSTECTOMY    . CORONARY ANGIOPLASTY WITH STENT PLACEMENT  2011   [place] 30-40%, LAD 65-75% (FFR 0.88), CFX 55-60%, RCA 95%>0 w/ 2.5 x 12 mm monorail stent  . TUBAL LIGATION    . VIDEO BRONCHOSCOPY Bilateral 06/07/2016   Procedure: VIDEO BRONCHOSCOPY WITH FLUORO;  Surgeon: [name], MD;  Location: [facility];  Service: Cardiopulmonary;  Laterality: Bilateral;    OB History    No data available       Home Medications    Prior to Admission medications   Medication Sig Start Date End Date Taking? Authorizing Provider  amLODipine (NORVASC) 5 MG tablet Take 5 mg by mouth daily.    Historical Provider, MD  aspirin 81 MG tablet Take 81 mg by mouth daily.    Historical Provider, MD  atorvastatin (LIPITOR) 80 MG tablet Take 80 mg by mouth daily.    Historical Provider, MD  dextromethorphan (DELSYM) 30 MG/5ML liquid Take 60 mg by mouth.    Historical Provider, MD  levothyroxine (SYNTHROID, LEVOTHROID) 125 MCG tablet Take 125 mcg by mouth daily before breakfast.  02/26/15   Historical Provider, MD  losartan (COZAAR) 100 MG tablet Take 50 mg by mouth daily. 09/29/16   Historical Provider, MD  metoprolol succinate (TOPROL-XL) 50 MG 24 hr tablet Take 1 tablet (50 mg total) by mouth 2 (two) times daily. 01/06/16   Belva Crome, MD  prochlorperazine (COMPAZINE) 10 MG tablet Take 1 tablet (10 mg total) by mouth every 6  (six) hours as needed for nausea or vomiting. 06/23/16   [name], MD  tiotropium (SPIRIVA) 18 MCG inhalation capsule Place 18 mcg into inhaler and inhale daily.    Historical Provider, MD  Vitamin D, Cholecalciferol, 1000 UNITS CAPS Take 1,000 Units by mouth daily.  08/25/14   Historical Provider, MD  warfarin (COUMADIN) 5 MG tablet Take 1/2 tablet daily except 1 tablet on Sundays and Thursdays 07/28/16   Arnoldo Lenis, MD    Family History Family History  Problem Relation Age of Onset  . Heart disease Mother     Social History Social History  Substance Use Topics  . Smoking status: Former Smoker    Packs/day: 0.50    Years: 60.00    Quit date: 04/10/2014  . Smokeless tobacco: Never Used     Comment: june 2015  . Alcohol use No     Allergies   Ampicillin and Codeine   Review of Systems Review of Systems  Constitutional: Positive for fever.  HENT: Positive for rhinorrhea.   Respiratory: Positive for cough, shortness of breath and wheezing.   Cardiovascular: Negative for leg swelling.  Gastrointestinal: Negative for abdominal pain and vomiting.  Neurological: Positive for weakness.     Physical Exam Updated Vital Signs BP 148/89 (BP Location: Left Arm)   Pulse 66   Temp 98 F (36.7 C) (Oral)   Resp 22   Ht '5\' 4"'$  (1.626 m)   Wt 152 lb (68.9 kg)   SpO2 93%   BMI 26.09 kg/m   Physical Exam  Constitutional: She is oriented to person, place, and time. She appears well-developed and well-nourished. No distress.  HENT:  Head: Normocephalic and atraumatic.  Mouth/Throat: Oropharynx is clear and moist. Mucous membranes are dry.  Eyes: Conjunctivae and EOM are normal. Pupils are equal, round, and reactive to light.  Neck: Normal range of motion. Neck supple.  Cardiovascular: Normal rate, regular rhythm and intact distal pulses.   No murmur heard. Pulmonary/Chest: Effort normal. Tachypnea noted. No respiratory distress. She has decreased breath sounds. She  has no wheezes. She has rales in the right lower field and the left lower field.  Purse lip breathing  Abdominal: Soft. She exhibits no distension. There is no tenderness. There is no rebound and no guarding.  Musculoskeletal: Normal range of motion. She exhibits no edema or tenderness.  Neurological: She is alert and oriented to person, place, and time.  Skin: Skin is warm and dry. No rash noted. No erythema.  Psychiatric: She has a normal mood and affect. Her behavior is normal.  Nursing note and vitals reviewed.    ED Treatments / Results  Labs (all labs ordered are listed, but only abnormal results are displayed) Labs Reviewed  CBC WITH DIFFERENTIAL/PLATELET - Abnormal; Notable for the following:       Result Value   RBC 2.90 (*)    Hemoglobin 8.2 (*)  HCT 25.5 (*)    RDW 16.3 (*)    Lymphs Abs 0.4 (*)    All other components within normal limits  URINALYSIS, ROUTINE W REFLEX MICROSCOPIC - Abnormal; Notable for the following:    Specific Gravity, Urine 1.004 (*)    Hgb urine dipstick LARGE (*)    Protein, ur 30 (*)    Leukocytes, UA TRACE (*)    Bacteria, UA FEW (*)    Squamous Epithelial / LPF 0-5 (*)    All other components within normal limits  BASIC METABOLIC PANEL - Abnormal; Notable for the following:    Glucose, Bld 101 (*)    BUN 31 (*)    Creatinine, Ser 2.46 (*)    Calcium 8.7 (*)    GFR calc non Af Amer 18 (*)    GFR calc Af Amer 21 (*)    All other components within normal limits  I-STAT CHEM 8, ED  I-STAT TROPOININ, ED    EKG  EKG Interpretation  Date/Time:  Friday October 28 2016 14:55:47 EST Ventricular Rate:  68 PR Interval:    QRS Duration: 94 QT Interval:  372 QTC Calculation: 396 R Axis:   68 Text Interpretation:  Sinus rhythm No significant change since last tracing Confirmed by Maryan Rued  MD, Loree Fee ([id number]) on 10/28/2016 3:41:53 PM       Radiology Dg Chest 2 View  Result Date: 10/28/2016 CLINICAL DATA:  Dyspnea, cough, history of  COPD and lung cancer. Ex-smoker. EXAM: CHEST  2 VIEW COMPARISON:  CT from 09/19/2016 and CXR 05/29/2016 FINDINGS: The heart is top-normal in size. There is aortic atherosclerosis without aneurysm. Diffuse interstitial prominence is noted bilaterally left greater than right with superimposed airspace opacities about the left hilum. Findings may reflect pneumonia superimposed on interstitial lung disease, the interstitial prominence possibly related to bronchitis, post treatment change or fibrosis. Slight volume loss with tenting of left inferior pulmonary ligament. Lesser degree of interstitial markings at the right costophrenic angle and right upper lobe. There appears be a trace left effusion. No pneumothorax. No acute osseous abnormality. IMPRESSION: Findings suspicious for left-sided perihilar pneumonia superimposed on interstitial lung disease. Follow-up therefore recommended to ensure clearance. Followup PA and lateral chest X-ray is recommended in 3-4 weeks following trial of antibiotic therapy to ensure resolution. Trace left effusion. Electronically Signed   By: Ashley Royalty M.D.   On: 10/28/2016 15:40    Procedures Procedures (including critical care time)  Medications Ordered in ED Medications  0.9 %  sodium chloride infusion (not administered)  albuterol (PROVENTIL) (2.5 MG/3ML) 0.083% nebulizer solution 5 mg (not administered)  ipratropium (ATROVENT) nebulizer solution 0.5 mg (not administered)  levofloxacin (LEVAQUIN) IVPB 500 mg (not administered)     Initial Impression / Assessment and Plan / ED Course  I have reviewed the triage vital signs and the nursing notes.  Pertinent labs & imaging results that were available during my care of the patient were reviewed by me and considered in my medical decision making (see chart for details).    Patient is a 76 year old female with a history of cancer, COPD and last had chemotherapy October 17 was diagnosed with the flu last week and  presents today with worsening shortness of breath. Patient is now unable to even take a shower without getting winded and continues to have cough. Temperature today was 99 but she has not had any documented fevers this week. She is taking azithromycin but not improving. I am patient's oxygen saturation is between  90 and 92%.  She has been using albuterol at home without significant improvement. On exam generalized decreased breath sounds with some mild crackles at the bases. EKG without significant findings and low suspicion that this is cardiac in nature. X-ray shows findings suspicious for left-sided perihilar pneumonia in the setting of worsening shortness of breath this is most likely accurate. Will cover patient with Levaquin and she has almost completely completed a course of azithromycin without improvement. She is community-acquired. Labs pending and patient given albuterol and Atrovent. She was also placed on 2 L of nasal cannula with significant improvement.  BMP with new AKI which is most likely related to dehydation.   Final Clinical Impressions(s) / ED Diagnoses   Final diagnoses:  COPD exacerbation ([place])  Community acquired pneumonia of left lung, unspecified part of lung  AKI (acute kidney injury) ([place])    New Prescriptions New Prescriptions   No medications on file     Blanchie Dessert, MD 10/29/16 0041

## 2016-10-28 NOTE — Progress Notes (Signed)
Oncology Nurse Navigator Documentation  Oncology Nurse Navigator Flowsheets 10/28/2016  Navigator Location [facility]  Navigator Encounter Type Other/ I noticed Ms. Demeyer was in the ED today.  I went to check on her.  She is having SOB and states she has been dealing with flu like symptoms.  She was concerned about follow up appt. I stated don't worry. I will check on Monday to set up follow up if needed.   Patient Visit Type Inpatient  Interventions Other  Acuity Level 1  Time Spent with Patient 45

## 2016-10-29 ENCOUNTER — Inpatient Hospital Stay (HOSPITAL_COMMUNITY): Payer: PPO

## 2016-10-29 DIAGNOSIS — J181 Lobar pneumonia, unspecified organism: Secondary | ICD-10-CM

## 2016-10-29 DIAGNOSIS — D62 Acute posthemorrhagic anemia: Secondary | ICD-10-CM

## 2016-10-29 DIAGNOSIS — J9611 Chronic respiratory failure with hypoxia: Secondary | ICD-10-CM | POA: Diagnosis present

## 2016-10-29 DIAGNOSIS — R0902 Hypoxemia: Secondary | ICD-10-CM

## 2016-10-29 DIAGNOSIS — I4891 Unspecified atrial fibrillation: Secondary | ICD-10-CM

## 2016-10-29 DIAGNOSIS — C3490 Malignant neoplasm of unspecified part of unspecified bronchus or lung: Secondary | ICD-10-CM

## 2016-10-29 DIAGNOSIS — N183 Chronic kidney disease, stage 3 (moderate): Secondary | ICD-10-CM

## 2016-10-29 LAB — CBC WITH DIFFERENTIAL/PLATELET
Basophils Absolute: 0 10*3/uL (ref 0.0–0.1)
Basophils Relative: 0 %
EOS ABS: 0 10*3/uL (ref 0.0–0.7)
EOS PCT: 0 %
HCT: 24.9 % — ABNORMAL LOW (ref 36.0–46.0)
HEMOGLOBIN: 8.1 g/dL — AB (ref 12.0–15.0)
LYMPHS ABS: 0.2 10*3/uL — AB (ref 0.7–4.0)
Lymphocytes Relative: 4 %
MCH: 29.1 pg (ref 26.0–34.0)
MCHC: 32.5 g/dL (ref 30.0–36.0)
MCV: 89.6 fL (ref 78.0–100.0)
MONOS PCT: 3 %
Monocytes Absolute: 0.1 10*3/uL (ref 0.1–1.0)
Neutro Abs: 4.8 10*3/uL (ref 1.7–7.7)
Neutrophils Relative %: 93 %
PLATELETS: 288 10*3/uL (ref 150–400)
RBC: 2.78 MIL/uL — ABNORMAL LOW (ref 3.87–5.11)
RDW: 16.5 % — ABNORMAL HIGH (ref 11.5–15.5)
WBC: 5.1 10*3/uL (ref 4.0–10.5)

## 2016-10-29 LAB — RESPIRATORY PANEL BY PCR
ADENOVIRUS-RVPPCR: NOT DETECTED
BORDETELLA PERTUSSIS-RVPCR: NOT DETECTED
CHLAMYDOPHILA PNEUMONIAE-RVPPCR: NOT DETECTED
CORONAVIRUS 229E-RVPPCR: NOT DETECTED
CORONAVIRUS HKU1-RVPPCR: NOT DETECTED
Coronavirus NL63: NOT DETECTED
Coronavirus OC43: NOT DETECTED
Influenza A: NOT DETECTED
Influenza B: NOT DETECTED
MYCOPLASMA PNEUMONIAE-RVPPCR: NOT DETECTED
Metapneumovirus: NOT DETECTED
PARAINFLUENZA VIRUS 3-RVPPCR: NOT DETECTED
Parainfluenza Virus 1: NOT DETECTED
Parainfluenza Virus 2: NOT DETECTED
Parainfluenza Virus 4: NOT DETECTED
RHINOVIRUS / ENTEROVIRUS - RVPPCR: NOT DETECTED
Respiratory Syncytial Virus: NOT DETECTED

## 2016-10-29 LAB — STREP PNEUMONIAE URINARY ANTIGEN: Strep Pneumo Urinary Antigen: NEGATIVE

## 2016-10-29 LAB — BASIC METABOLIC PANEL
Anion gap: 9 (ref 5–15)
BUN: 34 mg/dL — AB (ref 6–20)
CHLORIDE: 103 mmol/L (ref 101–111)
CO2: 25 mmol/L (ref 22–32)
CREATININE: 2.59 mg/dL — AB (ref 0.44–1.00)
Calcium: 8.5 mg/dL — ABNORMAL LOW (ref 8.9–10.3)
GFR calc Af Amer: 19 mL/min — ABNORMAL LOW (ref >60)
GFR calc non Af Amer: 17 mL/min — ABNORMAL LOW (ref >60)
GLUCOSE: 126 mg/dL — AB (ref 65–99)
Potassium: 4.5 mmol/L (ref 3.5–5.1)
Sodium: 137 mmol/L (ref 135–145)

## 2016-10-29 LAB — TSH: TSH: 0.09 u[IU]/mL — AB (ref 0.350–4.500)

## 2016-10-29 LAB — EXPECTORATED SPUTUM ASSESSMENT W REFEX TO RESP CULTURE: SPECIAL REQUESTS: NORMAL

## 2016-10-29 LAB — CK: CK TOTAL: 114 U/L (ref 38–234)

## 2016-10-29 LAB — ABO/RH: ABO/RH(D): B NEG

## 2016-10-29 LAB — EXPECTORATED SPUTUM ASSESSMENT W GRAM STAIN, RFLX TO RESP C

## 2016-10-29 LAB — PROTIME-INR
INR: 4.5
PROTHROMBIN TIME: 44 s — AB (ref 11.4–15.2)

## 2016-10-29 LAB — MRSA PCR SCREENING: MRSA BY PCR: NEGATIVE

## 2016-10-29 MED ORDER — VANCOMYCIN HCL IN DEXTROSE 1-5 GM/200ML-% IV SOLN
1000.0000 mg | INTRAVENOUS | Status: DC
  Administered 2016-10-29: 1000 mg via INTRAVENOUS
  Filled 2016-10-29: qty 200

## 2016-10-29 MED ORDER — SODIUM CHLORIDE 0.9 % IV SOLN: INTRAVENOUS | Status: DC

## 2016-10-29 MED ORDER — DEXTROMETHORPHAN POLISTIREX ER 30 MG/5ML PO SUER
60.0000 mg | Freq: Two times a day (BID) | ORAL | Status: DC
  Administered 2016-10-29 – 2016-11-04 (×13): 60 mg via ORAL
  Filled 2016-10-29 (×14): qty 10

## 2016-10-29 MED ORDER — SODIUM CHLORIDE 0.9 % IV SOLN
INTRAVENOUS | Status: AC
  Administered 2016-10-29: 14:00:00 via INTRAVENOUS

## 2016-10-29 MED ORDER — LEVOTHYROXINE SODIUM 25 MCG PO TABS
125.0000 ug | ORAL_TABLET | Freq: Every day | ORAL | Status: DC
  Administered 2016-10-29 – 2016-11-04 (×7): 125 ug via ORAL
  Filled 2016-10-29 (×7): qty 1

## 2016-10-29 MED ORDER — METOPROLOL SUCCINATE ER 50 MG PO TB24
50.0000 mg | ORAL_TABLET | Freq: Two times a day (BID) | ORAL | Status: DC
  Administered 2016-10-29 – 2016-11-04 (×13): 50 mg via ORAL
  Filled 2016-10-29 (×2): qty 2
  Filled 2016-10-29 (×7): qty 1
  Filled 2016-10-29 (×4): qty 2

## 2016-10-29 MED ORDER — ATORVASTATIN CALCIUM 40 MG PO TABS
80.0000 mg | ORAL_TABLET | Freq: Every day | ORAL | Status: DC
  Administered 2016-10-29 – 2016-11-04 (×7): 80 mg via ORAL
  Filled 2016-10-29 (×7): qty 2

## 2016-10-29 MED ORDER — IPRATROPIUM-ALBUTEROL 0.5-2.5 (3) MG/3ML IN SOLN
3.0000 mL | Freq: Four times a day (QID) | RESPIRATORY_TRACT | Status: DC
  Administered 2016-10-29 (×3): 3 mL via RESPIRATORY_TRACT
  Filled 2016-10-29 (×3): qty 3

## 2016-10-29 MED ORDER — IPRATROPIUM-ALBUTEROL 0.5-2.5 (3) MG/3ML IN SOLN
3.0000 mL | Freq: Three times a day (TID) | RESPIRATORY_TRACT | Status: DC
  Administered 2016-10-30 (×2): 3 mL via RESPIRATORY_TRACT
  Filled 2016-10-29 (×2): qty 3

## 2016-10-29 MED ORDER — DEXTROSE 5 % IV SOLN
1.0000 g | Freq: Every day | INTRAVENOUS | Status: DC
  Administered 2016-10-29 – 2016-10-31 (×3): 1 g via INTRAVENOUS
  Filled 2016-10-29 (×4): qty 1

## 2016-10-29 MED ORDER — ALBUTEROL SULFATE (2.5 MG/3ML) 0.083% IN NEBU
2.5000 mg | INHALATION_SOLUTION | RESPIRATORY_TRACT | Status: DC | PRN
  Administered 2016-10-30: 2.5 mg via RESPIRATORY_TRACT
  Filled 2016-10-29: qty 3

## 2016-10-29 MED ORDER — AMLODIPINE BESYLATE 5 MG PO TABS
5.0000 mg | ORAL_TABLET | Freq: Every day | ORAL | Status: DC
Start: 2016-10-29 — End: 2016-10-29

## 2016-10-29 MED ORDER — SODIUM CHLORIDE 0.9 % IV SOLN
Freq: Once | INTRAVENOUS | Status: AC
  Administered 2016-10-29: 15:00:00 via INTRAVENOUS

## 2016-10-29 MED ORDER — WARFARIN - PHARMACIST DOSING INPATIENT: Freq: Every day | Status: DC

## 2016-10-29 MED ORDER — ASPIRIN EC 81 MG PO TBEC: 81.0000 mg | DELAYED_RELEASE_TABLET | Freq: Every day | ORAL | Status: DC

## 2016-10-29 MED ORDER — DEXTROSE 5 % IV SOLN
1.0000 g | Freq: Once | INTRAVENOUS | Status: AC
  Administered 2016-10-29: 1 g via INTRAVENOUS
  Filled 2016-10-29: qty 1

## 2016-10-29 NOTE — Progress Notes (Signed)
PROGRESS NOTE  Renee Vance RXV:[id number] DOB: April 25, 1941 DOA: 10/28/2016 PCP: [name]  Brief History:  76 year old female with a history of stage II squamous cell carcinoma of the lung, hypertension, hyperlipidemia, COPD, coronary artery disease presenting with 2 week history of coughing, shortness of breath, and generalized weakness. The patient went to see her primary care provider on 10/18/2016. Apparently, the patient was diagnosed with influenza and started on oseltamivir. She did not have significant improvement and continued to have shortness of breath and coughing with yellow sputum. Her shortness of breath has progressed to the point of having dyspnea with minimal exertion. As a result presented to the hospital for further evaluation. The patient called her primary care provider's office back, and was started on azithromycin on 10/26/2016. She has had low-grade fevers at home running between 99.0-100.24F. She denies any nausea, vomiting, diarrhea, abdominal pain, dysuria. There is been no hemoptysis. However, the patient has had gross hematuria for the past 2-3 days. She states that she has had intermittent hematuria since the beginning of December 2017. She denies any over-the-counter NSAIDs. Upon presentation to the emergency department, the patient was noted to have serum creatinine 2.46 and hemoglobin 8.2. INR was 4.50. Urinalysis showed gross and microscopic hematuria.  Assessment/Plan: Acute respiratory failure with hypoxia -Secondary to pneumonia -Presently stable on 2 L nasal cannula -Pulmonary hygiene -Continue antibiotics -Continue bronchodilators  HCAP -Given her relative immunosuppressed state, will treat pt as HCAP -continue vancomycin and cefepime pending culture data -MRSA screen -viral respiratory panel -10/28/2016 chest x-ray--left perihilar infiltrate; trace left effusion  Acute blood loss anemia -Hemoglobin baseline approx  10 -presenting Hgb 8.2 -d/c coumadin for now  Hematuria -consulted urology -cannot give contrast due to acute on chronic renal failure -pt had microscopic hematuria dating back to [facility] on 01/20/15  Acute on chronic renal failure--CKD 3 -renal US -continue IVF x 12 more hours -may need renal consult if no improvement -check CK -d/c losartan  Coagulopathy -due to coumadin -one unit FFP in setting of ABLA  Paroxysmal atrial fibrillation -Hold coumadin due to hematuria and ABLA -CHADS-VASc = 5 -presently in sinus  HTN -d/c amlodipine -continue metoprolol succinate  COPD -stable without wheezing -continue bronchodilators  Squamous cell carcinoma Lung -last chemo 08/01/16 -follows Dr. Julien Nordmann -wants to defer LUL lobectomy  Hyperlipidemia -continue statin     Disposition Plan:   Home in 2-3 days  Family Communication:   No Family at bedside--Total time spent 35 minutes.  Greater than 50% spent face to face counseling and coordinating care.   Consultants: urology   Code Status:  FULL  DVT Prophylaxis:  coumadin   Procedures: As Listed in Progress Note Above  Antibiotics: vanco 10/28/16>>> Cefepime 10/28/16>>>    Subjective: Patient is been low because yesterday patient continues to hematuria. She continues to have nonproductive cough without hemoptysis. She denies any fevers, chills, nausea, vomiting, diarrhea, abdominal pain. No dysuria. Denies any headache or neck pain. She still has significant dyspnea with exertion.  Objective: Vitals:   10/28/16 1838 10/28/16 1843 10/28/16 2043 10/29/16 0504  BP: (!) 142/53  (!) 168/82 128/68  Pulse: 84  72 86  Resp: '22  20 18  '$ Temp:  98.6 F (37 C) 98.4 F (36.9 C) 98 F (36.7 C)  TempSrc:  Oral Oral Oral  SpO2: 94%  98% 98%  Weight:   68.9 kg (151 lb 14.4 oz)   Height:   '5\' 4"'$  (1.626  m)     Intake/Output Summary (Last 24 hours) at 10/29/16 0912 Last data filed at 10/29/16 7062  Gross per 24 hour  Intake               940 ml  Output              700 ml  Net              240 ml   Weight change:  Exam:   General:  Pt is alert, follows commands appropriately, not in acute distress  HEENT: No icterus, No thrush, No neck mass, [place]/AT  Cardiovascular: RRR, S1/S2, no rubs, no gallops  Respiratory: Bibasilar crackles, left greater than right. No wheezing. Good air movement.  Abdomen: Soft/+BS, non tender, non distended, no guarding  Extremities: No edema, No lymphangitis, No petechiae, No rashes, no synovitis   Data Reviewed: I have personally reviewed following labs and imaging studies Basic Metabolic Panel:  Recent Labs Lab 10/28/16 1602 10/29/16 0359  NA 137 137  K 4.1 4.5  CL 101 103  CO2 26 25  GLUCOSE 101* 126*  BUN 31* 34*  CREATININE 2.46* 2.59*  CALCIUM 8.7* 8.5*   Liver Function Tests: No results for input(s): AST, ALT, ALKPHOS, BILITOT, PROT, ALBUMIN in the last 168 hours. No results for input(s): LIPASE, AMYLASE in the last 168 hours. No results for input(s): AMMONIA in the last 168 hours. Coagulation Profile:  Recent Labs Lab 10/29/16 0702  INR 4.50*   CBC:  Recent Labs Lab 10/28/16 1602 10/29/16 0359  WBC 7.9 5.1  NEUTROABS 6.6 4.8  HGB 8.2* 8.1*  HCT 25.5* 24.9*  MCV 87.9 89.6  PLT 285 288   Cardiac Enzymes: No results for input(s): CKTOTAL, CKMB, CKMBINDEX, TROPONINI in the last 168 hours. BNP: Invalid input(s): POCBNP CBG: No results for input(s): GLUCAP in the last 168 hours. HbA1C: No results for input(s): HGBA1C in the last 72 hours. Urine analysis:    Component Value Date/Time   COLORURINE YELLOW 10/28/2016 1649   APPEARANCEUR CLEAR 10/28/2016 1649   LABSPEC 1.004 (L) 10/28/2016 1649   PHURINE 5.0 10/28/2016 1649   GLUCOSEU NEGATIVE 10/28/2016 1649   HGBUR LARGE (A) 10/28/2016 1649   BILIRUBINUR NEGATIVE 10/28/2016 1649   KETONESUR NEGATIVE 10/28/2016 1649   PROTEINUR 30 (A) 10/28/2016 1649   NITRITE NEGATIVE 10/28/2016 1649    LEUKOCYTESUR TRACE (A) 10/28/2016 1649   Sepsis Labs: '@LABRCNTIP'$ (procalcitonin:4,lacticidven:4) )No results found for this or any previous visit (from the past 240 hour(s)).   Scheduled Meds: . sodium chloride   Intravenous Once  . amLODipine  5 mg Oral Daily  . aspirin EC  81 mg Oral Daily  . atorvastatin  80 mg Oral Daily  . ceFEPime (MAXIPIME) IV  1 g Intravenous QHS  . dextromethorphan  60 mg Oral BID  . feeding supplement (ENSURE ENLIVE)  237 mL Oral BID BM  . ipratropium-albuterol  3 mL Nebulization Q6H  . levothyroxine  125 mcg Oral QAC breakfast  . metoprolol succinate  50 mg Oral BID  . vancomycin  1,000 mg Intravenous Q48H  . Warfarin - Pharmacist Dosing Inpatient   Does not apply q1800   Continuous Infusions: . sodium chloride 125 mL/hr at 10/28/16 1602    Procedures/Studies: Dg Chest 2 View  Result Date: 10/28/2016 CLINICAL DATA:  Dyspnea, cough, history of COPD and lung cancer. Ex-smoker. EXAM: CHEST  2 VIEW COMPARISON:  CT from 09/19/2016 and CXR 05/29/2016 FINDINGS: The heart is top-normal in  size. There is aortic atherosclerosis without aneurysm. Diffuse interstitial prominence is noted bilaterally left greater than right with superimposed airspace opacities about the left hilum. Findings may reflect pneumonia superimposed on interstitial lung disease, the interstitial prominence possibly related to bronchitis, post treatment change or fibrosis. Slight volume loss with tenting of left inferior pulmonary ligament. Lesser degree of interstitial markings at the right costophrenic angle and right upper lobe. There appears be a trace left effusion. No pneumothorax. No acute osseous abnormality. IMPRESSION: Findings suspicious for left-sided perihilar pneumonia superimposed on interstitial lung disease. Follow-up therefore recommended to ensure clearance. Followup PA and lateral chest X-ray is recommended in 3-4 weeks following trial of antibiotic therapy to ensure resolution.  Trace left effusion. Electronically Signed   By: Ashley Royalty M.D.   On: 10/28/2016 15:40    Kenric Ginger, DO  [facility] Pager [phone]  If 7PM-7AM, please contact night-coverage [email or web address] Password [email or web address] 10/29/2016, 9:12 AM   LOS: 1 day

## 2016-10-29 NOTE — Progress Notes (Signed)
ANTICOAGULATION CONSULT NOTE - Initial Consult  Pharmacy Consult for Warfarin Indication: atrial fibrillation  Allergies  Allergen Reactions  . Ampicillin Nausea And Vomiting    Has patient had a PCN reaction causing immediate rash, facial/tongue/throat swelling, SOB or lightheadedness with hypotension: no Has patient had a PCN reaction causing severe rash involving mucus membranes or skin necrosis: no Has patient had a PCN reaction that required hospitalization: no Has patient had a PCN reaction occurring within the last 10 years: no If all of the above answers are "NO", then may proceed with Cephalosporin use  . Codeine Nausea And Vomiting    Patient Measurements: Height: '5\' 4"'$  (162.6 cm) Weight: 151 lb 14.4 oz (68.9 kg) IBW/kg (Calculated) : 54.7 Heparin Dosing Weight:   Vital Signs: Temp: 98.4 F (36.9 C) (01/19 2043) Temp Source: Oral (01/19 2043) BP: 168/82 (01/19 2043) Pulse Rate: 72 (01/19 2043)  Labs:  Recent Labs  10/28/16 1602  HGB 8.2*  HCT 25.5*  PLT 285  CREATININE 2.46*    Estimated Creatinine Clearance: 18.3 mL/min (by C-G formula based on SCr of 2.46 mg/dL (H)).   Medical History: Past Medical History:  Diagnosis Date  . 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 Bradycardia   . CAD (coronary artery disease)   . COPD (chronic obstructive pulmonary disease) ([place])   . Coronary atherosclerosis of native coronary artery 2011  . Encounter for antineoplastic chemotherapy 06/23/2016  . Fibrocystic breast   . History of radiation therapy 07/05/16 - 08/09/16    Left Lung: 45 Gy in 25 fractions  . HTN (hypertension)   . Hyperlipidemia   . Hypothyroidism   . Obesity   . Pain in joint   . 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 amLODipine (NORVASC) 5 MG tablet Take 5 mg by mouth daily.   10/28/2016 at Unknown time  .  aspirin 81 MG tablet Take 81 mg by mouth daily.   10/28/2016 at Unknown time  . atorvastatin (LIPITOR) 80 MG tablet Take 80 mg by mouth daily.   10/28/2016 at Unknown time  . dextromethorphan (DELSYM) 30 MG/5ML liquid Take 60 mg by mouth 2 (two) times daily.    10/28/2016 at Unknown time  . levothyroxine (SYNTHROID, LEVOTHROID) 125 MCG tablet Take 125 mcg by mouth daily before breakfast.    10/28/2016 at Unknown time  . losartan (COZAAR) 50 MG tablet Take 50 mg by mouth daily.   10/28/2016 at Unknown time  . metoprolol succinate (TOPROL-XL) 50 MG 24 hr tablet Take 1 tablet (50 mg total) by mouth 2 (two) times daily. 180 tablet 1 10/28/2016 at 1100  . PROAIR RESPICLICK 469 (90 Base) MCG/ACT AEPB Take 1-2 puffs by mouth daily as needed for wheezing or shortness of breath.   10/28/2016 at Unknown time  . tiotropium (SPIRIVA) 18 MCG inhalation capsule Place 18 mcg into inhaler and inhale daily.   unknown  . Vitamin D, Cholecalciferol, 1000 UNITS CAPS Take 1,000 Units by mouth daily.    10/28/2016 at Unknown time  . warfarin (COUMADIN) 5 MG tablet Take 1/2 tablet daily except 1 tablet on Sundays and Thursdays (Patient taking differently: Take 2.'5mg'$  Sunday, Tuesday, Thursday. Take '5mg'$  by mouth on Monday, Wednesday, Friday, Saturday) 90 tablet 3 10/28/2016 at 1100  . [DISCONTINUED] azithromycin (ZITHROMAX) 250 MG tablet Take 250-500 mg by mouth daily. Take '500mg'$  by mouth on day 1,  then take '250mg'$  by mouth daily x 4 days. Started 1/15   10/28/2016 at Unknown time  . [DISCONTINUED] prochlorperazine (COMPAZINE) 10 MG tablet Take 1 tablet (10 mg total) by mouth every 6 (six) hours as needed for nausea or vomiting. (Patient not taking: Reported on 10/28/2016) 30 tablet 0 Not Taking at Unknown time    Assessment: Patient with afib and chronic warfarin.  INR ordered.  Patient taking 2.'5mg'$  daily while on zpak, per med rec.   Goal of Therapy:  INR 2-3    Plan:  Follow up with INR and dose warfarin if needed. Daily  INR  Nani Skillern Crowford 10/29/2016,4:12 AM

## 2016-10-29 NOTE — Progress Notes (Addendum)
CRITICAL VALUE ALERT  Critical value received:  INR 4.5  Date of notification:  10/29/2016  Time of notification:  0738  Critical value read back:Yes.    Nurse who received alert:  Ross Hefferan, Jaynie Bream  MD notified (1st page):  Dr. Carles Collet on floor, notified  Time of first page:  [phone]  MD notified (2nd page):  Time of second page:  Responding MD:  Dr. Jeanella Flattery new orders given  Time MD responded:  [phone]

## 2016-10-29 NOTE — Progress Notes (Signed)
Pharmacy Antibiotic Note  AIXA CORSELLO is a 76 y.o. female admitted on 10/28/2016 with pneumonia.  Pharmacy has been consulted for Vancomycin, cefepime  dosing.  Plan: Vancomycin 1gm IV every 48 hours.  Goal trough 15-20 mcg/mL.  Height: '5\' 4"'$  (162.6 cm) Weight: 151 lb 14.4 oz (68.9 kg) IBW/kg (Calculated) : 54.7  Temp (24hrs), Avg:98.3 F (36.8 C), Min:98 F (36.7 C), Max:98.6 F (37 C)   Recent Labs Lab 10/28/16 1602  WBC 7.9  CREATININE 2.46*    Estimated Creatinine Clearance: 18.3 mL/min (by C-G formula based on SCr of 2.46 mg/dL (H)).    Allergies  Allergen Reactions  . Ampicillin Nausea And Vomiting    Has patient had a PCN reaction causing immediate rash, facial/tongue/throat swelling, SOB or lightheadedness with hypotension: no Has patient had a PCN reaction causing severe rash involving mucus membranes or skin necrosis: no Has patient had a PCN reaction that required hospitalization: no Has patient had a PCN reaction occurring within the last 10 years: no If all of the above answers are "NO", then may proceed with Cephalosporin use  . Codeine Nausea And Vomiting    Antimicrobials this admission: Vancomycin 10/29/2016 >> Cefepime 10/29/2016 >>   Dose adjustments this admission: -  Microbiology results: pending  Thank you for allowing pharmacy to be a part of this patient's care.  Nani Skillern Crowford 10/29/2016 4:14 AM

## 2016-10-29 NOTE — Progress Notes (Signed)
Blood bank called, no blood type lab on file, order placed to be drawn per protocol for plasma administration.

## 2016-10-29 NOTE — Consult Note (Signed)
Urology Consult   Physician requesting consult: Dr. Carles Collet  Reason for consult: gross hematuria  History of Present Illness: Renee Vance is a 76 y.o. with history of lung cancer, COPD who was admitted with fever and likely pneumonia yesterday. She was noted to have anemia on admission with hemoglobin of 8.2 from 9.9 on 09/19/16. Additionally, she was found to have elevated creatinine of 2.6 from baseline of 1.4.   She is currently on chemotherapy for her lung cancer. She has had a few episodes of mild gross hematuria since December (tea or pink tinged urine) and has had microscopic hematuria in the past at [facility]. She has never had dark red urine or clots. Yesterday was the most persistent hematuria she had when her INR was 4.5. UA showed microscopic hematuria on admission, equivocal for UTI. Urine culture is pending.  Renal ultrasound today showed mild fullness of right renal pelvis, right renal mass, evidence of chronic kidney disease, and no left hydronephrosis.  She denies a history of voiding symptoms, hematuria, UTIs, urolithiasis, GU malignancy/trauma/surgery.  Past Medical History:  Diagnosis Date  . 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 Bradycardia   . CAD (coronary artery disease)   . COPD (chronic obstructive pulmonary disease) ([place])   . Coronary atherosclerosis of native coronary artery 2011  . Encounter for antineoplastic chemotherapy 06/23/2016  . Fibrocystic breast   . History of radiation therapy 07/05/16 - 08/09/16    Left Lung: 45 Gy in 25 fractions  . HTN (hypertension)   . Hyperlipidemia   . Hypothyroidism   . Obesity   . Pain in joint   . 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 CHOLECYSTECTOMY    . CORONARY ANGIOPLASTY WITH STENT PLACEMENT  2011   [place] 30-40%, LAD 65-75% (FFR 0.88), CFX 55-60%, RCA 95%>0 w/ 2.5 x 12 mm monorail stent  . TUBAL LIGATION    .  VIDEO BRONCHOSCOPY Bilateral 06/07/2016   Procedure: VIDEO BRONCHOSCOPY WITH FLUORO;  Surgeon: [name], MD;  Location: [facility];  Service: Cardiopulmonary;  Laterality: Bilateral;     Current Hospital Medications:  Scheduled Meds: . sodium chloride   Intravenous Once  . atorvastatin  80 mg Oral Daily  . ceFEPime (MAXIPIME) IV  1 g Intravenous QHS  . dextromethorphan  60 mg Oral BID  . feeding supplement (ENSURE ENLIVE)  237 mL Oral BID BM  . ipratropium-albuterol  3 mL Nebulization Q6H  . levothyroxine  125 mcg Oral QAC breakfast  . metoprolol succinate  50 mg Oral BID  . vancomycin  1,000 mg Intravenous Q48H   Continuous Infusions: PRN Meds:.albuterol  Allergies:  Allergies  Allergen Reactions  . Ampicillin Nausea And Vomiting    Has patient had a PCN reaction causing immediate rash, facial/tongue/throat swelling, SOB or lightheadedness with hypotension: no Has patient had a PCN reaction causing severe rash involving mucus membranes or skin necrosis: no Has patient had a PCN reaction that required hospitalization: no Has patient had a PCN reaction occurring within the last 10 years: no If all of the above answers are "NO", then may proceed with Cephalosporin use  . Codeine Nausea And Vomiting    Family History  Problem Relation Age of Onset  . Heart disease Mother     Social History:  reports that she quit smoking about 2 years ago. She has a 30.00 pack-year smoking history. She has never used  smokeless tobacco. She reports that she does not drink alcohol or use drugs.  ROS: A complete review of systems was performed.  All systems are negative except for pertinent findings as noted.  Physical Exam:  Vital signs in last 24 hours: Temp:  [98 F (36.7 C)-98.6 F (37 C)] 98 F (36.7 C) (01/20 0504) Pulse Rate:  [60-86] 86 (01/20 0504) Resp:  [18-23] 18 (01/20 0504) BP: (123-168)/(53-89) 128/68 (01/20 0504) SpO2:  [93 %-99 %] 98 % (01/20 0504) Weight:  [68.9  kg (151 lb 14.4 oz)-68.9 kg (152 lb)] 68.9 kg (151 lb 14.4 oz) (01/19 2043) Constitutional:  Alert and oriented, No acute distress Cardiovascular: Regular rate and rhythm, No JVD Respiratory: Normal respiratory effort, Lungs clear bilaterally GI: Abdomen is soft, nontender, nondistended, no abdominal masses GU: No CVA tenderness Lymphatic: No lymphadenopathy Neurologic: Grossly intact, no focal deficits Psychiatric: Normal mood and affect  Laboratory Data:   Recent Labs  10/28/16 1602 10/29/16 0359  WBC 7.9 5.1  HGB 8.2* 8.1*  HCT 25.5* 24.9*  PLT 285 288     Recent Labs  10/28/16 1602 10/29/16 0359  NA 137 137  K 4.1 4.5  CL 101 103  GLUCOSE 101* 126*  BUN 31* 34*  CALCIUM 8.7* 8.5*  CREATININE 2.46* 2.59*     Results for orders placed or performed during the hospital encounter of 10/28/16 (from the past 24 hour(s))  CBC with Differential/Platelet     Status: Abnormal   Collection Time: 10/28/16  4:02 PM  Result Value Ref Range   WBC 7.9 4.0 - 10.5 K/uL   RBC 2.90 (L) 3.87 - 5.11 MIL/uL   Hemoglobin 8.2 (L) 12.0 - 15.0 g/dL   HCT 25.5 (L) 36.0 - 46.0 %   MCV 87.9 78.0 - 100.0 fL   MCH 28.3 26.0 - 34.0 pg   MCHC 32.2 30.0 - 36.0 g/dL   RDW 16.3 (H) 11.5 - 15.5 %   Platelets 285 150 - 400 K/uL   Neutrophils Relative % 84 %   Neutro Abs 6.6 1.7 - 7.7 K/uL   Lymphocytes Relative 5 %   Lymphs Abs 0.4 (L) 0.7 - 4.0 K/uL   Monocytes Relative 10 %   Monocytes Absolute 0.8 0.1 - 1.0 K/uL   Eosinophils Relative 1 %   Eosinophils Absolute 0.1 0.0 - 0.7 K/uL   Basophils Relative 0 %   Basophils Absolute 0.0 0.0 - 0.1 K/uL  Basic metabolic panel     Status: Abnormal   Collection Time: 10/28/16  4:02 PM  Result Value Ref Range   Sodium 137 135 - 145 mmol/L   Potassium 4.1 3.5 - 5.1 mmol/L   Chloride 101 101 - 111 mmol/L   CO2 26 22 - 32 mmol/L   Glucose, Bld 101 (H) 65 - 99 mg/dL   BUN 31 (H) 6 - 20 mg/dL   Creatinine, Ser 2.46 (H) 0.44 - 1.00 mg/dL   Calcium  8.7 (L) 8.9 - 10.3 mg/dL   GFR calc non Af Amer 18 (L) >60 mL/min   GFR calc Af Amer 21 (L) >60 mL/min   Anion gap 10 5 - 15  I-stat troponin, ED     Status: None   Collection Time: 10/28/16  4:28 PM  Result Value Ref Range   Troponin i, poc 0.01 0.00 - 0.08 ng/mL   Comment 3          Urinalysis, Routine w reflex microscopic     Status: Abnormal  Collection Time: 10/28/16  4:49 PM  Result Value Ref Range   Color, Urine YELLOW YELLOW   APPearance CLEAR CLEAR   Specific Gravity, Urine 1.004 (L) 1.005 - 1.030   pH 5.0 5.0 - 8.0   Glucose, UA NEGATIVE NEGATIVE mg/dL   Hgb urine dipstick LARGE (A) NEGATIVE   Bilirubin Urine NEGATIVE NEGATIVE   Ketones, ur NEGATIVE NEGATIVE mg/dL   Protein, ur 30 (A) NEGATIVE mg/dL   Nitrite NEGATIVE NEGATIVE   Leukocytes, UA TRACE (A) NEGATIVE   RBC / HPF TOO NUMEROUS TO COUNT 0 - 5 RBC/hpf   WBC, UA 6-30 0 - 5 WBC/hpf   Bacteria, UA FEW (A) NONE SEEN   Squamous Epithelial / LPF 0-5 (A) NONE SEEN  TSH     Status: Abnormal   Collection Time: 10/29/16  3:49 AM  Result Value Ref Range   TSH 0.090 (L) 0.350 - 4.500 uIU/mL  Basic metabolic panel     Status: Abnormal   Collection Time: 10/29/16  3:59 AM  Result Value Ref Range   Sodium 137 135 - 145 mmol/L   Potassium 4.5 3.5 - 5.1 mmol/L   Chloride 103 101 - 111 mmol/L   CO2 25 22 - 32 mmol/L   Glucose, Bld 126 (H) 65 - 99 mg/dL   BUN 34 (H) 6 - 20 mg/dL   Creatinine, Ser 2.59 (H) 0.44 - 1.00 mg/dL   Calcium 8.5 (L) 8.9 - 10.3 mg/dL   GFR calc non Af Amer 17 (L) >60 mL/min   GFR calc Af Amer 19 (L) >60 mL/min   Anion gap 9 5 - 15  CBC WITH DIFFERENTIAL     Status: Abnormal   Collection Time: 10/29/16  3:59 AM  Result Value Ref Range   WBC 5.1 4.0 - 10.5 K/uL   RBC 2.78 (L) 3.87 - 5.11 MIL/uL   Hemoglobin 8.1 (L) 12.0 - 15.0 g/dL   HCT 24.9 (L) 36.0 - 46.0 %   MCV 89.6 78.0 - 100.0 fL   MCH 29.1 26.0 - 34.0 pg   MCHC 32.5 30.0 - 36.0 g/dL   RDW 16.5 (H) 11.5 - 15.5 %   Platelets 288 150  - 400 K/uL   Neutrophils Relative % 93 %   Neutro Abs 4.8 1.7 - 7.7 K/uL   Lymphocytes Relative 4 %   Lymphs Abs 0.2 (L) 0.7 - 4.0 K/uL   Monocytes Relative 3 %   Monocytes Absolute 0.1 0.1 - 1.0 K/uL   Eosinophils Relative 0 %   Eosinophils Absolute 0.0 0.0 - 0.7 K/uL   Basophils Relative 0 %   Basophils Absolute 0.0 0.0 - 0.1 K/uL  Protime-INR     Status: Abnormal   Collection Time: 10/29/16  7:02 AM  Result Value Ref Range   Prothrombin Time 44.0 (H) 11.4 - 15.2 seconds   INR 4.50 (HH)    No results found for this or any previous visit (from the past 240 hour(s)).  Renal Function:  Recent Labs  10/28/16 1602 10/29/16 0359  CREATININE 2.46* 2.59*   Estimated Creatinine Clearance: 17.3 mL/min (by C-G formula based on SCr of 2.59 mg/dL (H)).  Radiologic Imaging: Dg Chest 2 View  Result Date: 10/28/2016 CLINICAL DATA:  Dyspnea, cough, history of COPD and lung cancer. Ex-smoker. EXAM: CHEST  2 VIEW COMPARISON:  CT from 09/19/2016 and CXR 05/29/2016 FINDINGS: The heart is top-normal in size. There is aortic atherosclerosis without aneurysm. Diffuse interstitial prominence is noted bilaterally left greater than right  with superimposed airspace opacities about the left hilum. Findings may reflect pneumonia superimposed on interstitial lung disease, the interstitial prominence possibly related to bronchitis, post treatment change or fibrosis. Slight volume loss with tenting of left inferior pulmonary ligament. Lesser degree of interstitial markings at the right costophrenic angle and right upper lobe. There appears be a trace left effusion. No pneumothorax. No acute osseous abnormality. IMPRESSION: Findings suspicious for left-sided perihilar pneumonia superimposed on interstitial lung disease. Follow-up therefore recommended to ensure clearance. Followup PA and lateral chest X-ray is recommended in 3-4 weeks following trial of antibiotic therapy to ensure resolution. Trace left effusion.  Electronically Signed   By: Ashley Royalty M.D.   On: 10/28/2016 15:40    I independently reviewed the above imaging studies.  Impression/Recommendation: 76 yo female with mild gross hematuria, AKI, and anemia. She will need to undergo a hematuria evaluation given her gross hematuria, ideally with contrasted CT abd/pelvis and cystoscopy. Unfortunately, her AKI does not allow for this currently. There does not appear to be an obstructive process responsible for her acute renal failure based on ultrasound imaging. Possible etiologies for her hematuria and the rationale for hematuria work up were discussed with her. She does not appear to have any significant ongoing hematuria, but any present may be worsened by her current supratherapeutic INR.  - Will arrange for outpatient CT abd/pelvis with contrast once AKI resolves. If creatinine does not improve, can consider CT abd/pelvis without contrast sooner. - Follow up urine culture and treat if indicated - Would address supratherapeutic INR as this may exacerbate any hematuria - We will arrange for cystoscopy in urology clinic  [facility] Sukhu 10/29/2016, 9:27 AM

## 2016-10-30 ENCOUNTER — Inpatient Hospital Stay (HOSPITAL_COMMUNITY): Payer: PPO

## 2016-10-30 DIAGNOSIS — J810 Acute pulmonary edema: Secondary | ICD-10-CM

## 2016-10-30 DIAGNOSIS — I509 Heart failure, unspecified: Secondary | ICD-10-CM

## 2016-10-30 DIAGNOSIS — N179 Acute kidney failure, unspecified: Secondary | ICD-10-CM

## 2016-10-30 DIAGNOSIS — J9601 Acute respiratory failure with hypoxia: Secondary | ICD-10-CM

## 2016-10-30 DIAGNOSIS — J9621 Acute and chronic respiratory failure with hypoxia: Principal | ICD-10-CM

## 2016-10-30 DIAGNOSIS — J189 Pneumonia, unspecified organism: Secondary | ICD-10-CM

## 2016-10-30 LAB — PROTIME-INR
INR: 2.3
PROTHROMBIN TIME: 25.7 s — AB (ref 11.4–15.2)

## 2016-10-30 LAB — CBC
HCT: 22.7 % — ABNORMAL LOW (ref 36.0–46.0)
HEMOGLOBIN: 7.4 g/dL — AB (ref 12.0–15.0)
MCH: 29.4 pg (ref 26.0–34.0)
MCHC: 32.6 g/dL (ref 30.0–36.0)
MCV: 90.1 fL (ref 78.0–100.0)
PLATELETS: 311 10*3/uL (ref 150–400)
RBC: 2.52 MIL/uL — ABNORMAL LOW (ref 3.87–5.11)
RDW: 16.8 % — ABNORMAL HIGH (ref 11.5–15.5)
WBC: 17.2 10*3/uL — ABNORMAL HIGH (ref 4.0–10.5)

## 2016-10-30 LAB — BASIC METABOLIC PANEL
Anion gap: 11 (ref 5–15)
BUN: 40 mg/dL — AB (ref 6–20)
CALCIUM: 8.5 mg/dL — AB (ref 8.9–10.3)
CO2: 24 mmol/L (ref 22–32)
Chloride: 103 mmol/L (ref 101–111)
Creatinine, Ser: 2.57 mg/dL — ABNORMAL HIGH (ref 0.44–1.00)
GFR, EST AFRICAN AMERICAN: 20 mL/min — AB (ref >60)
GFR, EST NON AFRICAN AMERICAN: 17 mL/min — AB (ref >60)
Glucose, Bld: 163 mg/dL — ABNORMAL HIGH (ref 65–99)
Potassium: 4.1 mmol/L (ref 3.5–5.1)
SODIUM: 138 mmol/L (ref 135–145)

## 2016-10-30 LAB — BLOOD GAS, ARTERIAL
Acid-Base Excess: 4.6 mmol/L — ABNORMAL HIGH (ref 0.0–2.0)
Bicarbonate: 27.2 mmol/L (ref 20.0–28.0)
Drawn by: 441261
O2 CONTENT: 10 L/min
O2 Saturation: 87.8 %
PCO2 ART: 33.1 mmHg (ref 32.0–48.0)
PH ART: 7.525 — AB (ref 7.350–7.450)
PO2 ART: 55.3 mmHg — AB (ref 83.0–108.0)
Patient temperature: 98.6

## 2016-10-30 LAB — URINE CULTURE: Culture: NO GROWTH

## 2016-10-30 LAB — ECHOCARDIOGRAM COMPLETE
Height: 64 in
Weight: 2430.35 oz

## 2016-10-30 LAB — PREPARE RBC (CROSSMATCH)

## 2016-10-30 LAB — TROPONIN I: TROPONIN I: 0.09 ng/mL — AB (ref <0.03)

## 2016-10-30 LAB — BRAIN NATRIURETIC PEPTIDE: B NATRIURETIC PEPTIDE 5: 963.1 pg/mL — AB (ref 0.0–100.0)

## 2016-10-30 LAB — PROCALCITONIN: Procalcitonin: 0.36 ng/mL

## 2016-10-30 LAB — T4, FREE: FREE T4: 1.56 ng/dL — AB (ref 0.61–1.12)

## 2016-10-30 MED ORDER — LIP MEDEX EX OINT
TOPICAL_OINTMENT | CUTANEOUS | Status: AC
  Administered 2016-10-30: 08:00:00
  Filled 2016-10-30: qty 7

## 2016-10-30 MED ORDER — FUROSEMIDE 10 MG/ML IJ SOLN
40.0000 mg | INTRAMUSCULAR | Status: DC
  Administered 2016-10-30: 40 mg via INTRAVENOUS
  Filled 2016-10-30: qty 4

## 2016-10-30 MED ORDER — METHYLPREDNISOLONE SODIUM SUCC 40 MG IJ SOLR
40.0000 mg | Freq: Two times a day (BID) | INTRAMUSCULAR | Status: DC
  Administered 2016-10-30 – 2016-11-01 (×4): 40 mg via INTRAVENOUS
  Filled 2016-10-30 (×4): qty 1

## 2016-10-30 MED ORDER — FUROSEMIDE 10 MG/ML IJ SOLN: 20.0000 mg | INTRAMUSCULAR | Status: DC

## 2016-10-30 MED ORDER — FUROSEMIDE 10 MG/ML IJ SOLN
40.0000 mg | INTRAMUSCULAR | Status: AC
  Administered 2016-10-30: 40 mg via INTRAVENOUS

## 2016-10-30 MED ORDER — IPRATROPIUM-ALBUTEROL 0.5-2.5 (3) MG/3ML IN SOLN
3.0000 mL | Freq: Four times a day (QID) | RESPIRATORY_TRACT | Status: DC
  Administered 2016-10-30 – 2016-11-04 (×19): 3 mL via RESPIRATORY_TRACT
  Filled 2016-10-30 (×19): qty 3

## 2016-10-30 MED ORDER — BUDESONIDE 0.5 MG/2ML IN SUSP
0.5000 mg | Freq: Two times a day (BID) | RESPIRATORY_TRACT | Status: DC
  Administered 2016-10-30 – 2016-11-04 (×10): 0.5 mg via RESPIRATORY_TRACT
  Filled 2016-10-30 (×10): qty 2

## 2016-10-30 MED ORDER — FUROSEMIDE 10 MG/ML IJ SOLN
INTRAMUSCULAR | Status: AC
  Filled 2016-10-30: qty 4

## 2016-10-30 MED ORDER — LIP MEDEX EX OINT
TOPICAL_OINTMENT | CUTANEOUS | Status: AC
  Administered 2016-10-30: 16:00:00
  Filled 2016-10-30: qty 7

## 2016-10-30 NOTE — Progress Notes (Signed)
Rt placed pt on BIPAP/NIV due to WOB.

## 2016-10-30 NOTE — Progress Notes (Signed)
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 The patient went to see her primary care provider on 10/18/2016. Apparently, the patient was diagnosed with influenza and started on oseltamivir. She did not have significant improvement and continued to have shortness of breath and coughing with yellow sputum. Her shortness of breath has progressed to the point of having dyspnea with minimal exertion. As a result presented to the hospital for further evaluation. The patient called her primary care provider's office back, and was started on azithromycin on 10/26/2016. She has had low-grade fevers at home running between 99.0-100.31F. She denies any nausea, vomiting, diarrhea, abdominal pain, dysuria. There is been no hemoptysis. However, the patient has had gross hematuria for the past 2-3 days. She states that she has had intermittent hematuria since the beginning of December 2017. She denies any over-the-counter NSAIDs. Upon presentation to the emergency department, the patient was noted to have serum creatinine 2.46 and hemoglobin 8.2. INR was 4.50. Urinalysis showed gross and microscopic hematuria. Urology was consulted. In the early morning of 10/30/2016, the patient developed respiratory distress. Chest x-ray showed increase in left lung opacification as well as increased opacification in the right lung periphery. The patient was transferred to stepdown, and PCCM was consulted.  Assessment/Plan: Acute respiratory failure with hypoxia -Secondary to pneumonia -early am 10/30/16--pt developed respiratory distress--?TRALI vs ARDS vs pulm edema -lasix 40 mg IV x 1 given at 0530 without much  improvement 10/30/16--transfer to stepdown -consulted PCCM--spoke with Dr. Corrie Dandy -place on BiPAP -Continue antibiotics -Continue bronchodilators -echo  HCAP -Given her relative immunosuppressed state, will treat pt as HCAP -continue vancomycin and cefepime pending culture data -MRSA screen--neg-->d/c Vanco -viral respiratory panel--negative -10/28/2016 chest x-ray--left perihilar infiltrate; trace left effusion -10/30/16 CXR--diffuse L-lung infiltrates, R-lung peripheral opacities -check procalcitonin -check lactate  Acute blood loss anemia -Hemoglobin baseline approx 10 -presenting Hgb 8.2-->7.4 -d/c coumadin for now -drop partly due to dilution due to IVF -one unit of PRBC ordered  Hematuria -urology consult appreciated-->ultimately need cystoscopy and CT abd -cannot give contrast due to acute on chronic renal failure -pt had microscopic hematuria dating back to [facility] on 01/20/15  Acute on chronic renal failure--CKD 3 -renal US--mild right collecting system fullness without hydronephrosis -may need renal consult if no improvement -check CK--114 -d/c losartan  Coagulopathy -due to coumadin--holding presently due to hematuria and ABLA -now with small amount of hemoptysis -supratherapeutic INR likely due recent use of azithromycin -one unit FFP given 10/29/16 -INR 4.50-->2.30  Paroxysmal atrial fibrillation -Hold coumadin due to hematuria and ABLA -CHADS-VASc = 5 -presently in sinus  HTN -d/c amlodipine -continue metoprolol succinate  COPD -stable without wheezing -continue bronchodilators  Squamous cell carcinoma Lung -last chemo 08/01/16 -follows Dr. Julien Nordmann -wants to defer LUL lobectomy  Hyperlipidemia -continue statin     Disposition Plan:   Transfer to stepdown Family Communication:   No Family at bedside--Total time spent 35 minutes.  Greater than 50% spent face to face counseling and coordinating care.   Consultants: urology    Code Status:  FULL--confirmed with patient  DVT Prophylaxis:  coumadin   Procedures: As Listed in Progress Note Above  Antibiotics: vanco 10/28/16>>> Cefepime 10/28/16>>>    Subjective:  Patient complaint shortness of breath. She denies any chest pain, nausea, vomiting, diarrhea, abdominal pain, dysuria, She complains of a small amount of hemoptysis. Denies any headache, neck pain. She still has a small amount of hematuria.  Objective: Vitals:   10/30/16 0435 10/30/16 0456 10/30/16 0510 10/30/16 0910  BP: (!) 153/64     Pulse: 85   86  Resp: 20   (!) 26  Temp: 98.5 F (36.9 C)     TempSrc: Oral     SpO2: (!) 87% (!) 84% (!) 89% 92%  Weight:      Height:        Intake/Output Summary (Last 24 hours) at 10/30/16 1129 Last data filed at 10/30/16 0948  Gross per 24 hour  Intake          1074.75 ml  Output             1600 ml  Net          -525.25 ml   Weight change:  Exam:   General:  Pt is alert, follows commands appropriately, not in acute distress  HEENT: No icterus, No thrush, No neck mass, [place]/AT  Cardiovascular: RRR, S1/S2, no rubs, no gallops  Respiratory: Bilateral crackles. No wheeze. Good air movement  Abdomen: Soft/+BS, non tender, non distended, no guarding  Extremities: No edema, No lymphangitis, No petechiae, No rashes, no synovitis   Data Reviewed: I have personally reviewed following labs and imaging studies Basic Metabolic Panel:  Recent Labs Lab 10/28/16 1602 10/29/16 0359 10/30/16 0524  NA 137 137 138  K 4.1 4.5 4.1  CL 101 103 103  CO2 '26 25 24  '$ GLUCOSE 101* 126* 163*  BUN 31* 34* 40*  CREATININE 2.46* 2.59* 2.57*  CALCIUM 8.7* 8.5* 8.5*   Liver Function Tests: No results for input(s): AST, ALT, ALKPHOS, BILITOT, PROT, ALBUMIN in the last 168 hours. No results for input(s): LIPASE, AMYLASE in the last 168 hours. No results for input(s): AMMONIA in the last 168 hours. Coagulation Profile:  Recent Labs Lab  10/29/16 0702 10/30/16 0524  INR 4.50* 2.30   CBC:  Recent Labs Lab 10/28/16 1602 10/29/16 0359 10/30/16 0524  WBC 7.9 5.1 17.2*  NEUTROABS 6.6 4.8  --   HGB 8.2* 8.1* 7.4*  HCT 25.5* 24.9* 22.7*  MCV 87.9 89.6 90.1  PLT 285 288 311   Cardiac Enzymes:  Recent Labs Lab 10/29/16 1043  CKTOTAL 114   BNP: Invalid input(s): POCBNP CBG: No results for input(s): GLUCAP in the last 168 hours. HbA1C: No results for input(s): HGBA1C in the last 72 hours. Urine analysis:    Component Value Date/Time   COLORURINE YELLOW 10/28/2016 1649   APPEARANCEUR CLEAR 10/28/2016 1649   LABSPEC 1.004 (L) 10/28/2016 1649   PHURINE 5.0 10/28/2016 1649   GLUCOSEU NEGATIVE 10/28/2016 1649   HGBUR LARGE (A) 10/28/2016 1649   BILIRUBINUR NEGATIVE 10/28/2016 1649   KETONESUR NEGATIVE 10/28/2016 1649   PROTEINUR 30 (A) 10/28/2016 1649   NITRITE NEGATIVE 10/28/2016 1649   LEUKOCYTESUR TRACE (A) 10/28/2016 1649   Sepsis Labs: '@LABRCNTIP'$ (procalcitonin:4,lacticidven:4) ) Recent Results (from the past 240 hour(s))  Culture, Urine     Status: None   Collection Time: 10/29/16  1:32 AM  Result Value Ref Range Status   Specimen Description URINE, CLEAN CATCH  Final   Special Requests NONE  Final   Culture   Final    NO GROWTH Performed at [facility], [place] [address] [place], [place] [address]  Report Status 10/30/2016 FINAL  Final  Respiratory Panel by PCR     Status: None   Collection Time: 10/29/16 11:03 AM  Result Value Ref Range Status   Adenovirus NOT DETECTED NOT DETECTED Final   Coronavirus 229E NOT DETECTED NOT DETECTED Final   Coronavirus HKU1 NOT DETECTED NOT DETECTED Final   Coronavirus NL63 NOT DETECTED NOT DETECTED Final   Coronavirus OC43 NOT DETECTED NOT DETECTED Final   Metapneumovirus NOT DETECTED NOT DETECTED Final   Rhinovirus / Enterovirus NOT DETECTED NOT DETECTED Final   Influenza A NOT DETECTED NOT DETECTED Final   Influenza B NOT DETECTED NOT DETECTED  Final   Parainfluenza Virus 1 NOT DETECTED NOT DETECTED Final   Parainfluenza Virus 2 NOT DETECTED NOT DETECTED Final   Parainfluenza Virus 3 NOT DETECTED NOT DETECTED Final   Parainfluenza Virus 4 NOT DETECTED NOT DETECTED Final   Respiratory Syncytial Virus NOT DETECTED NOT DETECTED Final   Bordetella pertussis NOT DETECTED NOT DETECTED Final   Chlamydophila pneumoniae NOT DETECTED NOT DETECTED Final   Mycoplasma pneumoniae NOT DETECTED NOT DETECTED Final    Comment: Performed at [facility], [place] [address] [place], [place] [address]  MRSA PCR Screening     Status: None   Collection Time: 10/29/16 11:03 AM  Result Value Ref Range Status   MRSA by PCR NEGATIVE NEGATIVE Final    Comment:        The GeneXpert MRSA Assay (FDA approved for NASAL specimens only), is one component of a comprehensive MRSA colonization surveillance program. It is not intended to diagnose MRSA infection nor to guide or monitor treatment for MRSA infections.   Culture, sputum-assessment     Status: None   Collection Time: 10/29/16 11:30 AM  Result Value Ref Range Status   Specimen Description SPUTUM  Final   Special Requests Normal  Final   Sputum evaluation THIS SPECIMEN IS ACCEPTABLE FOR SPUTUM CULTURE  Final   Report Status 10/29/2016 FINAL  Final  Culture, respiratory (NON-Expectorated)     Status: None (Preliminary result)   Collection Time: 10/29/16 11:30 AM  Result Value Ref Range Status   Specimen Description SPUTUM  Final   Special Requests Normal Reflexed from [id number]  Final   Gram Stain   Final    FEW WBC PRESENT, PREDOMINANTLY PMN MODERATE SQUAMOUS EPITHELIAL CELLS PRESENT MODERATE GRAM POSITIVE COCCI IN PAIRS FEW GRAM VARIABLE ROD RARE GRAM NEGATIVE COCCI Performed at [facility], [place] [address], [place], [place] [address]    Culture PENDING  Incomplete   Report Status PENDING  Incomplete     Scheduled Meds: . atorvastatin  80 mg Oral Daily  . ceFEPime (MAXIPIME)  IV  1 g Intravenous QHS  . dextromethorphan  60 mg Oral BID  . feeding supplement (ENSURE ENLIVE)  237 mL Oral BID BM  . furosemide  20 mg Intravenous See admin instructions  . ipratropium-albuterol  3 mL Nebulization TID  . levothyroxine  125 mcg Oral QAC breakfast  . metoprolol succinate  50 mg Oral BID  . vancomycin  1,000 mg Intravenous Q48H   Continuous Infusions:  Procedures/Studies: Dg Chest 2 View  Result Date: 10/28/2016 CLINICAL DATA:  Dyspnea, cough, history of COPD and lung cancer. Ex-smoker. EXAM: CHEST  2 VIEW COMPARISON:  CT from 09/19/2016 and CXR 05/29/2016 FINDINGS: The heart is top-normal in size. There is aortic atherosclerosis without aneurysm. Diffuse interstitial prominence is noted bilaterally left greater than right with superimposed airspace opacities about  the left hilum. Findings may reflect pneumonia superimposed on interstitial lung disease, the interstitial prominence possibly related to bronchitis, post treatment change or fibrosis. Slight volume loss with tenting of left inferior pulmonary ligament. Lesser degree of interstitial markings at the right costophrenic angle and right upper lobe. There appears be a trace left effusion. No pneumothorax. No acute osseous abnormality. IMPRESSION: Findings suspicious for left-sided perihilar pneumonia superimposed on interstitial lung disease. Follow-up therefore recommended to ensure clearance. Followup PA and lateral chest X-ray is recommended in 3-4 weeks following trial of antibiotic therapy to ensure resolution. Trace left effusion. Electronically Signed   By: Ashley Royalty M.D.   On: 10/28/2016 15:40   US Renal  Result Date: 10/29/2016 CLINICAL DATA:  Acute on chronic renal failure. History of squamous cell carcinoma of the left lung. EXAM: RENAL / URINARY TRACT ULTRASOUND COMPLETE COMPARISON:  CT of the abdomen and pelvis on 07/10/2013 FINDINGS: Right Kidney: Length: 10.6 cm. The right kidney demonstrates increased  cortical echogenicity with poor corticomedullary differentiation. Findings are consistent with chronic kidney disease. Very mild fullness of the renal collecting system noted without significant hydronephrosis. No shadowing calculi identified. By ultrasound, a focal complex mass is identified in the medial aspect of the lower right kidney measuring approximately 2.3 x 2.2 x 2.2 cm. This appears predominately solid and partially cystic. Left Kidney: Length: 10.4 cm. The left kidney demonstrates increased cortical echogenicity consistent with chronic kidney disease. No hydronephrosis identified. Small simple cyst of the anterior interpolar kidney measures approximately 0.9 x 1.2 x 1.0 cm and has the appearance of a benign cyst. Bladder: Appears normal for degree of bladder distention. IMPRESSION: 1. Equal sized kidneys with increased cortical echogenicity bilaterally. Findings are consistent with known chronic kidney disease. 2. Mild fullness of the right renal collecting system without significant hydronephrosis. No evidence of left hydronephrosis. 3. 2.3 cm mass of the inferior medial right kidney which appearance predominately solid and partially cystic. Although this could represent a complex cyst, malignancy is not excluded. In the setting of acute renal failure, the patient likely cannot receive iodinated contrast or gadolinium. Recommend imaging follow-up for the right renal lesion. Either unenhanced MRI of or MRI with and without gadolinium in the future would be the best way to evaluate the right renal mass. 4. Simple cyst of the left kidney appears benign. Electronically Signed   By: Aletta Edouard M.D.   On: 10/29/2016 11:12   Dg Chest Port 1 View  Result Date: 10/30/2016 CLINICAL DATA:  76 y/o  F; hypoxia. EXAM: PORTABLE CHEST 1 VIEW COMPARISON:  10/28/2016 chest radiograph. FINDINGS: Diffuse increase in left lung opacification and beginning opacification of the peripheries of the right lung. Stable  cardiac silhouette given projection and technique. Aortic atherosclerosis with calcification. Mild degenerative changes of the spine. IMPRESSION: Marked diffuse interval increase in left lung opacification and developing opacifications at the periphery of the right lung probably representing pneumonitis, atypical for pulmonary edema. The peripheral pattern suggests cryptogenic organizing pneumonia or eosinophilic pneumonia. Consider CT of chest for further characterization. Electronically Signed   By: Kristine Garbe M.D.   On: 10/30/2016 06:23    Symiah Nowotny, DO  [facility] Pager [phone]  If 7PM-7AM, please contact night-coverage [email or web address] Password [email or web address] 10/30/2016, 11:29 AM   LOS: 2 days

## 2016-10-30 NOTE — H&P (Signed)
Pager regarding Renee Vance with increased work of breathing and decreasing O2 saturations into the 80s despite increasing nasal cannula oxygen. Patient recently given plasma the previous day. Total fluid noted to be net positive. Given 40 Lasix IV after evaluating patient noting significant crackles on physical exam noting previous chest x-ray with fibrosis noted. Systolic blood pressure stable in the 150s. Chest x-ray was obtained and by my interpretation appears to show increased edema. Patient likely also needs a unit of blood as hemoglobin noted to be 7.4.

## 2016-10-30 NOTE — Progress Notes (Signed)
Pt found on non-heated hfnc at 10L, HR92, rr25, spo2 97%.  Pt given scheduled nebulizer treatment which she tolerated well.  Pt requests that I come back and place her on the bipap after she gets her bedtime meds.  RN aware and this RT will return later per pt request.  Pt weaned down to 8L hfnc, spo2 95-96%.

## 2016-10-30 NOTE — Progress Notes (Signed)
Nutrition Brief Note  Patient identified on the Malnutrition Screening Tool (MST) Report  Weight is stable. Pt currently eating 65-100% of meals.   Wt Readings from Last 15 Encounters:  10/28/16 151 lb 14.4 oz (68.9 kg)  10/17/16 152 lb 12.8 oz (69.3 kg)  10/11/16 153 lb (69.4 kg)  09/28/16 153 lb 9.6 oz (69.7 kg)  09/26/16 153 lb 6.4 oz (69.6 kg)  08/09/16 153 lb 3.2 oz (69.5 kg)  08/08/16 150 lb 11.2 oz (68.4 kg)  08/02/16 153 lb 6.4 oz (69.6 kg)  07/25/16 148 lb 6.4 oz (67.3 kg)  07/19/16 151 lb 3.2 oz (68.6 kg)  07/12/16 152 lb (68.9 kg)  07/11/16 151 lb 11.2 oz (68.8 kg)  07/05/16 152 lb 14.4 oz (69.4 kg)  06/23/16 154 lb 9.6 oz (70.1 kg)  06/02/16 154 lb (69.9 kg)    Body mass index is 26.07 kg/m. Patient meets criteria for overweight based on current BMI.   Current diet order is regular, patient is consuming approximately 65-100% of meals at this time. Labs and medications reviewed.   No nutrition interventions warranted at this time. If nutrition issues arise, please consult RD.   Clayton Bibles, MS, RD, LDN Pager: [phone] After Hours Pager: [phone]

## 2016-10-30 NOTE — Progress Notes (Signed)
Pt started shift on O2 at 2L. When RT saw pt around 2030, she had a sat of 87% on 2L, so he increased her O2 to 4L to keep her above 90%. This morning, her O2 sat was 82-84% on 4L, increased to 87% with instructed breathing through nose. She has mild crackles in her bases. Paged on call MD who suggested neb tx, paged RT for this. Post neb, she was placed on 8L O2 via HFNC and sats were 88% at best. She stated she felt about the same, but her WOB does appear to be worse. Paged on call MD with this information and stat CXR ordered. MD then in room to see pt, lasix '40mg'$  IV ordered and given. Continue to monitor. Hortencia Conradi RN

## 2016-10-30 NOTE — Progress Notes (Signed)
  Echocardiogram 2D Echocardiogram has been performed.  Tresa Res 10/30/2016, 10:51 AM

## 2016-10-30 NOTE — Consult Note (Addendum)
PULMONARY / CRITICAL CARE MEDICINE   Name: Renee Vance MRN: [id number] DOB: 01-15-1941    ADMISSION DATE:  10/28/2016 CONSULTATION DATE:  10/30/2016  REFERRING MD:  Dr. 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  Patient was seen by her primary care doctor. Was given azithromycin on January 17. She continued to worsen prompting ED visit. Upon presentation to the emergency department, the patient was noted to have serum creatinine 2.46 and hemoglobin 8.2. INR was 4.50. Urinalysis showed gross and microscopic hematuria.  She was given 1 u FFP. She was given IV antibiotics for possible pneumonia. She was admitted to the floors.  The last 24 hours, she was noted to be more in respiratory distress. She got Lasix but did not get better. PCCM was consulted for further recommendations.  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  PAST SURGICAL HISTORY: She  has a past surgical history that includes Cholecystectomy; Tubal ligation; Coronary angioplasty with stent (2011); and Video bronchoscopy (Bilateral, 06/07/2016).  Allergies  Allergen Reactions  . Ampicillin Nausea And Vomiting    Has patient had a PCN reaction  causing immediate rash, facial/tongue/throat swelling, SOB or lightheadedness with hypotension: no Has patient had a PCN reaction causing severe rash involving mucus membranes or skin necrosis: no Has patient had a PCN reaction that required hospitalization: no Has patient had a PCN reaction occurring within the last 10 years: no If all of the above answers are "NO", then may proceed with Cephalosporin use  . Codeine Nausea And Vomiting    No current facility-administered medications on file prior to encounter.    Current Outpatient Prescriptions on File Prior to Encounter  Medication Sig  . amLODipine (NORVASC) 5 MG tablet Take 5 mg by mouth daily.  Marland Kitchen aspirin 81 MG tablet Take 81 mg by mouth daily.  Marland Kitchen atorvastatin (LIPITOR) 80 MG tablet Take 80 mg by mouth daily.  Marland Kitchen dextromethorphan (DELSYM) 30 MG/5ML liquid Take 60 mg by mouth 2 (two) times daily.   Marland Kitchen levothyroxine (SYNTHROID, LEVOTHROID) 125 MCG tablet Take 125 mcg by mouth daily before breakfast.   . metoprolol succinate (TOPROL-XL) 50 MG 24 hr tablet Take 1 tablet (50 mg total) by mouth 2 (two) times daily.  Marland Kitchen tiotropium (SPIRIVA) 18 MCG inhalation capsule Place 18 mcg into inhaler and inhale daily.  . Vitamin D, Cholecalciferol, 1000 UNITS CAPS Take 1,000 Units by mouth daily.   Marland Kitchen warfarin (COUMADIN) 5 MG tablet Take 1/2 tablet daily except 1 tablet on Sundays and Thursdays (Patient taking differently: Take 2.'5mg'$  Sunday, Tuesday, Thursday. Take '5mg'$  by mouth on Monday, Wednesday, Friday, Saturday)    FAMILY HISTORY:  Her indicated that her mother is deceased. She indicated that her father is deceased.    SOCIAL HISTORY: She  reports that she quit smoking about 2 years ago. She has a 30.00 pack-year smoking history.  She has never used smokeless tobacco. She reports that she does not drink alcohol or use drugs.  REVIEW OF SYSTEMS:   As above. Patient with cough, dyspnea worse in the last 3 weeks. Significantly worse the last 24  hours. Fevers last night.  SUBJECTIVE:  As above  VITAL SIGNS: BP (!) 159/52   Pulse 86   Temp 97.9 F (36.6 C) (Axillary)   Resp (!) 31   Ht '5\' 4"'$  (1.626 m)   Wt 68.9 kg (151 lb 14.4 oz)   SpO2 95%   BMI 26.07 kg/m   HEMODYNAMICS:    VENTILATOR SETTINGS: Vent Mode: BIPAP FiO2 (%):  [30 %] 30 % PEEP:  [5 cmH20] 5 cmH20  INTAKE / OUTPUT: I/O last 3 completed shifts: In: 2204.8 [P.O.:1560; I.V.:268.8; Blood:326; IV Piggyback:50] Out: 2175 [Urine:2175]  PHYSICAL EXAMINATION: General:  In mild respiratory distress. Tolerating BiPAP. Neuro:  Cranial nerves grossly intact. No lateralizing signs. HEENT:  PERLA. (-) NVD. With bipap on. Comfortable Cardiovascular:  Good s1/s2. (--) s3/m/r/g Lungs:  Good ae. Crackles mid-bases. Occasional wheezing (-) rhonchi. Occasional accesory muscle use.  Abdomen:  Soft, benign. (-) masses/tendernes.  Musculoskeletal:  Warm extremities. Trace edema.  Skin:  (-) clubbing/cyanosis.   LABS:  BMET  Recent Labs Lab 10/28/16 1602 10/29/16 0359 10/30/16 0524  NA 137 137 138  K 4.1 4.5 4.1  CL 101 103 103  CO2 '26 25 24  '$ BUN 31* 34* 40*  CREATININE 2.46* 2.59* 2.57*  GLUCOSE 101* 126* 163*    Electrolytes  Recent Labs Lab 10/28/16 1602 10/29/16 0359 10/30/16 0524  CALCIUM 8.7* 8.5* 8.5*    CBC  Recent Labs Lab 10/28/16 1602 10/29/16 0359 10/30/16 0524  WBC 7.9 5.1 17.2*  HGB 8.2* 8.1* 7.4*  HCT 25.5* 24.9* 22.7*  PLT 285 288 311    Coag's  Recent Labs Lab 10/29/16 0702 10/30/16 0524  INR 4.50* 2.30    Sepsis Markers  Recent Labs Lab 10/30/16 1204  PROCALCITON 0.36    ABG  Recent Labs Lab 10/30/16 1300  PHART 7.525*  PCO2ART 33.1  PO2ART 55.3*    Liver Enzymes No results for input(s): AST, ALT, ALKPHOS, BILITOT, ALBUMIN in the last 168 hours.  Cardiac Enzymes No results for input(s): TROPONINI, PROBNP in the last 168 hours.  Glucose No results for input(s): GLUCAP in the last 168  hours.  Imaging Dg Chest Port 1 View  Result Date: 10/30/2016 CLINICAL DATA:  77 y/o  F; hypoxia. EXAM: PORTABLE CHEST 1 VIEW COMPARISON:  10/28/2016 chest radiograph. FINDINGS: Diffuse increase in left lung opacification and beginning opacification of the peripheries of the right lung. Stable cardiac silhouette given projection and technique. Aortic atherosclerosis with calcification. Mild degenerative changes of the spine. IMPRESSION: Marked diffuse interval increase in left lung opacification and developing opacifications at the periphery of the right lung probably representing pneumonitis, atypical for pulmonary edema. The peripheral pattern suggests cryptogenic organizing pneumonia or eosinophilic pneumonia. Consider CT of chest for further characterization. Electronically Signed   By: Kristine Garbe M.D.   On: 10/30/2016 06:23     STUDIES:  Echo 5/36 N EF, diastolic dysfxn  CULTURES: Blood 1/20 > MRSA 1/20 (-) Sputum 1/20 >  RVP 1/20 > (-) Urine 1/20 >  ANTIBIOTICS: Rocephin 1/20 > 1/20 Cefepime 1/21 > Azithro 1/20 > 1/20  SIGNIFICANT EVENTS: 1/20 admit for dyspnea,cough, wheezing, hematuria. Symptoms have been going on for 3 weeks. 1/21 transferred to stepdown unit. BiPAP started.  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 She was started on antibiotics for pneumonia. The last 12 hrs, she was noted to be more tachypneic requiring transfer to stepdown unit and placed on BiPAP. Her INR was elevated on admission for which she got 1 unit fresh frozen plasma. She was also diuresed this day without much improvement with her breathing.   ASSESSMENT / PLAN: Acute on chronic hypoxemic respiratory failure, worse the  last 12 hours. Differentials include the following: Likely pulmonary edema, possible healthcare associated pneumonia, on top of mild acute exacerbation of COPD. - she is better on the BiPAP, 10/5, 30% FiO2. Her volume is ranging from 400-500. Her O2 saturation is 94%. Continue BiPAP for now. obviously, if she worsens, she will need to be intubated. - continue diuresis. She has received 40 mg IV Lasix. Plan to give another 40 mg IV Lasix with blood transfusion. Observe creatinine. - Continue cefepime for now. De-escalate once with cultures. Follow up on cultures. - Start Pulmicort 0.5 mg twice a day via nebulizer. Increase DuoNeb to 4 times a day. - Start low-dose IV steroids to help with acute exacerbation of COPD. - Cycle troponin to make sure she is not having demand ischemia - agree with PCT.  - keep NPO for now while on bipap.   AKI on top of CKD - not sure etiology.  - there might be some fluid overload after transfusion of FFP.  Currently getting iu pRBC. Lasix after transfusion.   Lung CA. St II NSCLCA. Presumably in remission. Finished chemo and radiation in 07/2016 - she has a f/u with Dr. Earlie Server and Dr. Casandra Doffing further treatment.  - Is not a surgical candidate  CAD - she sees Dr. Tamala Julian.  - cont cardiac meds.  - cycle troponin  Gross hematuria. Likely related to elevated INR. INR is better. - Urology following. Observe urine for now   FAMILY  - Updates: I extensively discussed the plan of care with the patient and her husband. Patient remains a full code.  - Inter-disciplinary family meet or Palliative Care meeting due by:  1/28    I spent  35  minutes of Critical Care time with this patient today.    Monica Becton, MD Pulmonary and [facility] Pager: [phone] After 3 pm or if no response, call [phone]  10/30/2016, 2:35 PM

## 2016-10-30 NOTE — Progress Notes (Signed)
Pt began complaining of pain in her right hand during her blood transfusion.  Upon assessment, pt's hand had become swollen and appeared to be bruised.  The blood transfusion was immediately paused and disconnected from her IV.  A second IV was placed and the blood transfusion continued through said IV.  Following removal of IV from her hand, slight pressure was placed with gauze over the site to stop bleeding.  A cool compress was placed over the site to decrease the swelling and tenderness in her hand.  No further complications were assessed.  Pt complains of some pain with pressure to her hand. However, swelling has since decreased.  Blood transfusion was completed without further complications.  Will continue to monitor pt and hand.

## 2016-10-30 NOTE — Progress Notes (Signed)
40 mg IV Lasix administered, per transcribed order, following blood transfusion.  Will continue to monitor.

## 2016-10-30 NOTE — Progress Notes (Signed)
CRITICAL VALUE ALERT  Critical value received:  Troponin - 0.09  Date of notification:  10/30/2016  Time of notification:  2035  Critical value read back:Yes.    Nurse who received alert:  Rupert Stacks, RN  MD notified (1st page):  Raliegh Ip Schorr  Time of first page:  2042  MD notified (2nd page):  Time of second page:  Responding MD:  Lamar Blinks  Time MD responded:  2052

## 2016-10-30 NOTE — Progress Notes (Signed)
Subjective: The patient is doing well. Good UOP with stable creatinine at 2.6. Had acute desaturation last night, given lasix. No significant hematuria overnight.  Objective: Vital signs in last 24 hours: Temp:  [98 F (36.7 C)-99.3 F (37.4 C)] 98.5 F (36.9 C) (01/21 0435) Pulse Rate:  [80-97] 86 (01/21 0910) Resp:  [18-26] 26 (01/21 0910) BP: (124-153)/(48-107) 153/64 (01/21 0435) SpO2:  [84 %-97 %] 92 % (01/21 0910)  Intake/Output from previous day: 01/20 0701 - 01/21 0700 In: 1314.8 [P.O.:720; I.V.:268.8; Blood:326] Out: 1475 [Urine:1475] Intake/Output this shift: Total I/O In: -  Out: 200 [Urine:200]  Physical Exam:  General: Alert and oriented. CV: RRR GI: Soft, Nondistended. Extremities: Nontender, no erythema, no edema.  Lab Results:  Recent Labs  10/28/16 1602 10/29/16 0359 10/30/16 0524  HGB 8.2* 8.1* 7.4*  HCT 25.5* 24.9* 22.7*          Recent Labs  10/28/16 1602 10/29/16 0359 10/30/16 0524  CREATININE 2.46* 2.59* 2.57*           Results for orders placed or performed during the hospital encounter of 10/28/16 (from the past 24 hour(s))  Prepare fresh frozen plasma     Status: None (Preliminary result)   Collection Time: 10/29/16 10:43 AM  Result Value Ref Range   ISSUE DATE / TIME [id number]    Blood Product Unit Number [id number]    Unit Type and Rh 7300    Blood Product Expiration Date [id number]   CK     Status: None   Collection Time: 10/29/16 10:43 AM  Result Value Ref Range   Total CK 114 38 - 234 U/L  Respiratory Panel by PCR     Status: None   Collection Time: 10/29/16 11:03 AM  Result Value Ref Range   Adenovirus NOT DETECTED NOT DETECTED   Coronavirus 229E NOT DETECTED NOT DETECTED   Coronavirus HKU1 NOT DETECTED NOT DETECTED   Coronavirus NL63 NOT DETECTED NOT DETECTED   Coronavirus OC43 NOT DETECTED NOT DETECTED   Metapneumovirus NOT DETECTED NOT DETECTED   Rhinovirus / Enterovirus NOT DETECTED NOT DETECTED   Influenza A NOT DETECTED NOT DETECTED   Influenza B NOT DETECTED NOT DETECTED   Parainfluenza Virus 1 NOT DETECTED NOT DETECTED   Parainfluenza Virus 2 NOT DETECTED NOT DETECTED   Parainfluenza Virus 3 NOT DETECTED NOT DETECTED   Parainfluenza Virus 4 NOT DETECTED NOT DETECTED   Respiratory Syncytial Virus NOT DETECTED NOT DETECTED   Bordetella pertussis NOT DETECTED NOT DETECTED   Chlamydophila pneumoniae NOT DETECTED NOT DETECTED   Mycoplasma pneumoniae NOT DETECTED NOT DETECTED  MRSA PCR Screening     Status: None   Collection Time: 10/29/16 11:03 AM  Result Value Ref Range   MRSA by PCR NEGATIVE NEGATIVE  Culture, sputum-assessment     Status: None   Collection Time: 10/29/16 11:30 AM  Result Value Ref Range   Specimen Description SPUTUM    Special Requests Normal    Sputum evaluation THIS SPECIMEN IS ACCEPTABLE FOR SPUTUM CULTURE    Report Status 10/29/2016 FINAL   Culture, respiratory (NON-Expectorated)     Status: None (Preliminary result)   Collection Time: 10/29/16 11:30 AM  Result Value Ref Range   Specimen Description SPUTUM    Special Requests Normal Reflexed from [id number]    Gram Stain      FEW WBC PRESENT, PREDOMINANTLY PMN MODERATE SQUAMOUS EPITHELIAL CELLS PRESENT MODERATE GRAM POSITIVE COCCI IN PAIRS FEW GRAM VARIABLE ROD RARE GRAM NEGATIVE COCCI Performed at [facility], [place] [address] [place],  [address]    Culture PENDING    Report Status PENDING   Type and screen     Status: None (Preliminary result)   Collection Time: 10/29/16 12:15 PM  Result Value Ref Range   Blood Product Unit Number [id number]    Unit Type and Rh 1700    Blood Product Expiration Date [id number]    Blood Product Unit Number [id number]    Unit Type and Rh 1700    Blood Product Expiration Date [id number]   ABO/Rh     Status: None   Collection Time: 10/29/16 12:20 PM  Result Value Ref Range   ABO/RH(D) B NEG   Protime-INR     Status: Abnormal   Collection  Time: 10/30/16  5:24 AM  Result Value Ref Range   Prothrombin Time 25.7 (H) 11.4 - 15.2 seconds   INR 2.30   CBC     Status: Abnormal   Collection Time: 10/30/16  5:24 AM  Result Value Ref Range   WBC 17.2 (H) 4.0 - 10.5 K/uL   RBC 2.52 (L) 3.87 - 5.11 MIL/uL   Hemoglobin 7.4 (L) 12.0 - 15.0 g/dL   HCT 22.7 (L) 36.0 - 46.0 %   MCV 90.1 78.0 - 100.0 fL   MCH 29.4 26.0 - 34.0 pg   MCHC 32.6 30.0 - 36.0 g/dL   RDW 16.8 (H) 11.5 - 15.5 %   Platelets 311 150 - 400 K/uL  Basic metabolic panel     Status: Abnormal   Collection Time: 10/30/16  5:24 AM  Result Value Ref Range   Sodium 138 135 - 145 mmol/L   Potassium 4.1 3.5 - 5.1 mmol/L   Chloride 103 101 - 111 mmol/L   CO2 24 22 - 32 mmol/L   Glucose, Bld 163 (H) 65 - 99 mg/dL   BUN 40 (H) 6 - 20 mg/dL   Creatinine, Ser 2.57 (H) 0.44 - 1.00 mg/dL   Calcium 8.5 (L) 8.9 - 10.3 mg/dL   GFR calc non Af Amer 17 (L) >60 mL/min   GFR calc Af Amer 20 (L) >60 mL/min   Anion gap 11 5 - 15  Brain natriuretic peptide     Status: Abnormal   Collection Time: 10/30/16  5:29 AM  Result Value Ref Range   B Natriuretic Peptide 963.1 (H) 0.0 - 100.0 pg/mL  Prepare RBC     Status: None   Collection Time: 10/30/16  8:29 AM  Result Value Ref Range   Order Confirmation ORDER PROCESSED BY BLOOD BANK     Assessment/Plan:  76 yo female with mild gross hematuria, AKI, and anemia. There does not appear to be an obstructive process responsible for her acute renal failure based on ultrasound imaging but may have mild right UPJ obstruction causing delayed drainage on her right kidney on PET imaging. Given the stability of her creatinine will monitor for another 24 hours before considering CT imaging. Hematuria had improved and was very mild even when present. Suspect that this is not a significant contributor to her anemia.  -  If creatinine does not improve in next 24 hours, will consider CT abd/pelvis without contrast sooner. Otherwise, will arrange for  outpatient CT abd/pelvis with contrast. - We will arrange for cystoscopy in urology clinic  I have seen the patient and reviewed/agree with the above assessment and plan.

## 2016-10-31 ENCOUNTER — Encounter: Payer: Self-pay | Admitting: *Deleted

## 2016-10-31 ENCOUNTER — Inpatient Hospital Stay (HOSPITAL_COMMUNITY): Payer: PPO

## 2016-10-31 DIAGNOSIS — J441 Chronic obstructive pulmonary disease with (acute) exacerbation: Secondary | ICD-10-CM

## 2016-10-31 LAB — COMPREHENSIVE METABOLIC PANEL
ALBUMIN: 2.6 g/dL — AB (ref 3.5–5.0)
ALT: 24 U/L (ref 14–54)
AST: 39 U/L (ref 15–41)
Alkaline Phosphatase: 81 U/L (ref 38–126)
Anion gap: 11 (ref 5–15)
BUN: 45 mg/dL — AB (ref 6–20)
CALCIUM: 9 mg/dL (ref 8.9–10.3)
CHLORIDE: 99 mmol/L — AB (ref 101–111)
CO2: 27 mmol/L (ref 22–32)
CREATININE: 2.52 mg/dL — AB (ref 0.44–1.00)
GFR calc Af Amer: 20 mL/min — ABNORMAL LOW (ref >60)
GFR calc non Af Amer: 17 mL/min — ABNORMAL LOW (ref >60)
GLUCOSE: 175 mg/dL — AB (ref 65–99)
Potassium: 3.5 mmol/L (ref 3.5–5.1)
SODIUM: 137 mmol/L (ref 135–145)
Total Bilirubin: 1 mg/dL (ref 0.3–1.2)
Total Protein: 6.8 g/dL (ref 6.5–8.1)

## 2016-10-31 LAB — CULTURE, RESPIRATORY W GRAM STAIN
Culture: NORMAL
Special Requests: NORMAL

## 2016-10-31 LAB — CBC
HCT: 25.5 % — ABNORMAL LOW (ref 36.0–46.0)
Hemoglobin: 8.6 g/dL — ABNORMAL LOW (ref 12.0–15.0)
MCH: 29.3 pg (ref 26.0–34.0)
MCHC: 33.7 g/dL (ref 30.0–36.0)
MCV: 86.7 fL (ref 78.0–100.0)
PLATELETS: 293 10*3/uL (ref 150–400)
RBC: 2.94 MIL/uL — ABNORMAL LOW (ref 3.87–5.11)
RDW: 16.8 % — AB (ref 11.5–15.5)
WBC: 17.7 10*3/uL — ABNORMAL HIGH (ref 4.0–10.5)

## 2016-10-31 LAB — CULTURE, RESPIRATORY

## 2016-10-31 LAB — PROTIME-INR
INR: 2.09
Prothrombin Time: 23.8 seconds — ABNORMAL HIGH (ref 11.4–15.2)

## 2016-10-31 LAB — TROPONIN I
Troponin I: 0.07 ng/mL (ref <0.03)
Troponin I: 0.06 ng/mL (ref <0.03)

## 2016-10-31 LAB — PREPARE FRESH FROZEN PLASMA: UNIT DIVISION: 0

## 2016-10-31 LAB — HEMOGLOBIN A1C
Hgb A1c MFr Bld: 5.7 % — ABNORMAL HIGH (ref 4.8–5.6)
Mean Plasma Glucose: 117 mg/dL

## 2016-10-31 LAB — PROCALCITONIN: PROCALCITONIN: 0.35 ng/mL

## 2016-10-31 MED ORDER — POTASSIUM CHLORIDE CRYS ER 20 MEQ PO TBCR
40.0000 meq | EXTENDED_RELEASE_TABLET | Freq: Once | ORAL | Status: AC
  Administered 2016-10-31: 40 meq via ORAL
  Filled 2016-10-31: qty 2

## 2016-10-31 MED ORDER — IOPAMIDOL (ISOVUE-300) INJECTION 61%
INTRAVENOUS | Status: AC
  Administered 2016-10-31: 15 mL
  Filled 2016-10-31: qty 30

## 2016-10-31 MED ORDER — IOPAMIDOL (ISOVUE-300) INJECTION 61%
30.0000 mL | Freq: Once | INTRAVENOUS | Status: AC | PRN
  Administered 2016-10-31: 30 mL via ORAL

## 2016-10-31 MED ORDER — FUROSEMIDE 10 MG/ML IJ SOLN
40.0000 mg | Freq: Once | INTRAMUSCULAR | Status: AC
  Administered 2016-10-31: 40 mg via INTRAVENOUS
  Filled 2016-10-31: qty 4

## 2016-10-31 MED ORDER — HYDRALAZINE HCL 20 MG/ML IJ SOLN
10.0000 mg | Freq: Four times a day (QID) | INTRAMUSCULAR | Status: DC | PRN
  Administered 2016-10-31 (×2): 10 mg via INTRAVENOUS
  Filled 2016-10-31 (×2): qty 1

## 2016-10-31 MED ORDER — HYDRALAZINE HCL 20 MG/ML IJ SOLN
5.0000 mg | Freq: Once | INTRAMUSCULAR | Status: AC
  Administered 2016-10-31: 5 mg via INTRAVENOUS
  Filled 2016-10-31: qty 1

## 2016-10-31 NOTE — Progress Notes (Signed)
PULMONARY / CRITICAL CARE MEDICINE   Name: Renee Vance MRN: [id number] DOB: 03-28-1941    ADMISSION DATE:  10/28/2016 CONSULTATION DATE:  10/30/2016  REFERRING MD:  Dr. 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  -seen by her primary care doctor. Was given azithromycin on January 17. She continued to worsen prompting ED visit.  -In ER serum creatinine 2.46 and hemoglobin 8.2. INR was 4.50. Urinalysis showed gross and microscopic hematuria.  She was given 1 u FFP. She was given IV antibiotics for possible pneumonia. She was admitted to the floors. -1/21 PCCM was consulted for further recommendations.  ABG with 7.5/33/55 on 10L HFNC   SUBJECTIVE:  Feels better   VITAL SIGNS: BP (!) 185/68 (BP Location: Left Arm)   Pulse 74   Temp 97.6 F (36.4 C) (Oral)   Resp (!) 27   Ht '5\' 4"'$  (1.626 m)   Wt 151 lb 14.4 oz (68.9 kg)   SpO2 100%   BMI 26.07 kg/m   HEMODYNAMICS:    VENTILATOR SETTINGS: Vent Mode: BIPAP FiO2 (%):  [30 %-97 %] 30 % Set Rate:  [8 bmp] 8 bmp PEEP:  [5 cmH20] 5 cmH20  INTAKE / OUTPUT:  Intake/Output Summary (Last 24 hours) at 10/31/16 8366 Last data filed at 10/30/16 2327  Gross per 24 hour  Intake              625 ml  Output              100 ml  Net              525 ml   General appearance: 76 Year old female, well nourished,  Conversant, + accessory use   Eyes: anicteric, moist conjunctivae; PERRL, EOMI bilaterally. Mouth:  membranes and no mucosal ulcerations; normal hard and soft palate Neck: Trachea midline; neck supple, no JVD Lungs/chest: crackles both bases,  + accessory use w/ talking and exertion  CV: RRR, no MRGs  Abdomen: Soft, non-tender; no masses or HSM Extremities: No  peripheral edema or extremity lymphadenopathy Skin: Normal temperature, turgor and texture; no rash, ulcers or subcutaneous nodules Psych: Appropriate affect, alert and oriented to person, place and time LABS:  BMET  Recent Labs Lab 10/29/16 0359 10/30/16 0524 10/31/16 0203  NA 137 138 137  K 4.5 4.1 3.5  CL 103 103 99*  CO2 '25 24 27  '$ BUN 34* 40* 45*  CREATININE 2.59* 2.57* 2.52*  GLUCOSE 126* 163* 175*    Electrolytes  Recent Labs Lab 10/29/16 0359 10/30/16 0524 10/31/16 0203  CALCIUM 8.5* 8.5* 9.0    CBC  Recent Labs Lab 10/29/16 0359 10/30/16 0524 10/31/16 0203  WBC 5.1 17.2* 17.7*  HGB 8.1* 7.4* 8.6*  HCT 24.9* 22.7* 25.5*  PLT 288 311 293    Coag's  Recent Labs Lab 10/29/16 0702 10/30/16 0524 10/31/16 0203  INR 4.50* 2.30 2.09    Sepsis Markers  Recent Labs Lab 10/30/16 1204 10/31/16 0203  PROCALCITON 0.36 0.35    ABG  Recent Labs Lab 10/30/16 1300  PHART 7.525*  PCO2ART 33.1  PO2ART 55.3*    Liver Enzymes  Recent Labs Lab 10/31/16 0203  AST 39  ALT 24  ALKPHOS 81  BILITOT 1.0  ALBUMIN 2.6*  Cardiac Enzymes  Recent Labs Lab 10/30/16 1944 10/31/16 0203 10/31/16 0747  TROPONINI 0.09* 0.07* 0.06*    Glucose No results for input(s): GLUCAP in the last 168 hours.  Imaging Dg Chest Port 1 View  Result Date: 10/31/2016 CLINICAL DATA:  Acute respiratory failure with hypoxia. History of lung malignancy, COPD, current smoker. EXAM: PORTABLE CHEST 1 VIEW COMPARISON:  Portable chest x-ray of October 30, 2016 FINDINGS: The lungs are well-expanded. The interstitial markings remain increased diffusely. There remains partial obscuration of the left hemidiaphragm. A small left pleural effusion is suspected. The heart is top-normal in size. The pulmonary vascularity is indistinct but not clearly engorged. IMPRESSION: Persistent diffuse interstitial infiltrates likely reflecting pneumonia. Probable small left pleural effusion.  No significant change since yesterday's study. Electronically Signed   By: David  Martinique M.D.   On: 10/31/2016 07:03  diffuse interstitial infiltrates; mild improvement in aeration  STUDIES:  Echo 3/22 N EF, diastolic dysfxn  CULTURES: Blood 1/20 > MRSA 1/20 (-) Sputum 1/20: >> RVP 1/20 > (-) Urine 1/20 >  ANTIBIOTICS: Rocephin 1/20 > 1/20 Cefepime 1/21 > Azithro 1/20 > 1/20  SIGNIFICANT EVENTS: 1/20 admit for dyspnea,cough, wheezing, hematuria. Symptoms have been going on for 3 weeks. 1/21 transferred to stepdown unit. BiPAP started.  LINES/TUBES:    ASSESSMENT / PLAN: Acute on chronic Hypoxic respiratory failure in setting of diffuse pulmonary infiltrates. Favor pulmonary edema +/- CAP (NOS). Also consider TRALI. 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 Pt had decided against surgery  Plan F/u mohammad   H/o CAD and PAF Mild troponin elevation  Diastolic HF Plan Currently off coumadin  Trend INR  Cont rate control meds   Acute on Chronic renal failure CKD 3 Plan Cont lasix Strict I&O Renal dose meds  supra therapeutic INR from coumadin and abx Plan Per primary service   Anemia  Plan Per IM service    Discussion Treating as PNA c/b pulmonary edema. Suspect decompensated diastolic dysfxn. Could consider TRALI. Still would not change current rx as seems to be working.  Cont lasix as BUN/cr and BP tolerate Cont abx F/u cxr in am Not convinced there is much in the way of AECOPD so will taper steroids rapidly.     PErick ColaceACNP-BC LFranklinPager # [phone] # [phone] no answer  Attending Note  76year old female with stage 2B squamous cell ca who presents with hypoxemic respiratory failure requiring BiPAP overnight now on high flow and increased work of breathing noted on exam.  I reviewed CXR myself, pulmonary edema noted.  Discussed with PCCM NP.    Acute respiratory failure:  - Change BIPAP to PRN  - Continue HFNC  COPD:  - Taper steroids.  - Continue breathing treatment  HCAP:  - Continue cefepime.  - F/U on cultures.  Acute pulmonary edema  - Lasix   - BMET  - Replace electrolytes  Patient seen and examined, agree with above note.  I dictated the care and orders written for this patient under my direction.  WLloyd Huger  Nelda Marseille, [place]  10/31/2016, 9:49 AM

## 2016-10-31 NOTE — Progress Notes (Signed)
Right hand remains very swollen and bluish  From iv infiltration from yesterday.Ice bag applied.

## 2016-10-31 NOTE — Progress Notes (Signed)
Oncology Nurse Navigator Documentation  Oncology Nurse Navigator Flowsheets 10/31/2016  Navigator Location [facility]  Navigator Encounter Type Other/I followed up with Renee Vance today.  She is in the ICU and being [place] for pneumonia and CHF.  She is in good spirits.  She asked that I contact [facility] to cancel appt.  I did.  I updated Dr. Julien Nordmann on patient condition.   Patient Visit Type Inpatient  Treatment Phase Follow-up  Barriers/Navigation Needs Coordination of Care;Education  Education Other  Interventions Coordination of Care;Education  Coordination of Care Appts  Education Method Verbal  Acuity Level 2  Acuity Level 2 Educational needs;Other  Time Spent with Patient 45

## 2016-10-31 NOTE — Progress Notes (Signed)
Laurey Arrow BabcockNP states to leave foley in today.

## 2016-10-31 NOTE — Progress Notes (Signed)
Subjective: Good UOP with stable creatinine at 2.5. No significant hematuria overnight.   Objective: Vital signs in last 24 hours: Temp:  [97.6 F (36.4 C)-99.1 F (37.3 C)] 97.6 F (36.4 C) (01/22 0800) Pulse Rate:  [74-97] 74 (01/22 0307) Resp:  [20-38] 28 (01/22 1005) BP: (137-191)/(51-83) 149/51 (01/22 1000) SpO2:  [86 %-100 %] 97 % (01/22 1127) FiO2 (%):  [30 %-97 %] 30 % (01/22 0307)  Intake/Output from previous day: 01/21 0701 - 01/22 0700 In: 625 [P.O.:240; Blood:335; IV Piggyback:50] Out: 625 [Urine:625] Intake/Output this shift: Total I/O In: 540 [P.O.:540] Out: 200 [Urine:200]  Physical Exam:  General: Alert and oriented. CV: RRR GI: Soft, Nondistended. GU: foley with amber colored urine Extremities: Nontender, no erythema, no edema.  Lab Results:  Recent Labs  10/29/16 0359 10/30/16 0524 10/31/16 0203  HGB 8.1* 7.4* 8.6*  HCT 24.9* 22.7* 25.5*           Recent Labs  10/29/16 0359 10/30/16 0524 10/31/16 0203  CREATININE 2.59* 2.57* 2.52*           Results for orders placed or performed during the hospital encounter of 10/28/16 (from the past 24 hour(s))  Procalcitonin - Baseline     Status: None   Collection Time: 10/30/16 12:04 PM  Result Value Ref Range   Procalcitonin 0.36 ng/mL  Hemoglobin A1c     Status: Abnormal   Collection Time: 10/30/16 12:04 PM  Result Value Ref Range   Hgb A1c MFr Bld 5.7 (H) 4.8 - 5.6 %   Mean Plasma Glucose 117 mg/dL  T4, free     Status: Abnormal   Collection Time: 10/30/16 12:04 PM  Result Value Ref Range   Free T4 1.56 (H) 0.61 - 1.12 ng/dL  Blood gas, arterial     Status: Abnormal   Collection Time: 10/30/16  1:00 PM  Result Value Ref Range   O2 Content 10.0 L/min   Delivery systems NASAL CANNULA    pH, Arterial 7.525 (H) 7.350 - 7.450   pCO2 arterial 33.1 32.0 - 48.0 mmHg   pO2, Arterial 55.3 (L) 83.0 - 108.0 mmHg   Bicarbonate 27.2 20.0 - 28.0 mmol/L   Acid-Base Excess 4.6 (H) 0.0 - 2.0 mmol/L    O2 Saturation 87.8 %   Patient temperature 98.6    Collection site LEFT RADIAL    Drawn by [id number]    Sample type ARTERIAL DRAW    Allens test (pass/fail) PASS PASS  Troponin I     Status: Abnormal   Collection Time: 10/30/16  7:44 PM  Result Value Ref Range   Troponin I 0.09 (HH) <0.03 ng/mL  Troponin I     Status: Abnormal   Collection Time: 10/31/16  2:03 AM  Result Value Ref Range   Troponin I 0.07 (HH) <0.03 ng/mL  Protime-INR     Status: Abnormal   Collection Time: 10/31/16  2:03 AM  Result Value Ref Range   Prothrombin Time 23.8 (H) 11.4 - 15.2 seconds   INR 2.09   Comprehensive metabolic panel     Status: Abnormal   Collection Time: 10/31/16  2:03 AM  Result Value Ref Range   Sodium 137 135 - 145 mmol/L   Potassium 3.5 3.5 - 5.1 mmol/L   Chloride 99 (L) 101 - 111 mmol/L   CO2 27 22 - 32 mmol/L   Glucose, Bld 175 (H) 65 - 99 mg/dL   BUN 45 (H) 6 - 20 mg/dL   Creatinine, Ser 2.52 (H) 0.44 -  1.00 mg/dL   Calcium 9.0 8.9 - 10.3 mg/dL   Total Protein 6.8 6.5 - 8.1 g/dL   Albumin 2.6 (L) 3.5 - 5.0 g/dL   AST 39 15 - 41 U/L   ALT 24 14 - 54 U/L   Alkaline Phosphatase 81 38 - 126 U/L   Total Bilirubin 1.0 0.3 - 1.2 mg/dL   GFR calc non Af Amer 17 (L) >60 mL/min   GFR calc Af Amer 20 (L) >60 mL/min   Anion gap 11 5 - 15  CBC     Status: Abnormal   Collection Time: 10/31/16  2:03 AM  Result Value Ref Range   WBC 17.7 (H) 4.0 - 10.5 K/uL   RBC 2.94 (L) 3.87 - 5.11 MIL/uL   Hemoglobin 8.6 (L) 12.0 - 15.0 g/dL   HCT 25.5 (L) 36.0 - 46.0 %   MCV 86.7 78.0 - 100.0 fL   MCH 29.3 26.0 - 34.0 pg   MCHC 33.7 30.0 - 36.0 g/dL   RDW 16.8 (H) 11.5 - 15.5 %   Platelets 293 150 - 400 K/uL  Procalcitonin     Status: None   Collection Time: 10/31/16  2:03 AM  Result Value Ref Range   Procalcitonin 0.35 ng/mL  Troponin I     Status: Abnormal   Collection Time: 10/31/16  7:47 AM  Result Value Ref Range   Troponin I 0.06 (HH) <0.03 ng/mL    Assessment/Plan:  76 yo female  with mild gross hematuria, AKI, and anemia. There does not appear to be an obstructive process responsible for her acute renal failure based on ultrasound imaging but may have mild right UPJ obstruction causing delayed drainage on her right kidney on PET imaging. Given the stability of her creatinine will monitor for another 24 hours before considering CT imaging. Hematuria had improved and was very mild even when present. Suspect that this is not a significant contributor to her anemia.  -  Given lack of improvement in creatinine, will order CT abd/pelvis without contrast. -  We will arrange for cystoscopy in urology clinic

## 2016-10-31 NOTE — Progress Notes (Signed)
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 The patient went to see her primary care provider on 10/18/2016. Apparently, the patient was diagnosed with influenza and started on oseltamivir. She did not have significant improvement and continued to have shortness of breath and coughing with yellow sputum. Her shortness of breath has progressed to the point of having dyspnea with minimal exertion. As a result presented to the hospital for further evaluation. The patient called her primary care provider's office back, and was started on azithromycin on 10/26/2016. She has had low-grade fevers at home running between 99.0-100.60F. She denies any nausea, vomiting, diarrhea, abdominal pain, dysuria. There is been no hemoptysis. However, the patient has had gross hematuria for the past 2-3 days. She states that she has had intermittent hematuria since the beginning of December 2017. She denies any over-the-counter NSAIDs. Upon presentation to the emergency department, the patient was noted to have serum creatinine 2.46 and hemoglobin 8.2. INR was 4.50. Urinalysis showed gross and microscopic hematuria. Urology was consulted. In the early morning of 10/30/2016, the patient developed respiratory distress. Chest x-ray showed increase in left lung opacification as well as increased opacification in the right lung periphery. The patient was transferred to stepdown, and PCCM was consulted.  Assessment/Plan: Acute respiratory failure with hypoxia -Secondary to pneumonia -early am 10/30/16--pt developed respiratory distress--pulm edema--less likely TRALI vs ARDS  -repeat lasix IV 10/30/16--transferred  to stepdown -appreciate PCCM -placed on BiPAP 1/21-->now on HFNC -Continue antibiotics -Continue bronchodilators -echo--EF 60-65%, grade 2 DD, no WMA, PASP 28  HCAP -Given her relative immunosuppressed state, will treat pt as HCAP -continue vancomycin and cefepime pending culture data -MRSA screen--neg-->d/c Vanco -viral respiratory panel--negative -10/28/2016 chest x-ray--left perihilar infiltrate; trace left effusion -10/31/16 CXR--diffuse L-lung infiltrates, R-lung peripheral opacities -check procalcitonin--0.35   Acute blood loss anemia -Hemoglobin baseline approx 10 -presenting Hgb 8.2-->7.4 -d/c coumadin for now -drop partly due to dilution due to IVF initially -one unit of PRBC given 10/30/16  Hematuria -urology consult appreciated-->ultimately need cystoscopy and CT abd -cannot give contrast due to acute on chronic renal failure -pt had microscopic hematuria dating back to Ua on 01/20/15  Hemoptysis -very small amount, mixed with sputum  Acute on chronic renal failure--CKD 3 -likely hemodynamic changes -renal US--mild right collecting system fullness without hydronephrosis -may need renal consult if no improvement -check CK--114 -d/c losartan  Coagulopathy -due to coumadin--holding presently due to hematuria and ABLA -now with small amount of hemoptysis -supratherapeutic INR likely due recent use of azithromycin -one unit FFP given 10/29/16 -INR 4.50-->2.30  Paroxysmal atrial fibrillation -Hold coumadin due to hematuria and ABLA -CHADS-VASc = 5 -presently in sinus  HTN -d/c amlodipine -continue metoprolol succinate  COPD -stable without wheezing -continue bronchodilators  Squamous cell carcinoma Lung--stage IIB -last chemo 08/01/16 -follows Dr. Julien Nordmann -09/19/16 CT chest--regression in size of Left infrahilar mass -wants to defer LUL lobectomy  Hyperlipidemia -continue statin     Disposition Plan: remain stepdown Family  Communication: spouse updated at bedside 1/22--Total time spent 35 minutes. Greater than 50% spent face to face counseling and coordinating care.   Consultants: urology, PCCM  Code Status: FULL--confirmed with patient  DVT Prophylaxis: coumadin   Procedures: As Listed in  Progress Note Above  Antibiotics: vanco 10/28/16>>>1/20 Cefepime 10/28/16>>>     Subjective: Patient is breathing better than yesterday but still dyspneic at rest. Denies any fevers, chills, chest pain, nausea, vomiting, diarrhea, abdominal pain. Has a small amount of hemoptysis. Denies any headache, neck pain.  Objective: Vitals:   10/31/16 1000 10/31/16 1005 10/31/16 1127 10/31/16 1200  BP: (!) 149/51   (!) 157/42  Pulse:      Resp: (!) 21 (!) 28  (!) 33  Temp:      TempSrc:      SpO2: (!) 86% 94% 97% 92%  Weight:      Height:        Intake/Output Summary (Last 24 hours) at 10/31/16 1309 Last data filed at 10/31/16 1000  Gross per 24 hour  Intake             1165 ml  Output              300 ml  Net              865 ml   Weight change:  Exam:   General:  Pt is alert, follows commands appropriately, not in acute distress  HEENT: No icterus, No thrush, No neck mass, [place]/AT  Cardiovascular: RRR, S1/S2, no rubs, no gallops  Respiratory: Bilateral crackles. Good air movement.  Abdomen: Soft/+BS, non tender, non distended, no guarding  Extremities: No edema, No lymphangitis, No petechiae, No rashes, no synovitis   Data Reviewed: I have personally reviewed following labs and imaging studies Basic Metabolic Panel:  Recent Labs Lab 10/28/16 1602 10/29/16 0359 10/30/16 0524 10/31/16 0203  NA 137 137 138 137  K 4.1 4.5 4.1 3.5  CL 101 103 103 99*  CO2 '26 25 24 27  '$ GLUCOSE 101* 126* 163* 175*  BUN 31* 34* 40* 45*  CREATININE 2.46* 2.59* 2.57* 2.52*  CALCIUM 8.7* 8.5* 8.5* 9.0   Liver Function Tests:  Recent Labs Lab 10/31/16 0203  AST 39  ALT 24  ALKPHOS 81    BILITOT 1.0  PROT 6.8  ALBUMIN 2.6*   No results for input(s): LIPASE, AMYLASE in the last 168 hours. No results for input(s): AMMONIA in the last 168 hours. Coagulation Profile:  Recent Labs Lab 10/29/16 0702 10/30/16 0524 10/31/16 0203  INR 4.50* 2.30 2.09   CBC:  Recent Labs Lab 10/28/16 1602 10/29/16 0359 10/30/16 0524 10/31/16 0203  WBC 7.9 5.1 17.2* 17.7*  NEUTROABS 6.6 4.8  --   --   HGB 8.2* 8.1* 7.4* 8.6*  HCT 25.5* 24.9* 22.7* 25.5*  MCV 87.9 89.6 90.1 86.7  PLT 285 288 311 293   Cardiac Enzymes:  Recent Labs Lab 10/29/16 1043 10/30/16 1944 10/31/16 0203 10/31/16 0747  CKTOTAL 114  --   --   --   TROPONINI  --  0.09* 0.07* 0.06*   BNP: Invalid input(s): POCBNP CBG: No results for input(s): GLUCAP in the last 168 hours. HbA1C:  Recent Labs  10/30/16 1204  HGBA1C 5.7*   Urine analysis:    Component Value Date/Time   COLORURINE YELLOW 10/28/2016 1649   APPEARANCEUR CLEAR 10/28/2016 1649   LABSPEC 1.004 (L) 10/28/2016 1649   PHURINE 5.0 10/28/2016 1649   GLUCOSEU NEGATIVE 10/28/2016 1649   HGBUR LARGE (A) 10/28/2016 1649   BILIRUBINUR NEGATIVE 10/28/2016 1649   KETONESUR NEGATIVE 10/28/2016 1649   PROTEINUR 30 (A) 10/28/2016 1649   NITRITE NEGATIVE 10/28/2016 1649   LEUKOCYTESUR TRACE (A) 10/28/2016 1649   Sepsis  Labs: '@[id number]'$ (procalcitonin:4,lacticidven:4) ) Recent Results (from the past 240 hour(s))  Culture, Urine     Status: None   Collection Time: 10/29/16  1:32 AM  Result Value Ref Range Status   Specimen Description URINE, CLEAN CATCH  Final   Special Requests NONE  Final   Culture   Final    NO GROWTH Performed at [facility], [address] [place], [place] [address]    Report Status 10/30/2016 FINAL  Final  Culture, blood (routine x 2) Call MD if unable to obtain prior to antibiotics being given     Status: None (Preliminary result)   Collection Time: 10/29/16  3:59 AM  Result Value Ref Range Status    Specimen Description BLOOD BLOOD LEFT FOREARM  Final   Special Requests BOTTLES DRAWN AEROBIC ONLY 5ML  Final   Culture   Final    NO GROWTH 1 DAY Performed at [facility], [place] [address] [place], [place] [address]    Report Status PENDING  Incomplete  Culture, blood (routine x 2) Call MD if unable to obtain prior to antibiotics being given     Status: None (Preliminary result)   Collection Time: 10/29/16  3:59 AM  Result Value Ref Range Status   Specimen Description BLOOD LEFT ANTECUBITAL  Final   Special Requests BOTTLES DRAWN AEROBIC ONLY 5ML  Final   Culture   Final    NO GROWTH 1 DAY Performed at [facility], [place] [address], [place], [place] [address]    Report Status PENDING  Incomplete  Respiratory Panel by PCR     Status: None   Collection Time: 10/29/16 11:03 AM  Result Value Ref Range Status   Adenovirus NOT DETECTED NOT DETECTED Final   Coronavirus 229E NOT DETECTED NOT DETECTED Final   Coronavirus HKU1 NOT DETECTED NOT DETECTED Final   Coronavirus NL63 NOT DETECTED NOT DETECTED Final   Coronavirus OC43 NOT DETECTED NOT DETECTED Final   Metapneumovirus NOT DETECTED NOT DETECTED Final   Rhinovirus / Enterovirus NOT DETECTED NOT DETECTED Final   Influenza A NOT DETECTED NOT DETECTED Final   Influenza B NOT DETECTED NOT DETECTED Final   Parainfluenza Virus 1 NOT DETECTED NOT DETECTED Final   Parainfluenza Virus 2 NOT DETECTED NOT DETECTED Final   Parainfluenza Virus 3 NOT DETECTED NOT DETECTED Final   Parainfluenza Virus 4 NOT DETECTED NOT DETECTED Final   Respiratory Syncytial Virus NOT DETECTED NOT DETECTED Final   Bordetella pertussis NOT DETECTED NOT DETECTED Final   Chlamydophila pneumoniae NOT DETECTED NOT DETECTED Final   Mycoplasma pneumoniae NOT DETECTED NOT DETECTED Final    Comment: Performed at [facility], [place] [address] [place], [place] [address]  MRSA PCR Screening     Status: None   Collection Time: 10/29/16 11:03 AM  Result Value  Ref Range Status   MRSA by PCR NEGATIVE NEGATIVE Final    Comment:        The GeneXpert MRSA Assay (FDA approved for NASAL specimens only), is one component of a comprehensive MRSA colonization surveillance program. It is not intended to diagnose MRSA infection nor to guide or monitor treatment for MRSA infections.   Culture, sputum-assessment     Status: None   Collection Time: 10/29/16 11:30 AM  Result Value Ref Range Status   Specimen Description SPUTUM  Final   Special Requests Normal  Final   Sputum evaluation THIS SPECIMEN IS ACCEPTABLE FOR SPUTUM CULTURE  Final  Report Status 10/29/2016 FINAL  Final  Culture, respiratory (NON-Expectorated)     Status: None   Collection Time: 10/29/16 11:30 AM  Result Value Ref Range Status   Specimen Description SPUTUM  Final   Special Requests Normal Reflexed from [id number]  Final   Gram Stain   Final    FEW WBC PRESENT, PREDOMINANTLY PMN MODERATE SQUAMOUS EPITHELIAL CELLS PRESENT MODERATE GRAM POSITIVE COCCI IN PAIRS FEW GRAM VARIABLE ROD RARE GRAM NEGATIVE COCCI    Culture   Final    Consistent with normal respiratory flora. Performed at [facility], [place] [address], [place], [place] [address]    Report Status 10/31/2016 FINAL  Final     Scheduled Meds: . atorvastatin  80 mg Oral Daily  . budesonide (PULMICORT) nebulizer solution  0.5 mg Nebulization BID  . ceFEPime (MAXIPIME) IV  1 g Intravenous QHS  . dextromethorphan  60 mg Oral BID  . feeding supplement (ENSURE ENLIVE)  237 mL Oral BID BM  . furosemide  40 mg Intravenous See admin instructions  . ipratropium-albuterol  3 mL Nebulization QID  . levothyroxine  125 mcg Oral QAC breakfast  . methylPREDNISolone (SOLU-MEDROL) injection  40 mg Intravenous Q12H  . metoprolol succinate  50 mg Oral BID   Continuous Infusions:  Procedures/Studies: Dg Chest 2 View  Result Date: 10/28/2016 CLINICAL DATA:  Dyspnea, cough, history of COPD and lung cancer. Ex-smoker. EXAM:  CHEST  2 VIEW COMPARISON:  CT from 09/19/2016 and CXR 05/29/2016 FINDINGS: The heart is top-normal in size. There is aortic atherosclerosis without aneurysm. Diffuse interstitial prominence is noted bilaterally left greater than right with superimposed airspace opacities about the left hilum. Findings may reflect pneumonia superimposed on interstitial lung disease, the interstitial prominence possibly related to bronchitis, post treatment change or fibrosis. Slight volume loss with tenting of left inferior pulmonary ligament. Lesser degree of interstitial markings at the right costophrenic angle and right upper lobe. There appears be a trace left effusion. No pneumothorax. No acute osseous abnormality. IMPRESSION: Findings suspicious for left-sided perihilar pneumonia superimposed on interstitial lung disease. Follow-up therefore recommended to ensure clearance. Followup PA and lateral chest X-ray is recommended in 3-4 weeks following trial of antibiotic therapy to ensure resolution. Trace left effusion. Electronically Signed   By: Ashley Royalty M.D.   On: 10/28/2016 15:40   US Renal  Result Date: 10/29/2016 CLINICAL DATA:  Acute on chronic renal failure. History of squamous cell carcinoma of the left lung. EXAM: RENAL / URINARY TRACT ULTRASOUND COMPLETE COMPARISON:  CT of the abdomen and pelvis on 07/10/2013 FINDINGS: Right Kidney: Length: 10.6 cm. The right kidney demonstrates increased cortical echogenicity with poor corticomedullary differentiation. Findings are consistent with chronic kidney disease. Very mild fullness of the renal collecting system noted without significant hydronephrosis. No shadowing calculi identified. By ultrasound, a focal complex mass is identified in the medial aspect of the lower right kidney measuring approximately 2.3 x 2.2 x 2.2 cm. This appears predominately solid and partially cystic. Left Kidney: Length: 10.4 cm. The left kidney demonstrates increased cortical echogenicity  consistent with chronic kidney disease. No hydronephrosis identified. Small simple cyst of the anterior interpolar kidney measures approximately 0.9 x 1.2 x 1.0 cm and has the appearance of a benign cyst. Bladder: Appears normal for degree of bladder distention. IMPRESSION: 1. Equal sized kidneys with increased cortical echogenicity bilaterally. Findings are consistent with known chronic kidney disease. 2. Mild fullness of the right renal collecting system without significant hydronephrosis. No evidence of  left hydronephrosis. 3. 2.3 cm mass of the inferior medial right kidney which appearance predominately solid and partially cystic. Although this could represent a complex cyst, malignancy is not excluded. In the setting of acute renal failure, the patient likely cannot receive iodinated contrast or gadolinium. Recommend imaging follow-up for the right renal lesion. Either unenhanced MRI of or MRI with and without gadolinium in the future would be the best way to evaluate the right renal mass. 4. Simple cyst of the left kidney appears benign. Electronically Signed   By: Aletta Edouard M.D.   On: 10/29/2016 11:12   Dg Chest Port 1 View  Result Date: 10/31/2016 CLINICAL DATA:  Acute respiratory failure with hypoxia. History of lung malignancy, COPD, current smoker. EXAM: PORTABLE CHEST 1 VIEW COMPARISON:  Portable chest x-ray of October 30, 2016 FINDINGS: The lungs are well-expanded. The interstitial markings remain increased diffusely. There remains partial obscuration of the left hemidiaphragm. A small left pleural effusion is suspected. The heart is top-normal in size. The pulmonary vascularity is indistinct but not clearly engorged. IMPRESSION: Persistent diffuse interstitial infiltrates likely reflecting pneumonia. Probable small left pleural effusion. No significant change since yesterday's study. Electronically Signed   By: Theon Sobotka  [place] M.D.   On: 10/31/2016 07:03   Dg Chest Port 1 View  Result Date:  10/30/2016 CLINICAL DATA:  76 y/o  F; hypoxia. EXAM: PORTABLE CHEST 1 VIEW COMPARISON:  10/28/2016 chest radiograph. FINDINGS: Diffuse increase in left lung opacification and beginning opacification of the peripheries of the right lung. Stable cardiac silhouette given projection and technique. Aortic atherosclerosis with calcification. Mild degenerative changes of the spine. IMPRESSION: Marked diffuse interval increase in left lung opacification and developing opacifications at the periphery of the right lung probably representing pneumonitis, atypical for pulmonary edema. The peripheral pattern suggests cryptogenic organizing pneumonia or eosinophilic pneumonia. Consider CT of chest for further characterization. Electronically Signed   By: Kristine Garbe M.D.   On: 10/30/2016 06:23    Kendyn Zaman, DO  [facility] Pager [phone]  If 7PM-7AM, please contact night-coverage [email or web address] Password [email or web address] 10/31/2016, 1:09 PM   LOS: 3 days

## 2016-11-01 ENCOUNTER — Encounter: Payer: PPO | Admitting: Thoracic Surgery (Cardiothoracic Vascular Surgery)

## 2016-11-01 ENCOUNTER — Encounter: Payer: Self-pay | Admitting: *Deleted

## 2016-11-01 ENCOUNTER — Ambulatory Visit: Payer: PPO | Admitting: Radiation Oncology

## 2016-11-01 ENCOUNTER — Inpatient Hospital Stay (HOSPITAL_COMMUNITY): Payer: PPO

## 2016-11-01 ENCOUNTER — Ambulatory Visit: Payer: PPO | Admitting: Interventional Cardiology

## 2016-11-01 DIAGNOSIS — C3492 Malignant neoplasm of unspecified part of left bronchus or lung: Secondary | ICD-10-CM

## 2016-11-01 LAB — CBC
HEMATOCRIT: 26.1 % — AB (ref 36.0–46.0)
Hemoglobin: 8.9 g/dL — ABNORMAL LOW (ref 12.0–15.0)
MCH: 29.1 pg (ref 26.0–34.0)
MCHC: 34.1 g/dL (ref 30.0–36.0)
MCV: 85.3 fL (ref 78.0–100.0)
Platelets: 321 10*3/uL (ref 150–400)
RBC: 3.06 MIL/uL — AB (ref 3.87–5.11)
RDW: 16.6 % — AB (ref 11.5–15.5)
WBC: 18.7 10*3/uL — AB (ref 4.0–10.5)

## 2016-11-01 LAB — COMPREHENSIVE METABOLIC PANEL
ALBUMIN: 2.4 g/dL — AB (ref 3.5–5.0)
ALT: 69 U/L — AB (ref 14–54)
AST: 97 U/L — AB (ref 15–41)
Alkaline Phosphatase: 78 U/L (ref 38–126)
Anion gap: 11 (ref 5–15)
BILIRUBIN TOTAL: 1 mg/dL (ref 0.3–1.2)
BUN: 59 mg/dL — AB (ref 6–20)
CHLORIDE: 98 mmol/L — AB (ref 101–111)
CO2: 28 mmol/L (ref 22–32)
CREATININE: 2.54 mg/dL — AB (ref 0.44–1.00)
Calcium: 9 mg/dL (ref 8.9–10.3)
GFR calc Af Amer: 20 mL/min — ABNORMAL LOW (ref >60)
GFR calc non Af Amer: 17 mL/min — ABNORMAL LOW (ref >60)
GLUCOSE: 142 mg/dL — AB (ref 65–99)
POTASSIUM: 4.2 mmol/L (ref 3.5–5.1)
Sodium: 137 mmol/L (ref 135–145)
TOTAL PROTEIN: 6.5 g/dL (ref 6.5–8.1)

## 2016-11-01 LAB — MAGNESIUM: Magnesium: 1.9 mg/dL (ref 1.7–2.4)

## 2016-11-01 LAB — PROCALCITONIN: PROCALCITONIN: 0.27 ng/mL

## 2016-11-01 LAB — PROTIME-INR
INR: 1.78
Prothrombin Time: 21 seconds — ABNORMAL HIGH (ref 11.4–15.2)

## 2016-11-01 LAB — GLUCOSE, CAPILLARY
Glucose-Capillary: 133 mg/dL — ABNORMAL HIGH (ref 65–99)
Glucose-Capillary: 169 mg/dL — ABNORMAL HIGH (ref 65–99)

## 2016-11-01 MED ORDER — DEXTROSE 5 % IV SOLN
2.0000 g | INTRAVENOUS | Status: DC
  Administered 2016-11-01 – 2016-11-03 (×3): 2 g via INTRAVENOUS
  Filled 2016-11-01 (×5): qty 2

## 2016-11-01 MED ORDER — PREDNISONE 20 MG PO TABS
40.0000 mg | ORAL_TABLET | Freq: Every day | ORAL | Status: DC
  Administered 2016-11-02 – 2016-11-04 (×3): 40 mg via ORAL
  Filled 2016-11-01 (×3): qty 2

## 2016-11-01 MED ORDER — AMLODIPINE BESYLATE 10 MG PO TABS: 10.0000 mg | ORAL_TABLET | Freq: Every day | ORAL | Status: DC

## 2016-11-01 MED ORDER — AMLODIPINE BESYLATE 5 MG PO TABS
5.0000 mg | ORAL_TABLET | Freq: Every day | ORAL | Status: DC
  Administered 2016-11-01 – 2016-11-04 (×4): 5 mg via ORAL
  Filled 2016-11-01 (×4): qty 1

## 2016-11-01 MED ORDER — FUROSEMIDE 40 MG PO TABS
40.0000 mg | ORAL_TABLET | Freq: Every day | ORAL | Status: DC
  Administered 2016-11-01 – 2016-11-04 (×4): 40 mg via ORAL
  Filled 2016-11-01 (×4): qty 1

## 2016-11-01 NOTE — Progress Notes (Signed)
Oncology Nurse Navigator Documentation  Oncology Nurse Navigator Flowsheets 11/01/2016  Navigator Location [facility]  Navigator Encounter Type Other/spoke with Renee Vance today at [facility].  She looks better and states she feels better.  She has not needed bipap since yesterday.  I was glad to see she was feeling better and encouraged her.   Patient Visit Type Inpatient  Treatment Phase Follow-up  Barriers/Navigation Needs (No Data)  Interventions Other  Acuity Level 1  Acuity Level 1 Minimal follow up required  Time Spent with Patient 15

## 2016-11-01 NOTE — Progress Notes (Signed)
DIAGNOSIS: Stage IIB (T2a, N1, M0) non-small cell lung cancer, squamous cell carcinoma presented with left hilar mass diagnosed in August 2017.  PRIOR THERAPY: Concurrent chemoradiation with weekly carboplatin for AUC of 2 and paclitaxel 45 MG/M2 started 07/05/2016, status post 5 cycles. Last cycle was given 08/01/2016.  CURRENT THERAPY: Observation.  Subjective: The patient is seen and examined today. She is a very pleasant 76 years old white female with stage IIB non-small cell lung cancer status post short course of concurrent chemoradiation with weekly carboplatin and paclitaxel for 5 weeks. Her treatment was discontinued earlier for evaluation of surgical resection. Unfortunately the patient is not a good candidate for surgical resection and she is expected to resume the course of concurrent chemoradiation for 2 more weeks. Unfortunately the patient was admitted to [facility] on 10/28/2016 with significant shortness of breath with pneumonia and COPD with subsequent ARDS. She was started on treatment with cefepime and vancomycin as well as BiPAP. She is feeling a little bit better today and she is expected to be transferred to the floor soon. She denied having any current fever or chills. She has no nausea or vomiting.  Objective: Vital signs in last 24 hours: Temp:  [97.9 F (36.6 C)-98.6 F (37 C)] 98.2 F (36.8 C) (01/23 1200) Resp:  [13-32] 27 (01/23 1000) BP: (121-202)/(45-94) 150/46 (01/23 1000) SpO2:  [80 %-100 %] 100 % (01/23 1113)  Intake/Output from previous day: 01/22 0701 - 01/23 0700 In: 950 [P.O.:900; IV Piggyback:50] Out: 2550 [Urine:2550] Intake/Output this shift: Total I/O In: -  Out: 100 [Urine:100]  General appearance: alert, cooperative, fatigued and no distress Resp: wheezes bilaterally Cardio: regular rate and rhythm, S1, S2 normal, no murmur, click, rub or gallop GI: soft, non-tender; bowel sounds normal; no masses,  no organomegaly Extremities:  extremities normal, atraumatic, no cyanosis or edema  Lab Results:   Recent Labs  10/31/16 0203 11/01/16 0338  WBC 17.7* 18.7*  HGB 8.6* 8.9*  HCT 25.5* 26.1*  PLT 293 321   BMET  Recent Labs  10/31/16 0203 11/01/16 0338  NA 137 137  K 3.5 4.2  CL 99* 98*  CO2 27 28  GLUCOSE 175* 142*  BUN 45* 59*  CREATININE 2.52* 2.54*  CALCIUM 9.0 9.0    Studies/Results: Ct Abdomen Pelvis Wo Contrast  Result Date: 10/31/2016 CLINICAL DATA:  76 year old female with a history of 2 week history of cough and short of breath and weakness. Diagnosis of flu. History of hematuria. EXAM: CT ABDOMEN AND PELVIS WITHOUT CONTRAST TECHNIQUE: Multidetector CT imaging of the abdomen and pelvis was performed following the standard protocol without IV contrast. COMPARISON:  CT 09/19/2016, PET-CT 06/17/2016, CT abdomen 07/10/2013 FINDINGS: Lower chest: Bilateral ground-glass opacity of the visualized lung bases, new from the comparison studies of 2017. Trace left-sided pleural effusion. Calcifications of native coronary vasculature. Small hiatal hernia. Hepatobiliary: Unremarkable appearance of liver parenchyma. Surgical changes of cholecystectomy. Pancreas: Unremarkable appearance of pancreas. Spleen: Unremarkable spleen Adrenals/Urinary Tract: Unremarkable appearance of bilateral adrenal glands. No evidence of nephrolithiasis. No perinephric stranding. Unremarkable course the bilateral ureters. Mild dilation of the collecting system of the right greater than left renal pelvis, relatively unchanged from the PET-CT. Urinary catheter in position. Stomach/Bowel: Enteric contrast present within the small bowel and colon, extending to the descending colon and rectum. No abnormally distended small bowel or colon. Normal appendix. Minimal diverticula. No associated inflammatory changes. Vascular/Lymphatic: Calcifications of the abdominal aorta and bilateral iliac vasculature. No aneurysm. Reproductive: Unremarkable  appearance  of uterus and the adnexa. Other: No abdominal wall hernia identified. Musculoskeletal: No displaced fracture. Multilevel degenerative changes of the thoracolumbar spine. Grade 1 anterolisthesis of L4 on L5. Vacuum disc phenomenon throughout the lower lumbar spine. Degenerative changes bilateral hips. IMPRESSION: No acute finding of the abdomen/pelvis CT. Since comparison CTs of December 2017, there is new ground-glass opacity of the bilateral lower visualized lungs, most suggestive of atypical infection given the history of a flu diagnosis. Trace left-sided pleural effusion. No evidence of nephrolithiasis or other finding of the urinary tract to explain the history of hematuria. Aortic atherosclerosis.  Associated iliac disease. Small hiatal hernia. Signed, Dulcy Fanny. Earleen Newport, DO [facility] Electronically Signed   By: Corrie Mckusick D.O.   On: 10/31/2016 18:34   Dg Chest Port 1 View  Result Date: 11/01/2016 CLINICAL DATA:  Pneumonia EXAM: PORTABLE CHEST 1 VIEW COMPARISON:  10/31/2016 FINDINGS: Diffuse pneumonia has improved. Heart remains prominent. No pneumothorax. More confluent consolidation at the left base is unchanged. IMPRESSION: Improving diffuse airspace disease. Electronically Signed   By: Marybelle Killings M.D.   On: 11/01/2016 06:55   Dg Chest Port 1 View  Result Date: 10/31/2016 CLINICAL DATA:  Acute respiratory failure with hypoxia. History of lung malignancy, COPD, current smoker. EXAM: PORTABLE CHEST 1 VIEW COMPARISON:  Portable chest x-ray of October 30, 2016 FINDINGS: The lungs are well-expanded. The interstitial markings remain increased diffusely. There remains partial obscuration of the left hemidiaphragm. A small left pleural effusion is suspected. The heart is top-normal in size. The pulmonary vascularity is indistinct but not clearly engorged. IMPRESSION: Persistent diffuse interstitial infiltrates likely reflecting  pneumonia. Probable small left pleural effusion. No significant change since yesterday's study. Electronically Signed   By: David  Martinique M.D.   On: 10/31/2016 07:03    Medications: I have reviewed the patient's current medications.  CODE STATUS: Full code  Assessment/Plan: This is a very pleasant 76 years old white female with history of stage IIB non-small cell lung cancer status post 5 weeks of concurrent chemoradiation with weekly carboplatin and paclitaxel. The patient was evaluated for surgical resection but she was not a good surgical candidate because of her poor pulmonary function. She is expected to resume her treatment with 2 more weeks of concurrent chemoradiation with carboplatin and paclitaxel. I will arrange for this treatment to start in the next 2 weeks after improvement of her condition. For the healthcare acquired pneumonia, she was started on treatment with cefepime and vancomycin and she is feeling much better. She is currently on Rocephin. For COPD exacerbation the patient will continue her current treatment with prednisone as well as nebulizer. I will arrange for the patient a follow-up appointment with me at the [place] after discharge for evaluation and recommendation regarding resuming her treatment. Thank you so much for taking good care of Mr. Pulcini, I will continue to follow up the patient with you and assist in her management an as-needed basis.   LOS: 4 days    Shauntia Levengood K. 11/01/2016

## 2016-11-01 NOTE — Progress Notes (Signed)
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 The patient went to see her primary care provider on 10/18/2016. Apparently, the patient was diagnosed with influenza and started on oseltamivir. She did not have significant improvement and continued to have shortness of breath and coughing with yellow sputum. Her shortness of breath has progressed to the point of having dyspnea with minimal exertion. As a result presented to the hospital for further evaluation. The patient called her primary care provider's office back, and was started on azithromycin on 10/26/2016. She has had low-grade fevers at home running between 99.0-100.89F. She denies any nausea, vomiting, diarrhea, abdominal pain, dysuria. There is been no hemoptysis. However, the patient has had gross hematuria for the past 2-3 days. She states that she has had intermittent hematuria since the beginning of December 2017. She denies any over-the-counter NSAIDs. Upon presentation to the emergency department, the patient was noted to have serum creatinine 2.46 and hemoglobin 8.2. INR was 4.50. Urinalysis showed gross and microscopic hematuria.Urology was consulted. In the early morning of 10/30/2016, the patient developed respiratory distress. Chest x-ray showed increase in left lung opacification as well as increased opacification in the right lung periphery. The patient was transferred to stepdown, and PCCM was consulted.  The patient was started on IV lasix with clinical improvement.  Assessment/Plan: Acute respiratory failure with hypoxia -Secondary to pneumonia and pulmonary edema--?COPD exac -early am 10/30/16--pt developed  respiratory distress--pulm edema--less likely TRALI vs ARDS  -had 3 doses lasix IV-->weaned off bipap and HFNC 10/30/16--transferred to stepdown -appreciate PCCM-->wean steroids, switch to po lasix, de-escalate abx -placed on BiPAP 1/21-->now on HFNC -Continue antibiotics -Continue bronchodilators -echo--EF 60-65%, grade 2 DD, no WMA, PASP 28  HCAP -Given her relative immunosuppressed state, will treat pt as HCAP -continue vancomycin and cefepime pending culture data -MRSA screen--neg-->d/c Vanco -viral respiratory panel--negative -10/28/2016 chest x-ray--left perihilar infiltrate; trace left effusion -10/31/16 CXR--diffuse L-lung infiltrates, R-lung peripheral opacities -check procalcitonin--0.35   Acute blood loss anemia -Hemoglobin baseline approx 10 -presenting Hgb 8.2-->7.4 -drop partly due to dilution due to IVF initially -one unit of PRBC given 10/30/16 -Hgb stable since transfusion, hematuria resolved -1/23--restart warfarin and observe  Hematuria -urology consult appreciated-->ultimately need cystoscopy out pt and CT abd -cannot give contrast due to acute on chronic renal failure -pt had microscopic hematuria dating back to [facility] on 01/20/15 -10/31/16 CT abd--no acute intraabd findings--no renal mass or nephrolithiasis  Hemoptysis -very small amount, mixed with sputum-->resolved -likely due to PNA in setting of coumadin  Acute on chronic renal failure--CKD 3 -likely hemodynamic changes -renal US--mild right collecting system fullness without hydronephrosis -may need renal consult if no improvement -check CK--114 -d/c losartan  Coagulopathy -due to coumadin--holding presently due to hematuria and ABLA -now with small amount of hemoptysis -supratherapeutic INR likely due recent use of azithromycin -one unit FFP given 10/29/16 -INR 4.50-->2.30-->1.78  Paroxysmal atrial fibrillation -restart coumadin and observe clinically -CHADS-VASc = 5 -presently in  sinus  HTN -restart amlodipine -continue metoprolol succinate  COPD -stable without wheezing -continue bronchodilators  Squamous cell carcinoma Lung--stage IIB -last chemo 08/01/16 -follows Dr. Julien Nordmann -09/19/16 CT chest--regression in size of Left infrahilar mass -wants to defer LUL lobectomy  Hyperlipidemia -continue  statin     Disposition Plan: transfer to tele Family Communication: spouse updated at bedside 1/22  Consultants: urology, PCCM  Code Status: FULL--confirmed with patient  DVT Prophylaxis: coumadin   Procedures: As Listed in Progress Note Above  Antibiotics: vanco 10/28/16>>>1/20 Cefepime 10/28/16>>>1/22 Ceftriaxone 1/23>>>   Subjective: Patient is breathing better but still has dyspnea with moderate exertion. Denies any fevers, chills, chest pain, nausea, vomiting, diarrhea, abdominal pain. No dysuria or hematuria. No rashes.  Objective: Vitals:   11/01/16 0829 11/01/16 0830 11/01/16 1000 11/01/16 1113  BP:   (!) 150/46   Pulse:      Resp:   (!) 27   Temp:      TempSrc:      SpO2: 99% 97% 99% 100%  Weight:      Height:        Intake/Output Summary (Last 24 hours) at 11/01/16 1203 Last data filed at 11/01/16 0800  Gross per 24 hour  Intake              410 ml  Output             2450 ml  Net            -2040 ml   Weight change:  Exam:   General:  Pt is alert, follows commands appropriately, not in acute distress  HEENT: No icterus, No thrush, No neck mass, [place]/AT  Cardiovascular: RRR, S1/S2, no rubs, no gallops  Respiratory: crackles  bilaterally, no wheezing, no rhonchi  Abdomen: Soft/+BS, non tender, non distended, no guarding  Extremities: No edema, No lymphangitis, No petechiae, No rashes, no synovitis   Data Reviewed: I have personally reviewed following labs and imaging studies Basic Metabolic Panel:  Recent Labs Lab 10/28/16 1602 10/29/16 0359 10/30/16 0524 10/31/16 0203 11/01/16 0338  NA  137 137 138 137 137  K 4.1 4.5 4.1 3.5 4.2  CL 101 103 103 99* 98*  CO2 '26 25 24 27 28  '$ GLUCOSE 101* 126* 163* 175* 142*  BUN 31* 34* 40* 45* 59*  CREATININE 2.46* 2.59* 2.57* 2.52* 2.54*  CALCIUM 8.7* 8.5* 8.5* 9.0 9.0  MG  --   --   --   --  1.9   Liver Function Tests:  Recent Labs Lab 10/31/16 0203 11/01/16 0338  AST 39 97*  ALT 24 69*  ALKPHOS 81 78  BILITOT 1.0 1.0  PROT 6.8 6.5  ALBUMIN 2.6* 2.4*   No results for input(s): LIPASE, AMYLASE in the last 168 hours. No results for input(s): AMMONIA in the last 168 hours. Coagulation Profile:  Recent Labs Lab 10/29/16 0702 10/30/16 0524 10/31/16 0203 11/01/16 0338  INR 4.50* 2.30 2.09 1.78   CBC:  Recent Labs Lab 10/28/16 1602 10/29/16 0359 10/30/16 0524 10/31/16 0203 11/01/16 0338  WBC 7.9 5.1 17.2* 17.7* 18.7*  NEUTROABS 6.6 4.8  --   --   --   HGB 8.2* 8.1* 7.4* 8.6* 8.9*  HCT 25.5* 24.9* 22.7* 25.5* 26.1*  MCV 87.9 89.6 90.1 86.7 85.3  PLT 285 288 311 293 321   Cardiac Enzymes:  Recent Labs Lab 10/29/16 1043 10/30/16 1944 10/31/16 0203 10/31/16 0747  CKTOTAL 114  --   --   --   TROPONINI  --  0.09* 0.07* 0.06*   BNP: Invalid input(s): POCBNP CBG:  Recent Labs Lab 11/01/16 0813 11/01/16 1135  GLUCAP 133* 169*   HbA1C:  Recent Labs  10/30/16 1204  HGBA1C 5.7*   Urine analysis:  Component Value Date/Time   COLORURINE YELLOW 10/28/2016 1649   APPEARANCEUR CLEAR 10/28/2016 1649   LABSPEC 1.004 (L) 10/28/2016 1649   PHURINE 5.0 10/28/2016 1649   GLUCOSEU NEGATIVE 10/28/2016 1649   HGBUR LARGE (A) 10/28/2016 1649   BILIRUBINUR NEGATIVE 10/28/2016 1649   KETONESUR NEGATIVE 10/28/2016 1649   PROTEINUR 30 (A) 10/28/2016 1649   NITRITE NEGATIVE 10/28/2016 1649   LEUKOCYTESUR TRACE (A) 10/28/2016 1649   Sepsis Labs: '@LABRCNTIP'$ (procalcitonin:4,lacticidven:4) ) Recent Results (from the past 240 hour(s))  Culture, Urine     Status: None   Collection Time: 10/29/16  1:32 AM   Result Value Ref Range Status   Specimen Description URINE, CLEAN CATCH  Final   Special Requests NONE  Final   Culture   Final    NO GROWTH Performed at [facility], [place] [address], [place], [place] [address]    Report Status 10/30/2016 FINAL  Final  Culture, blood (routine x 2) Call MD if unable to obtain prior to antibiotics being given     Status: None (Preliminary result)   Collection Time: 10/29/16  3:59 AM  Result Value Ref Range Status   Specimen Description BLOOD BLOOD LEFT FOREARM  Final   Special Requests BOTTLES DRAWN AEROBIC ONLY 5ML  Final   Culture   Final    NO GROWTH 2 DAYS Performed at [facility], [place] [address] [place], [place] [address]    Report Status PENDING  Incomplete  Culture, blood (routine x 2) Call MD if unable to obtain prior to antibiotics being given     Status: None (Preliminary result)   Collection Time: 10/29/16  3:59 AM  Result Value Ref Range Status   Specimen Description BLOOD LEFT ANTECUBITAL  Final   Special Requests BOTTLES DRAWN AEROBIC ONLY 5ML  Final   Culture   Final    NO GROWTH 2 DAYS Performed at [facility], [address] [place], [place] [address]    Report Status PENDING  Incomplete  Respiratory Panel by PCR     Status: None   Collection Time: 10/29/16 11:03 AM  Result Value Ref Range Status   Adenovirus NOT DETECTED NOT DETECTED Final   Coronavirus 229E NOT DETECTED NOT DETECTED Final   Coronavirus HKU1 NOT DETECTED NOT DETECTED Final   Coronavirus NL63 NOT DETECTED NOT DETECTED Final   Coronavirus OC43 NOT DETECTED NOT DETECTED Final   Metapneumovirus NOT DETECTED NOT DETECTED Final   Rhinovirus / Enterovirus NOT DETECTED NOT DETECTED Final   Influenza A NOT DETECTED NOT DETECTED Final   Influenza B NOT DETECTED NOT DETECTED Final   Parainfluenza Virus 1 NOT DETECTED NOT DETECTED Final   Parainfluenza Virus 2 NOT DETECTED NOT DETECTED Final   Parainfluenza Virus 3 NOT DETECTED NOT DETECTED Final    Parainfluenza Virus 4 NOT DETECTED NOT DETECTED Final   Respiratory Syncytial Virus NOT DETECTED NOT DETECTED Final   Bordetella pertussis NOT DETECTED NOT DETECTED Final   Chlamydophila pneumoniae NOT DETECTED NOT DETECTED Final   Mycoplasma pneumoniae NOT DETECTED NOT DETECTED Final    Comment: Performed at [facility], [place] [address], [place], [place] [address]  MRSA PCR Screening     Status: None   Collection Time: 10/29/16 11:03 AM  Result Value Ref Range Status   MRSA by PCR NEGATIVE NEGATIVE Final    Comment:        The GeneXpert MRSA Assay (FDA approved for NASAL specimens only), is one component  of a comprehensive MRSA colonization surveillance program. It is not intended to diagnose MRSA infection nor to guide or monitor treatment for MRSA infections.   Culture, sputum-assessment     Status: None   Collection Time: 10/29/16 11:30 AM  Result Value Ref Range Status   Specimen Description SPUTUM  Final   Special Requests Normal  Final   Sputum evaluation THIS SPECIMEN IS ACCEPTABLE FOR SPUTUM CULTURE  Final   Report Status 10/29/2016 FINAL  Final  Culture, respiratory (NON-Expectorated)     Status: None   Collection Time: 10/29/16 11:30 AM  Result Value Ref Range Status   Specimen Description SPUTUM  Final   Special Requests Normal Reflexed from [id number]  Final   Gram Stain   Final    FEW WBC PRESENT, PREDOMINANTLY PMN MODERATE SQUAMOUS EPITHELIAL CELLS PRESENT MODERATE GRAM POSITIVE COCCI IN PAIRS FEW GRAM VARIABLE ROD RARE GRAM NEGATIVE COCCI    Culture   Final    Consistent with normal respiratory flora. Performed at [facility], [place] [address] [place], [place] [address]    Report Status 10/31/2016 FINAL  Final     Scheduled Meds: . amLODipine  5 mg Oral Daily  . atorvastatin  80 mg Oral Daily  . budesonide (PULMICORT) nebulizer solution  0.5 mg Nebulization BID  . cefTRIAXone (ROCEPHIN)  IV  2 g Intravenous Q24H  . dextromethorphan  60 mg  Oral BID  . feeding supplement (ENSURE ENLIVE)  237 mL Oral BID BM  . furosemide  40 mg Oral Daily  . ipratropium-albuterol  3 mL Nebulization QID  . levothyroxine  125 mcg Oral QAC breakfast  . metoprolol succinate  50 mg Oral BID  . [START ON 11/02/2016] predniSONE  40 mg Oral Q breakfast   Continuous Infusions:  Procedures/Studies: Ct Abdomen Pelvis Wo Contrast  Result Date: 10/31/2016 CLINICAL DATA:  76 year old female with a history of 2 week history of cough and short of breath and weakness. Diagnosis of flu. History of hematuria. EXAM: CT ABDOMEN AND PELVIS WITHOUT CONTRAST TECHNIQUE: Multidetector CT imaging of the abdomen and pelvis was performed following the standard protocol without IV contrast. COMPARISON:  CT 09/19/2016, PET-CT 06/17/2016, CT abdomen 07/10/2013 FINDINGS: Lower chest: Bilateral ground-glass opacity of the visualized lung bases, new from the comparison studies of 2017. Trace left-sided pleural effusion. Calcifications of native coronary vasculature. Small hiatal hernia. Hepatobiliary: Unremarkable appearance of liver parenchyma. Surgical changes of cholecystectomy. Pancreas: Unremarkable appearance of pancreas. Spleen: Unremarkable spleen Adrenals/Urinary Tract: Unremarkable appearance of bilateral adrenal glands. No evidence of nephrolithiasis. No perinephric stranding. Unremarkable course the bilateral ureters. Mild dilation of the collecting system of the right greater than left renal pelvis, relatively unchanged from the PET-CT. Urinary catheter in position. Stomach/Bowel: Enteric contrast present within the small bowel and colon, extending to the descending colon and rectum. No abnormally distended small bowel or colon. Normal appendix. Minimal diverticula. No associated inflammatory changes. Vascular/Lymphatic: Calcifications of the abdominal aorta and bilateral iliac vasculature. No aneurysm. Reproductive: Unremarkable appearance of uterus and the adnexa. Other: No  abdominal wall hernia identified. Musculoskeletal: No displaced fracture. Multilevel degenerative changes of the thoracolumbar spine. Grade 1 anterolisthesis of L4 on L5. Vacuum disc phenomenon throughout the lower lumbar spine. Degenerative changes bilateral hips. IMPRESSION: No acute finding of the abdomen/pelvis CT. Since comparison CTs of December 2017, there is new ground-glass opacity of the bilateral lower visualized lungs, most suggestive of atypical infection given the history of a flu diagnosis. Trace left-sided pleural effusion. No  evidence of nephrolithiasis or other finding of the urinary tract to explain the history of hematuria. Aortic atherosclerosis.  Associated iliac disease. Small hiatal hernia. Signed, Dulcy Fanny. Earleen Newport, DO [facility] Electronically Signed   By: Corrie Mckusick D.O.   On: 10/31/2016 18:34   Dg Chest 2 View  Result Date: 10/28/2016 CLINICAL DATA:  Dyspnea, cough, history of COPD and lung cancer. Ex-smoker. EXAM: CHEST  2 VIEW COMPARISON:  CT from 09/19/2016 and CXR 05/29/2016 FINDINGS: The heart is top-normal in size. There is aortic atherosclerosis without aneurysm. Diffuse interstitial prominence is noted bilaterally left greater than right with superimposed airspace opacities about the left hilum. Findings may reflect pneumonia superimposed on interstitial lung disease, the interstitial prominence possibly related to bronchitis, post treatment change or fibrosis. Slight volume loss with tenting of left inferior pulmonary ligament. Lesser degree of interstitial markings at the right costophrenic angle and right upper lobe. There appears be a trace left effusion. No pneumothorax. No acute osseous abnormality. IMPRESSION: Findings suspicious for left-sided perihilar pneumonia superimposed on interstitial lung disease. Follow-up therefore recommended to ensure clearance. Followup PA and lateral chest X-ray is recommended  in 3-4 weeks following trial of antibiotic therapy to ensure resolution. Trace left effusion. Electronically Signed   By: Ashley Royalty M.D.   On: 10/28/2016 15:40   US Renal  Result Date: 10/29/2016 CLINICAL DATA:  Acute on chronic renal failure. History of squamous cell carcinoma of the left lung. EXAM: RENAL / URINARY TRACT ULTRASOUND COMPLETE COMPARISON:  CT of the abdomen and pelvis on 07/10/2013 FINDINGS: Right Kidney: Length: 10.6 cm. The right kidney demonstrates increased cortical echogenicity with poor corticomedullary differentiation. Findings are consistent with chronic kidney disease. Very mild fullness of the renal collecting system noted without significant hydronephrosis. No shadowing calculi identified. By ultrasound, a focal complex mass is identified in the medial aspect of the lower right kidney measuring approximately 2.3 x 2.2 x 2.2 cm. This appears predominately solid and partially cystic. Left Kidney: Length: 10.4 cm. The left kidney demonstrates increased cortical echogenicity consistent with chronic kidney disease. No hydronephrosis identified. Small simple cyst of the anterior interpolar kidney measures approximately 0.9 x 1.2 x 1.0 cm and has the appearance of a benign cyst. Bladder: Appears normal for degree of bladder distention. IMPRESSION: 1. Equal sized kidneys with increased cortical echogenicity bilaterally. Findings are consistent with known chronic kidney disease. 2. Mild fullness of the right renal collecting system without significant hydronephrosis. No evidence of left hydronephrosis. 3. 2.3 cm mass of the inferior medial right kidney which appearance predominately solid and partially cystic. Although this could represent a complex cyst, malignancy is not excluded. In the setting of acute renal failure, the patient likely cannot receive iodinated contrast or gadolinium. Recommend imaging follow-up for the right renal lesion. Either unenhanced MRI of or MRI with and without  gadolinium in the future would be the best way to evaluate the right renal mass. 4. Simple cyst of the left kidney appears benign. Electronically Signed   By: Aletta Edouard M.D.   On: 10/29/2016 11:12   Dg Chest Port 1 View  Result Date: 11/01/2016 CLINICAL DATA:  Pneumonia EXAM: PORTABLE CHEST 1 VIEW COMPARISON:  10/31/2016 FINDINGS: Diffuse pneumonia has improved. Heart remains prominent. No pneumothorax. More confluent consolidation at the left base is unchanged. IMPRESSION: Improving diffuse airspace disease. Electronically Signed   By: Marybelle Killings M.D.   On: 11/01/2016 06:55   Dg Chest [facility]  Result Date: 10/31/2016 CLINICAL DATA:  Acute respiratory failure with hypoxia. History of lung malignancy, COPD, current smoker. EXAM: PORTABLE CHEST 1 VIEW COMPARISON:  Portable chest x-ray of October 30, 2016 FINDINGS: The lungs are well-expanded. The interstitial markings remain increased diffusely. There remains partial obscuration of the left hemidiaphragm. A small left pleural effusion is suspected. The heart is top-normal in size. The pulmonary vascularity is indistinct but not clearly engorged. IMPRESSION: Persistent diffuse interstitial infiltrates likely reflecting pneumonia. Probable small left pleural effusion. No significant change since yesterday's study. Electronically Signed   By: Righteous Claiborne  [place] M.D.   On: 10/31/2016 07:03   Dg Chest Port 1 View  Result Date: 10/30/2016 CLINICAL DATA:  76 y/o  F; hypoxia. EXAM: PORTABLE CHEST 1 VIEW COMPARISON:  10/28/2016 chest radiograph. FINDINGS: Diffuse increase in left lung opacification and beginning opacification of the peripheries of the right lung. Stable cardiac silhouette given projection and technique. Aortic atherosclerosis with calcification. Mild degenerative changes of the spine. IMPRESSION: Marked diffuse interval increase in left lung opacification and developing opacifications at the periphery of the right lung probably representing  pneumonitis, atypical for pulmonary edema. The peripheral pattern suggests cryptogenic organizing pneumonia or eosinophilic pneumonia. Consider CT of chest for further characterization. Electronically Signed   By: Kristine Garbe M.D.   On: 10/30/2016 06:23    Meredyth Hornung, DO  [facility] Pager [phone]  If 7PM-7AM, please contact night-coverage [email or web address] Password [email or web address] 11/01/2016, 12:03 PM   LOS: 4 days

## 2016-11-01 NOTE — Progress Notes (Addendum)
PULMONARY / CRITICAL CARE MEDICINE   Name: ANNALENA PIATT MRN: [id number] DOB: [birth year]    ADMISSION DATE:  10/28/2016 CONSULTATION DATE:  10/30/2016  REFERRING MD:  Dr. 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  -seen by her primary care doctor. Was given azithromycin on January 17. She continued to worsen prompting ED visit.  -In ER serum creatinine 2.46 and hemoglobin 8.2. INR was 4.50. Urinalysis showed gross and microscopic hematuria.  She was given 1 u FFP. She was given IV antibiotics for possible pneumonia. She was admitted to the floors. -1/21 PCCM was consulted for further recommendations.  ABG with 7.5/33/55 on 10L HFNC   SUBJECTIVE:  Feels better   VITAL SIGNS: BP (!) 156/67 (BP Location: Left Arm)   Pulse 74   Temp 97.9 F (36.6 C) (Oral)   Resp 20   Ht '5\' 4"'$  (1.626 m)   Wt 151 lb 14.4 oz (68.9 kg)   SpO2 97%   BMI 26.07 kg/m   HEMODYNAMICS:    VENTILATOR SETTINGS:    INTAKE / OUTPUT:  Intake/Output Summary (Last 24 hours) at 11/01/16 1016 Last data filed at 11/01/16 0800  Gross per 24 hour  Intake              410 ml  Output             2450 ml  Net            -2040 ml   General appearance:  76 year old  female, well nourishedNAD, currently in acute distress,  conversant and looks better Eyes: anicteric  moist conjunctivae; PERRL, EOMI bilaterally. Mouth:  membranes and no mucosal ulcerations; normal hard and soft palate Neck: Trachea midline; neck supple, no JVD Lungs/chest: diffuse rales w/ normal respiratory effort and no intercostal retractions CV: RRR, no MRGs  Abdomen: Soft, non-tender; no masses or HSM Extremities: No peripheral edema or extremity lymphadenopathy Skin: Normal  temperature, turgor and texture; no rash, ulcers or subcutaneous nodules, ecchymotic discoloration of the right hand Psych: Appropriate affect, alert and oriented to person, place and time LABS:  BMET  Recent Labs Lab 10/30/16 0524 10/31/16 0203 11/01/16 0338  NA 138 137 137  K 4.1 3.5 4.2  CL 103 99* 98*  CO2 '24 27 28  '$ BUN 40* 45* 59*  CREATININE 2.57* 2.52* 2.54*  GLUCOSE 163* 175* 142*    Electrolytes  Recent Labs Lab 10/30/16 0524 10/31/16 0203 11/01/16 0338  CALCIUM 8.5* 9.0 9.0  MG  --   --  1.9    CBC  Recent Labs Lab 10/30/16 0524 10/31/16 0203 11/01/16 0338  WBC 17.2* 17.7* 18.7*  HGB 7.4* 8.6* 8.9*  HCT 22.7* 25.5* 26.1*  PLT 311 293 321    Coag's  Recent Labs Lab 10/30/16 0524 10/31/16 0203 11/01/16 0338  INR 2.30 2.09 1.78    Sepsis Markers  Recent Labs Lab 10/30/16 1204 10/31/16 0203 11/01/16 0338  PROCALCITON 0.36 0.35 0.27    ABG  Recent Labs Lab 10/30/16 1300  PHART 7.525*  PCO2ART 33.1  PO2ART 55.3*    Liver Enzymes  Recent Labs Lab 10/31/16 0203 11/01/16 0338  AST 39 97*  ALT 24 69*  ALKPHOS 81 78  BILITOT 1.0 1.0  ALBUMIN 2.6* 2.4*  Cardiac Enzymes  Recent Labs Lab 10/30/16 1944 10/31/16 0203 10/31/16 0747  TROPONINI 0.09* 0.07* 0.06*    Glucose  Recent Labs Lab 11/01/16 0813  GLUCAP 133*    Imaging Ct Abdomen Pelvis Wo Contrast  Result Date: 10/31/2016 CLINICAL DATA:  76 year old female with a history of 2 week history of cough and short of breath and weakness. Diagnosis of flu. History of hematuria. EXAM: CT ABDOMEN AND PELVIS WITHOUT CONTRAST TECHNIQUE: Multidetector CT imaging of the abdomen and pelvis was performed following the standard protocol without IV contrast. COMPARISON:  CT 09/19/2016, PET-CT 06/17/2016, CT abdomen 07/10/2013 FINDINGS: Lower chest: Bilateral ground-glass opacity of the visualized lung bases, new from the comparison studies of 2017. Trace left-sided pleural  effusion. Calcifications of native coronary vasculature. Small hiatal hernia. Hepatobiliary: Unremarkable appearance of liver parenchyma. Surgical changes of cholecystectomy. Pancreas: Unremarkable appearance of pancreas. Spleen: Unremarkable spleen Adrenals/Urinary Tract: Unremarkable appearance of bilateral adrenal glands. No evidence of nephrolithiasis. No perinephric stranding. Unremarkable course the bilateral ureters. Mild dilation of the collecting system of the right greater than left renal pelvis, relatively unchanged from the PET-CT. Urinary catheter in position. Stomach/Bowel: Enteric contrast present within the small bowel and colon, extending to the descending colon and rectum. No abnormally distended small bowel or colon. Normal appendix. Minimal diverticula. No associated inflammatory changes. Vascular/Lymphatic: Calcifications of the abdominal aorta and bilateral iliac vasculature. No aneurysm. Reproductive: Unremarkable appearance of uterus and the adnexa. Other: No abdominal wall hernia identified. Musculoskeletal: No displaced fracture. Multilevel degenerative changes of the thoracolumbar spine. Grade 1 anterolisthesis of L4 on L5. Vacuum disc phenomenon throughout the lower lumbar spine. Degenerative changes bilateral hips. IMPRESSION: No acute finding of the abdomen/pelvis CT. Since comparison CTs of December 2017, there is new ground-glass opacity of the bilateral lower visualized lungs, most suggestive of atypical infection given the history of a flu diagnosis. Trace left-sided pleural effusion. No evidence of nephrolithiasis or other finding of the urinary tract to explain the history of hematuria. Aortic atherosclerosis.  Associated iliac disease. Small hiatal hernia. Signed, Dulcy Fanny. Earleen Newport, DO [facility] Radiology Electronically Signed   By: Corrie Mckusick D.O.   On: 10/31/2016 18:34   Dg Chest Port 1 View  Result Date:  11/01/2016 CLINICAL DATA:  Pneumonia EXAM: PORTABLE CHEST 1 VIEW COMPARISON:  10/31/2016 FINDINGS: Diffuse pneumonia has improved. Heart remains prominent. No pneumothorax. More confluent consolidation at the left base is unchanged. IMPRESSION: Improving diffuse airspace disease. Electronically Signed   By: Marybelle Killings M.D.   On: 11/01/2016 06:55  diffuse interstitial infiltrates; mild persistent improvement in aeration  STUDIES:  Echo 8/18 N EF, diastolic dysfxn  CULTURES: Blood 1/20 > MRSA 1/20 (-) Sputum 1/20: >> normal flora  RVP 1/20 > (-) Urine 1/20 >neg  ANTIBIOTICS: Rocephin 1/20 > 1/20 Cefepime 1/21 > Azithro 1/20 > 1/20  SIGNIFICANT EVENTS: 1/20 admit for dyspnea,cough, wheezing, hematuria. Symptoms have been going on for 3 weeks. 1/21 transferred to stepdown unit. BiPAP started.  LINES/TUBES:    ASSESSMENT / PLAN:  Acute on chronic hypoxic respiratory failure in setting of diffuse pulmonary infiltrates. 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  Clinically seems to have responded to lasix > abx or other therapies which would support edema as a significant culprit.  Plan Cont daily lasix Wean steroids Dc bipap Change to rocephin given culture neg (complete 8d total) Cont BDs Mobilize  Stage IIB lung cancer  Plan F/u mohammad  H/o CAD, PAF  troponin  elevation  Decompensated diastolic HF w/ pulmonary edema HTN Plan Coumadin per primary service  Trend INR  Add back Norvasc cont toprol   Acute on chronic renal failure w/ CKD 3 Plan Cont lasix  Strict I&O Renal dose meds   Anemia  Plan Per IM service   Discussion  CAP c/b diastolic HF. Responded to diuretics. NOS on sputum so we can cut back on abx. Will add back norvasc. Cont to wean O2. PCCM will sign off.   Erick Colace ACNP-BC [place] Pager # [phone] OR # [phone] if no answer  Attending Note  11/01/2016, 10:16 AM  Attending Note  76 year old female  with stage 2B squamous cell ca who presents with hypoxemic respiratory failure requiring BiPAP.  On exam, lungs with bibasilar crackles.  Did not require BIPAP overnight.  Actually off HFNC now and improving.  I reviewed CXR myself, pulmonary edema noted.  Discussed with PCCM NP.    Acute respiratory failure:             - D/C BiPAP             - D/C HFNC  - Titrate O2 for sat of 88-92%  COPD:             - Taper steroids.             - Continue breathing treatment  HCAP:             - Cefepime for a total of 8 days             - F/U on cultures daily  Acute pulmonary edema             - Lasix 40 mg PO daily             - BMET in AM             - Replace electrolytes as indicated  Discussed with PCCM-NP, PCCM will sign off, please call back if needed.  Patient seen and examined, agree with above note.  I dictated the care and orders written for this patient under my direction.  Rush Farmer, MD [phone]

## 2016-11-01 NOTE — Progress Notes (Signed)
Pharmacy Antibiotic Note  ANITRIA ANDON is a 76 y.o. female admitted on 10/28/2016 with pneumonia.  Patient started on Cefepime and Vancomycin.  Micro results negative, narrowing to Ceftriaxone to complete a total of 8 days of antibiotics. Pharmacy has been consulted for Ceftriaxone dosing.  Plan: Ceftriaxone 2g IV q24h. Last dose 1/26.  Requires no adjustment in renal impairment. Pharmacy will sign off. Please re-consult if needed.  Height: '5\' 4"'$  (162.6 cm) Weight: 151 lb 14.4 oz (68.9 kg) IBW/kg (Calculated) : 54.7  Temp (24hrs), Avg:98.2 F (36.8 C), Min:97.9 F (36.6 C), Max:98.6 F (37 C)   Recent Labs Lab 10/28/16 1602 10/29/16 0359 10/30/16 0524 10/31/16 0203 11/01/16 0338  WBC 7.9 5.1 17.2* 17.7* 18.7*  CREATININE 2.46* 2.59* 2.57* 2.52* 2.54*    Estimated Creatinine Clearance: 17.7 mL/min (by C-G formula based on SCr of 2.54 mg/dL (H)).    Allergies  Allergen Reactions  . Ampicillin Nausea And Vomiting    Has patient had a PCN reaction causing immediate rash, facial/tongue/throat swelling, SOB or lightheadedness with hypotension: no Has patient had a PCN reaction causing severe rash involving mucus membranes or skin necrosis: no Has patient had a PCN reaction that required hospitalization: no Has patient had a PCN reaction occurring within the last 10 years: no If all of the above answers are "NO", then may proceed with Cephalosporin use  . Codeine Nausea And Vomiting    Antimicrobials this admission:  Levaquin 1/19 x 1 Vancomycin 10/29/2016 >> 1/21 Cefepime 10/29/2016 >> 1/23 Ceftriaxone 1/23 >> (1/26)  Dose adjustments this admission:   Microbiology results:  1/20 UCx: NG 1/20 BCx: ngtd 1/20 MRSA PCR: neg 1/20 resp panel: neg 1/20 Strep pneumo Ag: neg 1/20: Sputum: NF  Thank you for allowing pharmacy to be a part of this patient's care.  Hershal Coria 11/01/2016 10:43 AM

## 2016-11-02 ENCOUNTER — Encounter: Payer: Self-pay | Admitting: *Deleted

## 2016-11-02 DIAGNOSIS — Z7901 Long term (current) use of anticoagulants: Secondary | ICD-10-CM

## 2016-11-02 DIAGNOSIS — D62 Acute posthemorrhagic anemia: Secondary | ICD-10-CM

## 2016-11-02 DIAGNOSIS — D689 Coagulation defect, unspecified: Secondary | ICD-10-CM

## 2016-11-02 DIAGNOSIS — J438 Other emphysema: Secondary | ICD-10-CM

## 2016-11-02 DIAGNOSIS — I480 Paroxysmal atrial fibrillation: Secondary | ICD-10-CM

## 2016-11-02 DIAGNOSIS — R310 Gross hematuria: Secondary | ICD-10-CM

## 2016-11-02 LAB — TYPE AND SCREEN
ABO/RH(D): B NEG
ANTIBODY SCREEN: NEGATIVE
UNIT DIVISION: 0
UNIT DIVISION: 0

## 2016-11-02 LAB — BASIC METABOLIC PANEL
Anion gap: 11 (ref 5–15)
BUN: 68 mg/dL — ABNORMAL HIGH (ref 6–20)
CHLORIDE: 99 mmol/L — AB (ref 101–111)
CO2: 27 mmol/L (ref 22–32)
Calcium: 8.9 mg/dL (ref 8.9–10.3)
Creatinine, Ser: 2.42 mg/dL — ABNORMAL HIGH (ref 0.44–1.00)
GFR calc Af Amer: 21 mL/min — ABNORMAL LOW (ref >60)
GFR calc non Af Amer: 18 mL/min — ABNORMAL LOW (ref >60)
GLUCOSE: 167 mg/dL — AB (ref 65–99)
POTASSIUM: 4.5 mmol/L (ref 3.5–5.1)
Sodium: 137 mmol/L (ref 135–145)

## 2016-11-02 LAB — MAGNESIUM: Magnesium: 2 mg/dL (ref 1.7–2.4)

## 2016-11-02 LAB — CBC
HEMATOCRIT: 26.8 % — AB (ref 36.0–46.0)
HEMOGLOBIN: 8.6 g/dL — AB (ref 12.0–15.0)
MCH: 27.9 pg (ref 26.0–34.0)
MCHC: 32.1 g/dL (ref 30.0–36.0)
MCV: 87 fL (ref 78.0–100.0)
Platelets: 333 10*3/uL (ref 150–400)
RBC: 3.08 MIL/uL — ABNORMAL LOW (ref 3.87–5.11)
RDW: 16.4 % — ABNORMAL HIGH (ref 11.5–15.5)
WBC: 14.5 10*3/uL — ABNORMAL HIGH (ref 4.0–10.5)

## 2016-11-02 LAB — PROTIME-INR
INR: 1.56
PROTHROMBIN TIME: 18.8 s — AB (ref 11.4–15.2)

## 2016-11-02 NOTE — Progress Notes (Signed)
Subjective: Patient reports no urologic issues. No voiding complaints. Foley d/c'd.   Objective: Vital signs in last 24 hours: Temp:  [97.5 F (36.4 C)-99.2 F (37.3 C)] 97.5 F (36.4 C) (01/24 0315) Resp:  [16-37] 19 (01/24 0600) BP: (127-170)/(43-77) 127/43 (01/24 0600) SpO2:  [91 %-100 %] 100 % (01/24 0600)  Intake/Output from previous day: 01/23 0701 - 01/24 0700 In: -  Out: 1700 [Urine:1700] Intake/Output this shift: No intake/output data recorded.  Physical Exam:  NAD A&Ox3 in bed.  Voided in bedside commode - no issues - clear per nurse   Lab Results:  Recent Labs  10/31/16 0203 11/01/16 0338 11/02/16 0339  HGB 8.6* 8.9* 8.6*  HCT 25.5* 26.1* 26.8*   BMET  Recent Labs  11/01/16 0338 11/02/16 0339  NA 137 137  K 4.2 4.5  CL 98* 99*  CO2 28 27  GLUCOSE 142* 167*  BUN 59* 68*  CREATININE 2.54* 2.42*  CALCIUM 9.0 8.9    Recent Labs  10/31/16 0203 11/01/16 0338 11/02/16 0339  INR 2.09 1.78 1.56   No results for input(s): LABURIN in the last 72 hours. Results for orders placed or performed during the hospital encounter of 10/28/16  Culture, Urine     Status: None   Collection Time: 10/29/16  1:32 AM  Result Value Ref Range Status   Specimen Description URINE, CLEAN CATCH  Final   Special Requests NONE  Final   Culture   Final    NO GROWTH Performed at [facility], [place] [address] [place], [place] [address]    Report Status 10/30/2016 FINAL  Final  Culture, blood (routine x 2) Call MD if unable to obtain prior to antibiotics being given     Status: None (Preliminary result)   Collection Time: 10/29/16  3:59 AM  Result Value Ref Range Status   Specimen Description BLOOD BLOOD LEFT FOREARM  Final   Special Requests BOTTLES DRAWN AEROBIC ONLY 5ML  Final   Culture   Final    NO GROWTH 3 DAYS Performed at [facility], [place] [address], [place], [place] [address]    Report Status PENDING  Incomplete  Culture, blood (routine x 2)  Call MD if unable to obtain prior to antibiotics being given     Status: None (Preliminary result)   Collection Time: 10/29/16  3:59 AM  Result Value Ref Range Status   Specimen Description BLOOD LEFT ANTECUBITAL  Final   Special Requests BOTTLES DRAWN AEROBIC ONLY 5ML  Final   Culture   Final    NO GROWTH 3 DAYS Performed at [facility], [place] [address] [place], [place] [address]    Report Status PENDING  Incomplete  Respiratory Panel by PCR     Status: None   Collection Time: 10/29/16 11:03 AM  Result Value Ref Range Status   Adenovirus NOT DETECTED NOT DETECTED Final   Coronavirus 229E NOT DETECTED NOT DETECTED Final   Coronavirus HKU1 NOT DETECTED NOT DETECTED Final   Coronavirus NL63 NOT DETECTED NOT DETECTED Final   Coronavirus OC43 NOT DETECTED NOT DETECTED Final   Metapneumovirus NOT DETECTED NOT DETECTED Final   Rhinovirus / Enterovirus NOT DETECTED NOT DETECTED Final   Influenza A NOT DETECTED NOT DETECTED Final   Influenza B NOT DETECTED NOT DETECTED Final   Parainfluenza Virus 1 NOT DETECTED NOT DETECTED Final   Parainfluenza Virus 2 NOT DETECTED NOT DETECTED Final   Parainfluenza Virus 3 NOT DETECTED NOT DETECTED Final  Parainfluenza Virus 4 NOT DETECTED NOT DETECTED Final   Respiratory Syncytial Virus NOT DETECTED NOT DETECTED Final   Bordetella pertussis NOT DETECTED NOT DETECTED Final   Chlamydophila pneumoniae NOT DETECTED NOT DETECTED Final   Mycoplasma pneumoniae NOT DETECTED NOT DETECTED Final    Comment: Performed at [facility], [place] [address] [place], [place] [address]  MRSA PCR Screening     Status: None   Collection Time: 10/29/16 11:03 AM  Result Value Ref Range Status   MRSA by PCR NEGATIVE NEGATIVE Final    Comment:        The GeneXpert MRSA Assay (FDA approved for NASAL specimens only), is one component of a comprehensive MRSA colonization surveillance program. It is not intended to diagnose MRSA infection nor to guide or monitor  treatment for MRSA infections.   Culture, sputum-assessment     Status: None   Collection Time: 10/29/16 11:30 AM  Result Value Ref Range Status   Specimen Description SPUTUM  Final   Special Requests Normal  Final   Sputum evaluation THIS SPECIMEN IS ACCEPTABLE FOR SPUTUM CULTURE  Final   Report Status 10/29/2016 FINAL  Final  Culture, respiratory (NON-Expectorated)     Status: None   Collection Time: 10/29/16 11:30 AM  Result Value Ref Range Status   Specimen Description SPUTUM  Final   Special Requests Normal Reflexed from [id number]  Final   Gram Stain   Final    FEW WBC PRESENT, PREDOMINANTLY PMN MODERATE SQUAMOUS EPITHELIAL CELLS PRESENT MODERATE GRAM POSITIVE COCCI IN PAIRS FEW GRAM VARIABLE ROD RARE GRAM NEGATIVE COCCI    Culture   Final    Consistent with normal respiratory flora. Performed at [facility], [place] [address], [place], [place] [address]    Report Status 10/31/2016 FINAL  Final    Studies/Results: Ct Abdomen Pelvis Wo Contrast  Result Date: 10/31/2016 CLINICAL DATA:  76 year old female with a history of 2 week history of cough and short of breath and weakness. Diagnosis of flu. History of hematuria. EXAM: CT ABDOMEN AND PELVIS WITHOUT CONTRAST TECHNIQUE: Multidetector CT imaging of the abdomen and pelvis was performed following the standard protocol without IV contrast. COMPARISON:  CT 09/19/2016, PET-CT 06/17/2016, CT abdomen 07/10/2013 FINDINGS: Lower chest: Bilateral ground-glass opacity of the visualized lung bases, new from the comparison studies of 2017. Trace left-sided pleural effusion. Calcifications of native coronary vasculature. Small hiatal hernia. Hepatobiliary: Unremarkable appearance of liver parenchyma. Surgical changes of cholecystectomy. Pancreas: Unremarkable appearance of pancreas. Spleen: Unremarkable spleen Adrenals/Urinary Tract: Unremarkable appearance of bilateral adrenal glands. No evidence of nephrolithiasis. No perinephric stranding.  Unremarkable course the bilateral ureters. Mild dilation of the collecting system of the right greater than left renal pelvis, relatively unchanged from the PET-CT. Urinary catheter in position. Stomach/Bowel: Enteric contrast present within the small bowel and colon, extending to the descending colon and rectum. No abnormally distended small bowel or colon. Normal appendix. Minimal diverticula. No associated inflammatory changes. Vascular/Lymphatic: Calcifications of the abdominal aorta and bilateral iliac vasculature. No aneurysm. Reproductive: Unremarkable appearance of uterus and the adnexa. Other: No abdominal wall hernia identified. Musculoskeletal: No displaced fracture. Multilevel degenerative changes of the thoracolumbar spine. Grade 1 anterolisthesis of L4 on L5. Vacuum disc phenomenon throughout the lower lumbar spine. Degenerative changes bilateral hips. IMPRESSION: No acute finding of the abdomen/pelvis CT. Since comparison CTs of December 2017, there is new ground-glass opacity of the bilateral lower visualized lungs, most suggestive of atypical infection given the history of a flu  diagnosis. Trace left-sided pleural effusion. No evidence of nephrolithiasis or other finding of the urinary tract to explain the history of hematuria. Aortic atherosclerosis.  Associated iliac disease. Small hiatal hernia. Signed, Dulcy Fanny. Earleen Newport, DO [facility] Radiology Electronically Signed   By: Corrie Mckusick D.O.   On: 10/31/2016 18:34   Dg Chest Port 1 View  Result Date: 11/01/2016 CLINICAL DATA:  Pneumonia EXAM: PORTABLE CHEST 1 VIEW COMPARISON:  10/31/2016 FINDINGS: Diffuse pneumonia has improved. Heart remains prominent. No pneumothorax. More confluent consolidation at the left base is unchanged. IMPRESSION: Improving diffuse airspace disease. Electronically Signed   By: Marybelle Killings M.D.   On: 11/01/2016 06:55   I reviewed the CT images and compared to U/S  images.    Assessment/Plan:  Gross hematuria - f/u in outpt for evaluation.   AKI - no urologic issues on CT (stone, hydro, etc).   Will sign off. Call with questions.    LOS: 5 days   Renso Swett 11/02/2016, 8:13 AM

## 2016-11-02 NOTE — Progress Notes (Signed)
Oncology Nurse Navigator Documentation  Oncology Nurse Navigator Flowsheets 11/02/2016  Navigator Location [facility]  Navigator Encounter Type Other/I spoke with Dr. Julien Nordmann regarding [name] in the hospital and her appt with him tomorrow.  He asked for her to be re-scheduled in 1-2 weeks.  I cancelled appt and updated scheduling to re-schedule.   Barriers/Navigation Needs Coordination of Care  Interventions Coordination of Care  Acuity Level 1  Time Spent with Patient 15

## 2016-11-02 NOTE — Progress Notes (Addendum)
PROGRESS NOTE    Renee Vance  JKK:[id number] DOB: Apr 19, 1941 DOA: 10/28/2016 PCP: [name]  Brief Narrative: Renee Vance is a 76 y.o. 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 The patient went to see her primary care provider on 10/18/2016. Apparently, the patient was diagnosed with influenza and started on oseltamivir. She did not have significant improvement and continued to have shortness of breath and coughing with yellow sputum. Her shortness of breath has progressed to the point of having dyspnea with minimal exertion. As a result presented to the hospital for further evaluation. The patient called her primary care provider's office back, and was started on azithromycin on 10/26/2016. She has had low-grade fevers at home running between 99.0-100.46F. She denies any nausea, vomiting, diarrhea, abdominal pain, dysuria. There is been no hemoptysis. However, the patient has had gross hematuria for the past 2-3 days. She states that she has had intermittent hematuria since the beginning of December 2017. She denies any over-the-counter NSAIDs. Upon presentation to the emergency department, the patient was noted to have serum creatinine 2.46 and hemoglobin 8.2. INR was 4.50. Urinalysis showed gross and microscopic hematuria.Urology was consulted. In the early morning of 10/30/2016, the patient developed respiratory distress. Chest x-ray showed increase in left lung opacification as well as increased opacification in the right lung periphery. The patient was transferred to stepdown, and PCCM was consulted.  The patient was started on IV lasix with clinical improvement.   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 -continue antibiotics -continue lasix -wean O2 to room air  HCAP Initially on vancomycin and cefepime. Deescalated to cefepime. -continue cefepime  Acute blood loss anemia S/p 1u PRBC on 1/21. Stable. -repeat CBC in AM  Hemoptysis Streaky with sputum. Resolved.  Acute on chronic renal failure, stage 3 Renal ultrasound significant for right collecting system fullness with hydronephrosis. Creatinine stable. Patient sees nephrologist twice yearly at [facility] -continue to hold losartan in setting of acute injury  Coagulopathy Secondary to coumadin. Initially held secondary to hematuria and acute blood loss anemia. Given 1u FFP on 10/29/16. Coumadin restarted. -coumadin per pharmacy  Paroxysmal atrial fibrillation CHA2DS2-VASc Score is 5. In sinus rhythm -continue coumadin  Essential hypertension -continue amlodipine -continue metoprolol succinate  COPD Stable -continue bronchodilators  Squamous cell carcinoma of the lung, stage IIB Followed by Dr. Julien Nordmann  Hyperlipidemia -continue statin   DVT prophylaxis: Coumadin Code Status: Full Code Family Communication: None at bedside Disposition Plan: Transfer to telemetry   Consultants:   Urology  Procedures:  1u PRBC  (10/30/2016)  1u FFP (10/29/2016)  Antimicrobials:  vanco 10/28/16>>>1/20  Cefepime 10/28/16>>>1/22  Ceftriaxone 1/23>>>    Subjective: Patient reports feeling much better. No chest pain or significant dyspnea.  Objective: Vitals:   11/02/16 0315 11/02/16 0400 11/02/16 0600 11/02/16 0824  BP:  (!) 151/63 (!) 127/43   Pulse:      Resp:  16 19   Temp: 97.5 F (36.4 C)     TempSrc: Axillary     SpO2:  100% 100% 100%  Weight:      Height:        Intake/Output Summary (Last 24 hours) at 11/02/16 0832 Last data filed at 11/02/16 0400  Gross per 24 hour  Intake                0 ml  Output             1600 ml  Net            -1600 ml   Filed Weights   10/28/16 1456 10/28/16 2043  Weight: 68.9 kg (152 lb) 68.9 kg (151 lb 14.4 oz)    Examination:  General exam: Appears calm and comfortable Respiratory system: Bibasilar crackles. No wheezing. Respiratory effort normal. Cardiovascular system: S1 & S2 heard, RRR. No murmurs, rubs, gallops or clicks. Gastrointestinal system: Abdomen is nondistended, soft and nontender. No organomegaly or masses felt. Normal bowel sounds heard but hypoactive Central nervous system: Alert and oriented. No focal neurological deficits. Extremities: No edema. No calf tenderness Skin: No cyanosis. No rashes Psychiatry: Judgement and insight appear normal. Mood & affect appropriate.     Data Reviewed: I have personally reviewed following labs and imaging studies  CBC:  Recent Labs Lab 10/28/16 1602 10/29/16 0359 10/30/16 0524 10/31/16 0203 11/01/16 0338 11/02/16 0339  WBC 7.9 5.1 17.2* 17.7* 18.7* 14.5*  NEUTROABS 6.6 4.8  --   --   --   --   HGB 8.2* 8.1* 7.4* 8.6* 8.9* 8.6*  HCT 25.5* 24.9* 22.7* 25.5* 26.1* 26.8*  MCV 87.9 89.6 90.1 86.7 85.3 87.0  PLT 285 288 311 293 321 174   Basic Metabolic Panel:  Recent Labs Lab 10/29/16 0359 10/30/16 0524 10/31/16 0203 11/01/16 0338 11/02/16 0339  NA 137 138 137 137 137  K 4.5 4.1  3.5 4.2 4.5  CL 103 103 99* 98* 99*  CO2 '25 24 27 28 27  '$ GLUCOSE 126* 163* 175* 142* 167*  BUN 34* 40* 45* 59* 68*  CREATININE 2.59* 2.57* 2.52* 2.54* 2.42*  CALCIUM 8.5* 8.5* 9.0 9.0 8.9  MG  --   --   --  1.9 2.0   GFR: Estimated Creatinine Clearance: 18.6 mL/min (by C-G formula based on SCr of 2.42 mg/dL (H)). Liver Function Tests:  Recent Labs Lab 10/31/16 0203 11/01/16 0338  AST 39 97*  ALT 24 69*  ALKPHOS 81 78  BILITOT 1.0 1.0  PROT 6.8 6.5  ALBUMIN 2.6* 2.4*   No results for input(s): LIPASE, AMYLASE in the last 168 hours. No results for input(s): AMMONIA in the last 168 hours. Coagulation Profile:  Recent Labs Lab 10/29/16 0702 10/30/16 0524 10/31/16 0203 11/01/16 0338 11/02/16 0339  INR 4.50* 2.30 2.09 1.78 1.56   Cardiac Enzymes:  Recent Labs Lab 10/29/16 1043 10/30/16 1944 10/31/16 0203 10/31/16 0747  CKTOTAL 114  --   --   --   TROPONINI  --  0.09* 0.07* 0.06*   BNP (last 3 results) No results for input(s): PROBNP in the last 8760 hours. HbA1C:  Recent Labs  10/30/16 1204  HGBA1C 5.7*   CBG:  Recent Labs Lab 11/01/16 0813 11/01/16 1135  GLUCAP 133* 169*   Lipid Profile: No results for input(s): CHOL, HDL, LDLCALC, TRIG, CHOLHDL, LDLDIRECT in the last 72 hours. Thyroid Function Tests:  Recent Labs  10/30/16 1204  FREET4 1.56*   Anemia Panel: No results for input(s): VITAMINB12, FOLATE, FERRITIN, TIBC, IRON, RETICCTPCT in the last 72 hours. Sepsis Labs:  Recent Labs Lab 10/30/16 1204 10/31/16 0203 11/01/16 0338  PROCALCITON 0.36 0.35 0.27    Recent Results (from the past 240 hour(s))  Culture, Urine     Status: None   Collection Time: 10/29/16  1:32 AM  Result Value Ref Range Status   Specimen Description URINE, CLEAN CATCH  Final   Special Requests NONE  Final   Culture   Final    NO GROWTH Performed at [facility], [place] [address], [place], [place] [address]    Report Status 10/30/2016 FINAL  Final   Culture, blood (routine x 2) Call MD if unable to obtain prior to antibiotics being given     Status: None (Preliminary result)   Collection Time: 10/29/16  3:59 AM  Result Value Ref Range Status   Specimen Description BLOOD BLOOD LEFT FOREARM  Final   Special Requests BOTTLES DRAWN AEROBIC ONLY 5ML  Final   Culture   Final    NO GROWTH 3 DAYS Performed at [facility], [place] [address], [place], [place] [address]    Report Status PENDING  Incomplete  Culture, blood (routine x 2) Call MD if unable to obtain prior to antibiotics being given     Status: None (Preliminary result)   Collection Time: 10/29/16  3:59 AM  Result Value Ref Range Status   Specimen Description BLOOD LEFT ANTECUBITAL  Final   Special Requests BOTTLES DRAWN AEROBIC ONLY 5ML  Final   Culture   Final    NO GROWTH 3 DAYS Performed at [facility], [place] [address] [place], [place] [address]    Report Status PENDING  Incomplete  Respiratory Panel by PCR     Status: None   Collection Time: 10/29/16 11:03 AM  Result Value Ref Range Status   Adenovirus NOT DETECTED NOT DETECTED Final   Coronavirus 229E NOT DETECTED NOT DETECTED Final   Coronavirus HKU1 NOT DETECTED NOT DETECTED Final   Coronavirus NL63 NOT DETECTED NOT DETECTED Final   Coronavirus OC43 NOT DETECTED NOT DETECTED Final   Metapneumovirus NOT DETECTED NOT DETECTED Final   Rhinovirus / Enterovirus NOT DETECTED NOT DETECTED Final   Influenza A NOT DETECTED NOT DETECTED Final   Influenza B NOT DETECTED NOT DETECTED Final   Parainfluenza Virus 1 NOT DETECTED NOT DETECTED Final   Parainfluenza Virus 2 NOT DETECTED NOT DETECTED Final   Parainfluenza Virus 3 NOT DETECTED NOT DETECTED Final   Parainfluenza Virus 4 NOT DETECTED NOT DETECTED Final   Respiratory Syncytial Virus NOT DETECTED NOT DETECTED Final   Bordetella pertussis NOT DETECTED NOT DETECTED Final   Chlamydophila pneumoniae NOT DETECTED NOT DETECTED Final   Mycoplasma pneumoniae NOT  DETECTED NOT DETECTED Final    Comment: Performed at [facility], [place] [address] [place], [place] [address]  MRSA PCR Screening     Status: None   Collection Time: 10/29/16 11:03 AM  Result Value Ref Range Status   MRSA by PCR NEGATIVE NEGATIVE Final    Comment:        The GeneXpert  MRSA Assay (FDA approved for NASAL specimens only), is one component of a comprehensive MRSA colonization surveillance program. It is not intended to diagnose MRSA infection nor to guide or monitor treatment for MRSA infections.   Culture, sputum-assessment     Status: None   Collection Time: 10/29/16 11:30 AM  Result Value Ref Range Status   Specimen Description SPUTUM  Final   Special Requests Normal  Final   Sputum evaluation THIS SPECIMEN IS ACCEPTABLE FOR SPUTUM CULTURE  Final   Report Status 10/29/2016 FINAL  Final  Culture, respiratory (NON-Expectorated)     Status: None   Collection Time: 10/29/16 11:30 AM  Result Value Ref Range Status   Specimen Description SPUTUM  Final   Special Requests Normal Reflexed from [id number]  Final   Gram Stain   Final    FEW WBC PRESENT, PREDOMINANTLY PMN MODERATE SQUAMOUS EPITHELIAL CELLS PRESENT MODERATE GRAM POSITIVE COCCI IN PAIRS FEW GRAM VARIABLE ROD RARE GRAM NEGATIVE COCCI    Culture   Final    Consistent with normal respiratory flora. Performed at [facility], [place] [address] [place], [place] [address]    Report Status 10/31/2016 FINAL  Final         Radiology Studies: Ct Abdomen Pelvis Wo Contrast  Result Date: 10/31/2016 CLINICAL DATA:  76 year old female with a history of 2 week history of cough and short of breath and weakness. Diagnosis of flu. History of hematuria. EXAM: CT ABDOMEN AND PELVIS WITHOUT CONTRAST TECHNIQUE: Multidetector CT imaging of the abdomen and pelvis was performed following the standard protocol without IV contrast. COMPARISON:  CT 09/19/2016, PET-CT 06/17/2016, CT abdomen 07/10/2013 FINDINGS: Lower  chest: Bilateral ground-glass opacity of the visualized lung bases, new from the comparison studies of 2017. Trace left-sided pleural effusion. Calcifications of native coronary vasculature. Small hiatal hernia. Hepatobiliary: Unremarkable appearance of liver parenchyma. Surgical changes of cholecystectomy. Pancreas: Unremarkable appearance of pancreas. Spleen: Unremarkable spleen Adrenals/Urinary Tract: Unremarkable appearance of bilateral adrenal glands. No evidence of nephrolithiasis. No perinephric stranding. Unremarkable course the bilateral ureters. Mild dilation of the collecting system of the right greater than left renal pelvis, relatively unchanged from the PET-CT. Urinary catheter in position. Stomach/Bowel: Enteric contrast present within the small bowel and colon, extending to the descending colon and rectum. No abnormally distended small bowel or colon. Normal appendix. Minimal diverticula. No associated inflammatory changes. Vascular/Lymphatic: Calcifications of the abdominal aorta and bilateral iliac vasculature. No aneurysm. Reproductive: Unremarkable appearance of uterus and the adnexa. Other: No abdominal wall hernia identified. Musculoskeletal: No displaced fracture. Multilevel degenerative changes of the thoracolumbar spine. Grade 1 anterolisthesis of L4 on L5. Vacuum disc phenomenon throughout the lower lumbar spine. Degenerative changes bilateral hips. IMPRESSION: No acute finding of the abdomen/pelvis CT. Since comparison CTs of December 2017, there is new ground-glass opacity of the bilateral lower visualized lungs, most suggestive of atypical infection given the history of a flu diagnosis. Trace left-sided pleural effusion. No evidence of nephrolithiasis or other finding of the urinary tract to explain the history of hematuria. Aortic atherosclerosis.  Associated iliac disease. Small hiatal hernia. Signed, Dulcy Fanny. Earleen Newport, DO [facility]  Radiology Electronically Signed   By: Corrie Mckusick D.O.   On: 10/31/2016 18:34   Dg Chest Port 1 View  Result Date: 11/01/2016 CLINICAL DATA:  Pneumonia EXAM: PORTABLE CHEST 1 VIEW COMPARISON:  10/31/2016 FINDINGS: Diffuse pneumonia has improved. Heart remains prominent. No pneumothorax. More confluent consolidation at the left base is unchanged. IMPRESSION:  Improving diffuse airspace disease. Electronically Signed   By: Marybelle Killings M.D.   On: 11/01/2016 06:55        Scheduled Meds: . amLODipine  5 mg Oral Daily  . atorvastatin  80 mg Oral Daily  . budesonide (PULMICORT) nebulizer solution  0.5 mg Nebulization BID  . cefTRIAXone (ROCEPHIN)  IV  2 g Intravenous Q24H  . dextromethorphan  60 mg Oral BID  . feeding supplement (ENSURE ENLIVE)  237 mL Oral BID BM  . furosemide  40 mg Oral Daily  . ipratropium-albuterol  3 mL Nebulization QID  . levothyroxine  125 mcg Oral QAC breakfast  . metoprolol succinate  50 mg Oral BID  . predniSONE  40 mg Oral Q breakfast   Continuous Infusions:   LOS: 5 days     Cordelia Poche [facility] 11/02/2016, 8:32 AM Pager: [phone]  If 7PM-7AM, please contact night-coverage [email or web address] Password [facility] 11/02/2016, 8:32 AM

## 2016-11-03 ENCOUNTER — Other Ambulatory Visit: Payer: PPO

## 2016-11-03 ENCOUNTER — Encounter (HOSPITAL_COMMUNITY): Payer: PPO

## 2016-11-03 ENCOUNTER — Ambulatory Visit: Payer: PPO | Admitting: Internal Medicine

## 2016-11-03 ENCOUNTER — Encounter: Payer: Self-pay | Admitting: *Deleted

## 2016-11-03 DIAGNOSIS — J181 Lobar pneumonia, unspecified organism: Secondary | ICD-10-CM

## 2016-11-03 DIAGNOSIS — T451X5A Adverse effect of antineoplastic and immunosuppressive drugs, initial encounter: Secondary | ICD-10-CM

## 2016-11-03 DIAGNOSIS — D6481 Anemia due to antineoplastic chemotherapy: Secondary | ICD-10-CM

## 2016-11-03 LAB — BASIC METABOLIC PANEL
Anion gap: 8 (ref 5–15)
BUN: 71 mg/dL — AB (ref 6–20)
CO2: 29 mmol/L (ref 22–32)
CREATININE: 2.2 mg/dL — AB (ref 0.44–1.00)
Calcium: 8.6 mg/dL — ABNORMAL LOW (ref 8.9–10.3)
Chloride: 99 mmol/L — ABNORMAL LOW (ref 101–111)
GFR calc non Af Amer: 20 mL/min — ABNORMAL LOW (ref >60)
GFR, EST AFRICAN AMERICAN: 24 mL/min — AB (ref >60)
Glucose, Bld: 105 mg/dL — ABNORMAL HIGH (ref 65–99)
POTASSIUM: 4.8 mmol/L (ref 3.5–5.1)
Sodium: 136 mmol/L (ref 135–145)

## 2016-11-03 LAB — CULTURE, BLOOD (ROUTINE X 2)
CULTURE: NO GROWTH
CULTURE: NO GROWTH

## 2016-11-03 LAB — PROTIME-INR
INR: 1.29
Prothrombin Time: 16.2 seconds — ABNORMAL HIGH (ref 11.4–15.2)

## 2016-11-03 MED ORDER — WARFARIN - PHARMACIST DOSING INPATIENT: Freq: Every day | Status: DC

## 2016-11-03 MED ORDER — WARFARIN SODIUM 5 MG PO TABS
5.0000 mg | ORAL_TABLET | Freq: Once | ORAL | Status: AC
  Administered 2016-11-03: 5 mg via ORAL
  Filled 2016-11-03: qty 1

## 2016-11-03 NOTE — Progress Notes (Signed)
PROGRESS NOTE    Renee Vance  MWN:[id number] DOB: January 19, 1941 DOA: 10/28/2016 PCP: [name]  Brief Narrative: Renee Vance is a 76 y.o. 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 The patient went to see her primary care provider on 10/18/2016. Apparently, the patient was diagnosed with influenza and started on oseltamivir. She did not have significant improvement and continued to have shortness of breath and coughing with yellow sputum. Her shortness of breath has progressed to the point of having dyspnea with minimal exertion. As a result presented to the hospital for further evaluation. The patient called her primary care provider's office back, and was started on azithromycin on 10/26/2016. She has had low-grade fevers at home running between 99.0-100.28F. She denies any nausea, vomiting, diarrhea, abdominal pain, dysuria. There is been no hemoptysis. However, the patient has had gross hematuria for the past 2-3 days. She states that she has had intermittent hematuria since the beginning of December 2017. She denies any over-the-counter NSAIDs. Upon presentation to the emergency department, the patient was noted to have serum creatinine 2.46 and hemoglobin 8.2. INR was 4.50. Urinalysis showed gross and microscopic hematuria.Urology was consulted. In the early morning of 10/30/2016, the patient developed respiratory distress. Chest x-ray showed increase in left lung opacification as well as increased opacification in the right lung periphery. The patient was transferred to stepdown, and PCCM was consulted.  The patient was started on IV lasix with clinical improvement.   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 -continue antibiotics -continue lasix -wean O2 to room air as tolerated  HCAP Initially on vancomycin and cefepime. Deescalated to cefepime. -continue cefepime  Acute blood loss anemia S/p 1u PRBC on 1/21. Stable.  Hemoptysis Streaky with sputum. Resolved.  Acute on chronic renal failure, stage 3 Renal ultrasound significant for right collecting system fullness without hydronephrosis. Creatinine stable. Patient sees nephrologist twice yearly at [facility] -continue to hold losartan in setting of acute injury  Coagulopathy Secondary to coumadin. Initially held secondary to hematuria and acute blood loss anemia. Given 1u FFP on 10/29/16. Coumadin restarted. -coumadin per pharmacy  Paroxysmal atrial fibrillation CHA2DS2-VASc Score is 5. In sinus rhythm -continue coumadin  Essential hypertension -continue amlodipine -continue metoprolol succinate  COPD Stable -continue bronchodilators  Squamous cell carcinoma of the lung, stage IIB Followed by Dr. Julien Nordmann  Hyperlipidemia -continue statin  Right kidney lesion Seen on ultrasound. Complex cyst vs ?malignancy. Recommend follow-up MRI w/wo contrast. Patient can follow-up as an outpatient when kidney function has improved.   DVT  prophylaxis: Coumadin Code Status: Full Code Family Communication: None  at bedside Disposition Plan: Transfer to telemetry   Consultants:   Urology  Procedures:  1u PRBC (10/30/2016)  1u FFP (10/29/2016)  Antimicrobials:  vanco 10/28/16>>>1/20  Cefepime 10/28/16>>>1/22  Ceftriaxone 1/23>>>    Subjective: Dyspnea improved. No chest pain. Still requiring oxygen.  Objective: Vitals:   11/03/16 0755 11/03/16 0939 11/03/16 1325 11/03/16 1523  BP:  (!) 149/65 134/63   Pulse:  76 76 72  Resp:  '18 18 18  '$ Temp:   98 F (36.7 C)   TempSrc:   Oral   SpO2: 93% 94% 93% 94%  Weight:      Height:        Intake/Output Summary (Last 24 hours) at 11/03/16 1538 Last data filed at 11/03/16 1130  Gross per 24 hour  Intake              650 ml  Output              300 ml  Net              350 ml   Filed Weights   10/28/16 1456 10/28/16 2043  Weight: 68.9 kg (152 lb) 68.9 kg (151 lb 14.4 oz)    Examination:  General exam: Appears calm and comfortable Respiratory system: Mild bibasilar crackles with crackles at mid-left lung. No wheezing. Respiratory effort normal. Cardiovascular system: S1 & S2 heard, RRR. No murmurs, rubs, gallops or clicks. Gastrointestinal system: Abdomen is nondistended, soft and nontender. Normal bowel sounds heard but hypoactive Central nervous system: Alert and oriented. No focal neurological deficits. Extremities: No edema. No calf tenderness Skin: No cyanosis. No rashes Psychiatry: Judgement and insight appear normal. Mood & affect appropriate.     Data Reviewed: I have personally reviewed following labs and imaging studies  CBC:  Recent Labs Lab 10/28/16 1602 10/29/16 0359 10/30/16 0524 10/31/16 0203 11/01/16 0338 11/02/16 0339  WBC 7.9 5.1 17.2* 17.7* 18.7* 14.5*  NEUTROABS 6.6 4.8  --   --   --   --   HGB 8.2* 8.1* 7.4* 8.6* 8.9* 8.6*  HCT 25.5* 24.9* 22.7* 25.5* 26.1* 26.8*  MCV 87.9 89.6 90.1 86.7 85.3 87.0  PLT 285 288 311 293  321 355   Basic Metabolic Panel:  Recent Labs Lab 10/30/16 0524 10/31/16 0203 11/01/16 0338 11/02/16 0339 11/03/16 0516  NA 138 137 137 137 136  K 4.1 3.5 4.2 4.5 4.8  CL 103 99* 98* 99* 99*  CO2 '24 27 28 27 29  '$ GLUCOSE 163* 175* 142* 167* 105*  BUN 40* 45* 59* 68* 71*  CREATININE 2.57* 2.52* 2.54* 2.42* 2.20*  CALCIUM 8.5* 9.0 9.0 8.9 8.6*  MG  --   --  1.9 2.0  --    GFR: Estimated Creatinine Clearance: 20.4 mL/min (by C-G formula based on SCr of 2.2 mg/dL (H)). Liver Function Tests:  Recent Labs Lab 10/31/16 0203 11/01/16 0338  AST 39 97*  ALT 24 69*  ALKPHOS 81 78  BILITOT 1.0 1.0  PROT 6.8 6.5  ALBUMIN 2.6* 2.4*   No results for input(s): LIPASE, AMYLASE in the last 168 hours. No results for input(s): AMMONIA in the last 168 hours. Coagulation Profile:  Recent Labs Lab 10/30/16 0524 10/31/16 0203 11/01/16 0338 11/02/16 0339 11/03/16 0516  INR 2.30 2.09 1.78 1.56 1.29   Cardiac Enzymes:  Recent Labs Lab 10/29/16 1043 10/30/16 1944 10/31/16 0203 10/31/16 0747  CKTOTAL 114  --   --   --   TROPONINI  --  0.09* 0.07* 0.06*   BNP (last 3 results) No results for input(s): PROBNP in the last 8760 hours. HbA1C: No results for input(s): HGBA1C in the last 72 hours. CBG:  Recent Labs Lab 11/01/16 0813 11/01/16 1135  GLUCAP 133* 169*   Lipid Profile: No results for input(s): CHOL, HDL, LDLCALC, TRIG, CHOLHDL, LDLDIRECT in the last 72 hours. Thyroid Function Tests: No results for input(s): TSH, T4TOTAL, FREET4, T3FREE, THYROIDAB in the last 72 hours. Anemia Panel: No results for input(s): VITAMINB12, FOLATE, FERRITIN, TIBC, IRON, RETICCTPCT in the last 72 hours. Sepsis Labs:  Recent Labs Lab 10/30/16 1204 10/31/16 0203 11/01/16 0338  PROCALCITON 0.36 0.35 0.27    Recent Results (from the past 240 hour(s))  Culture, Urine     Status: None   Collection Time: 10/29/16  1:32 AM  Result Value Ref Range Status   Specimen Description URINE,  CLEAN CATCH  Final   Special Requests NONE  Final   Culture   Final    NO GROWTH Performed at [facility], [place] [address] [place], [place] [address]    Report Status 10/30/2016 FINAL  Final  Culture, blood (routine x 2) Call MD if unable to obtain prior to antibiotics being given     Status: None   Collection Time: 10/29/16  3:59 AM  Result Value Ref Range Status   Specimen Description BLOOD BLOOD LEFT FOREARM  Final   Special Requests BOTTLES DRAWN AEROBIC ONLY 5ML  Final   Culture   Final    NO GROWTH 5 DAYS Performed at [facility], [place] [address], [place], [place] [address]    Report Status 11/03/2016 FINAL  Final  Culture, blood (routine x 2) Call MD if unable to obtain prior to antibiotics being given     Status: None   Collection Time: 10/29/16  3:59 AM  Result Value Ref Range Status   Specimen Description BLOOD LEFT ANTECUBITAL  Final   Special Requests BOTTLES DRAWN AEROBIC ONLY 5ML  Final   Culture   Final    NO GROWTH 5 DAYS Performed at [facility], [place] [address] [place], [place] [address]    Report Status 11/03/2016 FINAL  Final  Respiratory Panel by PCR     Status: None   Collection Time: 10/29/16 11:03 AM  Result Value Ref Range Status   Adenovirus NOT DETECTED NOT DETECTED Final   Coronavirus 229E NOT DETECTED NOT DETECTED Final   Coronavirus HKU1 NOT DETECTED NOT DETECTED Final   Coronavirus NL63 NOT DETECTED NOT DETECTED Final   Coronavirus OC43 NOT DETECTED NOT DETECTED Final   Metapneumovirus NOT DETECTED NOT DETECTED Final   Rhinovirus / Enterovirus NOT DETECTED NOT DETECTED Final   Influenza A NOT DETECTED NOT DETECTED Final   Influenza B NOT DETECTED NOT DETECTED Final   Parainfluenza Virus 1 NOT DETECTED NOT DETECTED Final   Parainfluenza Virus 2 NOT DETECTED NOT DETECTED Final   Parainfluenza Virus 3 NOT DETECTED NOT DETECTED Final   Parainfluenza Virus 4 NOT DETECTED NOT DETECTED Final   Respiratory Syncytial Virus NOT  DETECTED NOT DETECTED Final   Bordetella pertussis NOT DETECTED NOT DETECTED Final   Chlamydophila pneumoniae NOT DETECTED NOT DETECTED Final   Mycoplasma pneumoniae NOT DETECTED NOT DETECTED Final    Comment: Performed at [facility], [place] [address] [place], [place] [address]  MRSA PCR Screening     Status: None   Collection Time: 10/29/16 11:03 AM  Result Value  Ref Range Status   MRSA by PCR NEGATIVE NEGATIVE Final    Comment:        The GeneXpert MRSA Assay (FDA approved for NASAL specimens only), is one component of a comprehensive MRSA colonization surveillance program. It is not intended to diagnose MRSA infection nor to guide or monitor treatment for MRSA infections.   Culture, sputum-assessment     Status: None   Collection Time: 10/29/16 11:30 AM  Result Value Ref Range Status   Specimen Description SPUTUM  Final   Special Requests Normal  Final   Sputum evaluation THIS SPECIMEN IS ACCEPTABLE FOR SPUTUM CULTURE  Final   Report Status 10/29/2016 FINAL  Final  Culture, respiratory (NON-Expectorated)     Status: None   Collection Time: 10/29/16 11:30 AM  Result Value Ref Range Status   Specimen Description SPUTUM  Final   Special Requests Normal Reflexed from [id number]  Final   Gram Stain   Final    FEW WBC PRESENT, PREDOMINANTLY PMN MODERATE SQUAMOUS EPITHELIAL CELLS PRESENT MODERATE GRAM POSITIVE COCCI IN PAIRS FEW GRAM VARIABLE ROD RARE GRAM NEGATIVE COCCI    Culture   Final    Consistent with normal respiratory flora. Performed at [facility], [place] [address] [place], [place] [address]    Report Status 10/31/2016 FINAL  Final         Radiology Studies: No results found.      Scheduled Meds: . amLODipine  5 mg Oral Daily  . atorvastatin  80 mg Oral Daily  . budesonide (PULMICORT) nebulizer solution  0.5 mg Nebulization BID  . cefTRIAXone (ROCEPHIN)  IV  2 g Intravenous Q24H  . dextromethorphan  60 mg Oral BID  . feeding supplement  (ENSURE ENLIVE)  237 mL Oral BID BM  . furosemide  40 mg Oral Daily  . ipratropium-albuterol  3 mL Nebulization QID  . levothyroxine  125 mcg Oral QAC breakfast  . metoprolol succinate  50 mg Oral BID  . predniSONE  40 mg Oral Q breakfast  . warfarin  5 mg Oral ONCE-1800  . Warfarin - Pharmacist Dosing Inpatient   Does not apply q1800   Continuous Infusions:   LOS: 6 days     Cordelia Poche [facility] 11/03/2016, 3:38 PM Pager: [phone]  If 7PM-7AM, please contact night-coverage [email or web address] Password [email or web address] 11/03/2016, 3:38 PM

## 2016-11-03 NOTE — Progress Notes (Signed)
ANTICOAGULATION CONSULT NOTE - Initial Consult  Pharmacy Consult for warfarin Indication: atrial fibrillation  Allergies  Allergen Reactions  . Ampicillin Nausea And Vomiting    Has patient had a PCN reaction causing immediate rash, facial/tongue/throat swelling, SOB or lightheadedness with hypotension: no Has patient had a PCN reaction causing severe rash involving mucus membranes or skin necrosis: no Has patient had a PCN reaction that required hospitalization: no Has patient had a PCN reaction occurring within the last 10 years: no If all of the above answers are "NO", then may proceed with Cephalosporin use  . Codeine Nausea And Vomiting    Patient Measurements: Height: '5\' 4"'$  (162.6 cm) Weight: 151 lb 14.4 oz (68.9 kg) IBW/kg (Calculated) : 54.7   Vital Signs: Temp: 98.6 F (37 C) (01/25 0709) Temp Source: Oral (01/25 0709) BP: 149/65 (01/25 0939) Pulse Rate: 76 (01/25 0939)  Labs:  Recent Labs  11/01/16 0338 11/02/16 0339 11/03/16 0516  HGB 8.9* 8.6*  --   HCT 26.1* 26.8*  --   PLT 321 333  --   LABPROT 21.0* 18.8* 16.2*  INR 1.78 1.56 1.29  CREATININE 2.54* 2.42* 2.20*    Estimated Creatinine Clearance: 20.4 mL/min (by C-G formula based on SCr of 2.2 mg/dL (H)).   Medical History: Past Medical History:  Diagnosis Date  . 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 Bradycardia   . CAD (coronary artery disease)   . COPD (chronic obstructive pulmonary disease) ([place])   . Coronary atherosclerosis of native coronary artery 2011  . Encounter for antineoplastic chemotherapy 06/23/2016  . Fibrocystic breast   . History of radiation therapy 07/05/16 - 08/09/16    Left Lung: 45 Gy in 25 fractions  . HTN (hypertension)   . Hyperlipidemia   . Hypothyroidism   . Obesity   . Pain in joint   . 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   PTA coumadin initially held secondary to hematuria and acute blood loss anemia. Given 1u FFP on 10/29/16.  -coumadin per pharmacy  Home dose 2.'5mg'$  on Sun, Tues, Thurs and '5mg'$  all other days.  Of note did decrease dose to 2.'5mg'$  daily while on azithromycin, finished on 1/20.  11/03/2016  INR 1.29  Scr 2.2, CrCl~ 9ms/min  Consuming 90% of regular diet  DI: none noted  Goal of Therapy:  INR 2-3 Monitor platelets by anticoagulation protocol   Plan:  Warfarin '5mg'$  po x 1 tonight Daily INR  EDolly RiasRPh 11/03/2016, 11:55 AM Pager [phone]

## 2016-11-03 NOTE — Progress Notes (Signed)
Oncology Nurse Navigator Documentation  Oncology Nurse Navigator Flowsheets 11/03/2016  Navigator Location [facility]  Navigator Encounter Type Other/went to see Renee Vance today. She is still an inpatient at [facility].  I gave her a follow up appt with Dr. Julien Nordmann. She verbalized understanding of appt.   Patient Visit Type Inpatient  Barriers/Navigation Needs Coordination of Care  Interventions Coordination of Care  Acuity Level 2  Acuity Level 2 Assistance expediting appointments  Time Spent with Patient 30

## 2016-11-03 NOTE — Care Management Important Message (Signed)
Important Message  Patient Details  Name: Renee Vance MRN: [id number] Date of Birth: 11/21/1940   Medicare Important Message Given:  Yes    Kerin Salen 11/03/2016, 12:02 [place] Message  Patient Details  Name: Renee Vance MRN: [id number] Date of Birth: 09/12/1941   Medicare Important Message Given:  Yes    Kerin Salen 11/03/2016, 12:01 PM

## 2016-11-03 NOTE — Progress Notes (Signed)
SATURATION QUALIFICATIONS: (This note is used to comply with regulatory documentation for home oxygen)  Patient Saturations on Room Air at Rest = 87%  Patient Saturations on Room Air while Ambulating = 82%  Patient Saturations on 4 Liters of oxygen while Ambulating = 92%  Please briefly explain why patient needs home oxygen:

## 2016-11-04 DIAGNOSIS — I5033 Acute on chronic diastolic (congestive) heart failure: Secondary | ICD-10-CM

## 2016-11-04 LAB — PROTIME-INR
INR: 1.23
Prothrombin Time: 15.6 seconds — ABNORMAL HIGH (ref 11.4–15.2)

## 2016-11-04 MED ORDER — FUROSEMIDE 40 MG PO TABS: 40.0000 mg | ORAL_TABLET | Freq: Every day | ORAL | 0 refills | Status: DC

## 2016-11-04 MED ORDER — ENSURE ENLIVE PO LIQD: 237.0000 mL | Freq: Two times a day (BID) | ORAL | 60 refills | Status: DC

## 2016-11-04 MED ORDER — IPRATROPIUM-ALBUTEROL 0.5-2.5 (3) MG/3ML IN SOLN: 3.0000 mL | Freq: Four times a day (QID) | RESPIRATORY_TRACT | 0 refills | Status: DC | PRN

## 2016-11-04 MED ORDER — BUDESONIDE 0.5 MG/2ML IN SUSP: 0.5000 mg | Freq: Two times a day (BID) | RESPIRATORY_TRACT | 60 refills | Status: DC

## 2016-11-04 MED ORDER — DOXYCYCLINE HYCLATE 100 MG PO TBEC: 100.0000 mg | DELAYED_RELEASE_TABLET | Freq: Two times a day (BID) | ORAL | 0 refills | Status: AC

## 2016-11-04 MED ORDER — WARFARIN SODIUM 5 MG PO TABS: 5.0000 mg | ORAL_TABLET | Freq: Once | ORAL | Status: DC

## 2016-11-04 NOTE — Discharge Summary (Signed)
Physician Discharge Summary  Renee Vance EYC:[id number] DOB: 06-Jan-1941 DOA: 10/28/2016  PCP: [name]  Admit date: 10/28/2016 Discharge date: 11/04/2016  Admitted From: Home Disposition:  Home  Recommendations for Outpatient Follow-up:  1. Follow up with PCP in 1 week 2. Follow up with pulmonology in 1 week 3. Wean off of oxygen 4. Repeat BMP since starting Lasix to watch creatinine and potassium  Equipment/Devices: Oxygen 4L  Discharge Condition: Guarded CODE STATUS: Full code  Brief/Interim Summary:  HPI written by Karmen Bongo, MD on 10/28/2016  Chief Complaint: SOB  HPI: Renee Vance is a 76 y.o. female with medical history significant of lung cancer (last chemotherapy was in 10/17), hypothyroidism, HLD, HTN, CAD, and COPD presenting because she had the flu, now she has pneumoia.  Breathing is getting bad.  Started feeling bad 2 weeks ago Sunday.  That Tuesday, she went to see PCP, treated with Tamiflu and inhaler.  Symptoms at that time - SOB, cough, pain from coughing, color of urine was wrong, fever to 102.  Symptoms did not really get better but since she has COPD she thought the ongoing SOB was okay.  Things have not been getting better and the breathing has been worse.  Not on home O2, no known hypoxia.  Productive cough - white or yellowish phlegm, no blood.  Bloody drainage with nose blowing.  Fever to 100.9.    Hospital course:  Acute respiratory failure with hypoxia Secondary to pneumonia and pulmonary edema. Started on empiric antibiotics and diuresis. Oxygen was weaned as tolerated, but patient needing oxygen at discharge. Will need follow-up with pulmonology to continue wean to room air.  Acute on chronic diastolic heart failure Contributed to patient's overall acute respiratory failure. Echo significant for an EF of 60-65% with grade 2 diastolic dysfunction. Diuresis was started which helped symptoms. Continue Lasix at  discharge  HCAP Initially on vancomycin and cefepime. Deescalated to cefepime and discharged on doxycycline. Respiratory status as above.  Acute blood loss anemia Gross hematuria. S/p 1u PRBC on 1/21. Likely secondary to coumadin. Urology follow-up outpatient.  Hemoptysis Streaky with sputum. Resolved.  Acute on chronic renal failure, stage 3 Renal ultrasound significant for right collecting system fullness without hydronephrosis. Creatinine stable. Patient sees nephrologist twice yearly at [facility]. Continued to hold losartan in setting of acute injury. Stopped at discharge. Consider restarting once creatinine stabilized.  Coagulopathy Secondary to coumadin. Initially held secondary to hematuria and acute blood loss anemia. Given 1u FFP on 10/29/16 for bleeding. Coumadin restarted once bleeding stopped.  Paroxysmal atrial fibrillation CHA2DS2-VASc Scoreis 5. In sinus rhythm. Coumadin initially held secondary to blood loss anemia. Restarted before discharge.  Essential hypertension Continued amlodipine and metoprolol succinate  COPD Stable. Continued bronchodilators  Squamous cell carcinoma of the lung, stage IIB Followed by Dr. Julien Nordmann  Hyperlipidemia Continued statin  Right kidney lesion Seen on ultrasound. Complex cyst vs ?malignancy. Recommend follow-up MRI w/wo contrast. Patient can follow-up as an outpatient when kidney function has improved.  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)   Antineoplastic chemotherapy induced anemia   Acute on chronic respiratory failure (HCC)   Acute renal failure (HCC)   Acute renal failure superimposed on stage 3 chronic kidney disease ([place])   Acute blood loss  anemia   Coagulopathy (HCC)   Hematuria   Lobar pneumonia (HCC)   Acute respiratory failure with hypoxia (HCC)   Acute pulmonary edema (HCC)   HCAP (healthcare-associated pneumonia)   AKI (acute kidney injury) ([place])    Discharge Instructions   Allergies as of 11/04/2016      Reactions   Ampicillin Nausea And Vomiting   Has patient had a PCN reaction causing immediate rash, facial/tongue/throat swelling, SOB or lightheadedness with hypotension: no Has patient had a PCN reaction causing severe rash involving mucus membranes or skin necrosis: no Has patient had a PCN reaction that required hospitalization: no Has patient had a PCN reaction occurring within the last 10 years: no If all of the above answers are "NO", then may proceed with Cephalosporin use   Codeine Nausea And Vomiting      Medication List    STOP taking these medications   losartan 50 MG tablet Commonly known as:  COZAAR     TAKE these medications   amLODipine 5 MG tablet Commonly known as:  NORVASC Take 5 mg by mouth daily.   aspirin 81 MG tablet Take 81 mg by mouth daily.   atorvastatin 80 MG tablet Commonly known as:  LIPITOR Take 80 mg by mouth daily.   budesonide 0.5 MG/2ML nebulizer solution Commonly known as:  PULMICORT Take 2 mLs (0.5 mg total) by nebulization 2 (two) times daily.   dextromethorphan 30 MG/5ML liquid Commonly known as:  DELSYM Take 60 mg by mouth 2 (two) times daily.   doxycycline 100 MG EC tablet Commonly known as:  DORYX Take 1 tablet (100 mg total) by mouth 2 (two) times daily.   feeding supplement (ENSURE ENLIVE) Liqd Take 237 mLs by mouth 2 (two) times daily between meals.   furosemide 40 MG tablet Commonly known as:  LASIX Take 1 tablet (40 mg total) by mouth daily. Start taking on:  11/05/2016   ipratropium-albuterol 0.5-2.5 (3) MG/3ML Soln Commonly known as:  DUONEB Take 3 mLs by nebulization every 6 (six) hours as needed.   levothyroxine 125 MCG  tablet Commonly known as:  SYNTHROID, LEVOTHROID Take 125 mcg by mouth daily before breakfast.   metoprolol succinate 50 MG 24 hr tablet Commonly known as:  TOPROL-XL Take 1 tablet (50 mg total) by mouth 2 (two) times daily.   PROAIR RESPICLICK 998 (90 Base) MCG/ACT Aepb Generic drug:  Albuterol Sulfate Take 1-2 puffs by mouth daily as needed for wheezing or shortness of breath.   tiotropium 18 MCG inhalation capsule Commonly known as:  SPIRIVA Place 18 mcg into inhaler and inhale daily.   Vitamin D (Cholecalciferol) 1000 units Caps Take 1,000 Units by mouth daily.   warfarin 5 MG tablet Commonly known as:  COUMADIN Take 1/2 tablet daily except 1 tablet on Sundays and Thursdays What changed:  additional instructions            Durable Medical Equipment        Start     Ordered   11/04/16 1239  For home use only DME Nebulizer machine  Once    Question:  Patient needs a nebulizer to treat with the following condition  Answer:  COPD (chronic obstructive pulmonary disease) ([place])   11/04/16 1238   11/04/16 1228  For home use only DME oxygen  Once    Question Answer Comment  Mode or (Route) Nasal cannula   Liters per Minute 2   Frequency Continuous (stationary and portable oxygen unit needed)  Oxygen delivery system Gas      11/04/16 1227     Follow-up Information    ESKRIDGE, MATTHEW, MD Follow up.   Specialty:  Urology Why:  2-3 weeks  Contact information: [place] [address] [place], PA-C. Schedule an appointment as soon as possible for a visit in 1 week(s).   Specialty:  Physician Assistant Contact information: [place] [address] [place], MD. Schedule an appointment as soon as possible for a visit in 1 week(s).   Specialty:  Pulmonary Disease Contact information: [address] [place] [address] [phone]          Allergies   Allergen Reactions  . Ampicillin Nausea And Vomiting    Has patient had a PCN reaction causing immediate rash, facial/tongue/throat swelling, SOB or lightheadedness with hypotension: no Has patient had a PCN reaction causing severe rash involving mucus membranes or skin necrosis: no Has patient had a PCN reaction that required hospitalization: no Has patient had a PCN reaction occurring within the last 10 years: no If all of the above answers are "NO", then may proceed with Cephalosporin use  . Codeine Nausea And Vomiting    Consultations:  Critical care/Pulmonology   Procedures/Studies: Ct Abdomen Pelvis Wo Contrast  Result Date: 10/31/2016 CLINICAL DATA:  76 year old female with a history of 2 week history of cough and short of breath and weakness. Diagnosis of flu. History of hematuria. EXAM: CT ABDOMEN AND PELVIS WITHOUT CONTRAST TECHNIQUE: Multidetector CT imaging of the abdomen and pelvis was performed following the standard protocol without IV contrast. COMPARISON:  CT 09/19/2016, PET-CT 06/17/2016, CT abdomen 07/10/2013 FINDINGS: Lower chest: Bilateral ground-glass opacity of the visualized lung bases, new from the comparison studies of 2017. Trace left-sided pleural effusion. Calcifications of native coronary vasculature. Small hiatal hernia. Hepatobiliary: Unremarkable appearance of liver parenchyma. Surgical changes of cholecystectomy. Pancreas: Unremarkable appearance of pancreas. Spleen: Unremarkable spleen Adrenals/Urinary Tract: Unremarkable appearance of bilateral adrenal glands. No evidence of nephrolithiasis. No perinephric stranding. Unremarkable course the bilateral ureters. Mild dilation of the collecting system of the right greater than left renal pelvis, relatively unchanged from the PET-CT. Urinary catheter in position. Stomach/Bowel: Enteric contrast present within the small bowel and colon, extending to the descending colon and rectum. No abnormally distended small bowel  or colon. Normal appendix. Minimal diverticula. No associated inflammatory changes. Vascular/Lymphatic: Calcifications of the abdominal aorta and bilateral iliac vasculature. No aneurysm. Reproductive: Unremarkable appearance of uterus and the adnexa. Other: No abdominal wall hernia identified. Musculoskeletal: No displaced fracture. Multilevel degenerative changes of the thoracolumbar spine. Grade 1 anterolisthesis of L4 on L5. Vacuum disc phenomenon throughout the lower lumbar spine. Degenerative changes bilateral hips. IMPRESSION: No acute finding of the abdomen/pelvis CT. Since comparison CTs of December 2017, there is new ground-glass opacity of the bilateral lower visualized lungs, most suggestive of atypical infection given the history of a flu diagnosis. Trace left-sided pleural effusion. No evidence of nephrolithiasis or other finding of the urinary tract to explain the history of hematuria. Aortic atherosclerosis.  Associated iliac disease. Small hiatal hernia. Signed, Dulcy Fanny. Earleen Newport, DO [facility] Electronically Signed   By: Corrie Mckusick D.O.   On: 10/31/2016 18:34   Dg Chest 2 View  Result Date: 10/28/2016 CLINICAL DATA:  Dyspnea, cough, history of COPD and lung cancer. Ex-smoker. EXAM:  CHEST  2 VIEW COMPARISON:  CT from 09/19/2016 and CXR 05/29/2016 FINDINGS: The heart is top-normal in size. There is aortic atherosclerosis without aneurysm. Diffuse interstitial prominence is noted bilaterally left greater than right with superimposed airspace opacities about the left hilum. Findings may reflect pneumonia superimposed on interstitial lung disease, the interstitial prominence possibly related to bronchitis, post treatment change or fibrosis. Slight volume loss with tenting of left inferior pulmonary ligament. Lesser degree of interstitial markings at the right costophrenic angle and right upper lobe. There appears be a trace left effusion.  No pneumothorax. No acute osseous abnormality. IMPRESSION: Findings suspicious for left-sided perihilar pneumonia superimposed on interstitial lung disease. Follow-up therefore recommended to ensure clearance. Followup PA and lateral chest X-ray is recommended in 3-4 weeks following trial of antibiotic therapy to ensure resolution. Trace left effusion. Electronically Signed   By: Ashley Royalty M.D.   On: 10/28/2016 15:40   US Renal  Result Date: 10/29/2016 CLINICAL DATA:  Acute on chronic renal failure. History of squamous cell carcinoma of the left lung. EXAM: RENAL / URINARY TRACT ULTRASOUND COMPLETE COMPARISON:  CT of the abdomen and pelvis on 07/10/2013 FINDINGS: Right Kidney: Length: 10.6 cm. The right kidney demonstrates increased cortical echogenicity with poor corticomedullary differentiation. Findings are consistent with chronic kidney disease. Very mild fullness of the renal collecting system noted without significant hydronephrosis. No shadowing calculi identified. By ultrasound, a focal complex mass is identified in the medial aspect of the lower right kidney measuring approximately 2.3 x 2.2 x 2.2 cm. This appears predominately solid and partially cystic. Left Kidney: Length: 10.4 cm. The left kidney demonstrates increased cortical echogenicity consistent with chronic kidney disease. No hydronephrosis identified. Small simple cyst of the anterior interpolar kidney measures approximately 0.9 x 1.2 x 1.0 cm and has the appearance of a benign cyst. Bladder: Appears normal for degree of bladder distention. IMPRESSION: 1. Equal sized kidneys with increased cortical echogenicity bilaterally. Findings are consistent with known chronic kidney disease. 2. Mild fullness of the right renal collecting system without significant hydronephrosis. No evidence of left hydronephrosis. 3. 2.3 cm mass of the inferior medial right kidney which appearance predominately solid and partially cystic. Although this could  represent a complex cyst, malignancy is not excluded. In the setting of acute renal failure, the patient likely cannot receive iodinated contrast or gadolinium. Recommend imaging follow-up for the right renal lesion. Either unenhanced MRI of or MRI with and without gadolinium in the future would be the best way to evaluate the right renal mass. 4. Simple cyst of the left kidney appears benign. Electronically Signed   By: Aletta Edouard M.D.   On: 10/29/2016 11:12   Dg Chest Port 1 View  Result Date: 11/01/2016 CLINICAL DATA:  Pneumonia EXAM: PORTABLE CHEST 1 VIEW COMPARISON:  10/31/2016 FINDINGS: Diffuse pneumonia has improved. Heart remains prominent. No pneumothorax. More confluent consolidation at the left base is unchanged. IMPRESSION: Improving diffuse airspace disease. Electronically Signed   By: Marybelle Killings M.D.   On: 11/01/2016 06:55   Dg Chest Port 1 View  Result Date: 10/31/2016 CLINICAL DATA:  Acute respiratory failure with hypoxia. History of lung malignancy, COPD, current smoker. EXAM: PORTABLE CHEST 1 VIEW COMPARISON:  Portable chest x-ray of October 30, 2016 FINDINGS: The lungs are well-expanded. The interstitial markings remain increased diffusely. There remains partial obscuration of the left hemidiaphragm. A small left pleural effusion is suspected. The heart is top-normal in size. The pulmonary vascularity is indistinct but not clearly engorged. IMPRESSION:  Persistent diffuse interstitial infiltrates likely reflecting pneumonia. Probable small left pleural effusion. No significant change since yesterday's study. Electronically Signed   By: David  Martinique M.D.   On: 10/31/2016 07:03   Dg Chest Port 1 View  Result Date: 10/30/2016 CLINICAL DATA:  76 y/o  F; hypoxia. EXAM: PORTABLE CHEST 1 VIEW COMPARISON:  10/28/2016 chest radiograph. FINDINGS: Diffuse increase in left lung opacification and beginning opacification of the peripheries of the right lung. Stable cardiac silhouette given  projection and technique. Aortic atherosclerosis with calcification. Mild degenerative changes of the spine. IMPRESSION: Marked diffuse interval increase in left lung opacification and developing opacifications at the periphery of the right lung probably representing pneumonitis, atypical for pulmonary edema. The peripheral pattern suggests cryptogenic organizing pneumonia or eosinophilic pneumonia. Consider CT of chest for further characterization. Electronically Signed   By: Kristine Garbe M.D.   On: 10/30/2016 06:23     Echocardiogram 10/30/2016  Study Conclusions  - Left ventricle: The cavity size was normal. Wall thickness was   increased in a pattern of mild LVH. Systolic function was normal.   The estimated ejection fraction was in the range of 60% to 65%.   Wall motion was normal; there were no regional wall motion   abnormalities. Features are consistent with a pseudonormal left   ventricular filling pattern, with concomitant abnormal relaxation   and increased filling pressure (grade 2 diastolic dysfunction). - Aortic valve: Trileaflet; mildly calcified leaflets. Mean   gradient (S): 12 mm Hg. Peak gradient (S): 24 mm Hg. VTI ratio of   LVOT to aortic valve: 0.77. - Mitral valve: Calcified annulus. There was trivial regurgitation. - Left atrium: The atrium was at the upper limits of normal in   size. - Right atrium: Central venous pressure (est): 3 mm Hg. - Tricuspid valve: There was trivial regurgitation. - Pulmonary arteries: PA peak pressure: 28 mm Hg (S). - Pericardium, extracardiac: A prominent pericardial fat pad was   present.  Impressions:  - Moderate LVH with LVEF 60-65% and grade 2 diastolic dysfunction.   Upper normal atrial chamber size. Calcified mitral annulus with   trivial mitral regurgitation. Moderately sclerotic aortic valve.   Trivial tricuspid regurgitation with PASP 28 mmHg.  Subjective: No dyspnea or chest pain.  Discharge  Exam: Vitals:   11/03/16 2045 11/04/16 0647  BP: (!) 154/61 (!) 142/65  Pulse: 82 70  Resp: 18 18  Temp: 97.8 F (36.6 C) 98.3 F (36.8 C)   Vitals:   11/03/16 2045 11/04/16 0647 11/04/16 0826 11/04/16 1223  BP: (!) 154/61 (!) 142/65    Pulse: 82 70    Resp: 18 18    Temp: 97.8 F (36.6 C) 98.3 F (36.8 C)    TempSrc: Oral Oral    SpO2: 97% 97% 94% 94%  Weight:  65.3 kg (143 lb 15.4 oz)    Height:        General exam: Appears calm and comfortable Respiratory system: Mild bibasilar crackles with crackles at mid-left lung. No wheezing. Respiratory effort normal. Cardiovascular system: S1 & S2 heard, RRR. No murmurs, rubs, gallops or clicks. Gastrointestinal system: Abdomen is nondistended, soft and nontender. Normal bowel sounds heard but hypoactive Central nervous system: Alert and oriented. No focal neurological deficits. Extremities: No edema. No calf tenderness Skin: No cyanosis. No rashes Psychiatry: Judgement and insight appear normal. Mood & affect appropriate.   The results of significant diagnostics from this hospitalization (including imaging, microbiology, ancillary and laboratory) are listed below for reference.  Microbiology: Recent Results (from the past 240 hour(s))  Culture, Urine     Status: None   Collection Time: 10/29/16  1:32 AM  Result Value Ref Range Status   Specimen Description URINE, CLEAN CATCH  Final   Special Requests NONE  Final   Culture   Final    NO GROWTH Performed at [facility], [address] [place], [place] [address]    Report Status 10/30/2016 FINAL  Final  Culture, blood (routine x 2) Call MD if unable to obtain prior to antibiotics being given     Status: None   Collection Time: 10/29/16  3:59 AM  Result Value Ref Range Status   Specimen Description BLOOD BLOOD LEFT FOREARM  Final   Special Requests BOTTLES DRAWN AEROBIC ONLY 5ML  Final   Culture   Final    NO GROWTH 5 DAYS Performed at [facility],  [place] [address] [place], [place] [address]    Report Status 11/03/2016 FINAL  Final  Culture, blood (routine x 2) Call MD if unable to obtain prior to antibiotics being given     Status: None   Collection Time: 10/29/16  3:59 AM  Result Value Ref Range Status   Specimen Description BLOOD LEFT ANTECUBITAL  Final   Special Requests BOTTLES DRAWN AEROBIC ONLY 5ML  Final   Culture   Final    NO GROWTH 5 DAYS Performed at [facility], [place] [address], [place], [place] [address]    Report Status 11/03/2016 FINAL  Final  Respiratory Panel by PCR     Status: None   Collection Time: 10/29/16 11:03 AM  Result Value Ref Range Status   Adenovirus NOT DETECTED NOT DETECTED Final   Coronavirus 229E NOT DETECTED NOT DETECTED Final   Coronavirus HKU1 NOT DETECTED NOT DETECTED Final   Coronavirus NL63 NOT DETECTED NOT DETECTED Final   Coronavirus OC43 NOT DETECTED NOT DETECTED Final   Metapneumovirus NOT DETECTED NOT DETECTED Final   Rhinovirus / Enterovirus NOT DETECTED NOT DETECTED Final   Influenza A NOT DETECTED NOT DETECTED Final   Influenza B NOT DETECTED NOT DETECTED Final   Parainfluenza Virus 1 NOT DETECTED NOT DETECTED Final   Parainfluenza Virus 2 NOT DETECTED NOT DETECTED Final   Parainfluenza Virus 3 NOT DETECTED NOT DETECTED Final   Parainfluenza Virus 4 NOT DETECTED NOT DETECTED Final   Respiratory Syncytial Virus NOT DETECTED NOT DETECTED Final   Bordetella pertussis NOT DETECTED NOT DETECTED Final   Chlamydophila pneumoniae NOT DETECTED NOT DETECTED Final   Mycoplasma pneumoniae NOT DETECTED NOT DETECTED Final    Comment: Performed at [facility], [place] [address] [place], [place] [address]  MRSA PCR Screening     Status: None   Collection Time: 10/29/16 11:03 AM  Result Value Ref Range Status   MRSA by PCR NEGATIVE NEGATIVE Final    Comment:        The GeneXpert MRSA Assay (FDA approved for NASAL specimens only), is one component of a comprehensive MRSA  colonization surveillance program. It is not intended to diagnose MRSA infection nor to guide or monitor treatment for MRSA infections.   Culture, sputum-assessment     Status: None   Collection Time: 10/29/16 11:30 AM  Result Value Ref Range Status   Specimen Description SPUTUM  Final   Special Requests Normal  Final   Sputum evaluation THIS SPECIMEN IS ACCEPTABLE FOR SPUTUM CULTURE  Final   Report Status 10/29/2016 FINAL  Final  Culture, respiratory (NON-Expectorated)     Status: None   Collection Time: 10/29/16 11:30 AM  Result Value Ref Range Status   Specimen Description SPUTUM  Final   Special Requests Normal Reflexed from [phone]  Final   Gram Stain   Final    FEW WBC PRESENT, PREDOMINANTLY PMN MODERATE SQUAMOUS EPITHELIAL CELLS PRESENT MODERATE GRAM POSITIVE COCCI IN PAIRS FEW GRAM VARIABLE ROD RARE GRAM NEGATIVE COCCI    Culture   Final    Consistent with normal respiratory flora. Performed at [facility], [place] [address] [place], [place] [address]    Report Status 10/31/2016 FINAL  Final     Labs: BNP (last 3 results)  Recent Labs  10/30/16 0529  BNP 287.8*   Basic Metabolic Panel:  Recent Labs Lab 10/30/16 0524 10/31/16 0203 11/01/16 0338 11/02/16 0339 11/03/16 0516  NA 138 137 137 137 136  K 4.1 3.5 4.2 4.5 4.8  CL 103 99* 98* 99* 99*  CO2 '24 27 28 27 29  '$ GLUCOSE 163* 175* 142* 167* 105*  BUN 40* 45* 59* 68* 71*  CREATININE 2.57* 2.52* 2.54* 2.42* 2.20*  CALCIUM 8.5* 9.0 9.0 8.9 8.6*  MG  --   --  1.9 2.0  --    Liver Function Tests:  Recent Labs Lab 10/31/16 0203 11/01/16 0338  AST 39 97*  ALT 24 69*  ALKPHOS 81 78  BILITOT 1.0 1.0  PROT 6.8 6.5  ALBUMIN 2.6* 2.4*   No results for input(s): LIPASE, AMYLASE in the last 168 hours. No results for input(s): AMMONIA in the last 168 hours. CBC:  Recent Labs Lab 10/28/16 1602 10/29/16 0359 10/30/16 0524 10/31/16 0203 11/01/16 0338 11/02/16 0339  WBC 7.9 5.1 17.2* 17.7*  18.7* 14.5*  NEUTROABS 6.6 4.8  --   --   --   --   HGB 8.2* 8.1* 7.4* 8.6* 8.9* 8.6*  HCT 25.5* 24.9* 22.7* 25.5* 26.1* 26.8*  MCV 87.9 89.6 90.1 86.7 85.3 87.0  PLT 285 288 311 293 321 333   Cardiac Enzymes:  Recent Labs Lab 10/29/16 1043 10/30/16 1944 10/31/16 0203 10/31/16 0747  CKTOTAL 114  --   --   --   TROPONINI  --  0.09* 0.07* 0.06*   BNP: Invalid input(s): POCBNP CBG:  Recent Labs Lab 11/01/16 0813 11/01/16 1135  GLUCAP 133* 169*   D-Dimer No results for input(s): DDIMER in the last 72 hours. Hgb A1c No results for input(s): HGBA1C in the last 72 hours. Lipid Profile No results for input(s): CHOL, HDL, LDLCALC, TRIG, CHOLHDL, LDLDIRECT in the last 72 hours. Thyroid function studies No results for input(s): TSH, T4TOTAL, T3FREE, THYROIDAB in the last 72 hours.  Invalid input(s): FREET3 Anemia work up No results for input(s): VITAMINB12, FOLATE, FERRITIN, TIBC, IRON, RETICCTPCT in the last 72 hours. Urinalysis    Component Value Date/Time   COLORURINE YELLOW 10/28/2016 1649   APPEARANCEUR CLEAR 10/28/2016 1649   LABSPEC 1.004 (L) 10/28/2016 1649   PHURINE 5.0 10/28/2016 1649   GLUCOSEU NEGATIVE 10/28/2016 1649   HGBUR LARGE (A) 10/28/2016 1649   BILIRUBINUR NEGATIVE 10/28/2016 1649   KETONESUR NEGATIVE 10/28/2016 1649   PROTEINUR 30 (A) 10/28/2016 1649   NITRITE NEGATIVE 10/28/2016 1649   LEUKOCYTESUR TRACE (A) 10/28/2016 1649   Sepsis Labs Invalid input(s): PROCALCITONIN,  WBC,  LACTICIDVEN Microbiology Recent Results (from the past 240 hour(s))  Culture, Urine     Status: None   Collection Time: 10/29/16  1:32 AM  Result Value Ref Range Status   Specimen Description URINE, CLEAN CATCH  Final   Special Requests NONE  Final   Culture   Final    NO GROWTH Performed at [facility], [address] [place], [place] [address]    Report Status 10/30/2016 FINAL  Final  Culture, blood (routine x 2) Call MD if unable to obtain prior to  antibiotics being given     Status: None   Collection Time: 10/29/16  3:59 AM  Result Value Ref Range Status   Specimen Description BLOOD BLOOD LEFT FOREARM  Final   Special Requests BOTTLES DRAWN AEROBIC ONLY 5ML  Final   Culture   Final    NO GROWTH 5 DAYS Performed at [facility], [place] [address], [place], [place] [address]    Report Status 11/03/2016 FINAL  Final  Culture, blood (routine x 2) Call MD if unable to obtain prior to antibiotics being given     Status: None   Collection Time: 10/29/16  3:59 AM  Result Value Ref Range Status   Specimen Description BLOOD LEFT ANTECUBITAL  Final   Special Requests BOTTLES DRAWN AEROBIC ONLY 5ML  Final   Culture   Final    NO GROWTH 5 DAYS Performed at [facility], [place] [address] [place], [place] [address]    Report Status 11/03/2016 FINAL  Final  Respiratory Panel by PCR     Status: None   Collection Time: 10/29/16 11:03 AM  Result Value Ref [place] Status   Adenovirus NOT DETECTED NOT DETECTED Final   Coronavirus 229E NOT DETECTED NOT DETECTED Final   Coronavirus HKU1 NOT DETECTED NOT DETECTED Final   Coronavirus NL63 NOT DETECTED NOT DETECTED Final   Coronavirus OC43 NOT DETECTED NOT DETECTED Final   Metapneumovirus NOT DETECTED NOT DETECTED Final   Rhinovirus / Enterovirus NOT DETECTED NOT DETECTED Final   Influenza A NOT DETECTED NOT DETECTED Final   Influenza B NOT DETECTED NOT DETECTED Final   Parainfluenza Virus 1 NOT DETECTED NOT DETECTED Final   Parainfluenza Virus 2 NOT DETECTED NOT DETECTED Final   Parainfluenza Virus 3 NOT DETECTED NOT DETECTED Final   Parainfluenza Virus 4 NOT DETECTED NOT DETECTED Final   Respiratory Syncytial Virus NOT DETECTED NOT DETECTED Final   Bordetella pertussis NOT DETECTED NOT DETECTED Final   Chlamydophila pneumoniae NOT DETECTED NOT DETECTED Final   Mycoplasma pneumoniae NOT DETECTED NOT DETECTED Final    Comment: Performed at [facility], [name] [address],  [place], [place] [address]  MRSA PCR Screening     Status: None   Collection Time: 10/29/16 11:03 AM  Result Value Ref Range Status   MRSA by PCR NEGATIVE NEGATIVE Final    Comment:        The GeneXpert MRSA Assay (FDA approved for NASAL specimens only), is one component of a comprehensive MRSA colonization surveillance program. It is not intended to diagnose MRSA infection nor to guide or monitor treatment for MRSA infections.   Culture, sputum-assessment     Status: None   Collection Time: 10/29/16 11:30 AM  Result Value Ref [place] Status   Specimen Description SPUTUM  Final   Special Requests Normal  Final   Sputum evaluation THIS SPECIMEN IS ACCEPTABLE FOR SPUTUM CULTURE  Final   Report Status 10/29/2016 FINAL  Final  Culture, respiratory (NON-Expectorated)     Status: None   Collection Time: 10/29/16 11:30 AM  Result Value Ref [place] Status  Specimen Description SPUTUM  Final   Special Requests Normal Reflexed from [id number]  Final   Gram Stain   Final    FEW WBC PRESENT, PREDOMINANTLY PMN MODERATE SQUAMOUS EPITHELIAL CELLS PRESENT MODERATE GRAM POSITIVE COCCI IN PAIRS FEW GRAM VARIABLE ROD RARE GRAM NEGATIVE COCCI    Culture   Final    Consistent with normal respiratory flora. Performed at [facility], [place] [address], [place], [place] [address]    Report Status 10/31/2016 FINAL  Final     Time coordinating discharge: Over 30 minutes  SIGNED:   Cordelia Poche, MD [facility] 11/04/2016, 12:38 PM Pager [phone]  If 7PM-7AM, please contact night-coverage [email or web address] Password [email or web address]

## 2016-11-04 NOTE — Progress Notes (Signed)
Went over discharge paperwork with patient and family.  Paperwork given to patient.  All questions answerd.  Pt wheeled out via wheelchair.

## 2016-11-04 NOTE — Evaluation (Addendum)
Physical Therapy Evaluation Patient Details Name: Renee Vance MRN: [id number] DOB: 1941/06/24 Today's Date: 11/04/2016    SATURATION QUALIFICATIONS: (This note is used to comply with regulatory documentation for home oxygen)  Patient Saturations on Room Air at Rest =  Did not test (asked nursing to check)  Patient Saturations on Room Air while Ambulating = N/A  Patient Saturations on 2; 4 Liters of oxygen while Ambulating = 76%; 82%    History of Present Illness  76 yo female dmitted with pna. Hx of lung ca, htn, cad, copd, recent flu diagnosis  Clinical Impression  On eval, pt required Min guard assist for mobility. She walked ~100 feet. Mildly unsteady at times. Pt politely declines HHPT follow and I agree. Feel pt will be fine with husband assisting as needed at home. Educated on energy conservation techniques. Spoke with nursing to see if they could assess O2 sats at rest however pt clearly requirs O2 for activity.     Follow Up Recommendations No PT follow up;Supervision for mobility/OOB    Equipment Recommendations  None recommended by PT    Recommendations for Other Services       Precautions / Restrictions Precautions Precautions: Fall Precaution Comments: monitor O2 sats Restrictions Weight Bearing Restrictions: No      Mobility  Bed Mobility Overal bed mobility: Modified Independent                Transfers Overall transfer level: Modified independent                  Ambulation/Gait Ambulation/Gait assistance: Min guard Ambulation Distance (Feet): 100 Feet Assistive device: None (hallway handrail intermittent) Gait Pattern/deviations: Step-through pattern;Decreased stride length     General Gait Details: mildly unsteady at times. O2 sats 76% on 2L O2, 82% on 4L O2 during ambulation. Seated rest breaks taken during session to aid in recovery.   Stairs            Wheelchair Mobility    Modified Rankin (Stroke Patients  Only)       Balance Overall balance assessment: Needs assistance           Standing balance-Leahy Scale: Good                               Pertinent Vitals/Pain Pain Assessment: No/denies pain    Home Living Family/patient expects to be discharged to:: Private residence Living Arrangements: Spouse/significant other   Type of Home: House Home Access: Stairs to enter Entrance Stairs-Rails: None Technical brewer of Steps: 1 Home Layout: Two level;Bed/bath upstairs Home Equipment: None      Prior Function Level of Independence: Independent               Hand Dominance        Extremity/Trunk Assessment   Upper Extremity Assessment Upper Extremity Assessment: Overall WFL for tasks assessed    Lower Extremity Assessment Lower Extremity Assessment: Generalized weakness    Cervical / Trunk Assessment Cervical / Trunk Assessment: Normal  Communication      Cognition Arousal/Alertness: Awake/alert Behavior During Therapy: WFL for tasks assessed/performed Overall Cognitive Status: Within Functional Limits for tasks assessed                      General Comments      Exercises     Assessment/Plan    PT Assessment Patent does not need any further PT services  PT Problem List            PT Treatment Interventions      PT Goals (Current goals can be found in the Care Plan section)  Acute Rehab PT Goals Patient Stated Goal: to get off of O2 PT Goal Formulation: All assessment and education complete, DC therapy    Frequency     Barriers to discharge        Co-evaluation               End of Session Equipment Utilized During Treatment: Gait belt;Oxygen Activity Tolerance: Other (comment) (Limited by O2 sats, dyspnea) Patient left: in bed;with call bell/phone within reach           Time: 0940-0959 PT Time Calculation (min) (ACUTE ONLY): 19 min   Charges:   PT Evaluation $PT Eval Low Complexity: 1  Procedure     PT G Codes:        Weston Anna, MPT Pager: [phone]  Addendum: Spoke with RN about checking O2 sat levels at rest and with ambulation again. There is a question as to the accuracy of the portable pulse ox that I used. Feel it is best to reassess O2 levels prior to d/c.   Weston Anna, MPT

## 2016-11-04 NOTE — Progress Notes (Signed)
ANTICOAGULATION CONSULT NOTE -  Pharmacy Consult for warfarin Indication: atrial fibrillation  Allergies  Allergen Reactions  . Ampicillin Nausea And Vomiting    Has patient had a PCN reaction causing immediate rash, facial/tongue/throat swelling, SOB or lightheadedness with hypotension: no Has patient had a PCN reaction causing severe rash involving mucus membranes or skin necrosis: no Has patient had a PCN reaction that required hospitalization: no Has patient had a PCN reaction occurring within the last 10 years: no If all of the above answers are "NO", then may proceed with Cephalosporin use  . Codeine Nausea And Vomiting    Patient Measurements: Height: '5\' 4"'$  (162.6 cm) Weight: 143 lb 15.4 oz (65.3 kg) IBW/kg (Calculated) : 54.7   Vital Signs: Temp: 98.3 F (36.8 C) (01/26 0647) Temp Source: Oral (01/26 0647) BP: 142/65 (01/26 0647) Pulse Rate: 70 (01/26 0647)  Labs:  Recent Labs  11/02/16 0339 11/03/16 0516 11/04/16 0517  HGB 8.6*  --   --   HCT 26.8*  --   --   PLT 333  --   --   LABPROT 18.8* 16.2* 15.6*  INR 1.56 1.29 1.23  CREATININE 2.42* 2.20*  --     Estimated Creatinine Clearance: 18.5 mL/min (by C-G formula based on SCr of 2.2 mg/dL (H)).   Medical History: Past Medical History:  Diagnosis Date  . 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 Bradycardia   . CAD (coronary artery disease)   . COPD (chronic obstructive pulmonary disease) ([place])   . Coronary atherosclerosis of native coronary artery 2011  . Encounter for antineoplastic chemotherapy 06/23/2016  . Fibrocystic breast   . History of radiation therapy 07/05/16 - 08/09/16    Left Lung: 45 Gy in 25 fractions  . HTN (hypertension)   . Hyperlipidemia   . Hypothyroidism   . Obesity   . Pain in joint   . 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 female with a history of stage II squamous cell carcinoma of the lung, hypertension,  hyperlipidemia, COPD, coronary artery disease presenting with 2 week history of coughing, shortness of breath, and generalized weakness.   PTA coumadin initially held secondary to hematuria and acute blood loss anemia. Given 1u FFP on 10/29/16.  -coumadin per pharmacy  Home dose 2.'5mg'$  on Sun, Tues, Thurs and '5mg'$  all other days.  Of note did decrease dose to 2.'5mg'$  daily while on azithromycin, finished on 1/20.  11/04/2016  INR 1.23  Scr 2.2, CrCl~ 62ms/min  Consuming 90% of regular diet  DI: none noted  Goal of Therapy:  INR 2-3 Monitor platelets by anticoagulation protocol   Plan:  Warfarin '5mg'$  po x 1 tonight Daily INR  EDolly RiasRPh 11/04/2016, 11:38 AM Pager [phone]

## 2016-11-04 NOTE — Discharge Instructions (Signed)
Renee Vance,  Your admitted because of trouble breathing. Your found to have a pneumonia and increased fluid in your lungs. They're given antibiotics and medication to get the fluid out of her body. He will continue the antibiotics when he leaves the hospital. Because of August, you have to be placed on oxygen. We attempted to get she down as low as possible, but you are still requiring oxygen and will leave the hospital with oxygen. Please follow-up with your primary care physician and the pulmonologist for weaning off the oxygen.

## 2016-11-07 ENCOUNTER — Telehealth: Payer: Self-pay | Admitting: Medical Oncology

## 2016-11-07 NOTE — Telephone Encounter (Signed)
Asking if pt can received chemo while she is on oxygen and do they supply the oxygen. I told him she can take chemo while on oxygen and we will supply her oxygen.

## 2016-11-08 ENCOUNTER — Ambulatory Visit: Payer: PPO | Admitting: Radiation Oncology

## 2016-11-08 DIAGNOSIS — N179 Acute kidney failure, unspecified: Secondary | ICD-10-CM | POA: Diagnosis not present

## 2016-11-08 DIAGNOSIS — J9600 Acute respiratory failure, unspecified whether with hypoxia or hypercapnia: Secondary | ICD-10-CM | POA: Diagnosis not present

## 2016-11-08 DIAGNOSIS — J449 Chronic obstructive pulmonary disease, unspecified: Secondary | ICD-10-CM | POA: Diagnosis not present

## 2016-11-08 DIAGNOSIS — J189 Pneumonia, unspecified organism: Secondary | ICD-10-CM | POA: Diagnosis not present

## 2016-11-08 DIAGNOSIS — D62 Acute posthemorrhagic anemia: Secondary | ICD-10-CM | POA: Diagnosis not present

## 2016-11-10 ENCOUNTER — Encounter: Payer: Self-pay | Admitting: Internal Medicine

## 2016-11-10 ENCOUNTER — Ambulatory Visit: Payer: PPO | Admitting: Physical Therapy

## 2016-11-10 ENCOUNTER — Ambulatory Visit (INDEPENDENT_AMBULATORY_CARE_PROVIDER_SITE_OTHER): Payer: PPO | Admitting: *Deleted

## 2016-11-10 ENCOUNTER — Ambulatory Visit (HOSPITAL_BASED_OUTPATIENT_CLINIC_OR_DEPARTMENT_OTHER): Payer: PPO | Admitting: Internal Medicine

## 2016-11-10 VITALS — BP 93/52 | HR 63 | Temp 97.8°F | Resp 17 | Ht 64.0 in | Wt 144.1 lb

## 2016-11-10 DIAGNOSIS — J449 Chronic obstructive pulmonary disease, unspecified: Secondary | ICD-10-CM | POA: Diagnosis not present

## 2016-11-10 DIAGNOSIS — I480 Paroxysmal atrial fibrillation: Secondary | ICD-10-CM | POA: Diagnosis not present

## 2016-11-10 DIAGNOSIS — I10 Essential (primary) hypertension: Secondary | ICD-10-CM | POA: Diagnosis not present

## 2016-11-10 DIAGNOSIS — Z5181 Encounter for therapeutic drug level monitoring: Secondary | ICD-10-CM

## 2016-11-10 DIAGNOSIS — C3492 Malignant neoplasm of unspecified part of left bronchus or lung: Secondary | ICD-10-CM | POA: Diagnosis not present

## 2016-11-10 DIAGNOSIS — J438 Other emphysema: Secondary | ICD-10-CM

## 2016-11-10 DIAGNOSIS — Z5111 Encounter for antineoplastic chemotherapy: Secondary | ICD-10-CM

## 2016-11-10 LAB — POCT INR: INR: 3.2

## 2016-11-10 NOTE — Progress Notes (Signed)
[place] [phone]   [phone]  OFFICE PROGRESS NOTE  [name], PA-C [place] [address]  DIAGNOSIS: Stage IIB (T2a, N1, M0) non-small cell lung cancer, squamous cell carcinoma presented with left hilar mass diagnosed in August 2017.  PRIOR THERAPY: Concurrent chemoradiation with weekly carboplatin for AUC of 2 and paclitaxel 45 MG/M2 started 07/05/2016, status post 5 cycles. Last cycle was given 08/01/2016.  CURRENT THERAPY: Observation.  INTERVAL HISTORY: Renee Vance 76 y.o. female came to the clinic today for follow-up visit accompanied by her husband. The patient is complaining of shortness of breath and she is currently on home oxygen. She was [facility]2016 with acute respiratory failure and hypoxemia secondary to pneumonia and pulmonary edema. The patient was treated with empiric antibiotics and diuresis. She also had acute on chronic diastolic heart failure. She has COPD exacerbation at the same time. She is feeling a little bit better but continues to have shortness breath at baseline and increased with exertion. She denied having any chest pain or hemoptysis. She was seen a few weeks ago by Dr. Roxan Hockey for evaluation of surgical resection and the patient was not a good surgical candidate. It was recommended for her to complete the course of concurrent chemoradiation and she is here today for discussion of this option. She denied having any fever or chills. She has no nausea, vomiting, diarrhea or constipation. She lost a few pounds recently. She denied having any headache or visual changes.  MEDICAL HISTORY: Past Medical History:  Diagnosis Date  . 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 Bradycardia   . CAD (coronary artery disease)   . COPD (chronic obstructive pulmonary disease) ([place])   . Coronary atherosclerosis of native coronary artery 2011  .  Encounter for antineoplastic chemotherapy 06/23/2016  . Fibrocystic breast   . History of radiation therapy 07/05/16 - 08/09/16    Left Lung: 45 Gy in 25 fractions  . HTN (hypertension)   . Hyperlipidemia   . Hypothyroidism   . Obesity   . Pain in joint   . Stage II squamous cell carcinoma of left lung ([place]) 06/23/2016    ALLERGIES:  is allergic to ampicillin and codeine.  MEDICATIONS:  Current Outpatient Prescriptions  Medication Sig Dispense Refill  . amLODipine (NORVASC) 5 MG tablet Take 5 mg by mouth daily.    Marland Kitchen aspirin 81 MG tablet Take 81 mg by mouth daily.    Marland Kitchen atorvastatin (LIPITOR) 80 MG tablet Take 80 mg by mouth daily.    . budesonide (PULMICORT) 0.5 MG/2ML nebulizer solution Take 2 mLs (0.5 mg total) by nebulization 2 (two) times daily. 2 mL 60  . dextromethorphan (DELSYM) 30 MG/5ML liquid Take 60 mg by mouth 2 (two) times daily.     Marland Kitchen doxycycline (DORYX) 100 MG EC tablet     . feeding supplement, ENSURE ENLIVE, (ENSURE ENLIVE) LIQD Take 237 mLs by mouth 2 (two) times daily between meals. 237 mL 60  . furosemide (LASIX) 40 MG tablet Take 1 tablet (40 mg total) by mouth daily. 30 tablet 0  . ipratropium-albuterol (DUONEB) 0.5-2.5 (3) MG/3ML SOLN Take 3 mLs by nebulization every 6 (six) hours as needed. 360 mL 0  . levothyroxine (SYNTHROID, LEVOTHROID) 125 MCG tablet Take 125 mcg by mouth daily before breakfast.     . metoprolol succinate (TOPROL-XL) 50 MG 24 hr tablet Take 1 tablet (50 mg total) by  mouth 2 (two) times daily. 180 tablet 1  . PROAIR RESPICLICK 825 (90 Base) MCG/ACT AEPB Take 1-2 puffs by mouth daily as needed for wheezing or shortness of breath.    . tiotropium (SPIRIVA) 18 MCG inhalation capsule Place 18 mcg into inhaler and inhale daily.    . Vitamin D, Cholecalciferol, 1000 UNITS CAPS Take 1,000 Units by mouth daily.     Marland Kitchen warfarin (COUMADIN) 5 MG tablet Take 1/2 tablet daily except 1 tablet on Sundays and Thursdays (Patient taking differently: Take 2.'5mg'$   Sunday, Tuesday, Thursday. Take '5mg'$  by mouth on Monday, Wednesday, Friday, Saturday) 90 tablet 3   No current facility-administered medications for this visit.     SURGICAL HISTORY:  Past Surgical History:  Procedure Laterality Date  . CHOLECYSTECTOMY    . CORONARY ANGIOPLASTY WITH STENT PLACEMENT  2011   [place] 30-40%, LAD 65-75% (FFR 0.88), CFX 55-60%, RCA 95%>0 w/ 2.5 x 12 mm monorail stent  . TUBAL LIGATION    . VIDEO BRONCHOSCOPY Bilateral 06/07/2016   Procedure: VIDEO BRONCHOSCOPY WITH FLUORO;  Surgeon: [name], MD;  Location: [facility];  Service: Cardiopulmonary;  Laterality: Bilateral;    REVIEW OF SYSTEMS:  Constitutional: positive for fatigue and weight loss Eyes: negative Ears, nose, mouth, throat, and face: negative Respiratory: positive for cough, dyspnea on exertion and wheezing Cardiovascular: negative Gastrointestinal: negative Genitourinary:negative Integument/breast: negative Hematologic/lymphatic: negative Musculoskeletal:negative Neurological: negative Behavioral/Psych: negative Endocrine: negative Allergic/Immunologic: negative   PHYSICAL EXAMINATION: General appearance: alert, cooperative, fatigued and no distress Head: Normocephalic, without obvious abnormality, atraumatic Neck: no adenopathy, no JVD, supple, symmetrical, trachea midline and thyroid not enlarged, symmetric, no tenderness/mass/nodules Lymph nodes: Cervical, supraclavicular, and axillary nodes normal. Resp: rhonchi bilaterally and wheezes bilaterally Back: symmetric, no curvature. ROM normal. No CVA tenderness. Cardio: regular rate and rhythm, S1, S2 normal, no murmur, click, rub or gallop GI: soft, non-tender; bowel sounds normal; no masses,  no organomegaly Extremities: extremities normal, atraumatic, no cyanosis or edema Neurologic: Alert and oriented X 3, normal strength and tone. Normal symmetric reflexes. Normal coordination and gait  ECOG PERFORMANCE STATUS: 1 - Symptomatic  but completely ambulatory  Blood pressure (!) 93/52, pulse 63, temperature 97.8 F (36.6 C), temperature source Oral, resp. rate 17, height '5\' 4"'$  (1.626 m), weight 144 lb 1.6 oz (65.4 kg), SpO2 100 %.  LABORATORY DATA: Lab Results  Component Value Date   WBC 14.5 (H) 11/02/2016   HGB 8.6 (L) 11/02/2016   HCT 26.8 (L) 11/02/2016   MCV 87.0 11/02/2016   PLT 333 11/02/2016      Chemistry      Component Value Date/Time   NA 136 11/03/2016 0516   NA 142 09/19/2016 1141   K 4.8 11/03/2016 0516   K 4.3 09/19/2016 1141   CL 99 (L) 11/03/2016 0516   CO2 29 11/03/2016 0516   CO2 26 09/19/2016 1141   BUN 71 (H) 11/03/2016 0516   BUN 22.6 09/19/2016 1141   CREATININE 2.20 (H) 11/03/2016 0516   CREATININE 1.4 (H) 09/19/2016 1141      Component Value Date/Time   CALCIUM 8.6 (L) 11/03/2016 0516   CALCIUM 9.7 09/19/2016 1141   ALKPHOS 78 11/01/2016 0338   ALKPHOS 86 09/19/2016 1141   AST 97 (H) 11/01/2016 0338   AST 25 09/19/2016 1141   ALT 69 (H) 11/01/2016 0338   ALT 23 09/19/2016 1141   BILITOT 1.0 11/01/2016 0338   BILITOT 0.57 09/19/2016 1141       RADIOGRAPHIC STUDIES: Ct  Abdomen Pelvis Wo Contrast  Result Date: 10/31/2016 CLINICAL DATA:  76 year old female with a history of 2 week history of cough and short of breath and weakness. Diagnosis of flu. History of hematuria. EXAM: CT ABDOMEN AND PELVIS WITHOUT CONTRAST TECHNIQUE: Multidetector CT imaging of the abdomen and pelvis was performed following the standard protocol without IV contrast. COMPARISON:  CT 09/19/2016, PET-CT 06/17/2016, CT abdomen 07/10/2013 FINDINGS: Lower chest: Bilateral ground-glass opacity of the visualized lung bases, new from the comparison studies of 2017. Trace left-sided pleural effusion. Calcifications of native coronary vasculature. Small hiatal hernia. Hepatobiliary: Unremarkable appearance of liver parenchyma. Surgical changes of cholecystectomy. Pancreas: Unremarkable appearance of pancreas.  Spleen: Unremarkable spleen Adrenals/Urinary Tract: Unremarkable appearance of bilateral adrenal glands. No evidence of nephrolithiasis. No perinephric stranding. Unremarkable course the bilateral ureters. Mild dilation of the collecting system of the right greater than left renal pelvis, relatively unchanged from the PET-CT. Urinary catheter in position. Stomach/Bowel: Enteric contrast present within the small bowel and colon, extending to the descending colon and rectum. No abnormally distended small bowel or colon. Normal appendix. Minimal diverticula. No associated inflammatory changes. Vascular/Lymphatic: Calcifications of the abdominal aorta and bilateral iliac vasculature. No aneurysm. Reproductive: Unremarkable appearance of uterus and the adnexa. Other: No abdominal wall hernia identified. Musculoskeletal: No displaced fracture. Multilevel degenerative changes of the thoracolumbar spine. Grade 1 anterolisthesis of L4 on L5. Vacuum disc phenomenon throughout the lower lumbar spine. Degenerative changes bilateral hips. IMPRESSION: No acute finding of the abdomen/pelvis CT. Since comparison CTs of December 2017, there is new ground-glass opacity of the bilateral lower visualized lungs, most suggestive of atypical infection given the history of a flu diagnosis. Trace left-sided pleural effusion. No evidence of nephrolithiasis or other finding of the urinary tract to explain the history of hematuria. Aortic atherosclerosis.  Associated iliac disease. Small hiatal hernia. Signed, Dulcy Fanny. Earleen Newport, DO [facility] Radiology Electronically Signed   By: Corrie Mckusick D.O.   On: 10/31/2016 18:34   Dg Chest 2 View  Result Date: 10/28/2016 CLINICAL DATA:  Dyspnea, cough, history of COPD and lung cancer. Ex-smoker. EXAM: CHEST  2 VIEW COMPARISON:  CT from 09/19/2016 and CXR 05/29/2016 FINDINGS: The heart is top-normal in size. There is aortic atherosclerosis without  aneurysm. Diffuse interstitial prominence is noted bilaterally left greater than right with superimposed airspace opacities about the left hilum. Findings may reflect pneumonia superimposed on interstitial lung disease, the interstitial prominence possibly related to bronchitis, post treatment change or fibrosis. Slight volume loss with tenting of left inferior pulmonary ligament. Lesser degree of interstitial markings at the right costophrenic angle and right upper lobe. There appears be a trace left effusion. No pneumothorax. No acute osseous abnormality. IMPRESSION: Findings suspicious for left-sided perihilar pneumonia superimposed on interstitial lung disease. Follow-up therefore recommended to ensure clearance. Followup PA and lateral chest X-ray is recommended in 3-4 weeks following trial of antibiotic therapy to ensure resolution. Trace left effusion. Electronically Signed   By: Ashley Royalty M.D.   On: 10/28/2016 15:40   US Renal  Result Date: 10/29/2016 CLINICAL DATA:  Acute on chronic renal failure. History of squamous cell carcinoma of the left lung. EXAM: RENAL / URINARY TRACT ULTRASOUND COMPLETE COMPARISON:  CT of the abdomen and pelvis on 07/10/2013 FINDINGS: Right Kidney: Length: 10.6 cm. The right kidney demonstrates increased cortical echogenicity with poor corticomedullary differentiation. Findings are consistent with chronic kidney disease. Very mild fullness of the renal collecting system noted without significant hydronephrosis. No shadowing  calculi identified. By ultrasound, a focal complex mass is identified in the medial aspect of the lower right kidney measuring approximately 2.3 x 2.2 x 2.2 cm. This appears predominately solid and partially cystic. Left Kidney: Length: 10.4 cm. The left kidney demonstrates increased cortical echogenicity consistent with chronic kidney disease. No hydronephrosis identified. Small simple cyst of the anterior interpolar kidney measures approximately 0.9 x  1.2 x 1.0 cm and has the appearance of a benign cyst. Bladder: Appears normal for degree of bladder distention. IMPRESSION: 1. Equal sized kidneys with increased cortical echogenicity bilaterally. Findings are consistent with known chronic kidney disease. 2. Mild fullness of the right renal collecting system without significant hydronephrosis. No evidence of left hydronephrosis. 3. 2.3 cm mass of the inferior medial right kidney which appearance predominately solid and partially cystic. Although this could represent a complex cyst, malignancy is not excluded. In the setting of acute renal failure, the patient likely cannot receive iodinated contrast or gadolinium. Recommend imaging follow-up for the right renal lesion. Either unenhanced MRI of or MRI with and without gadolinium in the future would be the best way to evaluate the right renal mass. 4. Simple cyst of the left kidney appears benign. Electronically Signed   By: Aletta Edouard M.D.   On: 10/29/2016 11:12   Dg Chest Port 1 View  Result Date: 11/01/2016 CLINICAL DATA:  Pneumonia EXAM: PORTABLE CHEST 1 VIEW COMPARISON:  10/31/2016 FINDINGS: Diffuse pneumonia has improved. Heart remains prominent. No pneumothorax. More confluent consolidation at the left base is unchanged. IMPRESSION: Improving diffuse airspace disease. Electronically Signed   By: Marybelle Killings M.D.   On: 11/01/2016 06:55   Dg Chest Port 1 View  Result Date: 10/31/2016 CLINICAL DATA:  Acute respiratory failure with hypoxia. History of lung malignancy, COPD, current smoker. EXAM: PORTABLE CHEST 1 VIEW COMPARISON:  Portable chest x-ray of October 30, 2016 FINDINGS: The lungs are well-expanded. The interstitial markings remain increased diffusely. There remains partial obscuration of the left hemidiaphragm. A small left pleural effusion is suspected. The heart is top-normal in size. The pulmonary vascularity is indistinct but not clearly engorged. IMPRESSION: Persistent diffuse  interstitial infiltrates likely reflecting pneumonia. Probable small left pleural effusion. No significant change since yesterday's study. Electronically Signed   By: David  Martinique M.D.   On: 10/31/2016 07:03   Dg Chest Port 1 View  Result Date: 10/30/2016 CLINICAL DATA:  76 y/o  F; hypoxia. EXAM: PORTABLE CHEST 1 VIEW COMPARISON:  10/28/2016 chest radiograph. FINDINGS: Diffuse increase in left lung opacification and beginning opacification of the peripheries of the right lung. Stable cardiac silhouette given projection and technique. Aortic atherosclerosis with calcification. Mild degenerative changes of the spine. IMPRESSION: Marked diffuse interval increase in left lung opacification and developing opacifications at the periphery of the right lung probably representing pneumonitis, atypical for pulmonary edema. The peripheral pattern suggests cryptogenic organizing pneumonia or eosinophilic pneumonia. Consider CT of chest for further characterization. Electronically Signed   By: Kristine Garbe M.D.   On: 10/30/2016 06:23    ASSESSMENT AND PLAN:  This is a very pleasant 76 years old white female with stage IIB non-small cell lung cancer, squamous cell carcinoma status post 5 weeks of concurrent chemoradiation with weekly carboplatin and paclitaxel with partial response. The patient was evaluated for surgical resection but unfortunately was not a good surgical candidate for resection. She is here today for reevaluation and consideration of completing the course of concurrent chemoradiation. 2 weeks ago the patient was admitted to [facility] with Acute respiratory failure secondary to pneumonia and congestive heart failure as well as COPD exacerbation. She is feeling a little bit better but slowly recovering from her recent admission. I had a lengthy discussion with the patient today about her condition. I'm not sure the patient is strong enough to resume the last 2 weeks of her  concurrent chemoradiation. I'm concerned about worsening of her respiratory status with the radiotherapy. She is seeing Dr. Sondra Come next week and if they are in agreement to resume the course of concurrent chemoradiation, I will arrange for the patient to have chemotherapy on 11/21/2016 and 11/28/2016. The patient already has a follow-up appointment and repeat scan in March 2018. I will see her back for follow-up visit at that time. For COPD, she will continue her treatment with Spiriva, Pulmicort, DuoNeb and ProAir. For hypertension, the patient will continue her current treatment with Toprol-XL The patient and her husband are in agreement with the current plan. She was advised to call immediately if she has any concerning symptoms in the interval.  The patient voices understanding of current disease status and treatment options and is in agreement with the current care plan.  All questions were answered. The patient knows to call the clinic with any problems, questions or concerns. We can certainly see the patient much sooner if necessary.  Disclaimer: This note was dictated with voice recognition software. Similar sounding words can inadvertently be transcribed and may not be corrected upon review.

## 2016-11-14 ENCOUNTER — Encounter: Payer: Self-pay | Admitting: *Deleted

## 2016-11-14 DIAGNOSIS — D631 Anemia in chronic kidney disease: Secondary | ICD-10-CM | POA: Diagnosis not present

## 2016-11-14 DIAGNOSIS — E559 Vitamin D deficiency, unspecified: Secondary | ICD-10-CM | POA: Diagnosis not present

## 2016-11-14 DIAGNOSIS — C3492 Malignant neoplasm of unspecified part of left bronchus or lung: Secondary | ICD-10-CM | POA: Diagnosis not present

## 2016-11-14 DIAGNOSIS — E889 Metabolic disorder, unspecified: Secondary | ICD-10-CM | POA: Diagnosis not present

## 2016-11-14 DIAGNOSIS — N179 Acute kidney failure, unspecified: Secondary | ICD-10-CM | POA: Diagnosis not present

## 2016-11-14 DIAGNOSIS — J449 Chronic obstructive pulmonary disease, unspecified: Secondary | ICD-10-CM | POA: Diagnosis not present

## 2016-11-14 DIAGNOSIS — N183 Chronic kidney disease, stage 3 (moderate): Secondary | ICD-10-CM | POA: Diagnosis not present

## 2016-11-14 DIAGNOSIS — R3129 Other microscopic hematuria: Secondary | ICD-10-CM | POA: Diagnosis not present

## 2016-11-14 DIAGNOSIS — I129 Hypertensive chronic kidney disease with stage 1 through stage 4 chronic kidney disease, or unspecified chronic kidney disease: Secondary | ICD-10-CM | POA: Diagnosis not present

## 2016-11-14 DIAGNOSIS — M9080 Osteopathy in diseases classified elsewhere, unspecified site: Secondary | ICD-10-CM | POA: Diagnosis not present

## 2016-11-14 DIAGNOSIS — N281 Cyst of kidney, acquired: Secondary | ICD-10-CM | POA: Diagnosis not present

## 2016-11-14 NOTE — Progress Notes (Signed)
Oncology Nurse Navigator Documentation  Oncology Nurse Navigator Flowsheets 11/14/2016  Navigator Location [facility]-  Navigator Encounter Type Other/patient is not set up to see Rad Onc at this time. I notified Rad Onc to get her scheduled with them.   Barriers/Navigation Needs Coordination of Care  Interventions Coordination of Care  Coordination of Care Appts  Acuity Level 2  Time Spent with Patient 30

## 2016-11-15 ENCOUNTER — Ambulatory Visit
Admission: RE | Admit: 2016-11-15 | Discharge: 2016-11-15 | Disposition: A | Discharge status: Home / Self Care | Payer: PPO | Source: Ambulatory Visit | Attending: Radiation Oncology | Admitting: Radiation Oncology

## 2016-11-15 ENCOUNTER — Ambulatory Visit: Payer: PPO | Admitting: Radiation Oncology

## 2016-11-15 DIAGNOSIS — Z8701 Personal history of pneumonia (recurrent): Secondary | ICD-10-CM | POA: Diagnosis not present

## 2016-11-15 DIAGNOSIS — Z08 Encounter for follow-up examination after completed treatment for malignant neoplasm: Secondary | ICD-10-CM | POA: Diagnosis not present

## 2016-11-15 DIAGNOSIS — Z880 Allergy status to penicillin: Secondary | ICD-10-CM | POA: Insufficient documentation

## 2016-11-15 DIAGNOSIS — I509 Heart failure, unspecified: Secondary | ICD-10-CM | POA: Diagnosis not present

## 2016-11-15 DIAGNOSIS — Z79899 Other long term (current) drug therapy: Secondary | ICD-10-CM | POA: Insufficient documentation

## 2016-11-15 DIAGNOSIS — Z885 Allergy status to narcotic agent status: Secondary | ICD-10-CM | POA: Diagnosis not present

## 2016-11-15 DIAGNOSIS — Z7982 Long term (current) use of aspirin: Secondary | ICD-10-CM | POA: Insufficient documentation

## 2016-11-15 DIAGNOSIS — Z7901 Long term (current) use of anticoagulants: Secondary | ICD-10-CM | POA: Diagnosis not present

## 2016-11-15 DIAGNOSIS — C3492 Malignant neoplasm of unspecified part of left bronchus or lung: Secondary | ICD-10-CM

## 2016-11-15 DIAGNOSIS — N189 Chronic kidney disease, unspecified: Secondary | ICD-10-CM | POA: Insufficient documentation

## 2016-11-15 DIAGNOSIS — Z8709 Personal history of other diseases of the respiratory system: Secondary | ICD-10-CM | POA: Diagnosis not present

## 2016-11-15 NOTE — Progress Notes (Signed)
Maple is here for follow up.  She denies having pain.  She reports she has been feeling dizzy and was found to have orthostatic hypotension yesterday at her nephrologist's office.  Her lasix was decreased.  She is on 2L of oxygen.  She reports having an occasional cough.  BP (!) 119/51 (BP Location: Left Arm, Patient Position: Standing)   Pulse 81   Temp 98.2 F (36.8 C)   Ht '5\' 4"'$  (1.626 m)   Wt 143 lb 3.2 oz (65 kg)   SpO2 100% Comment: 2L  BMI 24.58 kg/m   Wt Readings from Last 3 Encounters:  11/15/16 143 lb 3.2 oz (65 kg)  11/10/16 144 lb 1.6 oz (65.4 kg)  11/04/16 143 lb 15.4 oz (65.3 kg)

## 2016-11-15 NOTE — Progress Notes (Signed)
Radiation Oncology         [phone] ________________________________  Name: Renee Vance MRN: [id number]  Date: 11/15/2016  DOB: December 11, 1940  Follow-Up Visit Note  CC: HEPLER,MARK, PA-C  Hepler, Mark, PA-C    ICD-9-CM ICD-10-CM   1. 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 The patient was discharged from the hospital on 11/04/16 for the flu and acute respiratory failure and hypoxemia secondary to pneumonia and pulmonary edema. She has since completed her course of antibiotics.  The patient saw Dr. Roxan Hockey for evaluation of surgical resection and the patient was determined not to be a good surgical candidate. The patient saw Dr. Julien Nordmann on 11/10/16 to discuss completing the course of concurrent chemoradiation. He was not sure if the patient would be strong enough to resume the last two weeks of concurrent radiotherapy given her recent hospitalization. The patient and her husband present today for my opinion regarding this issue.  She denies having pain. She reports she has been feeling dizzy and was found to have orthostatic hypotension yesterday at her nephrologist's office. Her lasix was decreased. She is on 2L of oxygen.  She reports having an occasional cough. She denies fever or chills.  ALLERGIES:  is allergic to ampicillin and codeine.  Meds: Current Outpatient Prescriptions  Medication Sig Dispense Refill  . amLODipine (NORVASC) 5 MG tablet Take 5 mg by mouth daily.    Marland Kitchen aspirin 81 MG tablet Take 81 mg by mouth daily.    Marland Kitchen atorvastatin (LIPITOR) 80 MG tablet Take 80 mg by mouth daily.    . budesonide (PULMICORT) 0.5 MG/2ML nebulizer solution Take 2 mLs (0.5 mg  total) by nebulization 2 (two) times daily. 2 mL 60  . dextromethorphan (DELSYM) 30 MG/5ML liquid Take 60 mg by mouth 2 (two) times daily.     . furosemide (LASIX) 40 MG tablet Take 1 tablet (40 mg total) by mouth daily. 30 tablet 0  . ipratropium-albuterol (DUONEB) 0.5-2.5 (3) MG/3ML SOLN Take 3 mLs by nebulization every 6 (six) hours as needed. 360 mL 0  . levothyroxine (SYNTHROID, LEVOTHROID) 125 MCG tablet Take 125 mcg by mouth daily before breakfast.     . metoprolol succinate (TOPROL-XL) 50 MG 24 hr tablet Take 1 tablet (50 mg total) by mouth 2 (two) times daily. 180 tablet 1  . PROAIR RESPICLICK 767 (90 Base) MCG/ACT AEPB Take 1-2 puffs by mouth daily as needed for wheezing or shortness of breath.    . tiotropium (SPIRIVA) 18 MCG inhalation capsule Place 18 mcg into inhaler and inhale daily.    . Vitamin D, Cholecalciferol, 1000 UNITS CAPS Take 1,000 Units by mouth daily.     Marland Kitchen warfarin (COUMADIN) 5 MG tablet Take 1/2 tablet daily except 1 tablet on Sundays and Thursdays (Patient taking differently: Take 2.'5mg'$  Sunday, Tuesday, Thursday. Take '5mg'$  by mouth on Monday, Wednesday, Friday, Saturday) 90 tablet 3   No current facility-administered medications for this encounter.     Physical Findings: The patient is in no acute distress. Patient is alert and oriented.  vitals were not taken for this visit..   2L of oxygen via nasal cannula. Mild crackles in the upper left  lung region. Heart has regular rate and rhythm. No palpable cervical, supraclavicular, or axillary adenopathy.  Lab Findings: Lab Results  Component Value Date   WBC 14.5 (H) 11/02/2016   HGB 8.6 (L) 11/02/2016   HCT 26.8 (L) 11/02/2016   MCV 87.0 11/02/2016   PLT 333 11/02/2016    Radiographic Findings: Ct Abdomen Pelvis Wo Contrast  Result Date: 10/31/2016 CLINICAL DATA:  76 year old female with a history of 2 week history of cough and short of breath and weakness. Diagnosis of flu. History of hematuria. EXAM: CT  ABDOMEN AND PELVIS WITHOUT CONTRAST TECHNIQUE: Multidetector CT imaging of the abdomen and pelvis was performed following the standard protocol without IV contrast. COMPARISON:  CT 09/19/2016, PET-CT 06/17/2016, CT abdomen 07/10/2013 FINDINGS: Lower chest: Bilateral ground-glass opacity of the visualized lung bases, new from the comparison studies of 2017. Trace left-sided pleural effusion. Calcifications of native coronary vasculature. Small hiatal hernia. Hepatobiliary: Unremarkable appearance of liver parenchyma. Surgical changes of cholecystectomy. Pancreas: Unremarkable appearance of pancreas. Spleen: Unremarkable spleen Adrenals/Urinary Tract: Unremarkable appearance of bilateral adrenal glands. No evidence of nephrolithiasis. No perinephric stranding. Unremarkable course the bilateral ureters. Mild dilation of the collecting system of the right greater than left renal pelvis, relatively unchanged from the PET-CT. Urinary catheter in position. Stomach/Bowel: Enteric contrast present within the small bowel and colon, extending to the descending colon and rectum. No abnormally distended small bowel or colon. Normal appendix. Minimal diverticula. No associated inflammatory changes. Vascular/Lymphatic: Calcifications of the abdominal aorta and bilateral iliac vasculature. No aneurysm. Reproductive: Unremarkable appearance of uterus and the adnexa. Other: No abdominal wall hernia identified. Musculoskeletal: No displaced fracture. Multilevel degenerative changes of the thoracolumbar spine. Grade 1 anterolisthesis of L4 on L5. Vacuum disc phenomenon throughout the lower lumbar spine. Degenerative changes bilateral hips. IMPRESSION: No acute finding of the abdomen/pelvis CT. Since comparison CTs of December 2017, there is new ground-glass opacity of the bilateral lower visualized lungs, most suggestive of atypical infection given the history of a flu diagnosis. Trace left-sided pleural effusion. No evidence of  nephrolithiasis or other finding of the urinary tract to explain the history of hematuria. Aortic atherosclerosis.  Associated iliac disease. Small hiatal hernia. Signed, Dulcy Fanny. Earleen Newport, DO [facility] Electronically Signed   By: Corrie Mckusick D.O.   On: 10/31/2016 18:34   Dg Chest 2 View  Result Date: 10/28/2016 CLINICAL DATA:  Dyspnea, cough, history of COPD and lung cancer. Ex-smoker. EXAM: CHEST  2 VIEW COMPARISON:  CT from 09/19/2016 and CXR 05/29/2016 FINDINGS: The heart is top-normal in size. There is aortic atherosclerosis without aneurysm. Diffuse interstitial prominence is noted bilaterally left greater than right with superimposed airspace opacities about the left hilum. Findings may reflect pneumonia superimposed on interstitial lung disease, the interstitial prominence possibly related to bronchitis, post treatment change or fibrosis. Slight volume loss with tenting of left inferior pulmonary ligament. Lesser degree of interstitial markings at the right costophrenic angle and right upper lobe. There appears be a trace left effusion. No pneumothorax. No acute osseous abnormality. IMPRESSION: Findings suspicious for left-sided perihilar pneumonia superimposed on interstitial lung disease. Follow-up therefore recommended to ensure clearance. Followup PA and lateral chest X-ray is recommended in 3-4 weeks following trial of antibiotic therapy to ensure resolution. Trace left effusion. Electronically Signed   By: Ashley Royalty M.D.   On: 10/28/2016 15:40   US Renal  Result Date: 10/29/2016 CLINICAL DATA:  Acute on chronic renal failure. History of squamous cell carcinoma of  the left lung. EXAM: RENAL / URINARY TRACT ULTRASOUND COMPLETE COMPARISON:  CT of the abdomen and pelvis on 07/10/2013 FINDINGS: Right Kidney: Length: 10.6 cm. The right kidney demonstrates increased cortical echogenicity with poor corticomedullary differentiation. Findings  are consistent with chronic kidney disease. Very mild fullness of the renal collecting system noted without significant hydronephrosis. No shadowing calculi identified. By ultrasound, a focal complex mass is identified in the medial aspect of the lower right kidney measuring approximately 2.3 x 2.2 x 2.2 cm. This appears predominately solid and partially cystic. Left Kidney: Length: 10.4 cm. The left kidney demonstrates increased cortical echogenicity consistent with chronic kidney disease. No hydronephrosis identified. Small simple cyst of the anterior interpolar kidney measures approximately 0.9 x 1.2 x 1.0 cm and has the appearance of a benign cyst. Bladder: Appears normal for degree of bladder distention. IMPRESSION: 1. Equal sized kidneys with increased cortical echogenicity bilaterally. Findings are consistent with known chronic kidney disease. 2. Mild fullness of the right renal collecting system without significant hydronephrosis. No evidence of left hydronephrosis. 3. 2.3 cm mass of the inferior medial right kidney which appearance predominately solid and partially cystic. Although this could represent a complex cyst, malignancy is not excluded. In the setting of acute renal failure, the patient likely cannot receive iodinated contrast or gadolinium. Recommend imaging follow-up for the right renal lesion. Either unenhanced MRI of or MRI with and without gadolinium in the future would be the best way to evaluate the right renal mass. 4. Simple cyst of the left kidney appears benign. Electronically Signed   By: Aletta Edouard M.D.   On: 10/29/2016 11:12   Dg Chest Port 1 View  Result Date: 11/01/2016 CLINICAL DATA:  Pneumonia EXAM: PORTABLE CHEST 1 VIEW COMPARISON:  10/31/2016 FINDINGS: Diffuse pneumonia has improved. Heart remains prominent. No pneumothorax. More confluent consolidation at the left base is unchanged. IMPRESSION: Improving diffuse airspace disease. Electronically Signed   By: Marybelle Killings M.D.   On: 11/01/2016 06:55   Dg Chest Port 1 View  Result Date: 10/31/2016 CLINICAL DATA:  Acute respiratory failure with hypoxia. History of lung malignancy, COPD, current smoker. EXAM: PORTABLE CHEST 1 VIEW COMPARISON:  Portable chest x-ray of October 30, 2016 FINDINGS: The lungs are well-expanded. The interstitial markings remain increased diffusely. There remains partial obscuration of the left hemidiaphragm. A small left pleural effusion is suspected. The heart is top-normal in size. The pulmonary vascularity is indistinct but not clearly engorged. IMPRESSION: Persistent diffuse interstitial infiltrates likely reflecting pneumonia. Probable small left pleural effusion. No significant change since yesterday's study. Electronically Signed   By: David  Martinique M.D.   On: 10/31/2016 07:03   Dg Chest Port 1 View  Result Date: 10/30/2016 CLINICAL DATA:  76 y/o  F; hypoxia. EXAM: PORTABLE CHEST 1 VIEW COMPARISON:  10/28/2016 chest radiograph. FINDINGS: Diffuse increase in left lung opacification and beginning opacification of the peripheries of the right lung. Stable cardiac silhouette given projection and technique. Aortic atherosclerosis with calcification. Mild degenerative changes of the spine. IMPRESSION: Marked diffuse interval increase in left lung opacification and developing opacifications at the periphery of the right lung probably representing pneumonitis, atypical for pulmonary edema. The peripheral pattern suggests cryptogenic organizing pneumonia or eosinophilic pneumonia. Consider CT of chest for further characterization. Electronically Signed   By: Kristine Garbe M.D.   On: 10/30/2016 06:23    Impression: Stage IIB (T2a, N1, M0) non-small cell lung cancer, squamous cell carcinoma, presented with left hilar mass. The patient has completed  a pre-operative dose of radiation. However, given the patient's recent hospitalization for pneumonia, congestive heart failure, CKD, and COPD  exacerbation, I do not believe the patient would be strong enough to undergo 2 additional weeks of chemoradiation at this time.  Plan: The patient is scheduled to follow up with Dr. Julien Nordmann on 01/02/17 and will have repeat scans beforehand. I will try to see the patient the same day she does Dr. Julien Nordmann. The patient and her husband are in agreement with no additional radiation therapy at this time. ____________________________________ -----------------------------------  Blair Promise, PhD, MD  This document serves as a record of services personally performed by Gery Pray, MD. It was created on his behalf by Darcus Austin, a trained medical scribe. The creation of this record is based on the scribe's personal observations and the provider's statements to them. This document has been checked and approved by the attending provider.

## 2016-11-16 ENCOUNTER — Telehealth: Payer: Self-pay | Admitting: *Deleted

## 2016-11-16 ENCOUNTER — Encounter: Payer: Self-pay | Admitting: *Deleted

## 2016-11-16 NOTE — Telephone Encounter (Signed)
CALLED PATIENT TO INFORM OF FU WITH DR. [place]lawn Park ON 01-02-17 @ 11:30 AM, SPOKE WITH PATIENT AND SHE IS AWARE OF THIS APPT.

## 2016-11-16 NOTE — Progress Notes (Signed)
Oncology Nurse Navigator Documentation  Oncology Nurse Navigator Flowsheets 11/16/2016  Navigator Location [facility]  Navigator Encounter Type Other/Renee Vance is not set up for Rad Onc for re-consultation.  I updated Rad Onc scheduler  Treatment Phase Other  Barriers/Navigation Needs Coordination of Care  Interventions Coordination of Care  Coordination of Care Appts  Acuity Level 2  Time Spent with Patient 15

## 2016-11-16 NOTE — Telephone Encounter (Signed)
Oncology Nurse Navigator Documentation  Oncology Nurse Navigator Flowsheets 11/16/2016  Navigator Location [facility]  Navigator Encounter Type Telephone/I received a message that Renee Vance needed to speak with me.  I called her this am.  She states when she was discharged from the hospital, she was referred to pulmonary in [place]. She states she does not want to go there. She states she likes Renee Vance and Renee Arrow, NP.  I reached out to Renee Vance to help her get re-established with him.   Telephone Outgoing Call  Patient Visit Type Follow-up  Treatment Phase Follow-up  Barriers/Navigation Needs Coordination of Care  Interventions Coordination of Care  Coordination of Care Appts  Acuity Level 2  Acuity Level 2 Assistance expediting appointments  Time Spent with Patient 30

## 2016-11-17 ENCOUNTER — Ambulatory Visit (INDEPENDENT_AMBULATORY_CARE_PROVIDER_SITE_OTHER): Payer: PPO | Admitting: *Deleted

## 2016-11-17 DIAGNOSIS — I480 Paroxysmal atrial fibrillation: Secondary | ICD-10-CM

## 2016-11-17 DIAGNOSIS — Z5181 Encounter for therapeutic drug level monitoring: Secondary | ICD-10-CM

## 2016-11-17 LAB — POCT INR: INR: 4.5

## 2016-11-18 DIAGNOSIS — N179 Acute kidney failure, unspecified: Secondary | ICD-10-CM | POA: Diagnosis not present

## 2016-11-21 ENCOUNTER — Telehealth: Payer: Self-pay | Admitting: *Deleted

## 2016-11-21 NOTE — Telephone Encounter (Signed)
Oncology Nurse Navigator Documentation  Oncology Nurse Navigator Flowsheets 11/21/2016  Navigator Location [facility]  Navigator Encounter Type Telephone/I received a message from Dr. Elsworth Soho.  He will be seeing Renee Vance and asked his office to set her up.  I called Renee Vance and updated her. She was thankful for the call.   Telephone Outgoing Call  Treatment Phase Follow-up  Barriers/Navigation Needs Education  Education Other  Interventions Education  Coordination of Care Other  Acuity Level 2  Time Spent with Patient 30

## 2016-11-23 ENCOUNTER — Encounter: Payer: Self-pay | Admitting: *Deleted

## 2016-11-23 NOTE — Progress Notes (Signed)
Oncology Nurse Navigator Documentation  Oncology Nurse Navigator Flowsheets 11/23/2016  Navigator Location [facility]  Navigator Encounter Type Other/I followed on an appt for Ms. Erekson with Dr. Elsworth Soho.  I reached out to Dr. Bari Mantis assistant for help scheduling.   Treatment Phase Follow-up  Barriers/Navigation Needs Coordination of Care  Interventions Coordination of Care  Coordination of Care Other  Acuity Level 2  Time Spent with Patient 15

## 2016-11-24 ENCOUNTER — Ambulatory Visit (INDEPENDENT_AMBULATORY_CARE_PROVIDER_SITE_OTHER): Payer: PPO | Admitting: *Deleted

## 2016-11-24 DIAGNOSIS — Z5181 Encounter for therapeutic drug level monitoring: Secondary | ICD-10-CM

## 2016-11-24 DIAGNOSIS — I480 Paroxysmal atrial fibrillation: Secondary | ICD-10-CM

## 2016-11-24 LAB — POCT INR: INR: 1.6

## 2016-11-25 NOTE — Addendum Note (Signed)
Encounter addended by: Jacqulyn Liner, RN on: 11/25/2016 10:23 AM<BR>    Actions taken: Charge Capture section accepted

## 2016-11-28 ENCOUNTER — Ambulatory Visit (INDEPENDENT_AMBULATORY_CARE_PROVIDER_SITE_OTHER): Payer: PPO | Admitting: Acute Care

## 2016-11-28 ENCOUNTER — Ambulatory Visit (INDEPENDENT_AMBULATORY_CARE_PROVIDER_SITE_OTHER)
Admission: RE | Admit: 2016-11-28 | Discharge: 2016-11-28 | Disposition: A | Discharge status: Home / Self Care | Payer: PPO | Source: Ambulatory Visit | Attending: Acute Care | Admitting: Acute Care

## 2016-11-28 ENCOUNTER — Encounter: Payer: Self-pay | Admitting: Acute Care

## 2016-11-28 VITALS — BP 132/78 | HR 67 | Ht 64.0 in | Wt 144.0 lb

## 2016-11-28 DIAGNOSIS — R0600 Dyspnea, unspecified: Secondary | ICD-10-CM

## 2016-11-28 DIAGNOSIS — J449 Chronic obstructive pulmonary disease, unspecified: Secondary | ICD-10-CM | POA: Diagnosis not present

## 2016-11-28 DIAGNOSIS — J438 Other emphysema: Secondary | ICD-10-CM

## 2016-11-28 DIAGNOSIS — J189 Pneumonia, unspecified organism: Secondary | ICD-10-CM | POA: Diagnosis not present

## 2016-11-28 NOTE — Patient Instructions (Addendum)
We will do a CXR  Today. We will call you with the results of your CXR. Please try [place] saline gel for your nose. Continue your nebulizer treatments as you have been doing. Boost or ensure meal supplements. Follow up with Dr. Tamala Julian. Follow up with Dr. Elsworth Soho at first available. Please contact office for sooner follow up if symptoms do not improve or worsen or seek emergency care

## 2016-11-28 NOTE — Progress Notes (Signed)
History of Present Illness Renee Vance is a 76 y.o. female with medical history significant of lung cancer (last chemotherapy was in 10/17), hypothyroidism, HLD, HTN, CAD, and COPD . She is on chronic anticoagulation. She is followed by Dr. Elsworth Soho.   11/28/2016 Hospital Follow Up: Pt. presents for hospital Follow up. She was admitted to the hospital 10/28/2016-11/04/2016.She was diagnosed with influenza which progressed to pneumonia ( HCAP). She was treated with Tamiflu, vancomycin,cefepime, and she was discharged on Doxycycline. Additionally as an inpatient she was treated with IV steroids and scheduled BD's. She presents today still wearing her oxygen. She states she is still short of breath with ambulation, especially when climbing stairs.She has developed another cough, with white to light yellow secretions. She denies chest pain, fever, orthopnea, of hemoptysis. Last chemo was 07/2016. Per last CT she has had good response to therapy.Dr. Earlie Server is waiting for her to recover from hospitalization, and plan is for further chemotherapy. Per her husband, she is very active, walking up and down stairs and house cleaning. She is compliant with her Pulmocort twice daily.She is using her Pulmicort twice daily.She is currently not using her Spiriva.She states she is using humidification for her oxygen, but still has some blood clots from her nose. She is wearing her oxygen at 2 L. She states her renal doc took her off her Lasix for elevated creatinine. Her last creatinine was checked 1 week ago. We do not have access to those results. She states she is not smoking, but her husband continues to smoke, as there is a strong smell of cigarette smoke in the exam room today.  Tests 11/28/2016 CXR>>   CT Chest W/ contrast 09/19/2016 Significant regression of left infrahilar/lingular mass suggesting an excellent response to therapy No mediastinal or hilar lymphadenopathy. Stable peripheral interstitial  lung disease but no findings suspicious for pulmonary metastatic disease.   Past medical hx Past Medical History:  Diagnosis Date  . 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 Bradycardia   . CAD (coronary artery disease)   . COPD (chronic obstructive pulmonary disease) ([place])   . Coronary atherosclerosis of native coronary artery 2011  . Encounter for antineoplastic chemotherapy 06/23/2016  . Fibrocystic breast   . History of radiation therapy 07/05/16 - 08/09/16    Left Lung: 45 Gy in 25 fractions  . HTN (hypertension)   . Hyperlipidemia   . Hypothyroidism   . Obesity   . Pain in joint   . Stage II squamous cell carcinoma of left lung ([place]) 06/23/2016     Past surgical hx, Family hx, Social hx all reviewed.  Current Outpatient Prescriptions on File Prior to Visit  Medication Sig  . amLODipine (NORVASC) 5 MG tablet Take 5 mg by mouth daily.  Marland Kitchen aspirin 81 MG tablet Take 81 mg by mouth daily.  Marland Kitchen atorvastatin (LIPITOR) 80 MG tablet Take 80 mg by mouth daily.  . budesonide (PULMICORT) 0.5 MG/2ML nebulizer solution Take 2 mLs (0.5 mg total) by nebulization 2 (two) times daily.  Marland Kitchen dextromethorphan (DELSYM) 30 MG/5ML liquid Take 60 mg by mouth 2 (two) times daily.   Marland Kitchen ipratropium-albuterol (DUONEB) 0.5-2.5 (3) MG/3ML SOLN Take 3 mLs by nebulization every 6 (six) hours as needed.  Marland Kitchen levothyroxine (SYNTHROID, LEVOTHROID) 125 MCG tablet Take 125 mcg by mouth daily before breakfast.   . metoprolol succinate (TOPROL-XL) 50 MG 24 hr tablet Take 1 tablet (50 mg total) by mouth 2 (two) times daily.  . Vitamin D, Cholecalciferol, 1000 UNITS  CAPS Take 1,000 Units by mouth daily.   Marland Kitchen warfarin (COUMADIN) 5 MG tablet Take 1/2 tablet daily except 1 tablet on Sundays and Thursdays (Patient taking differently: Take 2.'5mg'$  Sunday, Tuesday, Thursday. Take '5mg'$  by mouth on Monday, Wednesday, Friday, Saturday)  . furosemide (LASIX) 40 MG tablet Take 1 tablet (40 mg total) by mouth daily.  (Patient not taking: Reported on 11/28/2016)  . PROAIR RESPICLICK 756 (90 Base) MCG/ACT AEPB Take 1-2 puffs by mouth daily as needed for wheezing or shortness of breath.   No current facility-administered medications on file prior to visit.      Allergies  Allergen Reactions  . Ampicillin Nausea And Vomiting    Has patient had a PCN reaction causing immediate rash, facial/tongue/throat swelling, SOB or lightheadedness with hypotension: no Has patient had a PCN reaction causing severe rash involving mucus membranes or skin necrosis: no Has patient had a PCN reaction that required hospitalization: no Has patient had a PCN reaction occurring within the last 10 years: no If all of the above answers are "NO", then may proceed with Cephalosporin use  . Codeine Nausea And Vomiting    Review Of Systems:  Constitutional:   No  weight loss, night sweats,  Fevers, chills, fatigue, or  lassitude.  HEENT:   No headaches,  Difficulty swallowing,  Tooth/dental problems, or  Sore throat,                No sneezing, itching, ear ache, nasal congestion, post nasal drip,   CV:  No chest pain,  Orthopnea, PND, swelling in lower extremities, anasarca, dizziness, palpitations, syncope.   GI  No heartburn, indigestion, abdominal pain, nausea, vomiting, diarrhea, change in bowel habits, loss of appetite, bloody stools.   Resp: + shortness of breath with exertion less at rest.  + excess mucus, + productive cough,  No non-productive cough,  No coughing up of blood.  No change in color of mucus.  No wheezing.  No chest wall deformity  Skin: no rash or lesions.  GU: no dysuria, change in color of urine, no urgency or frequency.  No flank pain, no hematuria   MS:  No joint pain or swelling.  No decreased range of motion.  No back pain.  Psych:  No change in mood or affect. No depression or anxiety.  No memory loss.   Vital Signs BP 132/78 (BP Location: Left Arm, Cuff Size: Normal)   Pulse 67   Ht '5\' 4"'$   (1.626 m)   Wt 144 lb (65.3 kg)   SpO2 96%   BMI 24.72 kg/m    Physical Exam:  General- No distress,  A&Ox3, pleasant ENT: No sinus tenderness, TM clear, pale nasal mucosa, no oral exudate,no post nasal drip, no LAN Cardiac: S1, S2, regular rate and rhythm, no murmur Chest: No wheeze/ rales/ dullness; no accessory muscle use, no nasal flaring, no sternal retractions, diminished per bases Abd.: Soft Non-tender, bery thin Ext: No clubbing cyanosis, edema Neuro:  Deconditioned Skin: no rashes, warm and dry, some bruising noted Psych: normal mood and behavior   Assessment/Plan  Dyspnea Dyspnea after having lasix stopped for elevated creatinine. No edema noted Oxygen saturations 96% on 2 L oxygen in office today Plan: Follow Up CXR today  Follow up with Dr. Elsworth Soho at first available appointment Follow up with cardiology in near future   Protein Calorie Malnutrition: Moderate Weight loss since chemo and with hospitalization Plan: Boost or Ensure meal supplementation  Magdalen Spatz, NP 11/28/2016  2:03 PM

## 2016-11-28 NOTE — Assessment & Plan Note (Addendum)
Dyspnea after having lasix stopped for elevated creatinine. No edema noted Oxygen saturations 96% on 2 L oxygen in office today Plan: Follow Up CXR today  Follow up with Dr. Elsworth Soho at first available appointment Follow up with cardiology in near future

## 2016-11-30 ENCOUNTER — Encounter: Payer: Self-pay | Admitting: Internal Medicine

## 2016-11-30 ENCOUNTER — Other Ambulatory Visit: Payer: Self-pay | Admitting: Internal Medicine

## 2016-11-30 DIAGNOSIS — R310 Gross hematuria: Secondary | ICD-10-CM

## 2016-11-30 DIAGNOSIS — N2889 Other specified disorders of kidney and ureter: Secondary | ICD-10-CM

## 2016-11-30 DIAGNOSIS — N1330 Unspecified hydronephrosis: Secondary | ICD-10-CM | POA: Diagnosis not present

## 2016-11-30 HISTORY — DX: Other specified disorders of kidney and ureter: N28.89

## 2016-12-05 DIAGNOSIS — C3492 Malignant neoplasm of unspecified part of left bronchus or lung: Secondary | ICD-10-CM | POA: Diagnosis not present

## 2016-12-05 DIAGNOSIS — J449 Chronic obstructive pulmonary disease, unspecified: Secondary | ICD-10-CM | POA: Diagnosis not present

## 2016-12-05 NOTE — Progress Notes (Signed)
CARDIOLOGY OFFICE NOTE  Date:  12/06/2016    Renee Vance Date of Birth: [birth year] Medical Record #[id number]  PCP:  [name]  Cardiologist:  Jennings Books  Chief Complaint  Patient presents with  . Atrial Fibrillation  . Coronary Artery Disease    Post hospital visit - seen for Dr. Tamala Vance    History of Present Illness: Renee Vance is a 76 y.o. female who presents today for a work in visit. Seen for Dr. Tamala Vance.   She has a history of paroxysmal atrial fibrillation, coronary artery disease with drug-eluting stent in the distal RCA and intermediate stenosis in the LAD had cath in 2011, COPD, hyperlipidemia, obesity, and relatively recent diagnosis of squamous cell carcinoma of the left upper lobe. She is status post radiochemotherapy with good response. She was being considered for left upper lobectomy - which at last visit - she had decided to not have. FEV1 is 70%.  Seen last month as a pre op clearance - no cardiac complaints. Does have DOE with minimal activity. She decided to NOT proceed. Dr. Tamala Vance had wanted to pursue Myoview if she was going to proceed.   Admitted back in January about 10 days after her last visit here - had the flu - complicated by pneumonia. Febrile. Hypoxic. Felt to have acute diastolic heart failure and was diuresed as well. Echo with EF 60 to 65% with grade 2 DD noted. Had gross hematuria and had to be transfused and have her coumadin reversed. Right kidney ultrasound with a complex cyst versus malignacy - to have follow up MRI.    Comes in today. Here alone. Oxygen in place. Says she was told to come here because of her heart issues while hospitalized last month.   Past Medical History:  Diagnosis Date  . 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 Bradycardia   . CAD (coronary artery disease)   . COPD (chronic obstructive pulmonary disease) ([place])   . Coronary atherosclerosis of native coronary artery 2011    . Encounter for antineoplastic chemotherapy 06/23/2016  . Fibrocystic breast   . History of radiation therapy 07/05/16 - 08/09/16    Left Lung: 45 Gy in 25 fractions  . HTN (hypertension)   . Hyperlipidemia   . Hypothyroidism   . Obesity   . Pain in joint   . Right renal mass 11/30/2016  . 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 CHOLECYSTECTOMY    . CORONARY ANGIOPLASTY WITH STENT PLACEMENT  2011   Lmain 30-40%, LAD 65-75% (FFR 0.88), CFX 55-60%, RCA 95%>0 w/ 2.5 x 12 mm monorail stent  . TUBAL LIGATION    . VIDEO BRONCHOSCOPY Bilateral 06/07/2016   Procedure: VIDEO BRONCHOSCOPY WITH FLUORO;  Surgeon: [name], MD;  Location: [facility];  Service: Cardiopulmonary;  Laterality: Bilateral;     Medications: Current Outpatient Prescriptions  Medication Sig Dispense Refill  . OXYGEN Inhale 2 L into the lungs continuous.    Marland Kitchen amLODipine (NORVASC) 5 MG tablet Take 5 mg by mouth daily.    Marland Kitchen aspirin 81 MG tablet Take 81 mg by mouth daily.    Marland Kitchen atorvastatin (LIPITOR) 80 MG tablet Take 80 mg by mouth daily.    . budesonide (PULMICORT) 0.5 MG/2ML nebulizer solution Take 2 mLs (0.5 mg total) by nebulization 2 (two) times daily. 2 mL 60  . dextromethorphan (DELSYM) 30 MG/5ML liquid Take  60 mg by mouth 2 (two) times daily.     Marland Kitchen ipratropium-albuterol (DUONEB) 0.5-2.5 (3) MG/3ML SOLN Take 3 mLs by nebulization every 6 (six) hours as needed. 360 mL 0  . levothyroxine (SYNTHROID, LEVOTHROID) 125 MCG tablet Take 125 mcg by mouth daily before breakfast.     . metoprolol succinate (TOPROL-XL) 50 MG 24 hr tablet Take 1 tablet (50 mg total) by mouth 2 (two) times daily. 180 tablet 1  . Vitamin D, Cholecalciferol, 1000 UNITS CAPS Take 1,000 Units by mouth daily.     Marland Kitchen warfarin (COUMADIN) 5 MG tablet Take 1/2 tablet daily except 1 tablet on Sundays and Thursdays (Patient taking differently: Take 2.'5mg'$  Sunday, Tuesday,  Thursday. Take '5mg'$  by mouth on Monday, Wednesday, Friday, Saturday) 90 tablet 3   No current facility-administered medications for this visit.     Allergies: Allergies  Allergen Reactions  . Ampicillin Nausea And Vomiting    Has patient had a PCN reaction causing immediate rash, facial/tongue/throat swelling, SOB or lightheadedness with hypotension: no Has patient had a PCN reaction causing severe rash involving mucus membranes or skin necrosis: no Has patient had a PCN reaction that required hospitalization: no Has patient had a PCN reaction occurring within the last 10 years: no If all of the above answers are "NO", then may proceed with Cephalosporin use  . Codeine Nausea And Vomiting    Social History: The patient  reports that she quit smoking about 2 years ago. She has a 30.00 pack-year smoking history. She has never used smokeless tobacco. She reports that she does not drink alcohol or use drugs.   Family History: The patient's family history includes Heart disease in her mother.   Review of Systems: Please see the history of present illness.   Otherwise, the review of systems is positive for none.   All other systems are reviewed and negative.   Physical Exam: VS:  BP 124/64   Pulse 64   Ht '5\' 4"'$  (1.626 m)   Wt 145 lb 6.4 oz (66 kg)   BMI 24.96 kg/m  .  BMI Body mass index is 24.96 kg/m.  Wt Readings from Last 3 Encounters:  12/06/16 145 lb 6.4 oz (66 kg)  11/28/16 144 lb (65.3 kg)  11/15/16 143 lb 3.2 oz (65 kg)    General: Pleasant. Well developed, well nourished and in no acute distress.   HEENT: Normal.  Neck: Supple, no JVD, carotid bruits, or masses noted.  Cardiac: Regular rate and rhythm. No murmurs, rubs, or gallops. No edema.  Respiratory:  Lungs are clear to auscultation bilaterally with normal work of breathing.  GI: Soft and nontender.  MS: No deformity or atrophy. Gait and ROM intact.  Skin: Warm and dry. Color is normal.  Neuro:  Strength and  sensation are intact and no gross focal deficits noted.  Psych: Alert, appropriate and with normal affect.   LABORATORY DATA:  EKG:  EKG is not ordered today.  Lab Results  Component Value Date   WBC 14.5 (H) 11/02/2016   HGB 8.6 (L) 11/02/2016   HCT 26.8 (L) 11/02/2016   PLT 333 11/02/2016   GLUCOSE 105 (H) 11/03/2016   ALT 69 (H) 11/01/2016   AST 97 (H) 11/01/2016   NA 136 11/03/2016   K 4.8 11/03/2016   CL 99 (L) 11/03/2016   CREATININE 2.20 (H) 11/03/2016   BUN 71 (H) 11/03/2016   CO2 29 11/03/2016   TSH 0.090 (L) 10/29/2016   INR 1.6 11/24/2016  HGBA1C 5.7 (H) 10/30/2016     BNP (last 3 results)  Recent Labs  10/30/16 0529  BNP 963.1*    ProBNP (last 3 results) No results for input(s): PROBNP in the last 8760 hours.   Other Studies Reviewed Today:  Echo Study Conclusions from 10/2016  - Left ventricle: The cavity size was normal. Wall thickness was   increased in a pattern of mild LVH. Systolic function was normal.   The estimated ejection fraction was in the range of 60% to 65%.   Wall motion was normal; there were no regional wall motion   abnormalities. Features are consistent with a pseudonormal left   ventricular filling pattern, with concomitant abnormal relaxation   and increased filling pressure (grade 2 diastolic dysfunction). - Aortic valve: Trileaflet; mildly calcified leaflets. Mean   gradient (S): 12 mm Hg. Peak gradient (S): 24 mm Hg. VTI ratio of   LVOT to aortic valve: 0.77. - Mitral valve: Calcified annulus. There was trivial regurgitation. - Left atrium: The atrium was at the upper limits of normal in   size. - Right atrium: Central venous pressure (est): 3 mm Hg. - Tricuspid valve: There was trivial regurgitation. - Pulmonary arteries: PA peak pressure: 28 mm Hg (S). - Pericardium, extracardiac: A prominent pericardial fat pad was   present.  Impressions:  - Moderate LVH with LVEF 60-65% and grade 2 diastolic dysfunction.    Upper normal atrial chamber size. Calcified mitral annulus with   trivial mitral regurgitation. Moderately sclerotic aortic valve.   Trivial tricuspid regurgitation with PASP 28 mmHg.  Assessment/Plan:  1. Acute on chronic diastolic HF - in the setting of flu/pneumonia - she has improved clinically which would be expected since she has recovered from the flu/pneumonia -- explained that we manage this with salt restriction and good BP control. I actually think this is actually the least of her issues at this time. Would use just Lasix 20 mg prn weight gain, restrict salt and weigh daily. She seems to have a very good understanding.   2. HTN - her BP is great today. I have left her on her current regimen.   3. CAD - no active chest pain. Would continue with her current medical management. No further testing felt to be warranted at this time.   4. Emphysema/chronic dyspnea - on oxygen therapy.   5. Lung cancer - she has declined lobectomy.   6. CKD - followed by [facility] nephrology. Labs from earlier this month noted.  7. Anemia - not clear to me the etiology but last lab showed improvement  8. Chronic coumadin - monitored by the [facility] office.   Current medicines are reviewed with the patient today.  The patient does not have concerns regarding medicines other than what has been noted above.  The following changes have been made:  See above.  Labs/ tests ordered today include:   No orders of the defined types were placed in this encounter.    Disposition:   FU with me in 4 months and see Dr. Tamala Vance back next January as planned.   Patient is agreeable to this plan and will call if any problems develop in the interim.   SignedTruitt Merle, NP  12/06/2016 3:58 PM  [facility] [address] [place], [place]  [address] Phone: [phone] Fax: [phone]

## 2016-12-06 ENCOUNTER — Encounter: Payer: Self-pay | Admitting: Nurse Practitioner

## 2016-12-06 ENCOUNTER — Ambulatory Visit (INDEPENDENT_AMBULATORY_CARE_PROVIDER_SITE_OTHER): Payer: PPO | Admitting: Nurse Practitioner

## 2016-12-06 VITALS — BP 124/64 | HR 64 | Ht 64.0 in | Wt 145.4 lb

## 2016-12-06 DIAGNOSIS — I480 Paroxysmal atrial fibrillation: Secondary | ICD-10-CM | POA: Diagnosis not present

## 2016-12-06 DIAGNOSIS — I10 Essential (primary) hypertension: Secondary | ICD-10-CM

## 2016-12-06 DIAGNOSIS — Z5181 Encounter for therapeutic drug level monitoring: Secondary | ICD-10-CM

## 2016-12-06 DIAGNOSIS — I5032 Chronic diastolic (congestive) heart failure: Secondary | ICD-10-CM

## 2016-12-06 MED ORDER — FUROSEMIDE 20 MG PO TABS: 20.0000 mg | ORAL_TABLET | Freq: Every day | ORAL | 3 refills | Status: DC | PRN

## 2016-12-06 NOTE — Patient Instructions (Addendum)
We will be checking the following labs today - NONE   Medication Instructions:    Continue with your current medicines. BUT  Lasix 20 mg to use only if needed - this would be for weight gain of 2 or more pounds overnight, obvious swelling, etc.  - you can pick up the RX from the pharmacy that is already there    Testing/Procedures To Be Arranged:  N/A  Follow-Up:   See me in 4 months    Other Special Instructions:   N/A    If you need a refill on your cardiac medications before your next appointment, please call your pharmacy.   Call the [place] office at [phone] if you have any questions, problems or concerns.

## 2016-12-08 ENCOUNTER — Ambulatory Visit (INDEPENDENT_AMBULATORY_CARE_PROVIDER_SITE_OTHER): Payer: PPO | Admitting: *Deleted

## 2016-12-08 DIAGNOSIS — I480 Paroxysmal atrial fibrillation: Secondary | ICD-10-CM

## 2016-12-08 DIAGNOSIS — N179 Acute kidney failure, unspecified: Secondary | ICD-10-CM | POA: Diagnosis not present

## 2016-12-08 DIAGNOSIS — Z5181 Encounter for therapeutic drug level monitoring: Secondary | ICD-10-CM

## 2016-12-08 LAB — POCT INR: INR: 1.3

## 2016-12-12 DIAGNOSIS — N183 Chronic kidney disease, stage 3 (moderate): Secondary | ICD-10-CM | POA: Diagnosis not present

## 2016-12-12 DIAGNOSIS — E559 Vitamin D deficiency, unspecified: Secondary | ICD-10-CM | POA: Diagnosis not present

## 2016-12-12 DIAGNOSIS — M9080 Osteopathy in diseases classified elsewhere, unspecified site: Secondary | ICD-10-CM | POA: Diagnosis not present

## 2016-12-12 DIAGNOSIS — E889 Metabolic disorder, unspecified: Secondary | ICD-10-CM | POA: Diagnosis not present

## 2016-12-12 DIAGNOSIS — N281 Cyst of kidney, acquired: Secondary | ICD-10-CM | POA: Diagnosis not present

## 2016-12-12 DIAGNOSIS — I129 Hypertensive chronic kidney disease with stage 1 through stage 4 chronic kidney disease, or unspecified chronic kidney disease: Secondary | ICD-10-CM | POA: Diagnosis not present

## 2016-12-12 DIAGNOSIS — I480 Paroxysmal atrial fibrillation: Secondary | ICD-10-CM | POA: Diagnosis not present

## 2016-12-13 ENCOUNTER — Telehealth: Payer: Self-pay | Admitting: *Deleted

## 2016-12-13 NOTE — Telephone Encounter (Signed)
Starting back on Lasix '20mg'$  QOD.  Told pt this low dose should not interfere with INR.  Has INR appt on 12/20/16.

## 2016-12-13 NOTE — Telephone Encounter (Signed)
Will be going back on lasix and wants to make sure does not interfere with coumadin

## 2016-12-15 DIAGNOSIS — J449 Chronic obstructive pulmonary disease, unspecified: Secondary | ICD-10-CM | POA: Diagnosis not present

## 2016-12-15 DIAGNOSIS — Z0000 Encounter for general adult medical examination without abnormal findings: Secondary | ICD-10-CM | POA: Diagnosis not present

## 2016-12-20 ENCOUNTER — Ambulatory Visit (INDEPENDENT_AMBULATORY_CARE_PROVIDER_SITE_OTHER): Payer: PPO | Admitting: *Deleted

## 2016-12-20 DIAGNOSIS — I480 Paroxysmal atrial fibrillation: Secondary | ICD-10-CM | POA: Diagnosis not present

## 2016-12-20 DIAGNOSIS — Z5181 Encounter for therapeutic drug level monitoring: Secondary | ICD-10-CM | POA: Diagnosis not present

## 2016-12-20 LAB — POCT INR: INR: 2.2

## 2016-12-26 ENCOUNTER — Encounter (HOSPITAL_COMMUNITY): Payer: Self-pay

## 2016-12-26 ENCOUNTER — Other Ambulatory Visit (HOSPITAL_BASED_OUTPATIENT_CLINIC_OR_DEPARTMENT_OTHER): Payer: PPO

## 2016-12-26 ENCOUNTER — Other Ambulatory Visit: Payer: Self-pay | Admitting: Internal Medicine

## 2016-12-26 ENCOUNTER — Ambulatory Visit (HOSPITAL_COMMUNITY)
Admission: RE | Admit: 2016-12-26 | Discharge: 2016-12-26 | Disposition: A | Discharge status: Home / Self Care | Payer: PPO | Source: Ambulatory Visit | Attending: Internal Medicine | Admitting: Internal Medicine

## 2016-12-26 DIAGNOSIS — J479 Bronchiectasis, uncomplicated: Secondary | ICD-10-CM | POA: Diagnosis not present

## 2016-12-26 DIAGNOSIS — Z5111 Encounter for antineoplastic chemotherapy: Secondary | ICD-10-CM

## 2016-12-26 DIAGNOSIS — T451X5A Adverse effect of antineoplastic and immunosuppressive drugs, initial encounter: Secondary | ICD-10-CM

## 2016-12-26 DIAGNOSIS — J90 Pleural effusion, not elsewhere classified: Secondary | ICD-10-CM | POA: Insufficient documentation

## 2016-12-26 DIAGNOSIS — J449 Chronic obstructive pulmonary disease, unspecified: Secondary | ICD-10-CM | POA: Diagnosis not present

## 2016-12-26 DIAGNOSIS — C3492 Malignant neoplasm of unspecified part of left bronchus or lung: Secondary | ICD-10-CM | POA: Insufficient documentation

## 2016-12-26 DIAGNOSIS — I2510 Atherosclerotic heart disease of native coronary artery without angina pectoris: Secondary | ICD-10-CM | POA: Diagnosis not present

## 2016-12-26 DIAGNOSIS — D6481 Anemia due to antineoplastic chemotherapy: Secondary | ICD-10-CM | POA: Diagnosis not present

## 2016-12-26 DIAGNOSIS — I700 Atherosclerosis of aorta: Secondary | ICD-10-CM | POA: Insufficient documentation

## 2016-12-26 LAB — CBC WITH DIFFERENTIAL/PLATELET
BASO%: 0.6 % (ref 0.0–2.0)
Basophils Absolute: 0 10*3/uL (ref 0.0–0.1)
EOS%: 3.7 % (ref 0.0–7.0)
Eosinophils Absolute: 0.3 10*3/uL (ref 0.0–0.5)
HCT: 26.8 % — ABNORMAL LOW (ref 34.8–46.6)
HGB: 9 g/dL — ABNORMAL LOW (ref 11.6–15.9)
LYMPH%: 6.8 % — AB (ref 14.0–49.7)
MCH: 30.4 pg (ref 25.1–34.0)
MCHC: 33.7 g/dL (ref 31.5–36.0)
MCV: 90 fL (ref 79.5–101.0)
MONO#: 0.6 10*3/uL (ref 0.1–0.9)
MONO%: 8.3 % (ref 0.0–14.0)
NEUT#: 5.9 10*3/uL (ref 1.5–6.5)
NEUT%: 80.6 % — AB (ref 38.4–76.8)
Platelets: 243 10*3/uL (ref 145–400)
RBC: 2.98 10*6/uL — AB (ref 3.70–5.45)
RDW: 18.2 % — ABNORMAL HIGH (ref 11.2–14.5)
WBC: 7.3 10*3/uL (ref 3.9–10.3)
lymph#: 0.5 10*3/uL — ABNORMAL LOW (ref 0.9–3.3)

## 2016-12-26 LAB — COMPREHENSIVE METABOLIC PANEL
ALK PHOS: 81 U/L (ref 40–150)
ALT: 17 U/L (ref 0–55)
AST: 22 U/L (ref 5–34)
Albumin: 3.2 g/dL — ABNORMAL LOW (ref 3.5–5.0)
Anion Gap: 10 mEq/L (ref 3–11)
BILIRUBIN TOTAL: 0.48 mg/dL (ref 0.20–1.20)
BUN: 28.2 mg/dL — ABNORMAL HIGH (ref 7.0–26.0)
CO2: 27 mEq/L (ref 22–29)
Calcium: 9.7 mg/dL (ref 8.4–10.4)
Chloride: 104 mEq/L (ref 98–109)
Creatinine: 1.7 mg/dL — ABNORMAL HIGH (ref 0.6–1.1)
EGFR: 30 mL/min/{1.73_m2} — ABNORMAL LOW (ref >90)
GLUCOSE: 90 mg/dL (ref 70–140)
Potassium: 4.4 mEq/L (ref 3.5–5.1)
Sodium: 141 mEq/L (ref 136–145)
Total Protein: 7.1 g/dL (ref 6.4–8.3)

## 2016-12-29 ENCOUNTER — Ambulatory Visit: Payer: PPO | Admitting: Pulmonary Disease

## 2017-01-02 ENCOUNTER — Telehealth: Payer: Self-pay | Admitting: Internal Medicine

## 2017-01-02 ENCOUNTER — Ambulatory Visit (HOSPITAL_COMMUNITY)
Admission: RE | Admit: 2017-01-02 | Discharge: 2017-01-02 | Disposition: A | Discharge status: Home / Self Care | Payer: PPO | Source: Ambulatory Visit | Attending: Internal Medicine | Admitting: Internal Medicine

## 2017-01-02 ENCOUNTER — Encounter: Payer: Self-pay | Admitting: Internal Medicine

## 2017-01-02 ENCOUNTER — Ambulatory Visit
Admission: RE | Admit: 2017-01-02 | Discharge: 2017-01-02 | Disposition: A | Discharge status: Home / Self Care | Payer: PPO | Source: Ambulatory Visit | Attending: Radiation Oncology | Admitting: Radiation Oncology

## 2017-01-02 ENCOUNTER — Encounter: Payer: Self-pay | Admitting: Radiation Oncology

## 2017-01-02 ENCOUNTER — Ambulatory Visit (HOSPITAL_BASED_OUTPATIENT_CLINIC_OR_DEPARTMENT_OTHER): Payer: PPO | Admitting: Internal Medicine

## 2017-01-02 VITALS — BP 118/45 | HR 71 | Temp 97.8°F | Resp 18 | Ht 64.0 in | Wt 148.0 lb

## 2017-01-02 DIAGNOSIS — R918 Other nonspecific abnormal finding of lung field: Secondary | ICD-10-CM | POA: Diagnosis not present

## 2017-01-02 DIAGNOSIS — N2889 Other specified disorders of kidney and ureter: Secondary | ICD-10-CM | POA: Diagnosis not present

## 2017-01-02 DIAGNOSIS — R040 Epistaxis: Secondary | ICD-10-CM | POA: Insufficient documentation

## 2017-01-02 DIAGNOSIS — I708 Atherosclerosis of other arteries: Secondary | ICD-10-CM | POA: Diagnosis not present

## 2017-01-02 DIAGNOSIS — M47896 Other spondylosis, lumbar region: Secondary | ICD-10-CM | POA: Insufficient documentation

## 2017-01-02 DIAGNOSIS — Z7901 Long term (current) use of anticoagulants: Secondary | ICD-10-CM | POA: Insufficient documentation

## 2017-01-02 DIAGNOSIS — J90 Pleural effusion, not elsewhere classified: Secondary | ICD-10-CM | POA: Insufficient documentation

## 2017-01-02 DIAGNOSIS — C3492 Malignant neoplasm of unspecified part of left bronchus or lung: Secondary | ICD-10-CM | POA: Diagnosis not present

## 2017-01-02 DIAGNOSIS — Z7982 Long term (current) use of aspirin: Secondary | ICD-10-CM | POA: Insufficient documentation

## 2017-01-02 DIAGNOSIS — Z885 Allergy status to narcotic agent status: Secondary | ICD-10-CM | POA: Diagnosis not present

## 2017-01-02 DIAGNOSIS — J449 Chronic obstructive pulmonary disease, unspecified: Secondary | ICD-10-CM

## 2017-01-02 DIAGNOSIS — M11252 Other chondrocalcinosis, left hip: Secondary | ICD-10-CM | POA: Insufficient documentation

## 2017-01-02 DIAGNOSIS — M11251 Other chondrocalcinosis, right hip: Secondary | ICD-10-CM | POA: Insufficient documentation

## 2017-01-02 DIAGNOSIS — I2510 Atherosclerotic heart disease of native coronary artery without angina pectoris: Secondary | ICD-10-CM | POA: Diagnosis not present

## 2017-01-02 DIAGNOSIS — Z79899 Other long term (current) drug therapy: Secondary | ICD-10-CM | POA: Diagnosis not present

## 2017-01-02 DIAGNOSIS — R310 Gross hematuria: Secondary | ICD-10-CM | POA: Insufficient documentation

## 2017-01-02 DIAGNOSIS — C3412 Malignant neoplasm of upper lobe, left bronchus or lung: Secondary | ICD-10-CM | POA: Diagnosis not present

## 2017-01-02 DIAGNOSIS — M4316 Spondylolisthesis, lumbar region: Secondary | ICD-10-CM | POA: Diagnosis not present

## 2017-01-02 DIAGNOSIS — Z880 Allergy status to penicillin: Secondary | ICD-10-CM | POA: Diagnosis not present

## 2017-01-02 DIAGNOSIS — Z08 Encounter for follow-up examination after completed treatment for malignant neoplasm: Secondary | ICD-10-CM | POA: Diagnosis not present

## 2017-01-02 DIAGNOSIS — Z9981 Dependence on supplemental oxygen: Secondary | ICD-10-CM | POA: Diagnosis not present

## 2017-01-02 DIAGNOSIS — I517 Cardiomegaly: Secondary | ICD-10-CM | POA: Diagnosis not present

## 2017-01-02 DIAGNOSIS — M5136 Other intervertebral disc degeneration, lumbar region: Secondary | ICD-10-CM | POA: Diagnosis not present

## 2017-01-02 DIAGNOSIS — I700 Atherosclerosis of aorta: Secondary | ICD-10-CM | POA: Diagnosis not present

## 2017-01-02 DIAGNOSIS — Z923 Personal history of irradiation: Secondary | ICD-10-CM | POA: Diagnosis not present

## 2017-01-02 NOTE — Progress Notes (Signed)
Radiation Oncology         [phone] ________________________________  Name: Renee Vance MRN: [id number]  Date: 01/02/2017  DOB: 10/19/1940  Follow-Up Visit Note  CC: HEPLER,MARK, PA-C  Hepler, Mark, PA-C    ICD-9-CM ICD-10-CM   1. 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 She was seen by Dr. Julien Nordmann earlier this morning. Per his report, her most recent chest CT showed persistent consolidation and fibrosis in the left lower lung area and left hilar. There was also a questionable nodule in the left adrenal gland suspicious for metastatic disease. He recommends a PET scan for further evaluation.  The patient denies pain at this time. She reports shortness of breath with activity. She reports an occasional productive cough with tan sputum. She notes she experiences epistaxis when she blows her nose. She denies difficulty swallowing or sore throat. She reports fatigue in the evening.  ALLERGIES:  is allergic to ampicillin and codeine.  Meds: Current Outpatient Prescriptions  Medication Sig Dispense Refill  . amLODipine (NORVASC) 5 MG tablet Take 5 mg by mouth daily.    Marland Kitchen aspirin 81 MG tablet Take 81 mg by mouth daily.    Marland Kitchen atorvastatin (LIPITOR) 80 MG tablet Take 80 mg by mouth daily.    . budesonide (PULMICORT) 0.5 MG/2ML nebulizer solution Take 2 mLs (0.5 mg total) by nebulization 2 (two) times daily. 2 mL 60  . dextromethorphan (DELSYM) 30 MG/5ML liquid Take 60 mg by mouth 2 (two) times daily.     . furosemide (LASIX) 20 MG tablet Take 20 mg by mouth every other day.    . ipratropium-albuterol (DUONEB) 0.5-2.5 (3) MG/3ML SOLN Take 3 mLs by nebulization every 6 (six)  hours as needed. 360 mL 0  . levothyroxine (SYNTHROID, LEVOTHROID) 125 MCG tablet Take 125 mcg by mouth daily before breakfast.     . metoprolol succinate (TOPROL-XL) 50 MG 24 hr tablet Take 1 tablet (50 mg total) by mouth 2 (two) times daily. 180 tablet 1  . OXYGEN Inhale 2 L into the lungs continuous.    . Vitamin D, Cholecalciferol, 1000 UNITS CAPS Take 1,000 Units by mouth daily.     Marland Kitchen warfarin (COUMADIN) 5 MG tablet Take 1/2 tablet daily except 1 tablet on Sundays and Thursdays (Patient taking differently: Take 2.'5mg'$  Sunday, Tuesday, Thursday. Take '5mg'$  by mouth on Monday, Wednesday, Friday, Saturday) 90 tablet 3   No current facility-administered medications for this encounter.     Physical Findings: The patient is in no acute distress. Patient is alert and oriented.  vitals were not taken for this visit.. Vitals with BMI 01/02/2017  Height '5\' 4"'$   Weight 148 lbs  BMI 12.4  Systolic 580  Diastolic 45  Pulse 71  Respirations 18  2L of oxygen via nasal cannula. Bilateral lungs were clear to ausculation. Heart has regular rate and rhythm. No palpable cervical, supraclavicular, or axillary adenopathy.  Lab Findings: Lab Results  Component Value Date   WBC 7.3 12/26/2016   HGB 9.0 (L) 12/26/2016   HCT 26.8 (L) 12/26/2016   MCV 90.0 12/26/2016   PLT 243 12/26/2016    Radiographic Findings: Ct Abdomen Pelvis Wo Contrast  Result Date: 01/02/2017 CLINICAL DATA:  Gross hematuria. Renal mass. Squamous cell carcinoma of the left upper lobe. EXAM: CT ABDOMEN AND PELVIS WITHOUT CONTRAST TECHNIQUE: Multidetector CT imaging of the abdomen and pelvis was performed following the standard protocol without IV contrast. COMPARISON:  Multiple exams, including 10/31/2016 FINDINGS: Despite efforts by the technologist and patient, motion artifact is present on today's exam and could not be eliminated. This reduces exam sensitivity and specificity. Lower chest: Moderate left pleural effusion with  passive atelectasis. Peripheral interstitial accentuation at the lung bases. Coronary atherosclerosis. Low-density blood pool. Mild cardiomegaly. Hepatobiliary: Cholecystectomy. Pancreas: Unremarkable Spleen: Unremarkable Adrenals/Urinary Tract: Subtle nodularity of the lateral limb left adrenal gland measuring 1.0 by 0.8 cm on image 22/2; this nodularity was not present on the prior exam from 07/10/2013. There is some motion artifact that may be confusing this appearance. Stable 10 mm hypodensity anteriorly in the left mid kidney on image 26/2, nonspecific. Significant portions of the kidneys are obscured by motion artifact. No hydronephrosis or visible urinary tract calculus. Stomach/Bowel: Prominent stool throughout the colon favors constipation. Vascular/Lymphatic: Aortoiliac atherosclerotic vascular disease. Reproductive: Unremarkable Other: No supplemental non-categorized findings. Musculoskeletal: Calcifications proximally in the hamstring tendons. Spurring of the pubic symphysis. Degenerative articular cartilage thinning along the hip joints with chondrocalcinosis. 9 mm of degenerative anterolisthesis at L4-5. Lumbar spondylosis and degenerative disc disease. IMPRESSION: 1. A specific cause for hematuria is not identified. There is a 10 mm hypodense lesion anteriorly in the left mid kidney which is not changed from prior and technically nonspecific on today's noncontrast examination. Clearly, negative predictive value for smaller renal masses and urothelial tumor is significantly reduced due to the lack of IV contrast, as well as the considerable motion artifact on today's exam. 2. Slight nodularity of the lateral limb of the left adrenal gland. Given the patient's lung mass, the possibility of adrenal metastatic disease is not entirely excluded. However, this is still very small and subtle, and is also obscured by motion artifact. Attention to the left adrenal gland on follow up studies is recommended. 3.  Other imaging findings of potential clinical significance: Moderate left pleural effusion. Peripheral interstitial accentuation at the lung bases. Coronary atherosclerosis. Low-density blood pool suggesting anemia. Mild cardiomegaly. Prominent stool throughout the colon favors constipation. Aortoiliac atherosclerotic vascular disease. 9 mm degenerative anterolisthesis at L4-5 with lumbar spondylosis and degenerative disc disease. Chondral thinning and spurring in the hips with chondrocalcinosis raising the possibility of CPPD arthropathy. Electronically Signed   By: Van Clines M.D.   On: 01/02/2017 11:17   Ct Chest Wo Contrast  Result Date: 12/26/2016 CLINICAL DATA:  Lung cancer, chemotherapy and radiation therapy complete. Weight loss in shortness of breath. EXAM: CT CHEST WITHOUT CONTRAST TECHNIQUE: Multidetector CT imaging of the chest was performed following the standard protocol without IV contrast. COMPARISON:  09/19/2016 and PET 06/17/2016. FINDINGS: Cardiovascular: Atherosclerotic calcification of the arterial vasculature, including three-vessel involvement of the coronary arteries. Heart is enlarged. Small amount of pericardial fluid is new. Mediastinum/Nodes: No pathologically enlarged mediastinal or axillary lymph nodes. Hilar regions are difficult to evaluate without IV contrast. Esophagus is grossly unremarkable. Lungs/Pleura: Worsening in subpleural reticulation, ground-glass architectural distortion bilaterally. New consolidation, bronchiectasis and architectural distortion in the left hemithorax, with a partially loculated small to moderate left pleural effusion, new. Airway is unremarkable. Upper Abdomen: Visualized portions of the liver and right adrenal gland are unremarkable. There is a tiny nodule in the lateral limb left adrenal gland measuring 1 cm (series 2 image 141),  not definitely seen on prior exams. Visualized portions of the kidneys, spleen, pancreas, stomach and bowel are  otherwise unremarkable with exception of a tiny hiatal hernia. No upper abdominal adenopathy. Cholecystectomy. Musculoskeletal: No worrisome lytic or sclerotic lesions. Degenerative changes are seen in the spine. IMPRESSION: 1. Extensive consolidation, bronchiectasis and architectural distortion in the left upper and left lower lobes, new and presumably treatment related. 2. Small to moderate partially loculated left pleural effusion, new. 3. Small amount of pericardial fluid is new. 4. Tiny nodule in the lateral limb left adrenal gland appears new. Continued attention on followup exams is warranted as metastatic disease cannot be excluded. 5. Progression of subpleural reticulation, ground-glass and bronchiolectasis, worrisome for usual interstitial pneumonitis or fibrotic nonspecific interstitial pneumonitis, possibly drug-related. 6. Aortic atherosclerosis (ICD10-170.0). Three-vessel coronary artery calcification. Electronically Signed   By: Lorin Picket M.D.   On: 12/26/2016 15:37    Impression: Stage IIB (T2a, N1, M0) non-small cell lung cancer, squamous cell carcinoma, presented with left hilar mass. The patient has completed a pre-operative dose of radiation. She is not a candidate for surgery. The patient was unable to complete additional radiation therapy due to the flu and performance status She is clinically stable. The patient continues on 2L of oxygen at this time. Recent chest CT scans shows possible recurrence for which she will follow up with a PET scan.  Plan: The patient will follow up in radiation oncology prn. She will undergo a PET scan in the near future and remain under close follow up with Dr. Julien Nordmann. ____________________________________ -----------------------------------  Blair Promise, PhD, MD  This document serves as a record of services personally performed by Gery Pray, MD. It was created on his behalf by Bethann Humble, a trained medical scribe. The creation of this  record is based on the scribe's personal observations and the provider's statements to them. This document has been checked and approved by the attending provider.

## 2017-01-02 NOTE — Telephone Encounter (Signed)
Gave patient AVs and calender per 01/02/2017 los. - Patient wanted afternoon appt and next available was 4/23. [facility] to contact patient with Pet scan schedule.

## 2017-01-02 NOTE — Progress Notes (Signed)
Renee Vance [phone]   [phone]  OFFICE PROGRESS NOTE  [name], PA-C [place] [address]  DIAGNOSIS: Stage IIB (T2a, N1, M0) non-small cell lung cancer, squamous cell carcinoma presented with left hilar mass diagnosed in August 2017.  PRIOR THERAPY: Concurrent chemoradiation with weekly carboplatin for AUC of 2 and paclitaxel 45 MG/M2 started 07/05/2016, status post 5 cycles. Last cycle was given 08/01/2016.  CURRENT THERAPY: Observation.  INTERVAL HISTORY: Renee Vance 76 y.o. female returns to the clinic today for follow-up visit accompanied by her husband. The patient is feeling fine except for the baseline shortness of breath and she is currently on home oxygen. She also has dry cough with no hemoptysis. She denied having any fever or chills. She has no weight loss or night sweats. She denied having any nausea, vomiting, diarrhea or constipation. She had repeat CT scan of the chest performed last week as well as CT scan of the abdomen earlier today and she is here for evaluation and discussion of her scan results.   MEDICAL HISTORY: Past Medical History:  Diagnosis Date  . 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 Bradycardia   . CAD (coronary artery disease)   . COPD (chronic obstructive pulmonary disease) ([place])   . Coronary atherosclerosis of native coronary artery 2011  . Encounter for antineoplastic chemotherapy 06/23/2016  . Fibrocystic breast   . History of radiation therapy 07/05/16 - 08/09/16    Left Lung: 45 Gy in 25 fractions  . HTN (hypertension)   . Hyperlipidemia   . Hypothyroidism   . Obesity   . Pain in joint   . Right renal mass 11/30/2016  . Stage II squamous cell carcinoma of left lung ([place]) 06/23/2016    ALLERGIES:  is allergic to ampicillin and codeine.  MEDICATIONS:  Current Outpatient Prescriptions  Medication Sig Dispense Refill  . amLODipine (NORVASC) 5  MG tablet Take 5 mg by mouth daily.    Marland Kitchen aspirin 81 MG tablet Take 81 mg by mouth daily.    Marland Kitchen atorvastatin (LIPITOR) 80 MG tablet Take 80 mg by mouth daily.    . budesonide (PULMICORT) 0.5 MG/2ML nebulizer solution Take 2 mLs (0.5 mg total) by nebulization 2 (two) times daily. 2 mL 60  . dextromethorphan (DELSYM) 30 MG/5ML liquid Take 60 mg by mouth 2 (two) times daily.     . furosemide (LASIX) 20 MG tablet Take 1 tablet (20 mg total) by mouth daily as needed (swelling/weight gain over 2 or more pounds overnight). 90 tablet 3  . ipratropium-albuterol (DUONEB) 0.5-2.5 (3) MG/3ML SOLN Take 3 mLs by nebulization every 6 (six) hours as needed. 360 mL 0  . levothyroxine (SYNTHROID, LEVOTHROID) 125 MCG tablet Take 125 mcg by mouth daily before breakfast.     . metoprolol succinate (TOPROL-XL) 50 MG 24 hr tablet Take 1 tablet (50 mg total) by mouth 2 (two) times daily. 180 tablet 1  . OXYGEN Inhale 2 L into the lungs continuous.    . Vitamin D, Cholecalciferol, 1000 UNITS CAPS Take 1,000 Units by mouth daily.     Marland Kitchen warfarin (COUMADIN) 5 MG tablet Take 1/2 tablet daily except 1 tablet on Sundays and Thursdays (Patient taking differently: Take 2.'5mg'$  Sunday, Tuesday, Thursday. Take '5mg'$  by mouth on Monday, Wednesday, Friday, Saturday) 90 tablet 3   No current facility-administered medications for this visit.     SURGICAL HISTORY:  Past Surgical History:  Procedure Laterality  Date  . CHOLECYSTECTOMY    . CORONARY ANGIOPLASTY WITH STENT PLACEMENT  2011   [place] 30-40%, LAD 65-75% (FFR 0.88), CFX 55-60%, RCA 95%>0 w/ 2.5 x 12 mm monorail stent  . TUBAL LIGATION    . VIDEO BRONCHOSCOPY Bilateral 06/07/2016   Procedure: VIDEO BRONCHOSCOPY WITH FLUORO;  Surgeon: [name], MD;  Location: [facility];  Service: Cardiopulmonary;  Laterality: Bilateral;    REVIEW OF SYSTEMS:  A comprehensive review of systems was negative except for: Constitutional: positive for fatigue Respiratory: positive for cough and  dyspnea on exertion   PHYSICAL EXAMINATION: General appearance: alert, cooperative, fatigued and no distress Head: Normocephalic, without obvious abnormality, atraumatic Neck: no adenopathy, no JVD, supple, symmetrical, trachea midline and thyroid not enlarged, symmetric, no tenderness/mass/nodules Lymph nodes: Cervical, supraclavicular, and axillary nodes normal. Resp: rhonchi bilaterally and wheezes bilaterally Back: symmetric, no curvature. ROM normal. No CVA tenderness. Cardio: regular rate and rhythm, S1, S2 normal, no murmur, click, rub or gallop GI: soft, non-tender; bowel sounds normal; no masses,  no organomegaly Extremities: extremities normal, atraumatic, no cyanosis or edema  ECOG PERFORMANCE STATUS: 1 - Symptomatic but completely ambulatory  Blood pressure (!) 118/45, pulse 71, temperature 97.8 F (36.6 C), temperature source Oral, resp. rate 18, height '5\' 4"'$  (1.626 m), weight 148 lb (67.1 kg), SpO2 95 %.  LABORATORY DATA: Lab Results  Component Value Date   WBC 7.3 12/26/2016   HGB 9.0 (L) 12/26/2016   HCT 26.8 (L) 12/26/2016   MCV 90.0 12/26/2016   PLT 243 12/26/2016      Chemistry      Component Value Date/Time   NA 141 12/26/2016 1132   K 4.4 12/26/2016 1132   CL 99 (L) 11/03/2016 0516   CO2 27 12/26/2016 1132   BUN 28.2 (H) 12/26/2016 1132   CREATININE 1.7 (H) 12/26/2016 1132      Component Value Date/Time   CALCIUM 9.7 12/26/2016 1132   ALKPHOS 81 12/26/2016 1132   AST 22 12/26/2016 1132   ALT 17 12/26/2016 1132   BILITOT 0.48 12/26/2016 1132       RADIOGRAPHIC STUDIES: Ct Chest Wo Contrast  Result Date: 12/26/2016 CLINICAL DATA:  Lung cancer, chemotherapy and radiation therapy complete. Weight loss in shortness of breath. EXAM: CT CHEST WITHOUT CONTRAST TECHNIQUE: Multidetector CT imaging of the chest was performed following the standard protocol without IV contrast. COMPARISON:  09/19/2016 and PET 06/17/2016. FINDINGS: Cardiovascular:  Atherosclerotic calcification of the arterial vasculature, including three-vessel involvement of the coronary arteries. Heart is enlarged. Small amount of pericardial fluid is new. Mediastinum/Nodes: No pathologically enlarged mediastinal or axillary lymph nodes. Hilar regions are difficult to evaluate without IV contrast. Esophagus is grossly unremarkable. Lungs/Pleura: Worsening in subpleural reticulation, ground-glass architectural distortion bilaterally. New consolidation, bronchiectasis and architectural distortion in the left hemithorax, with a partially loculated small to moderate left pleural effusion, new. Airway is unremarkable. Upper Abdomen: Visualized portions of the liver and right adrenal gland are unremarkable. There is a tiny nodule in the lateral limb left adrenal gland measuring 1 cm (series 2 image 141), not definitely seen on prior exams. Visualized portions of the kidneys, spleen, pancreas, stomach and bowel are otherwise unremarkable with exception of a tiny hiatal hernia. No upper abdominal adenopathy. Cholecystectomy. Musculoskeletal: No worrisome lytic or sclerotic lesions. Degenerative changes are seen in the spine. IMPRESSION: 1. Extensive consolidation, bronchiectasis and architectural distortion in the left upper and left lower lobes, new and presumably treatment related. 2. Small to moderate partially loculated  left pleural effusion, new. 3. Small amount of pericardial fluid is new. 4. Tiny nodule in the lateral limb left adrenal gland appears new. Continued attention on followup exams is warranted as metastatic disease cannot be excluded. 5. Progression of subpleural reticulation, ground-glass and bronchiolectasis, worrisome for usual interstitial pneumonitis or fibrotic nonspecific interstitial pneumonitis, possibly drug-related. 6. Aortic atherosclerosis (ICD10-170.0). Three-vessel coronary artery calcification. Electronically Signed   By: Lorin Picket M.D.   On: 12/26/2016 15:37     ASSESSMENT AND PLAN:  This is a very pleasant 76 years old white female with history of unresectable a stage IIb non-small cell lung cancer, squamous cell carcinoma status post 5 weeks of concurrent chemoradiation with weekly carboplatin and paclitaxel with partial response. The patient was not a good surgical candidate for resection and she has been on observation. Her recent CT scan of the chest showed persistent consolidation and fibrosis and the left lower lobe lung area and left hilar. There is a questionable nodule in the left adrenal gland that is new and suspicious for metastatic disease. I discussed the scan results with the patient and her husband today. I recommended for her to have a PET scan for further evaluation of her disease and to rule out any metastasis. I will see her back for follow-up visit in 2 weeks for evaluation and discussion of her PET scan results and further recommendation regarding treatment of her condition. For COPD she would continue her current treatment with the Spiriva, Pulmicort, DuoNeb and [employer]. The patient was advised to call immediately if she has any concerning symptoms in the interval. The patient voices understanding of current disease status and treatment options and is in agreement with the current care plan.  All questions were answered. The patient knows to call the clinic with any problems, questions or concerns. We can certainly see the patient much sooner if necessary. I spent 10 minutes counseling the patient face to face. The total time spent in the appointment was 15 minutes.  Disclaimer: This note was dictated with voice recognition software. Similar sounding words can inadvertently be transcribed and may not be corrected upon review.

## 2017-01-02 NOTE — Progress Notes (Signed)
Renee Vance is here for follow up after treatment to her left lung.  She denies having any pain.  She reports having shortness of breath with any activity.  She is using 2 L of oxygen.  She reports having an occasional productive cough with beige sputum.  She reports her nose bleeds when she blows it.  She denies having any trouble swallowing or having a sore throat.  She reports having fatigue in the evenings.  Vitals were taken in Dr. Worthy Flank office today: bp 118/45, hr 71, RR 18, temp 97.8 and O2 sat of 95% on 2 L.  Wt Readings from Last 3 Encounters:  01/02/17 148 lb (67.1 kg)  12/06/16 145 lb 6.4 oz (66 kg)  11/28/16 144 lb (65.3 kg)

## 2017-01-03 ENCOUNTER — Ambulatory Visit (INDEPENDENT_AMBULATORY_CARE_PROVIDER_SITE_OTHER): Payer: PPO | Admitting: *Deleted

## 2017-01-03 DIAGNOSIS — Z5181 Encounter for therapeutic drug level monitoring: Secondary | ICD-10-CM

## 2017-01-03 DIAGNOSIS — I480 Paroxysmal atrial fibrillation: Secondary | ICD-10-CM

## 2017-01-03 LAB — POCT INR: INR: 2.3

## 2017-01-11 ENCOUNTER — Ambulatory Visit: Payer: PPO | Admitting: Pulmonary Disease

## 2017-01-18 ENCOUNTER — Ambulatory Visit (HOSPITAL_COMMUNITY)
Admission: RE | Admit: 2017-01-18 | Discharge: 2017-01-18 | Disposition: A | Discharge status: Home / Self Care | Payer: PPO | Source: Ambulatory Visit | Attending: Internal Medicine | Admitting: Internal Medicine

## 2017-01-18 DIAGNOSIS — C3492 Malignant neoplasm of unspecified part of left bronchus or lung: Secondary | ICD-10-CM | POA: Diagnosis not present

## 2017-01-18 DIAGNOSIS — I700 Atherosclerosis of aorta: Secondary | ICD-10-CM | POA: Insufficient documentation

## 2017-01-18 DIAGNOSIS — I708 Atherosclerosis of other arteries: Secondary | ICD-10-CM | POA: Insufficient documentation

## 2017-01-18 DIAGNOSIS — R918 Other nonspecific abnormal finding of lung field: Secondary | ICD-10-CM | POA: Insufficient documentation

## 2017-01-18 DIAGNOSIS — J90 Pleural effusion, not elsewhere classified: Secondary | ICD-10-CM | POA: Insufficient documentation

## 2017-01-18 DIAGNOSIS — I2510 Atherosclerotic heart disease of native coronary artery without angina pectoris: Secondary | ICD-10-CM | POA: Insufficient documentation

## 2017-01-18 LAB — GLUCOSE, CAPILLARY: GLUCOSE-CAPILLARY: 100 mg/dL — AB (ref 65–99)

## 2017-01-18 MED ORDER — FLUDEOXYGLUCOSE F - 18 (FDG) INJECTION
7.8000 | Freq: Once | INTRAVENOUS | Status: AC
  Administered 2017-01-18: 7.8 via INTRAVENOUS

## 2017-01-24 ENCOUNTER — Ambulatory Visit (INDEPENDENT_AMBULATORY_CARE_PROVIDER_SITE_OTHER): Payer: PPO | Admitting: *Deleted

## 2017-01-24 DIAGNOSIS — Z5181 Encounter for therapeutic drug level monitoring: Secondary | ICD-10-CM

## 2017-01-24 DIAGNOSIS — I480 Paroxysmal atrial fibrillation: Secondary | ICD-10-CM | POA: Diagnosis not present

## 2017-01-24 LAB — POCT INR: INR: 2.4

## 2017-01-30 ENCOUNTER — Ambulatory Visit
Admission: RE | Admit: 2017-01-30 | Discharge: 2017-01-30 | Disposition: A | Discharge status: Home / Self Care | Payer: PPO | Source: Ambulatory Visit | Attending: Radiation Oncology | Admitting: Radiation Oncology

## 2017-01-30 ENCOUNTER — Ambulatory Visit (HOSPITAL_BASED_OUTPATIENT_CLINIC_OR_DEPARTMENT_OTHER): Payer: PPO | Admitting: Internal Medicine

## 2017-01-30 ENCOUNTER — Telehealth: Payer: Self-pay | Admitting: Internal Medicine

## 2017-01-30 ENCOUNTER — Encounter: Payer: Self-pay | Admitting: Internal Medicine

## 2017-01-30 ENCOUNTER — Telehealth: Payer: Self-pay | Admitting: Oncology

## 2017-01-30 VITALS — BP 142/57 | HR 69 | Temp 98.5°F | Resp 17 | Ht 64.0 in | Wt 146.3 lb

## 2017-01-30 DIAGNOSIS — I10 Essential (primary) hypertension: Secondary | ICD-10-CM

## 2017-01-30 DIAGNOSIS — C3492 Malignant neoplasm of unspecified part of left bronchus or lung: Secondary | ICD-10-CM

## 2017-01-30 DIAGNOSIS — E039 Hypothyroidism, unspecified: Secondary | ICD-10-CM | POA: Diagnosis not present

## 2017-01-30 DIAGNOSIS — C3412 Malignant neoplasm of upper lobe, left bronchus or lung: Secondary | ICD-10-CM

## 2017-01-30 DIAGNOSIS — E279 Disorder of adrenal gland, unspecified: Secondary | ICD-10-CM

## 2017-01-30 NOTE — Progress Notes (Signed)
[facility] [phone]   [phone]  OFFICE PROGRESS NOTE  [name], PA-C [place] [address]  DIAGNOSIS: Stage IIB (T2a, N1, M0) non-small cell lung cancer, squamous cell carcinoma presented with left hilar mass diagnosed in August 2017.  PRIOR THERAPY: Concurrent chemoradiation with weekly carboplatin for AUC of 2 and paclitaxel 45 MG/M2 started 07/05/2016, status post 5 cycles. Last cycle was given 08/01/2016.  CURRENT THERAPY: Observation.  INTERVAL HISTORY: Renee Vance 76 y.o. female returns to the clinic today for follow-up visit accompanied by her husband. The patient is feeling fine today with no specific complaints. She denied having any chest pain, shortness of breath except with exertion and she is currently on home oxygen. She has no hemoptysis. She denied having any nausea, vomiting, diarrhea or constipation. She has no significant weight loss or night sweats. She was found on recent CT scan of the chest to have suspicious left adrenal gland nodule in addition to some density in the left lung. I ordered a PET scan which was performed recently and the patient is here today for evaluation and discussion of the PET scan results and treatment options.  MEDICAL HISTORY: Past Medical History:  Diagnosis Date  . 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 Bradycardia   . CAD (coronary artery disease)   . COPD (chronic obstructive pulmonary disease) ([place])   . Coronary atherosclerosis of native coronary artery 2011  . Encounter for antineoplastic chemotherapy 06/23/2016  . Fibrocystic breast   . History of radiation therapy 07/05/16 - 08/09/16    Left Lung: 45 Gy in 25 fractions  . HTN (hypertension)   . Hyperlipidemia   . Hypothyroidism   . Obesity   . Pain in joint   . Right renal mass 11/30/2016  . Stage II squamous cell carcinoma of left lung ([place]) 06/23/2016    ALLERGIES:  is allergic  to ampicillin and codeine.  MEDICATIONS:  Current Outpatient Prescriptions  Medication Sig Dispense Refill  . amLODipine (NORVASC) 5 MG tablet Take 5 mg by mouth daily.    Marland Kitchen aspirin 81 MG tablet Take 81 mg by mouth daily.    Marland Kitchen atorvastatin (LIPITOR) 80 MG tablet Take 80 mg by mouth daily.    . budesonide (PULMICORT) 0.5 MG/2ML nebulizer solution Take 2 mLs (0.5 mg total) by nebulization 2 (two) times daily. 2 mL 60  . dextromethorphan (DELSYM) 30 MG/5ML liquid Take 60 mg by mouth 2 (two) times daily.     . furosemide (LASIX) 20 MG tablet Take 20 mg by mouth every other day.    . ipratropium-albuterol (DUONEB) 0.5-2.5 (3) MG/3ML SOLN Take 3 mLs by nebulization every 6 (six) hours as needed. 360 mL 0  . levothyroxine (SYNTHROID, LEVOTHROID) 125 MCG tablet Take 125 mcg by mouth daily before breakfast.     . metoprolol succinate (TOPROL-XL) 50 MG 24 hr tablet Take 1 tablet (50 mg total) by mouth 2 (two) times daily. 180 tablet 1  . OXYGEN Inhale 2 L into the lungs continuous.    . Vitamin D, Cholecalciferol, 1000 UNITS CAPS Take 1,000 Units by mouth daily.     Marland Kitchen warfarin (COUMADIN) 5 MG tablet Take 1/2 tablet daily except 1 tablet on Sundays and Thursdays (Patient taking differently: Take 2.'5mg'$  Sunday, Tuesday, Thursday. Take '5mg'$  by mouth on Monday, Wednesday, Friday, Saturday) 90 tablet 3   No current facility-administered medications for this visit.     SURGICAL HISTORY:  Past Surgical History:  Procedure Laterality Date  . CHOLECYSTECTOMY    . CORONARY ANGIOPLASTY WITH STENT PLACEMENT  2011   Lmain 30-40%, LAD 65-75% (FFR 0.88), CFX 55-60%, RCA 95%>0 w/ 2.5 x 12 mm monorail stent  . TUBAL LIGATION    . VIDEO BRONCHOSCOPY Bilateral 06/07/2016   Procedure: VIDEO BRONCHOSCOPY WITH FLUORO;  Surgeon: [name], MD;  Location: [facility];  Service: Cardiopulmonary;  Laterality: Bilateral;    REVIEW OF SYSTEMS:  Constitutional: positive for fatigue Eyes: negative Ears, nose, mouth,  throat, and face: negative Respiratory: positive for dyspnea on exertion Cardiovascular: negative Gastrointestinal: negative Genitourinary:negative Integument/breast: negative Hematologic/lymphatic: negative Musculoskeletal:negative Neurological: negative Behavioral/Psych: negative Endocrine: negative Allergic/Immunologic: negative   PHYSICAL EXAMINATION: General appearance: alert, cooperative, fatigued and no distress Head: Normocephalic, without obvious abnormality, atraumatic Neck: no adenopathy, no JVD, supple, symmetrical, trachea midline and thyroid not enlarged, symmetric, no tenderness/mass/nodules Lymph nodes: Cervical, supraclavicular, and axillary nodes normal. Resp: rhonchi bilaterally and wheezes bilaterally Back: symmetric, no curvature. ROM normal. No CVA tenderness. Cardio: regular rate and rhythm, S1, S2 normal, no murmur, click, rub or gallop GI: soft, non-tender; bowel sounds normal; no masses,  no organomegaly Extremities: extremities normal, atraumatic, no cyanosis or edema Neurologic: Alert and oriented X 3, normal strength and tone. Normal symmetric reflexes. Normal coordination and gait  ECOG PERFORMANCE STATUS: 1 - Symptomatic but completely ambulatory  Blood pressure (!) 142/57, pulse 69, temperature 98.5 F (36.9 C), temperature source Oral, resp. rate 17, height '5\' 4"'$  (1.626 m), weight 146 lb 4.8 oz (66.4 kg), SpO2 100 %.  LABORATORY DATA: Lab Results  Component Value Date   WBC 7.3 12/26/2016   HGB 9.0 (L) 12/26/2016   HCT 26.8 (L) 12/26/2016   MCV 90.0 12/26/2016   PLT 243 12/26/2016      Chemistry      Component Value Date/Time   NA 141 12/26/2016 1132   K 4.4 12/26/2016 1132   CL 99 (L) 11/03/2016 0516   CO2 27 12/26/2016 1132   BUN 28.2 (H) 12/26/2016 1132   CREATININE 1.7 (H) 12/26/2016 1132      Component Value Date/Time   CALCIUM 9.7 12/26/2016 1132   ALKPHOS 81 12/26/2016 1132   AST 22 12/26/2016 1132   ALT 17 12/26/2016 1132     BILITOT 0.48 12/26/2016 1132       RADIOGRAPHIC STUDIES: Ct Abdomen Pelvis Wo Contrast  Result Date: 01/02/2017 CLINICAL DATA:  Gross hematuria. Renal mass. Squamous cell carcinoma of the left upper lobe. EXAM: CT ABDOMEN AND PELVIS WITHOUT CONTRAST TECHNIQUE: Multidetector CT imaging of the abdomen and pelvis was performed following the standard protocol without IV contrast. COMPARISON:  Multiple exams, including 10/31/2016 FINDINGS: Despite efforts by the technologist and patient, motion artifact is present on today's exam and could not be eliminated. This reduces exam sensitivity and specificity. Lower chest: Moderate left pleural effusion with passive atelectasis. Peripheral interstitial accentuation at the lung bases. Coronary atherosclerosis. Low-density blood pool. Mild cardiomegaly. Hepatobiliary: Cholecystectomy. Pancreas: Unremarkable Spleen: Unremarkable Adrenals/Urinary Tract: Subtle nodularity of the lateral limb left adrenal gland measuring 1.0 by 0.8 cm on image 22/2; this nodularity was not present on the prior exam from 07/10/2013. There is some motion artifact that may be confusing this appearance. Stable 10 mm hypodensity anteriorly in the left mid kidney on image 26/2, nonspecific. Significant portions of the kidneys are obscured by motion artifact. No hydronephrosis or visible urinary tract calculus. Stomach/Bowel: Prominent stool throughout the colon favors constipation. Vascular/Lymphatic: Aortoiliac atherosclerotic  vascular disease. Reproductive: Unremarkable Other: No supplemental non-categorized findings. Musculoskeletal: Calcifications proximally in the hamstring tendons. Spurring of the pubic symphysis. Degenerative articular cartilage thinning along the hip joints with chondrocalcinosis. 9 mm of degenerative anterolisthesis at L4-5. Lumbar spondylosis and degenerative disc disease. IMPRESSION: 1. A specific cause for hematuria is not identified. There is a 10 mm hypodense  lesion anteriorly in the left mid kidney which is not changed from prior and technically nonspecific on today's noncontrast examination. Clearly, negative predictive value for smaller renal masses and urothelial tumor is significantly reduced due to the lack of IV contrast, as well as the considerable motion artifact on today's exam. 2. Slight nodularity of the lateral limb of the left adrenal gland. Given the patient's lung mass, the possibility of adrenal metastatic disease is not entirely excluded. However, this is still very small and subtle, and is also obscured by motion artifact. Attention to the left adrenal gland on follow up studies is recommended. 3. Other imaging findings of potential clinical significance: Moderate left pleural effusion. Peripheral interstitial accentuation at the lung bases. Coronary atherosclerosis. Low-density blood pool suggesting anemia. Mild cardiomegaly. Prominent stool throughout the colon favors constipation. Aortoiliac atherosclerotic vascular disease. 9 mm degenerative anterolisthesis at L4-5 with lumbar spondylosis and degenerative disc disease. Chondral thinning and spurring in the hips with chondrocalcinosis raising the possibility of CPPD arthropathy. Electronically Signed   By: Van Clines M.D.   On: 01/02/2017 11:17   Nm Pet Image Restag (ps) Skull Base To Thigh  Result Date: 01/18/2017 CLINICAL DATA:  Subsequent treatment strategy for squamous cell carcinoma of the left lung. Small new left adrenal nodule. EXAM: NUCLEAR MEDICINE PET SKULL BASE TO THIGH TECHNIQUE: 7.8 mCi F-18 FDG was injected intravenously. Full-ring PET imaging was performed from the skull base to thigh after the radiotracer. CT data was obtained and used for attenuation correction and anatomic localization. FASTING BLOOD GLUCOSE:  Value: 100 mg/dl COMPARISON:  Multiple exams, including 06/17/2016 and CT scan from 01/02/2017 FINDINGS: NECK No hypermetabolic lymph nodes in the neck.  Bilateral carotid atherosclerotic calcifications. 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 Maximum SUV in this region of presumed pneumonitis is approximately 3.8. The previous left infrahilar mass with maximum SUV of 30.0 is no longer seen. Suspected peripheral fibrosis in the lungs. Coronary, aortic arch, and branch vessel atherosclerotic vascular disease. Currently no hypermetabolic pathologic adenopathy in the chest is identified. ABDOMEN/PELVIS The left adrenal nodule of concern measures 0.8 by 1.1 cm but has a maximum standard uptake value of 21.1, indicating malignancy. This is a new finding compared to the prior PET-CT. No other suspicious hypermetabolic lesions in the abdomen or pelvis. Cholecystectomy noted.  Aortoiliac atherosclerotic vascular disease. SKELETON No focal hypermetabolic activity to suggest skeletal metastasis. There is asymmetric left-sided sternoclavicular arthropathy with some faint inflammatory activity felt to be related to arthropathy rather than neoplasm involving the sternoclavicular joint. IMPRESSION: 1. The small new nodule of the lateral limb left adrenal gland is highly hypermetabolic, maximum SUV 27.0, compatible with metastatic disease. 2. The original left infrahilar lesion is no longer discretely identified. There is considerable airspace opacity in this vicinity suggesting radiation pneumonitis, but only low-grade metabolic activity with maximum SUV of 3.8 (previously 30.0). There is also a moderate left pleural effusion. 3. Peripheral fibrosis in the lungs. 4. Coronary, aortic arch, and branch vessel atherosclerotic vascular disease. Aortoiliac atherosclerotic  vascular disease. Electronically Signed   By: Van Clines M.D.   On: 01/18/2017 16:40    ASSESSMENT AND PLAN:  This is a very pleasant 76 years old white female with  history of unresectable a stage IIb non-small cell lung cancer, squamous cell carcinoma status post course of concurrent chemoradiation with weekly carboplatin and paclitaxel for 5 weeks with partial response. She was negative good surgical candidate for resection. The patient has been on observation for several months and unfortunately the recent CT scan of the chest showed suspicious finding especially new left adrenal gland nodule. She had a recent PET scan performed on 11/20/2016. I personally and independently reviewed the PET scan images and discuss the results with the patient and her husband. Her PET scan showed hypermetabolic activity in the small new nodule in the lateral limb of the left adrenal gland highly suspicious for metastatic disease. The previously treated lesion in the left hilar area has low-grade metabolic activity compared to the time of the initial diagnosis. There was also moderate left pleural effusion. I have a lengthy discussion with the patient about her condition and treatment options. I gave the patient the option of proceeding with CT-guided core biopsy of the hypermetabolic left adrenal gland nodule versus proceeding with treatment without a biopsy. The patient and her husband would like to consider the biopsy first. I will refer the patient to interventional radiology for consideration of the CT-guided core biopsy of the left adrenal gland. I will arrange for the patient to come back for follow-up visit in 3 weeks for evaluation and discussion of her treatment options based on the final pathology. For hypertension the patient will continue her current treatment with Norvasc and Toprol-XL For the hypothyroidism, she will continue her current treatment with levothyroxine. The patient was advised to call immediately if she has any concerning symptoms in the interval. The patient voices understanding of current disease status and treatment options and is in agreement with  the current care plan. All questions were answered. The patient knows to call the clinic with any problems, questions or concerns. We can certainly see the patient much sooner if necessary.  Disclaimer: This note was dictated with voice recognition software. Similar sounding words can inadvertently be transcribed and may not be corrected upon review.

## 2017-01-30 NOTE — Telephone Encounter (Signed)
Gave patient AVS and calender per 4/23 los. - [facility] to contact patient with Biopsy appt. - patient aware

## 2017-01-30 NOTE — Telephone Encounter (Signed)
Left a message for patient regarding her appointment with Dr. Sondra Come today.  Requested a return call.

## 2017-02-02 DIAGNOSIS — C3492 Malignant neoplasm of unspecified part of left bronchus or lung: Secondary | ICD-10-CM | POA: Diagnosis not present

## 2017-02-02 DIAGNOSIS — J449 Chronic obstructive pulmonary disease, unspecified: Secondary | ICD-10-CM | POA: Diagnosis not present

## 2017-02-04 DIAGNOSIS — J449 Chronic obstructive pulmonary disease, unspecified: Secondary | ICD-10-CM | POA: Diagnosis not present

## 2017-02-04 DIAGNOSIS — C3492 Malignant neoplasm of unspecified part of left bronchus or lung: Secondary | ICD-10-CM | POA: Diagnosis not present

## 2017-02-07 ENCOUNTER — Other Ambulatory Visit: Payer: Self-pay | Admitting: Radiology

## 2017-02-07 DIAGNOSIS — M9901 Segmental and somatic dysfunction of cervical region: Secondary | ICD-10-CM | POA: Diagnosis not present

## 2017-02-07 DIAGNOSIS — M546 Pain in thoracic spine: Secondary | ICD-10-CM | POA: Diagnosis not present

## 2017-02-07 DIAGNOSIS — M47812 Spondylosis without myelopathy or radiculopathy, cervical region: Secondary | ICD-10-CM | POA: Diagnosis not present

## 2017-02-07 DIAGNOSIS — M9902 Segmental and somatic dysfunction of thoracic region: Secondary | ICD-10-CM | POA: Diagnosis not present

## 2017-02-07 DIAGNOSIS — S134XXA Sprain of ligaments of cervical spine, initial encounter: Secondary | ICD-10-CM | POA: Diagnosis not present

## 2017-02-07 DIAGNOSIS — M47816 Spondylosis without myelopathy or radiculopathy, lumbar region: Secondary | ICD-10-CM | POA: Diagnosis not present

## 2017-02-07 DIAGNOSIS — M9903 Segmental and somatic dysfunction of lumbar region: Secondary | ICD-10-CM | POA: Diagnosis not present

## 2017-02-08 ENCOUNTER — Encounter (HOSPITAL_COMMUNITY): Payer: Self-pay

## 2017-02-08 ENCOUNTER — Ambulatory Visit (HOSPITAL_COMMUNITY)
Admission: RE | Admit: 2017-02-08 | Discharge: 2017-02-08 | Disposition: A | Discharge status: Home / Self Care | Payer: PPO | Source: Ambulatory Visit | Attending: Internal Medicine | Admitting: Internal Medicine

## 2017-02-08 DIAGNOSIS — Z6825 Body mass index (BMI) 25.0-25.9, adult: Secondary | ICD-10-CM | POA: Diagnosis not present

## 2017-02-08 DIAGNOSIS — Z880 Allergy status to penicillin: Secondary | ICD-10-CM | POA: Diagnosis not present

## 2017-02-08 DIAGNOSIS — E039 Hypothyroidism, unspecified: Secondary | ICD-10-CM | POA: Insufficient documentation

## 2017-02-08 DIAGNOSIS — C3492 Malignant neoplasm of unspecified part of left bronchus or lung: Secondary | ICD-10-CM

## 2017-02-08 DIAGNOSIS — Z87891 Personal history of nicotine dependence: Secondary | ICD-10-CM | POA: Insufficient documentation

## 2017-02-08 DIAGNOSIS — Z955 Presence of coronary angioplasty implant and graft: Secondary | ICD-10-CM | POA: Insufficient documentation

## 2017-02-08 DIAGNOSIS — I10 Essential (primary) hypertension: Secondary | ICD-10-CM | POA: Insufficient documentation

## 2017-02-08 DIAGNOSIS — C7972 Secondary malignant neoplasm of left adrenal gland: Secondary | ICD-10-CM | POA: Insufficient documentation

## 2017-02-08 DIAGNOSIS — Z9981 Dependence on supplemental oxygen: Secondary | ICD-10-CM | POA: Insufficient documentation

## 2017-02-08 DIAGNOSIS — E785 Hyperlipidemia, unspecified: Secondary | ICD-10-CM | POA: Diagnosis not present

## 2017-02-08 DIAGNOSIS — J449 Chronic obstructive pulmonary disease, unspecified: Secondary | ICD-10-CM | POA: Insufficient documentation

## 2017-02-08 DIAGNOSIS — I2510 Atherosclerotic heart disease of native coronary artery without angina pectoris: Secondary | ICD-10-CM | POA: Diagnosis not present

## 2017-02-08 DIAGNOSIS — E669 Obesity, unspecified: Secondary | ICD-10-CM | POA: Diagnosis not present

## 2017-02-08 DIAGNOSIS — C3490 Malignant neoplasm of unspecified part of unspecified bronchus or lung: Secondary | ICD-10-CM | POA: Diagnosis not present

## 2017-02-08 DIAGNOSIS — Z7901 Long term (current) use of anticoagulants: Secondary | ICD-10-CM | POA: Insufficient documentation

## 2017-02-08 DIAGNOSIS — E279 Disorder of adrenal gland, unspecified: Secondary | ICD-10-CM | POA: Diagnosis not present

## 2017-02-08 LAB — BASIC METABOLIC PANEL
Anion gap: 8 (ref 5–15)
BUN: 32 mg/dL — AB (ref 6–20)
CALCIUM: 9.3 mg/dL (ref 8.9–10.3)
CO2: 27 mmol/L (ref 22–32)
CREATININE: 1.82 mg/dL — AB (ref 0.44–1.00)
Chloride: 106 mmol/L (ref 101–111)
GFR calc Af Amer: 30 mL/min — ABNORMAL LOW (ref >60)
GFR, EST NON AFRICAN AMERICAN: 26 mL/min — AB (ref >60)
GLUCOSE: 103 mg/dL — AB (ref 65–99)
Potassium: 4.3 mmol/L (ref 3.5–5.1)
SODIUM: 141 mmol/L (ref 135–145)

## 2017-02-08 LAB — CBC WITH DIFFERENTIAL/PLATELET
BASOS ABS: 0 10*3/uL (ref 0.0–0.1)
Basophils Relative: 1 %
EOS ABS: 0.4 10*3/uL (ref 0.0–0.7)
EOS PCT: 6 %
HCT: 28.8 % — ABNORMAL LOW (ref 36.0–46.0)
Hemoglobin: 9.1 g/dL — ABNORMAL LOW (ref 12.0–15.0)
Lymphocytes Relative: 16 %
Lymphs Abs: 1 10*3/uL (ref 0.7–4.0)
MCH: 29.7 pg (ref 26.0–34.0)
MCHC: 31.6 g/dL (ref 30.0–36.0)
MCV: 94.1 fL (ref 78.0–100.0)
MONO ABS: 0.6 10*3/uL (ref 0.1–1.0)
Monocytes Relative: 10 %
Neutro Abs: 4.2 10*3/uL (ref 1.7–7.7)
Neutrophils Relative %: 67 %
PLATELETS: 226 10*3/uL (ref 150–400)
RBC: 3.06 MIL/uL — AB (ref 3.87–5.11)
RDW: 15.3 % (ref 11.5–15.5)
WBC: 6.2 10*3/uL (ref 4.0–10.5)

## 2017-02-08 LAB — PROTIME-INR
INR: 0.97
PROTHROMBIN TIME: 12.9 s (ref 11.4–15.2)

## 2017-02-08 MED ORDER — SODIUM CHLORIDE 0.9 % IV SOLN
INTRAVENOUS | Status: AC
  Filled 2017-02-08: qty 250

## 2017-02-08 MED ORDER — MIDAZOLAM HCL 2 MG/2ML IJ SOLN
INTRAMUSCULAR | Status: AC
  Filled 2017-02-08: qty 4

## 2017-02-08 MED ORDER — MIDAZOLAM HCL 2 MG/2ML IJ SOLN
INTRAMUSCULAR | Status: AC | PRN
  Administered 2017-02-08: 1 mg via INTRAVENOUS

## 2017-02-08 MED ORDER — FENTANYL CITRATE (PF) 100 MCG/2ML IJ SOLN
INTRAMUSCULAR | Status: AC | PRN
  Administered 2017-02-08: 50 ug via INTRAVENOUS

## 2017-02-08 MED ORDER — FLUMAZENIL 0.5 MG/5ML IV SOLN
INTRAVENOUS | Status: AC
  Filled 2017-02-08: qty 5

## 2017-02-08 MED ORDER — SODIUM CHLORIDE 0.9 % IV SOLN
INTRAVENOUS | Status: DC
  Administered 2017-02-08: 07:00:00 via INTRAVENOUS

## 2017-02-08 MED ORDER — NALOXONE HCL 0.4 MG/ML IJ SOLN
INTRAMUSCULAR | Status: AC
  Filled 2017-02-08: qty 1

## 2017-02-08 MED ORDER — FENTANYL CITRATE (PF) 100 MCG/2ML IJ SOLN
INTRAMUSCULAR | Status: AC
  Filled 2017-02-08: qty 4

## 2017-02-08 NOTE — Procedures (Signed)
Pre procedural Dx: Adrenal Nodule  Post procedural Dx: Same  Technically successful CT guided biopsy of indeterminate left adrenal gland nodule.   EBL: None.   Complications: None immediate.   Ronny Bacon, MD Pager #: [phone]

## 2017-02-08 NOTE — H&P (Signed)
Chief Complaint: lung cancer with left adrenal mass  Referring Physician:Dr. [name]  Supervising Physician: Sandi Mariscal  Patient Status: [facility] - Out-pt  HPI: Renee Vance is a 76 y.o. female who was diagnosed with lung cancer last year had a recent PET scan that revealed a hypermetabolic lesion in her left adrenal gland.  She had been undergoing chemotherapy as well as radiation with her last cycle in 2017.  Her PET scan was done for restaging purposes.  Because of this finding, a request for a biopsy of this lesion has been made.  The patient denies any other symptoms except for SOB.  Since she was admitted in January of 2018 with PNA, she has required O2 at home.  Past Medical History:  Past Medical History:  Diagnosis Date  . 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 Bradycardia   . CAD (coronary artery disease)   . COPD (chronic obstructive pulmonary disease) ([place])   . Coronary atherosclerosis of native coronary artery 2011  . Encounter for antineoplastic chemotherapy 06/23/2016  . Fibrocystic breast   . History of radiation therapy 07/05/16 - 08/09/16    Left Lung: 45 Gy in 25 fractions  . HTN (hypertension)   . Hyperlipidemia   . Hypothyroidism   . Obesity   . Pain in joint   . Right renal mass 11/30/2016  . Stage II squamous cell carcinoma of left lung ([place]) 06/23/2016    Past Surgical History:  Past Surgical History:  Procedure Laterality Date  . CHOLECYSTECTOMY    . CORONARY ANGIOPLASTY WITH STENT PLACEMENT  2011   Lmain 30-40%, LAD 65-75% (FFR 0.88), CFX 55-60%, RCA 95%>0 w/ 2.5 x 12 mm monorail stent  . TUBAL LIGATION    . VIDEO BRONCHOSCOPY Bilateral 06/07/2016   Procedure: VIDEO BRONCHOSCOPY WITH FLUORO;  Surgeon: [name], MD;  Location: [facility];  Service: Cardiopulmonary;  Laterality: Bilateral;    Family History:  Family History  Problem Relation Age of Onset  . Heart disease Mother     Social History:  reports  that she quit smoking about 2 years ago. She has a 30.00 pack-year smoking history. She has never used smokeless tobacco. She reports that she does not drink alcohol or use drugs.  Allergies:  Allergies  Allergen Reactions  . Ampicillin Nausea And Vomiting    Has patient had a PCN reaction causing immediate rash, facial/tongue/throat swelling, SOB or lightheadedness with hypotension: no Has patient had a PCN reaction causing severe rash involving mucus membranes or skin necrosis: no Has patient had a PCN reaction that required hospitalization: no Has patient had a PCN reaction occurring within the last 10 years: no If all of the above answers are "NO", then may proceed with Cephalosporin use  . Codeine Nausea And Vomiting    Medications: Medications reviewed in epic  Please HPI for pertinent positives, otherwise complete 10 system ROS negative.  Mallampati Score: MD Evaluation Airway: WNL Heart: WNL Abdomen: WNL Chest/ Lungs: Other (comments) Chest/ lungs comments: O2 in place wtih some rhonchi on the left side ASA  Classification: 3 Mallampati/Airway Score: Two  Physical Exam: BP (!) 145/49   Temp 98.1 F (36.7 C) (Oral)   Resp 16   Ht _0  (1.626 m)   Wt 146 lb 12.8 oz (66.6 kg)   SpO2 100%   BMI 25.20 kg/m  Body mass index is 25.2 kg/m. General: pleasant, WD, WN white female who is laying in bed in NAD HEENT: head  is normocephalic, atraumatic.  Sclera are noninjected.  PERRL.  Ears and nose without any masses or lesions. [place] in place with O2 running.  Mouth is pink and moist Heart: regular, rate, and rhythm.  Normal s1,s2. No obvious murmurs, gallops, or rubs noted.  Palpable radial pulses bilaterally Lungs: CTAon right side.  Rhonchi noted on left side.  [place] in place with O2 running.  Respiratory effort nonlabored Abd: soft, NT, ND, +BS, no masses, hernias, or organomegaly Psych: A&Ox3 with an appropriate affect.   Labs: Results for orders placed or performed  during the hospital encounter of 02/08/17 (from the past 48 hour(s))  Basic metabolic panel     Status: Abnormal   Collection Time: 02/08/17  7:14 AM  Result Value Ref Range   Sodium 141 135 - 145 mmol/L   Potassium 4.3 3.5 - 5.1 mmol/L   Chloride 106 101 - 111 mmol/L   CO2 27 22 - 32 mmol/L   Glucose, Bld 103 (H) 65 - 99 mg/dL   BUN 32 (H) 6 - 20 mg/dL   Creatinine, Ser 1.82 (H) 0.44 - 1.00 mg/dL   Calcium 9.3 8.9 - 10.3 mg/dL   GFR calc non Af Amer 26 (L) >60 mL/min   GFR calc Af Amer 30 (L) >60 mL/min    Comment: (NOTE) The eGFR has been calculated using the CKD EPI equation. This calculation has not been validated in all clinical situations. eGFR's persistently <60 mL/min signify possible Chronic Kidney Disease.    Anion gap 8 5 - 15  CBC with Differential/Platelet     Status: Abnormal   Collection Time: 02/08/17  7:14 AM  Result Value Ref Range   WBC 6.2 4.0 - 10.5 K/uL   RBC 3.06 (L) 3.87 - 5.11 MIL/uL   Hemoglobin 9.1 (L) 12.0 - 15.0 g/dL   HCT 28.8 (L) 36.0 - 46.0 %   MCV 94.1 78.0 - 100.0 fL   MCH 29.7 26.0 - 34.0 pg   MCHC 31.6 30.0 - 36.0 g/dL   RDW 15.3 11.5 - 15.5 %   Platelets 226 150 - 400 K/uL   Neutrophils Relative % 67 %   Neutro Abs 4.2 1.7 - 7.7 K/uL   Lymphocytes Relative 16 %   Lymphs Abs 1.0 0.7 - 4.0 K/uL   Monocytes Relative 10 %   Monocytes Absolute 0.6 0.1 - 1.0 K/uL   Eosinophils Relative 6 %   Eosinophils Absolute 0.4 0.0 - 0.7 K/uL   Basophils Relative 1 %   Basophils Absolute 0.0 0.0 - 0.1 K/uL  Protime-INR     Status: None   Collection Time: 02/08/17  7:14 AM  Result Value Ref Range   Prothrombin Time 12.9 11.4 - 15.2 seconds   INR 0.97     Imaging: No results found.  Assessment/Plan 1. Non-small cell lung cancer with hypermetabolic left adrenal nodule We will plan to proceed today with a biopsy of this adrenal mass.  The patient does take coumadin and this has been held for 5 days.  Her INR is normal today at 0.97.  Her other  labs and vitals have been reviewed. Risks and Benefits discussed with the patient including, but not limited to bleeding, infection, damage to adjacent structures or low yield requiring additional tests. All of the patient's questions were answered, patient is agreeable to proceed. Consent signed and in chart.  Thank you for this interesting consult.  I greatly enjoyed meeting Renee Vance and look forward to participating in their  care.  A copy of this report was sent to the requesting provider on this date.  Electronically Signed: Henreitta Cea 02/08/2017, 8:33 AM   I spent a total of  30 Minutes   in face to face in clinical consultation, greater than 50% of which was counseling/coordinating care for left adrenal mass

## 2017-02-08 NOTE — Discharge Instructions (Signed)
Needle Biopsy, Care After °These instructions give you information about caring for yourself after your procedure. Your doctor may also give you more specific instructions. Call your doctor if you have any problems or questions after your procedure. °Follow these instructions at home: °· Rest as told by your doctor. °· Take medicines only as told by your doctor. °· There are many different ways to close and cover the biopsy site, including stitches (sutures), skin glue, and adhesive strips. Follow instructions from your doctor about: °¨ How to take care of your biopsy site. °¨ When and how you should change your bandage (dressing). °¨ When you should remove your dressing. °¨ Removing whatever was used to close your biopsy site. °· Check your biopsy site every day for signs of infection. Watch for: °¨ Redness, swelling, or pain. °¨ Fluid, blood, or pus. °Contact a doctor if: °· You have a fever. °· You have redness, swelling, or pain at the biopsy site, and it lasts longer than a few days. °· You have fluid, blood, or pus coming from the biopsy site. °· You feel sick to your stomach (nauseous). °· You throw up (vomit). °Get help right away if: °· You are short of breath. °· You have trouble breathing. °· Your chest hurts. °· You feel dizzy or you pass out (faint). °· You have bleeding that does not stop with pressure or a bandage. °· You cough up blood. °· Your belly (abdomen) hurts. °This information is not intended to replace advice given to you by your health care provider. Make sure you discuss any questions you have with your health care provider. °Document Released: 09/08/2008 Document Revised: 03/03/2016 Document Reviewed: 09/22/2014 °Elsevier Interactive Patient Education © 2017 Else[employer] Inc[employer] ° ° °Moderate Conscious Sedation, Adult, Care After °These instructions provide you with information about caring for yourself after your procedure. Your health care provider may also give you more specific instructions.  Your treatment has been planned according to current medical practices, but problems sometimes occur. Call your health care provider if you have any problems or questions after your procedure. °What can I expect after the procedure? °After your procedure, it is common: °· To feel sleepy for several hours. °· To feel clumsy and have poor balance for several hours. °· To have poor judgment for several hours. °· To vomit if you eat too soon. °Follow these instructions at home: °For at least 24 hours after the procedure:  ° °· Do not: °¨ Participate in activities where you could fall or become injured. °¨ Drive. °¨ Use heavy machinery. °¨ Drink alcohol. °¨ Take sleeping pills or medicines that cause drowsiness. °¨ Make important decisions or sign legal documents. °¨ Take care of children on your own. °· Rest. °Eating and drinking  °· Follow the diet recommended by your health care provider. °· If you vomit: °¨ Drink water, juice, or soup when you can drink without vomiting. °¨ Make sure you have little or no nausea before eating solid foods. °General instructions  °· Have a responsible adult stay with you until you are awake and alert. °· Take over-the-counter and prescription medicines only as told by your health care provider. °· If you smoke, do not smoke without supervision. °· Keep all follow-up visits as told by your health care provider. This is important. °Contact a health care provider if: °· You keep feeling nauseous or you keep vomiting. °· You feel light-headed. °· You develop a rash. °· You have a fever. °Get help right   away if: °· You have trouble breathing. °This information is not intended to replace advice given to you by your health care provider. Make sure you discuss any questions you have with your health care provider. °Document Released: 07/17/2013 Document Revised: 02/29/2016 Document Reviewed: 01/16/2016 °Elsevier Interactive Patient Education © 2017 [employer]vier Inc. ° °

## 2017-02-13 ENCOUNTER — Encounter: Payer: Self-pay | Admitting: Pulmonary Disease

## 2017-02-13 ENCOUNTER — Ambulatory Visit (INDEPENDENT_AMBULATORY_CARE_PROVIDER_SITE_OTHER): Payer: PPO | Admitting: Pulmonary Disease

## 2017-02-13 DIAGNOSIS — J449 Chronic obstructive pulmonary disease, unspecified: Secondary | ICD-10-CM | POA: Diagnosis not present

## 2017-02-13 DIAGNOSIS — N183 Chronic kidney disease, stage 3 (moderate): Secondary | ICD-10-CM | POA: Diagnosis not present

## 2017-02-13 DIAGNOSIS — J9611 Chronic respiratory failure with hypoxia: Secondary | ICD-10-CM

## 2017-02-13 MED ORDER — FLUTICASONE FUROATE-VILANTEROL 100-25 MCG/INH IN AEPB: 1.0000 | INHALATION_SPRAY | Freq: Every day | RESPIRATORY_TRACT | 0 refills | Status: DC

## 2017-02-13 MED ORDER — OMEPRAZOLE 20 MG PO CPDR: 20.0000 mg | DELAYED_RELEASE_CAPSULE | Freq: Every day | ORAL | 2 refills | Status: DC

## 2017-02-13 NOTE — Assessment & Plan Note (Signed)
Stay on oxygen-and we will have DME evaluate your for portable concentrator

## 2017-02-13 NOTE — Assessment & Plan Note (Signed)
Awaiting results of adrenal biopsy She also has moderate pleural effusion likely indicating disease progression We'll have to try alternative therapy for cancer-defer to oncology

## 2017-02-13 NOTE — Patient Instructions (Addendum)
Finish Pulmicort and DuoNeb's After that, start taking ANORO once daily instead- sample. Call back for prescription of this works and check with her insurance for coverage  Use albuterol MDI 2 puffs as needed  Use omeprazole 20 mg daily for 6 weeks for heartburn  Stay on oxygen-and we will have DME evaluate your for portable concentrator

## 2017-02-13 NOTE — Progress Notes (Signed)
Patient seen in the office today and instructed on use of Breo 100.  Patient expressed understanding and demonstrated technique. Benetta Spar [facility] 02/13/17

## 2017-02-13 NOTE — Progress Notes (Signed)
   Subjective:    Patient ID: Renee Vance, female    DOB: [birth year], 76 y.o.   MRN: [id number]  HPI  76 year old ex-smoker  for FU of COPD She smoked more than half pack per day for about 50 years before she quit in 2015- about 30-pack-years. She has chronic atrial fibrillation for which she takes Coumadin   Seen For hemoptysis 05/2016, CT chest showed left hilar mass and bronchoscopy with biopsy showed squamous cell carcinoma T 2 N1 M0. She underwent concurrent chemoradiation. Follow-up CT/PET scan showed moderate left pleural effusion and hypermetabolic left adrenal nodule, just underwent CT-guided biopsy on 5/2 results pending. She was diagnosed with influenza in 10/2016 with Commit acquired pneumonia and was discharged on oxygen and has been maintained on oxygen since then she's also been taking Delsym cough syrup continuously since then  She has been maintained on a regimen of Pulmicort and DuoNeb's twice daily-she has been getting this from [facility] and says that this is really expensive  She reports almost daily heartburn regardless of what she eats  She drops to 81% on walking on room air on second lap, started at 93%  Significant tests/ events reviewed  Spirometry 04/2011 showed ratio 62 with FEV1 of 56%, FVC of 69% and DLCO 73% and corrected for alveolar volume. She was told that she has moderate COPD and was placed on Spiriva with which she is compliant  CT chest  01/2017 -moderate left effusion  Past Medical History:  Diagnosis Date  . 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 Bradycardia   . CAD (coronary artery disease)   . COPD (chronic obstructive pulmonary disease) ([place])   . Coronary atherosclerosis of native coronary artery 2011  . Encounter for antineoplastic chemotherapy 06/23/2016  . Fibrocystic breast   . History of radiation therapy 07/05/16 - 08/09/16    Left Lung: 45 Gy in 25 fractions  . HTN (hypertension)   . Hyperlipidemia   .  Hypothyroidism   . Obesity   . Pain in joint   . Right renal mass 11/30/2016  . 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 Also denies presyncope, palpitations, heartburn, abdominal pain, nausea, vomiting, diarrhea or change in bowel or urinary habits, dysuria,hematuria, rash, arthralgias, visual complaints, headache, numbness weakness or ataxia.     Objective:   Physical Exam   Gen. Pleasant, well-nourished, in no distress ENT - no thrush, no post nasal drip Neck: No JVD, no thyromegaly, no carotid bruits Lungs: no use of accessory muscles, no dullness to percussion, decreased left, without rales or rhonchi  Cardiovascular: Rhythm regular, heart sounds  normal, no murmurs or gallops, no peripheral edema Musculoskeletal: No deformities, no cyanosis or clubbing         Assessment & Plan:

## 2017-02-13 NOTE — Assessment & Plan Note (Signed)
Finish Pulmicort and DuoNeb's After that, start taking ANORO once daily instead- sample. Call back for prescription of this works and check with her insurance for coverage  Use albuterol MDI 2 puffs as needed

## 2017-02-14 DIAGNOSIS — E889 Metabolic disorder, unspecified: Secondary | ICD-10-CM | POA: Diagnosis not present

## 2017-02-14 DIAGNOSIS — I129 Hypertensive chronic kidney disease with stage 1 through stage 4 chronic kidney disease, or unspecified chronic kidney disease: Secondary | ICD-10-CM | POA: Diagnosis not present

## 2017-02-14 DIAGNOSIS — N183 Chronic kidney disease, stage 3 (moderate): Secondary | ICD-10-CM | POA: Diagnosis not present

## 2017-02-14 DIAGNOSIS — M9080 Osteopathy in diseases classified elsewhere, unspecified site: Secondary | ICD-10-CM | POA: Diagnosis not present

## 2017-02-14 DIAGNOSIS — I480 Paroxysmal atrial fibrillation: Secondary | ICD-10-CM | POA: Diagnosis not present

## 2017-02-14 DIAGNOSIS — E559 Vitamin D deficiency, unspecified: Secondary | ICD-10-CM | POA: Diagnosis not present

## 2017-02-14 DIAGNOSIS — N281 Cyst of kidney, acquired: Secondary | ICD-10-CM | POA: Diagnosis not present

## 2017-02-21 ENCOUNTER — Encounter: Payer: Self-pay | Admitting: Internal Medicine

## 2017-02-21 ENCOUNTER — Telehealth: Payer: Self-pay | Admitting: Internal Medicine

## 2017-02-21 ENCOUNTER — Encounter (HOSPITAL_COMMUNITY): Payer: Self-pay

## 2017-02-21 ENCOUNTER — Ambulatory Visit (HOSPITAL_BASED_OUTPATIENT_CLINIC_OR_DEPARTMENT_OTHER): Payer: PPO | Admitting: Internal Medicine

## 2017-02-21 ENCOUNTER — Ambulatory Visit (INDEPENDENT_AMBULATORY_CARE_PROVIDER_SITE_OTHER): Payer: PPO | Admitting: *Deleted

## 2017-02-21 VITALS — BP 139/97 | HR 70 | Temp 97.9°F | Resp 18 | Ht 64.0 in | Wt 146.0 lb

## 2017-02-21 DIAGNOSIS — C7972 Secondary malignant neoplasm of left adrenal gland: Secondary | ICD-10-CM

## 2017-02-21 DIAGNOSIS — Z5181 Encounter for therapeutic drug level monitoring: Secondary | ICD-10-CM | POA: Diagnosis not present

## 2017-02-21 DIAGNOSIS — C3492 Malignant neoplasm of unspecified part of left bronchus or lung: Secondary | ICD-10-CM | POA: Diagnosis not present

## 2017-02-21 DIAGNOSIS — J449 Chronic obstructive pulmonary disease, unspecified: Secondary | ICD-10-CM | POA: Diagnosis not present

## 2017-02-21 DIAGNOSIS — I480 Paroxysmal atrial fibrillation: Secondary | ICD-10-CM

## 2017-02-21 DIAGNOSIS — Z5111 Encounter for antineoplastic chemotherapy: Secondary | ICD-10-CM

## 2017-02-21 DIAGNOSIS — Z9981 Dependence on supplemental oxygen: Secondary | ICD-10-CM

## 2017-02-21 DIAGNOSIS — Z7189 Other specified counseling: Secondary | ICD-10-CM

## 2017-02-21 DIAGNOSIS — J42 Unspecified chronic bronchitis: Secondary | ICD-10-CM

## 2017-02-21 DIAGNOSIS — I10 Essential (primary) hypertension: Secondary | ICD-10-CM | POA: Diagnosis not present

## 2017-02-21 LAB — POCT INR: INR: 2

## 2017-02-21 NOTE — Progress Notes (Signed)
DISCONTINUE ON PATHWAY REGIMEN - Non-Small Cell Lung     Administer weekly:     Paclitaxel        Dose Mod: None     Carboplatin        Dose Mod: None  **Always confirm dose/schedule in your pharmacy ordering system**    REASON: Other Reason PRIOR TREATMENT: [id number]: Carboplatin AUC=2 + Paclitaxel 45 mg/m2 Weekly During Radiation TREATMENT RESPONSE: Partial Response (PR)  START ON PATHWAY REGIMEN - Non-Small Cell Lung     A cycle is every 21 days:     Carboplatin      Gemcitabine   **Always confirm dose/schedule in your pharmacy ordering system**    Patient Characteristics: Stage IV Metastatic, Squamous, PS = 0, 1, First Line, PD-L1 Expression Positive 1-49% (TPS) / Negative / Not Tested / Not a Candidate for Immunotherapy/ AJCC T Category: T2a Current Disease Status: Distant Metastases AJCC N Category: N1 AJCC M Category: M1c AJCC 8 Stage Grouping: IVB Histology: Squamous Cell Line of therapy: First Line PD-L1 Expression Status: PD-L1 Negative Performance Status: PS = 0, 1 Would you be surprised if this patient died  in the next year? I would NOT be surprised if this patient died in the next year  Intent of Therapy: Non-Curative / Palliative Intent, Discussed with Patient

## 2017-02-21 NOTE — Telephone Encounter (Signed)
Scheduled appt per 5/15 los. Gave patient AVS and calender per 5/15 los.

## 2017-02-21 NOTE — Progress Notes (Signed)
[place] [phone]   [phone]  OFFICE PROGRESS NOTE  [name], PA-C [place] [address]  DIAGNOSIS: Metastatic non-small cell lung cancer initially diagnosed as Stage IIB (T2a, N1, M0) non-small cell lung cancer, squamous cell carcinoma presented with left hilar mass diagnosed in August 2017.  PRIOR THERAPY: Concurrent chemoradiation with weekly carboplatin for AUC of 2 and paclitaxel 45 MG/M2 started 07/05/2016, status post 5 cycles. Last cycle was given 08/01/2016.  CURRENT THERAPY: Systemic chemotherapy with carboplatin for AUC of 5 on day 1 and gemcitabine 1000 MG/M2 on days 1 and 8 every 3 weeks.  INTERVAL HISTORY: Renee Vance 76 y.o. female returns to the clinic today for follow-up visit accompanied by her husband. The patient is feeling fine today with no specific complaints except for the baseline shortness of breath and she is currently on home oxygen. The patient denied having any chest pain, cough or hemoptysis. She denied having any recent weight loss or night sweats. She has no nausea, vomiting, diarrhea or constipation. She has no fever or chills. She had CT-guided core biopsy of the suspicious left adrenal gland nodule and the final pathology was consistent with metastatic squamous cell carcinoma. She is here today for evaluation and discussion of her treatment option based on the recent biopsy results.   MEDICAL HISTORY: Past Medical History:  Diagnosis Date  . 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 Bradycardia   . CAD (coronary artery disease)   . COPD (chronic obstructive pulmonary disease) ([place])   . Coronary atherosclerosis of native coronary artery 2011  . Encounter for antineoplastic chemotherapy 06/23/2016  . Fibrocystic breast   . History of radiation therapy 07/05/16 - 08/09/16    Left Lung: 45 Gy in 25 fractions  . HTN (hypertension)   . Hyperlipidemia   .  Hypothyroidism   . Obesity   . Pain in joint   . Right renal mass 11/30/2016  . 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  MEDICATIONS:  Current Outpatient Prescriptions  Medication Sig Dispense Refill  . amLODipine (NORVASC) 5 MG tablet Take 5 mg by mouth daily.    Marland Kitchen aspirin 81 MG tablet Take 81 mg by mouth daily.    Marland Kitchen atorvastatin (LIPITOR) 80 MG tablet Take 80 mg by mouth daily.    . budesonide (PULMICORT) 0.5 MG/2ML nebulizer solution Take 2 mLs (0.5 mg total) by nebulization 2 (two) times daily. 2 mL 60  . dextromethorphan (DELSYM) 30 MG/5ML liquid Take 60 mg by mouth 2 (two) times daily as needed.     . fluticasone furoate-vilanterol (BREO ELLIPTA) 100-25 MCG/INH AEPB Inhale 1 puff into the lungs daily. 1 each 0  . furosemide (LASIX) 20 MG tablet Take 20 mg by mouth every other day.    . ipratropium-albuterol (DUONEB) 0.5-2.5 (3) MG/3ML SOLN Take 3 mLs by nebulization every 6 (six) hours as needed. 360 mL 0  . levothyroxine (SYNTHROID, LEVOTHROID) 125 MCG tablet Take 125 mcg by mouth daily before breakfast.     . metoprolol succinate (TOPROL-XL) 50 MG 24 hr tablet Take 1 tablet (50 mg total) by mouth 2 (two) times daily. 180 tablet 1  . Multiple Vitamins-Minerals (MULTIVITAMIN WITH MINERALS) tablet Take 1 tablet by mouth daily.    Marland Kitchen omeprazole (PRILOSEC) 20 MG capsule Take 1 capsule (20 mg total) by mouth daily. 30 capsule  2  . OXYGEN Inhale 2 L into the lungs continuous.    . Vitamin D, Cholecalciferol, 1000 UNITS CAPS Take 1,000 Units by mouth daily.     Marland Kitchen warfarin (COUMADIN) 5 MG tablet Take 1/2 tablet daily except 1 tablet on Sundays and Thursdays (Patient taking differently: Take 2.'5mg'$  Sunday, Tuesday, Thursday. Take '5mg'$  by mouth on Monday, Wednesday, Friday, Saturday) 90 tablet 3   No current facility-administered medications for this visit.     SURGICAL HISTORY:  Past Surgical History:  Procedure  Laterality Date  . CHOLECYSTECTOMY    . CORONARY ANGIOPLASTY WITH STENT PLACEMENT  2011   [place] 30-40%, LAD 65-75% (FFR 0.88), CFX 55-60%, RCA 95%>0 w/ 2.5 x 12 mm monorail stent  . TUBAL LIGATION    . VIDEO BRONCHOSCOPY Bilateral 06/07/2016   Procedure: VIDEO BRONCHOSCOPY WITH FLUORO;  Surgeon: [name], MD;  Location: [facility];  Service: Cardiopulmonary;  Laterality: Bilateral;    REVIEW OF SYSTEMS:  Constitutional: positive for fatigue Eyes: negative Ears, nose, mouth, throat, and face: negative Respiratory: positive for dyspnea on exertion Cardiovascular: negative Gastrointestinal: negative Genitourinary:negative Integument/breast: negative Hematologic/lymphatic: negative Musculoskeletal:negative Neurological: negative Behavioral/Psych: negative Endocrine: negative Allergic/Immunologic: negative   PHYSICAL EXAMINATION: General appearance: alert, cooperative, fatigued and no distress Head: Normocephalic, without obvious abnormality, atraumatic Neck: no adenopathy, no JVD, supple, symmetrical, trachea midline and thyroid not enlarged, symmetric, no tenderness/mass/nodules Lymph nodes: Cervical, supraclavicular, and axillary nodes normal. Resp: rhonchi bilaterally and wheezes bilaterally Back: symmetric, no curvature. ROM normal. No CVA tenderness. Cardio: regular rate and rhythm, S1, S2 normal, no murmur, click, rub or gallop GI: soft, non-tender; bowel sounds normal; no masses,  no organomegaly Extremities: extremities normal, atraumatic, no cyanosis or edema Neurologic: Alert and oriented X 3, normal strength and tone. Normal symmetric reflexes. Normal coordination and gait  ECOG PERFORMANCE STATUS: 1 - Symptomatic but completely ambulatory  Blood pressure (!) 139/97, pulse 70, temperature 97.9 F (36.6 C), temperature source Oral, resp. rate 18, height '5\' 4"'$  (1.626 m), weight 146 lb (66.2 kg), SpO2 98 %.  LABORATORY DATA: Lab Results  Component Value Date   WBC  6.2 2017-03-09   HGB 9.1 (L) 2017/03/09   HCT 28.8 (L) 03-09-17   MCV 94.1 2017-03-09   PLT 226 03/09/17      Chemistry      Component Value Date/Time   NA 141 03/09/2017 0714   NA 141 12/26/2016 1132   K 4.3 09-Mar-2017 0714   K 4.4 12/26/2016 1132   CL 106 03-09-17 0714   CO2 27 03-09-2017 0714   CO2 27 12/26/2016 1132   BUN 32 (H) 09-Mar-2017 0714   BUN 28.2 (H) 12/26/2016 1132   CREATININE 1.82 (H) 03/09/2017 0714   CREATININE 1.7 (H) 12/26/2016 1132      Component Value Date/Time   CALCIUM 9.3 Mar 09, 2017 0714   CALCIUM 9.7 12/26/2016 1132   ALKPHOS 81 12/26/2016 1132   AST 22 12/26/2016 1132   ALT 17 12/26/2016 1132   BILITOT 0.48 12/26/2016 1132       RADIOGRAPHIC STUDIES: Ct Biopsy  Result Date: 03-09-17 INDICATION: History of lung cancer, now with indeterminate hypermetabolic left adrenal gland nodule. EXAM: CT GUIDED BIOPSY OF INDETERMINATE LEFT ADRENAL GLAND NODULE COMPARISON:  PET-CT - 01/18/2017; CT abdomen pelvis - 01/02/2017 MEDICATIONS: None. ANESTHESIA/SEDATION: Fentanyl 50 mcg IV; Versed 1 mg IV Sedation time: 20 minutes; The patient was continuously monitored during the procedure by the interventional radiology nurse under my direct supervision. CONTRAST:  None. COMPLICATIONS:  None immediate. PROCEDURE: Informed consent was obtained from the patient following an explanation of the procedure, risks, benefits and alternatives. A time out was performed prior to the initiation of the procedure. The patient was positioned prone on the CT table and a limited CT was performed for procedural planning demonstrating unchanged size and appearance of the approximately 1.2 x 0.9 cm left adrenal gland nodule (image 16, series 5). The procedure was planned. The operative site was prepped and draped in the usual sterile fashion. Appropriate trajectory was confirmed with a 22 gauge spinal needle after the adjacent tissues were anesthetized with 1% Lidocaine with epinephrine.  Under intermittent CT guidance, a 17 gauge coaxial needle was advanced into the peripheral aspect of the mass. Appropriate positioning was confirmed and 4 core needle biopsy samples were obtained with an 18 gauge core needle biopsy device. The co-axial needle was removed and hemostasis was achieved with manual compression. A limited postprocedural CT was negative for hemorrhage or additional complication. A dressing was placed. The patient tolerated the procedure well without immediate postprocedural complication. IMPRESSION: Technically successful CT guided core needle biopsy of indeterminate left adrenal gland nodule. Electronically Signed   By: Sandi Mariscal M.D.   On: 02/08/2017 12:10    ASSESSMENT AND PLAN:  This is a very pleasant 76 years old white female with metastatic non-small cell lung cancer that was initially diagnosed as unresectable stage IIb squamous cell carcinoma. She underwent a course of concurrent chemoradiation and unfortunately the recent biopsy of the left adrenal gland nodule was consistent with metastatic squamous cell carcinoma.Marland Kitchen PDL 1 expression is 0%. I had a lengthy discussion with the patient and her husband about her biopsy results and treatment options as well as goals of care. I explained to the patient that she has incurable condition and all the treatment will be of palliative nature. I gave the patient the option of palliative care and hospice referral versus consideration of palliative systemic chemotherapy with carboplatin for AUC of 5 on day 1 and gemcitabine 1000 MG/M2 on days 1 and 8 every 3 weeks. The patient is interested in proceeding with systemic chemotherapy. I discussed with her the adverse effect of this treatment including but not limited to alopecia, myelosuppression, nausea and vomiting, peripheral neuropathy, liver or renal dysfunction. She is expected to start the first dose of her treatment next week. For hypertension the patient will continue her  current treatment with Norvasc and Toprol-XL. For COPD she is currently on DuoNeb and Pulmicort. I will see her back for follow-up visit in 4 weeks with the start of cycle #2. The patient was advised to call immediately she has any concerning symptoms in the interval. The patient voices understanding of current disease status and treatment options and is in agreement with the current care plan. All questions were answered. The patient knows to call the clinic with any problems, questions or concerns. We can certainly see the patient much sooner if necessary.  Disclaimer: This note was dictated with voice recognition software. Similar sounding words can inadvertently be transcribed and may not be corrected upon review.

## 2017-02-23 ENCOUNTER — Telehealth: Payer: Self-pay | Admitting: Pulmonary Disease

## 2017-02-23 DIAGNOSIS — R0600 Dyspnea, unspecified: Secondary | ICD-10-CM

## 2017-02-23 DIAGNOSIS — J42 Unspecified chronic bronchitis: Secondary | ICD-10-CM

## 2017-02-23 NOTE — Telephone Encounter (Signed)
Spoke with pt, who is requesting an order to be sent to [facility] for a POC.  RA please advise. Thanks.

## 2017-02-24 NOTE — Telephone Encounter (Signed)
OK to order POC

## 2017-02-24 NOTE — Telephone Encounter (Signed)
Order has been placed for pt to be evaluated by [facility] for POC.  Nothing further is needed.

## 2017-03-01 ENCOUNTER — Other Ambulatory Visit (HOSPITAL_BASED_OUTPATIENT_CLINIC_OR_DEPARTMENT_OTHER): Payer: PPO

## 2017-03-01 ENCOUNTER — Ambulatory Visit (HOSPITAL_BASED_OUTPATIENT_CLINIC_OR_DEPARTMENT_OTHER): Payer: PPO

## 2017-03-01 VITALS — BP 139/80 | HR 61 | Temp 98.7°F | Resp 17

## 2017-03-01 DIAGNOSIS — C3412 Malignant neoplasm of upper lobe, left bronchus or lung: Secondary | ICD-10-CM

## 2017-03-01 DIAGNOSIS — C3492 Malignant neoplasm of unspecified part of left bronchus or lung: Secondary | ICD-10-CM

## 2017-03-01 DIAGNOSIS — C7972 Secondary malignant neoplasm of left adrenal gland: Secondary | ICD-10-CM

## 2017-03-01 DIAGNOSIS — Z5111 Encounter for antineoplastic chemotherapy: Secondary | ICD-10-CM | POA: Diagnosis not present

## 2017-03-01 LAB — CBC WITH DIFFERENTIAL/PLATELET
BASO%: 0.2 % (ref 0.0–2.0)
Basophils Absolute: 0 10*3/uL (ref 0.0–0.1)
EOS%: 4.5 % (ref 0.0–7.0)
Eosinophils Absolute: 0.3 10*3/uL (ref 0.0–0.5)
HEMATOCRIT: 30.9 % — AB (ref 34.8–46.6)
HGB: 9.9 g/dL — ABNORMAL LOW (ref 11.6–15.9)
LYMPH#: 0.7 10*3/uL — AB (ref 0.9–3.3)
LYMPH%: 12.2 % — ABNORMAL LOW (ref 14.0–49.7)
MCH: 29.7 pg (ref 25.1–34.0)
MCHC: 32 g/dL (ref 31.5–36.0)
MCV: 92.8 fL (ref 79.5–101.0)
MONO#: 0.6 10*3/uL (ref 0.1–0.9)
MONO%: 11.3 % (ref 0.0–14.0)
NEUT#: 4 10*3/uL (ref 1.5–6.5)
NEUT%: 71.8 % (ref 38.4–76.8)
Platelets: 196 10*3/uL (ref 145–400)
RBC: 3.33 10*6/uL — ABNORMAL LOW (ref 3.70–5.45)
RDW: 14.1 % (ref 11.2–14.5)
WBC: 5.5 10*3/uL (ref 3.9–10.3)

## 2017-03-01 LAB — COMPREHENSIVE METABOLIC PANEL
ALT: 20 U/L (ref 0–55)
ANION GAP: 10 meq/L (ref 3–11)
AST: 26 U/L (ref 5–34)
Albumin: 3.4 g/dL — ABNORMAL LOW (ref 3.5–5.0)
Alkaline Phosphatase: 82 U/L (ref 40–150)
BILIRUBIN TOTAL: 0.41 mg/dL (ref 0.20–1.20)
BUN: 34.4 mg/dL — ABNORMAL HIGH (ref 7.0–26.0)
CALCIUM: 9.8 mg/dL (ref 8.4–10.4)
CHLORIDE: 103 meq/L (ref 98–109)
CO2: 28 mEq/L (ref 22–29)
CREATININE: 1.7 mg/dL — AB (ref 0.6–1.1)
EGFR: 28 mL/min/{1.73_m2} — ABNORMAL LOW (ref >90)
Glucose: 105 mg/dl (ref 70–140)
Potassium: 4.1 mEq/L (ref 3.5–5.1)
Sodium: 141 mEq/L (ref 136–145)
Total Protein: 7.5 g/dL (ref 6.4–8.3)

## 2017-03-01 MED ORDER — PALONOSETRON HCL INJECTION 0.25 MG/5ML
INTRAVENOUS | Status: AC
Start: 2017-03-01 — End: 2017-03-01
  Filled 2017-03-01: qty 5

## 2017-03-01 MED ORDER — DEXAMETHASONE SODIUM PHOSPHATE 10 MG/ML IJ SOLN
INTRAMUSCULAR | Status: AC
  Filled 2017-03-01: qty 1

## 2017-03-01 MED ORDER — GEMCITABINE HCL CHEMO INJECTION 1 GM/26.3ML
1000.0000 mg/m2 | Freq: Once | INTRAVENOUS | Status: AC
  Administered 2017-03-01: 1748 mg via INTRAVENOUS
  Filled 2017-03-01: qty 46

## 2017-03-01 MED ORDER — PALONOSETRON HCL INJECTION 0.25 MG/5ML
0.2500 mg | Freq: Once | INTRAVENOUS | Status: AC
  Administered 2017-03-01: 0.25 mg via INTRAVENOUS

## 2017-03-01 MED ORDER — SODIUM CHLORIDE 0.9 % IV SOLN
260.0000 mg | Freq: Once | INTRAVENOUS | Status: AC
  Administered 2017-03-01: 260 mg via INTRAVENOUS
  Filled 2017-03-01: qty 26

## 2017-03-01 MED ORDER — SODIUM CHLORIDE 0.9 % IV SOLN
Freq: Once | INTRAVENOUS | Status: AC
  Administered 2017-03-01: 14:00:00 via INTRAVENOUS

## 2017-03-01 MED ORDER — DEXAMETHASONE SODIUM PHOSPHATE 10 MG/ML IJ SOLN
10.0000 mg | Freq: Once | INTRAMUSCULAR | Status: AC
  Administered 2017-03-01: 10 mg via INTRAVENOUS

## 2017-03-01 NOTE — Progress Notes (Signed)
Ok to give today's treatment with CRT of 1.71

## 2017-03-01 NOTE — Patient Instructions (Signed)
[facility] Discharge Instructions for Patients Receiving Chemotherapy  Today you received the following chemotherapy agents:  Carboplatin, Gemzar  To help prevent nausea and vomiting after your treatment, we encourage you to take your nausea medication as prescribed.   If you develop nausea and vomiting that is not controlled by your nausea medication, call the clinic.   BELOW ARE SYMPTOMS THAT SHOULD BE REPORTED IMMEDIATELY:  *FEVER GREATER THAN 100.5 F  *CHILLS WITH OR WITHOUT FEVER  NAUSEA AND VOMITING THAT IS NOT CONTROLLED WITH YOUR NAUSEA MEDICATION  *UNUSUAL SHORTNESS OF BREATH  *UNUSUAL BRUISING OR BLEEDING  TENDERNESS IN MOUTH AND THROAT WITH OR WITHOUT PRESENCE OF ULCERS  *URINARY PROBLEMS  *BOWEL PROBLEMS  UNUSUAL RASH Items with * indicate a potential emergency and should be followed up as soon as possible.  Feel free to call the clinic you have any questions or concerns. The clinic phone number is [phone].  Please show the CHEMO ALERT CARD at check-in to the Emergency Department and triage nurse.   

## 2017-03-02 ENCOUNTER — Telehealth: Payer: Self-pay | Admitting: Medical Oncology

## 2017-03-02 ENCOUNTER — Other Ambulatory Visit: Payer: Self-pay | Admitting: Physician Assistant

## 2017-03-02 DIAGNOSIS — Z1231 Encounter for screening mammogram for malignant neoplasm of breast: Secondary | ICD-10-CM

## 2017-03-02 NOTE — Telephone Encounter (Signed)
Returned pt call. Renee Vance asking if she can take compazine from when she had another chemo. I told her yes. She does not have nausea she just wants to know if she can take it if she needs it.

## 2017-03-03 ENCOUNTER — Observation Stay (HOSPITAL_COMMUNITY)
Admission: EM | Admit: 2017-03-03 | Discharge: 2017-03-04 | Disposition: A | Discharge status: Home / Self Care | Payer: PPO | Attending: Emergency Medicine | Admitting: Emergency Medicine

## 2017-03-03 ENCOUNTER — Encounter (HOSPITAL_COMMUNITY): Payer: Self-pay | Admitting: Emergency Medicine

## 2017-03-03 ENCOUNTER — Telehealth: Payer: Self-pay | Admitting: Pulmonary Disease

## 2017-03-03 ENCOUNTER — Emergency Department (HOSPITAL_COMMUNITY): Payer: PPO

## 2017-03-03 ENCOUNTER — Other Ambulatory Visit: Payer: Self-pay

## 2017-03-03 DIAGNOSIS — J42 Unspecified chronic bronchitis: Secondary | ICD-10-CM

## 2017-03-03 DIAGNOSIS — R748 Abnormal levels of other serum enzymes: Secondary | ICD-10-CM | POA: Insufficient documentation

## 2017-03-03 DIAGNOSIS — I129 Hypertensive chronic kidney disease with stage 1 through stage 4 chronic kidney disease, or unspecified chronic kidney disease: Secondary | ICD-10-CM | POA: Insufficient documentation

## 2017-03-03 DIAGNOSIS — Z85118 Personal history of other malignant neoplasm of bronchus and lung: Secondary | ICD-10-CM | POA: Insufficient documentation

## 2017-03-03 DIAGNOSIS — J449 Chronic obstructive pulmonary disease, unspecified: Secondary | ICD-10-CM | POA: Diagnosis present

## 2017-03-03 DIAGNOSIS — R7989 Other specified abnormal findings of blood chemistry: Secondary | ICD-10-CM | POA: Diagnosis not present

## 2017-03-03 DIAGNOSIS — J9611 Chronic respiratory failure with hypoxia: Secondary | ICD-10-CM | POA: Diagnosis not present

## 2017-03-03 DIAGNOSIS — Z7901 Long term (current) use of anticoagulants: Secondary | ICD-10-CM | POA: Insufficient documentation

## 2017-03-03 DIAGNOSIS — R778 Other specified abnormalities of plasma proteins: Secondary | ICD-10-CM

## 2017-03-03 DIAGNOSIS — D6481 Anemia due to antineoplastic chemotherapy: Secondary | ICD-10-CM | POA: Diagnosis present

## 2017-03-03 DIAGNOSIS — E039 Hypothyroidism, unspecified: Secondary | ICD-10-CM | POA: Insufficient documentation

## 2017-03-03 DIAGNOSIS — J918 Pleural effusion in other conditions classified elsewhere: Secondary | ICD-10-CM | POA: Diagnosis not present

## 2017-03-03 DIAGNOSIS — T451X5A Adverse effect of antineoplastic and immunosuppressive drugs, initial encounter: Secondary | ICD-10-CM

## 2017-03-03 DIAGNOSIS — Z955 Presence of coronary angioplasty implant and graft: Secondary | ICD-10-CM | POA: Insufficient documentation

## 2017-03-03 DIAGNOSIS — I2510 Atherosclerotic heart disease of native coronary artery without angina pectoris: Secondary | ICD-10-CM | POA: Insufficient documentation

## 2017-03-03 DIAGNOSIS — I480 Paroxysmal atrial fibrillation: Secondary | ICD-10-CM

## 2017-03-03 DIAGNOSIS — N184 Chronic kidney disease, stage 4 (severe): Secondary | ICD-10-CM | POA: Diagnosis not present

## 2017-03-03 DIAGNOSIS — N183 Chronic kidney disease, stage 3 (moderate): Secondary | ICD-10-CM | POA: Diagnosis not present

## 2017-03-03 DIAGNOSIS — Z79899 Other long term (current) drug therapy: Secondary | ICD-10-CM | POA: Insufficient documentation

## 2017-03-03 DIAGNOSIS — R0602 Shortness of breath: Secondary | ICD-10-CM | POA: Diagnosis not present

## 2017-03-03 DIAGNOSIS — Z87891 Personal history of nicotine dependence: Secondary | ICD-10-CM | POA: Diagnosis not present

## 2017-03-03 DIAGNOSIS — R042 Hemoptysis: Principal | ICD-10-CM | POA: Insufficient documentation

## 2017-03-03 LAB — COMPREHENSIVE METABOLIC PANEL
ALT: 24 U/L (ref 14–54)
AST: 33 U/L (ref 15–41)
Albumin: 3.5 g/dL (ref 3.5–5.0)
Alkaline Phosphatase: 61 U/L (ref 38–126)
Anion gap: 9 (ref 5–15)
BUN: 45 mg/dL — AB (ref 6–20)
CHLORIDE: 103 mmol/L (ref 101–111)
CO2: 27 mmol/L (ref 22–32)
CREATININE: 1.9 mg/dL — AB (ref 0.44–1.00)
Calcium: 9.1 mg/dL (ref 8.9–10.3)
GFR calc Af Amer: 28 mL/min — ABNORMAL LOW (ref >60)
GFR, EST NON AFRICAN AMERICAN: 24 mL/min — AB (ref >60)
GLUCOSE: 95 mg/dL (ref 65–99)
Potassium: 4.1 mmol/L (ref 3.5–5.1)
SODIUM: 139 mmol/L (ref 135–145)
Total Bilirubin: 0.4 mg/dL (ref 0.3–1.2)
Total Protein: 7.1 g/dL (ref 6.5–8.1)

## 2017-03-03 LAB — I-STAT TROPONIN, ED: Troponin i, poc: 0.14 ng/mL (ref 0.00–0.08)

## 2017-03-03 LAB — CBC
HCT: 27.4 % — ABNORMAL LOW (ref 36.0–46.0)
Hemoglobin: 8.9 g/dL — ABNORMAL LOW (ref 12.0–15.0)
MCH: 30.3 pg (ref 26.0–34.0)
MCHC: 32.5 g/dL (ref 30.0–36.0)
MCV: 93.2 fL (ref 78.0–100.0)
PLATELETS: 210 10*3/uL (ref 150–400)
RBC: 2.94 MIL/uL — AB (ref 3.87–5.11)
RDW: 14.4 % (ref 11.5–15.5)
WBC: 9.3 10*3/uL (ref 4.0–10.5)

## 2017-03-03 LAB — HEMOGLOBIN AND HEMATOCRIT, BLOOD
HCT: 27.7 % — ABNORMAL LOW (ref 36.0–46.0)
Hemoglobin: 9 g/dL — ABNORMAL LOW (ref 12.0–15.0)

## 2017-03-03 LAB — TROPONIN I
TROPONIN I: 0.13 ng/mL — AB (ref <0.03)
Troponin I: 0.11 ng/mL (ref <0.03)

## 2017-03-03 LAB — PROTIME-INR
INR: 2.39
Prothrombin Time: 26.5 seconds — ABNORMAL HIGH (ref 11.4–15.2)

## 2017-03-03 MED ORDER — SODIUM CHLORIDE 0.9% FLUSH: 3.0000 mL | Freq: Two times a day (BID) | INTRAVENOUS | Status: DC

## 2017-03-03 MED ORDER — WARFARIN - PHARMACIST DOSING INPATIENT: Freq: Every day | Status: DC

## 2017-03-03 MED ORDER — AMLODIPINE BESYLATE 5 MG PO TABS
5.0000 mg | ORAL_TABLET | Freq: Every day | ORAL | Status: DC
  Administered 2017-03-04: 5 mg via ORAL
  Filled 2017-03-03: qty 1

## 2017-03-03 MED ORDER — ONDANSETRON HCL 4 MG PO TABS: 4.0000 mg | ORAL_TABLET | Freq: Four times a day (QID) | ORAL | Status: DC | PRN

## 2017-03-03 MED ORDER — SODIUM CHLORIDE 0.9 % IV SOLN
Freq: Once | INTRAVENOUS | Status: AC
  Administered 2017-03-04: 02:00:00 via INTRAVENOUS

## 2017-03-03 MED ORDER — ONDANSETRON HCL 4 MG/2ML IJ SOLN: 4.0000 mg | Freq: Four times a day (QID) | INTRAMUSCULAR | Status: DC | PRN

## 2017-03-03 MED ORDER — ACETAMINOPHEN 325 MG PO TABS: 650.0000 mg | ORAL_TABLET | Freq: Four times a day (QID) | ORAL | Status: DC | PRN

## 2017-03-03 MED ORDER — ACETAMINOPHEN 650 MG RE SUPP: 650.0000 mg | Freq: Four times a day (QID) | RECTAL | Status: DC | PRN

## 2017-03-03 MED ORDER — DEXTROMETHORPHAN POLISTIREX ER 30 MG/5ML PO SUER
60.0000 mg | Freq: Two times a day (BID) | ORAL | Status: DC | PRN
  Filled 2017-03-03: qty 10

## 2017-03-03 MED ORDER — METHYLPREDNISOLONE SODIUM SUCC 125 MG IJ SOLR
125.0000 mg | INTRAMUSCULAR | Status: AC
  Administered 2017-03-04: 125 mg via INTRAVENOUS
  Filled 2017-03-03: qty 2

## 2017-03-03 MED ORDER — WARFARIN SODIUM 2.5 MG PO TABS
2.5000 mg | ORAL_TABLET | ORAL | Status: AC
  Administered 2017-03-04: 2.5 mg via ORAL
  Filled 2017-03-03 (×2): qty 1

## 2017-03-03 MED ORDER — ARFORMOTEROL TARTRATE 15 MCG/2ML IN NEBU
15.0000 ug | INHALATION_SOLUTION | Freq: Two times a day (BID) | RESPIRATORY_TRACT | Status: DC
  Filled 2017-03-03 (×2): qty 2

## 2017-03-03 MED ORDER — PANTOPRAZOLE SODIUM 40 MG PO TBEC
40.0000 mg | DELAYED_RELEASE_TABLET | Freq: Every day | ORAL | Status: DC
  Administered 2017-03-04: 40 mg via ORAL
  Filled 2017-03-03: qty 1

## 2017-03-03 MED ORDER — BUDESONIDE 0.5 MG/2ML IN SUSP
RESPIRATORY_TRACT | Status: AC
  Filled 2017-03-03: qty 2

## 2017-03-03 MED ORDER — METOPROLOL SUCCINATE ER 50 MG PO TB24
50.0000 mg | ORAL_TABLET | Freq: Two times a day (BID) | ORAL | Status: DC
  Administered 2017-03-04 (×2): 50 mg via ORAL
  Filled 2017-03-03 (×4): qty 1

## 2017-03-03 MED ORDER — IPRATROPIUM-ALBUTEROL 0.5-2.5 (3) MG/3ML IN SOLN
3.0000 mL | RESPIRATORY_TRACT | Status: AC
  Administered 2017-03-03: 3 mL via RESPIRATORY_TRACT
  Filled 2017-03-03: qty 3

## 2017-03-03 MED ORDER — BUDESONIDE 0.5 MG/2ML IN SUSP
0.5000 mg | Freq: Two times a day (BID) | RESPIRATORY_TRACT | Status: DC
  Administered 2017-03-03 – 2017-03-04 (×2): 0.5 mg via RESPIRATORY_TRACT
  Filled 2017-03-03: qty 2

## 2017-03-03 MED ORDER — ALBUTEROL SULFATE (2.5 MG/3ML) 0.083% IN NEBU
5.0000 mg | INHALATION_SOLUTION | Freq: Once | RESPIRATORY_TRACT | Status: DC
  Filled 2017-03-03: qty 6

## 2017-03-03 MED ORDER — IPRATROPIUM-ALBUTEROL 0.5-2.5 (3) MG/3ML IN SOLN
3.0000 mL | RESPIRATORY_TRACT | Status: DC | PRN
  Administered 2017-03-04: 3 mL via RESPIRATORY_TRACT
  Filled 2017-03-03: qty 3

## 2017-03-03 MED ORDER — LEVOTHYROXINE SODIUM 25 MCG PO TABS
125.0000 ug | ORAL_TABLET | Freq: Every day | ORAL | Status: DC
  Administered 2017-03-04: 10:00:00 125 ug via ORAL
  Filled 2017-03-03 (×2): qty 1

## 2017-03-03 NOTE — Progress Notes (Signed)
ANTICOAGULATION CONSULT NOTE - Initial Consult  Pharmacy Consult for Warfarin Indication: atrial fibrillation  Allergies  Allergen Reactions  . Ampicillin Nausea And Vomiting    Has patient had a PCN reaction causing immediate rash, facial/tongue/throat swelling, SOB or lightheadedness with hypotension: no Has patient had a PCN reaction causing severe rash involving mucus membranes or skin necrosis: no Has patient had a PCN reaction that required hospitalization: no Has patient had a PCN reaction occurring within the last 10 years: no If all of the above answers are "NO", then may proceed with Cephalosporin use  . Codeine Nausea And Vomiting    Patient Measurements: Height: 5\' 4"  (162.6 cm) Weight: 146 lb (66.2 kg) IBW/kg (Calculated) : 54.7 Heparin Dosing Weight:   Vital Signs: Temp: 99.1 F (37.3 C) (05/25 1343) Temp Source: Oral (05/25 1343) BP: 121/57 (05/25 2230) Pulse Rate: 68 (05/25 2230)  Labs:  Recent Labs  03/01/17 1309 03/01/17 1309 03/03/17 1547 03/03/17 1752 03/03/17 2106  HGB 9.9*  --  8.9*  --  9.0*  HCT 30.9*  --  27.4*  --  27.7*  PLT 196  --  210  --   --   LABPROT  --   --  26.5*  --   --   INR  --   --  2.39  --   --   CREATININE  --  1.7* 1.90*  --   --   TROPONINI  --   --   --  0.13*  --     Estimated Creatinine Clearance: 23.2 mL/min (A) (by C-G formula based on SCr of 1.9 mg/dL (H)).   Medical History: Past Medical History:  Diagnosis Date  . 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 Bradycardia   . CAD (coronary artery disease)   . COPD (chronic obstructive pulmonary disease) ([place])   . Coronary atherosclerosis of native coronary artery 2011  . Encounter for antineoplastic chemotherapy 06/23/2016  . Fibrocystic breast   . History of radiation therapy 07/05/16 - 08/09/16    Left Lung: 45 Gy in 25 fractions  . HTN (hypertension)   . Hyperlipidemia   . Hypothyroidism   . Obesity   . Pain in joint   . Right renal  mass 11/30/2016  . 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 [START ON 03/04/2017] amLODipine  5 mg Oral Daily  . arformoterol  15 mcg Nebulization BID  . budesonide      . budesonide  0.5 mg Nebulization BID  . [START ON 03/04/2017] levothyroxine  125 mcg Oral QAC breakfast  . methylPREDNISolone (SOLU-MEDROL) injection  125 mg Intravenous STAT  . metoprolol succinate  50 mg Oral BID  . [START ON 03/04/2017] pantoprazole  40 mg Oral Daily  . sodium chloride flush  3 mL Intravenous Q12H    Assessment: Patient with afib and chronic warfarin use.  INR at goal on admit.  Patient in ED with hemoptysis, but now "No gross hemoptysis or epistaxis" per ED note.  MD order warfarin per pharmacy protocol.  Last warfarin dose noted 5/24 PM.  Goal of Therapy:  INR 2-3 Monitor platelets by anticoagulation protocol: Yes   Plan:  Warfarin 2.5mg  po x1 Daily INR  Renee Vance, Renee Vance 03/03/2017,10:55 PM

## 2017-03-03 NOTE — Telephone Encounter (Signed)
Called and spoke with pt and she is aware of MW recs and will go to the ER>

## 2017-03-03 NOTE — H&P (Addendum)
History and Physical    DRUANNE BOSQUES SHF:[id number] DOB: [birth year] DOA: 03/03/2017  Referring MD/NP/PA: Milana Obey PCP: [name], PA-C  Patient coming from: Home  Chief Complaint: Coughing up blood  HPI: Renee Vance is a 76 y.o. 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  Every time the patient coughs or blows her nose she brings up nickel to quarter size cranberry-colored clots of blood. She has symptoms occurred at least 4-5 times. Patient notes that she had similar symptoms while on chemotherapy last year in October, but had resolved. She was just restarted on chemotherapy on 5/23, after the cancer was noted to have spread to one of her adrenal glands. Associated symptoms include increased shortness of breath, mild chills, and palpitations. Denies any leg swelling, calf pain, change in weight, fever, nausea, vomiting, or diarrhea.   Followed by Dr. Earlie Server of oncology.  ED Course: Upon admission into the emergency department patient was seen to be afebrile, pulse 62-71, respirations 16-32, and O2 saturations were maintained on room air. Labs revealed hemoglobin 8.9, BUN 45, creatinine 1.9, INR 2.39, troponin 0.14. EKG had some subtle signs of ST elevation in the inferior leads. Cardiology was notified, but recommended continued trending of cardiac enzymes.  Review of Systems: As per HPI otherwise 10 point review of systems negative.   Past Medical History:  Diagnosis Date  . 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 Bradycardia   . CAD (coronary artery disease)   . COPD (chronic obstructive pulmonary disease) ([place])   . Coronary atherosclerosis of native coronary artery 2011  . Encounter for antineoplastic chemotherapy  06/23/2016  . Fibrocystic breast   . History of radiation therapy 07/05/16 - 08/09/16    Left Lung: 45 Gy in 25 fractions  . HTN (hypertension)   . Hyperlipidemia   . Hypothyroidism   . Obesity   . Pain in joint   . Right renal mass 11/30/2016  . 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 CHOLECYSTECTOMY    . CORONARY ANGIOPLASTY WITH STENT PLACEMENT  2011   [place] 30-40%, LAD 65-75% (FFR 0.88), CFX 55-60%, RCA 95%>0 w/ 2.5 x 12 mm monorail stent  . TUBAL LIGATION    . VIDEO BRONCHOSCOPY Bilateral 06/07/2016   Procedure: VIDEO BRONCHOSCOPY WITH FLUORO;  Surgeon: [name], MD;  Location: [facility];  Service: Cardiopulmonary;  Laterality: Bilateral;     reports that she quit smoking about 2 years ago. She has a 30.00 pack-year smoking history. She has never used smokeless tobacco. She reports that she does not drink alcohol or use drugs.  Allergies  Allergen Reactions  . Ampicillin Nausea And Vomiting    Has patient had a PCN reaction causing immediate rash, facial/tongue/throat swelling, SOB or lightheadedness with hypotension: no Has patient had a PCN reaction causing severe rash involving mucus membranes or skin necrosis: no Has patient had a PCN reaction that required hospitalization: no Has patient had a PCN reaction occurring within the last 10 years: no If all of the above answers are "NO", then may proceed with Cephalosporin use  . Codeine Nausea And Vomiting    Family History  Problem Relation Age of Onset  . Heart disease  Mother     Prior to Admission medications   Medication Sig Start Date End Date Taking? Authorizing Provider  amLODipine (NORVASC) 5 MG tablet Take 5 mg by mouth daily.   Yes [provider]  aspirin 81 MG tablet Take 81 mg by mouth daily.   Yes [provider]  atorvastatin (LIPITOR) 80 MG tablet Take 80 mg by mouth daily.   Yes [provider]  budesonide (PULMICORT) 0.5 MG/2ML nebulizer solution Take 2 mLs (0.5 mg total) by nebulization 2 (two) times daily. 11/04/16  Yes Mariel Aloe, MD  dextromethorphan (DELSYM) 30 MG/5ML liquid Take 60 mg by mouth 2 (two) times daily as needed.    Yes [provider]  furosemide (LASIX) 20 MG tablet Take 20 mg by mouth daily.  12/13/16  Yes [provider]  ipratropium-albuterol (DUONEB) 0.5-2.5 (3) MG/3ML SOLN Take 3 mLs by nebulization every 6 (six) hours as needed. 11/04/16  Yes Mariel Aloe, MD  levothyroxine (SYNTHROID, LEVOTHROID) 125 MCG tablet Take 125 mcg by mouth daily before breakfast.  02/26/15  Yes [provider]  metoprolol succinate (TOPROL-XL) 50 MG 24 hr tablet Take 1 tablet (50 mg total) by mouth 2 (two) times daily. 01/06/16  Yes Belva Crome, MD  Multiple Vitamins-Minerals (MULTIVITAMIN WITH MINERALS) tablet Take 1 tablet by mouth daily.   Yes [provider]  omeprazole (PRILOSEC) 20 MG capsule Take 1 capsule (20 mg total) by mouth daily. 02/13/17  Yes [name], MD  Vitamin D, Cholecalciferol, 1000 UNITS CAPS Take 1,000 Units by mouth daily.  08/25/14  Yes [provider]  warfarin (COUMADIN) 5 MG tablet Take 1/2 tablet daily except 1 tablet on Sundays and Thursdays Patient taking differently: Take 2.5-5 mg by mouth one time only at 6 PM. Take 2.5mg  MWF and take 5mg  every other day of the week 07/28/16  Yes Branch, Alphonse Guild, MD  fluticasone furoate-vilanterol (BREO ELLIPTA) 100-25 MCG/INH AEPB Inhale 1 puff into the lungs daily. Patient not taking: Reported on 03/03/2017 02/13/17   [name], MD  OXYGEN Inhale 2 L into the lungs continuous.    [provider]    Physical Exam:    Constitutional: Elderly female who appears to be in no acute distress at this time  Vitals:   03/03/17 1553 03/03/17 1700 03/03/17 1730 03/03/17 1800  BP: (!) 121/45 139/60 (!) 150/80 132/63  Pulse: 65 62 63 65  Resp: 16 (!) 22  (!) 32 19  Temp:      TempSrc:      SpO2: 99% 99% 99% 98%  Weight:      Height:       Eyes: PERRL, lids and conjunctivae normal ENMT: Mucous membranes are moist. Posterior pharynx clear of any exudate or lesions.  Neck: normal, supple, no masses, no thyromegaly Respiratory: Fine crackles appreciated throughout both lung fields with expiratory wheeze. Able to talk and nearly complete sentences. Cardiovascular: Irregularly irregular, no murmurs / rubs / gallops. No extremity edema. 2+ pedal pulses. No carotid bruits.  Abdomen: no tenderness, no masses palpated. No hepatosplenomegaly. Bowel sounds positive.  Musculoskeletal: no clubbing / cyanosis. No joint deformity upper and lower extremities. Good ROM, no contractures. Normal muscle tone.  Skin: no rashes, lesions, ulcers. No induration Neurologic: CN 2-12 grossly intact. Sensation intact, DTR normal. Strength 5/5 in all 4.  Psychiatric: Normal judgment and insight. Alert and oriented x 3. Normal mood.     Labs on Admission: I have  personally reviewed following labs and imaging studies  CBC:  Recent Labs Lab 03/01/17 1309 03/03/17 1547  WBC 5.5 9.3  NEUTROABS 4.0  --   HGB 9.9* 8.9*  HCT 30.9* 27.4*  MCV 92.8 93.2  PLT 196 657   Basic Metabolic Panel:  Recent Labs Lab 03/01/17 1309 03/03/17 1547  NA 141 139  K 4.1 4.1  CL  --  103  CO2 28 27  GLUCOSE 105 95  BUN 34.4* 45*  CREATININE 1.7* 1.90*  CALCIUM 9.8 9.1   GFR: Estimated Creatinine Clearance: 23.2 mL/min (A) (by C-G formula based on SCr of 1.9 mg/dL (H)). Liver Function Tests:  Recent Labs Lab 03/01/17 1309 03/03/17 1547  AST 26 33  ALT 20 24  ALKPHOS 82 61  BILITOT 0.41 0.4  PROT 7.5 7.1  ALBUMIN 3.4* 3.5   No results for input(s): LIPASE, AMYLASE in the last 168 hours. No results for input(s): AMMONIA in the last 168 hours. Coagulation Profile:  Recent Labs Lab 03/03/17 1547  INR 2.39   Cardiac Enzymes:  Recent Labs Lab  03/03/17 1752  TROPONINI 0.13*   BNP (last 3 results) No results for input(s): PROBNP in the last 8760 hours. HbA1C: No results for input(s): HGBA1C in the last 72 hours. CBG: No results for input(s): GLUCAP in the last 168 hours. Lipid Profile: No results for input(s): CHOL, HDL, LDLCALC, TRIG, CHOLHDL, LDLDIRECT in the last 72 hours. Thyroid Function Tests: No results for input(s): TSH, T4TOTAL, FREET4, T3FREE, THYROIDAB in the last 72 hours. Anemia Panel: No results for input(s): VITAMINB12, FOLATE, FERRITIN, TIBC, IRON, RETICCTPCT in the last 72 hours. Urine analysis:    Component Value Date/Time   COLORURINE YELLOW 10/28/2016 1649   APPEARANCEUR CLEAR 10/28/2016 1649   LABSPEC 1.004 (L) 10/28/2016 1649   PHURINE 5.0 10/28/2016 1649   GLUCOSEU NEGATIVE 10/28/2016 1649   HGBUR LARGE (A) 10/28/2016 1649   BILIRUBINUR NEGATIVE 10/28/2016 1649   KETONESUR NEGATIVE 10/28/2016 1649   PROTEINUR 30 (A) 10/28/2016 1649   NITRITE NEGATIVE 10/28/2016 1649   LEUKOCYTESUR TRACE (A) 10/28/2016 1649   Sepsis Labs: No results found for this or any previous visit (from the past 240 hour(s)).   Radiological Exams on Admission: Dg Chest 2 View  Result Date: 03/03/2017 CLINICAL DATA:  Hemoptysis and worsening shortness of breath EXAM: CHEST  2 VIEW COMPARISON:  CT chest 12/26/2016, radiograph 11/28/2016, 11/01/2016 FINDINGS: Small left pleural effusion or thickening. Diffuse fibrosis. Parenchymal pattern on the right appears unchanged radiographically. Left upper and lower lobe and perihilar interstitial and alveolar opacity probably stable. Left perihilar region bronchiectasis. Slight worsened opacification at the left base. Stable cardiomediastinal silhouette with atherosclerosis. IMPRESSION: 1. Peripheral interstitial opacities in the right lung appear grossly unchanged compared to the most recent prior radiograph. 2. Bronchiectasis, interstitial and alveolar process in the left perihilar  region also probably stable compared with prior chest CT from March 2018. 3. Slight increased density at the left lung base, cannot exclude superimposed pneumonia 4. Left pleural disease grossly stable Electronically Signed   By: Donavan Foil M.D.   On: 03/03/2017 14:18   Ct Chest Wo Contrast  Result Date: 03/03/2017 CLINICAL DATA:  History of lung carcinoma with current hemoptysis EXAM: CT CHEST WITHOUT CONTRAST TECHNIQUE: Multidetector CT imaging of the chest was performed following the standard protocol without IV contrast. COMPARISON:  01/18/2017, 12/26/2016 FINDINGS: Cardiovascular: Somewhat limited due to the lack of IV contrast. Aortic calcifications are seen without aneurysmal dilatation. Coronary calcifications are  noted. No significant cardiac enlargement is seen. Mediastinum/Nodes: The thoracic inlet is within normal limits. Scattered small lymph nodes are noted within the mediastinum. These are stable from the prior examination and were not shown to be hypermetabolic on recent PET-CT. The esophagus as visualized is within normal limits. Lungs/Pleura: Chronic fibrotic changes are again identified bilaterally. Diffuse bronchiectatic changes are seen throughout the left lung with peripheral fibrotic changes stable from the recent PET-CT. Left-sided pleural effusion is again identified and stable. Upper Abdomen: Gall bladder has been surgically removed. The left adrenal nodule seen previously is again identified. Musculoskeletal: Degenerative changes of the thoracic spine are seen. IMPRESSION: Stable left adrenal nodule. Stable chronic fibrotic changes in both lungs. Stable mediastinal lymph nodes are noted as well. Left pleural effusion. No definitive acute abnormality is seen. Electronically Signed   By: Inez Catalina M.D.   On: 03/03/2017 17:02    EKG: Independently reviewed. Sinus rhythm with subtle signs of ST elevations within the inferior leads.  Assessment/Plan Hemoptysis: Acute. Patient  reports small size clots of blood for which she is coughing up. Source thought to possibly be from sinuses vs. lung given history of malignancy - Admit to a telemetry bed - Monitor H&H - Consider need of ENT evaluation, if symptoms persist or worsen.  Elevated troponin: Acute. Troponin elevated to 0.13 on admission. Patient appears to have chronically elevated troponin upon review of records previously only up to 0.09. This could be secondary to supply demand although EKG does show some subtle ST elevations in inferior leads. Last echocardiogram showed EF of 60-65% in 10/2016 with patient having a previous cardiac catheterization back in 2011. - Trend cardiac enzymes - Consult cardiology in a.m,if needed   Antineoplastic chemotherapy-induced anemia/acute blood loss: Hemoglobin 8.9 on admission. Previously, 9.9 just 2 days ago. Question if symptoms secondary to hemoptysis and/or recent chemotherapy therapy treatment. - T&S for possible neoplastic products - Repeat CBC in a.m. - Consider need of transfusion if hemoglobin drops less than 8  Chronic respiratory failure with hypoxia, COPD exacerbation: Acute on chronic. Patient reports being at baseline on 2 L of nasal cannula oxygen 24 7. On physical exam found to have some wheezes on physical exam and complaints of shortness of breath. - Continuous pulse oximetry with nasal cannula oxygen and keep O2 sats greater than 92% - Duonebs q4 hr prn SOB - Brovana and budesonide nebs q 12hr - Give 125 mg of Solu-Medrol IV 1 dose - Reassess in a.m. consider if po steroids can be initiated  Left-sided pleural effusion: Chronic per patient. - Continue to monitor  Paroxysmal atrial fibrillation on chronic anticoagulation: Patient with therapeutic INR of 2.39. - Continue metoprolol and Coumadin  Essential hypertension - Continue amlodipine and metoprolol   NSCLC stage IIb: Cancer diagnosed back in 05/2016. Followed by Dr. Earlie Server of oncology. Patient  recently started back on chemotherapy 2 days ago after new adrenal lesion seen. - Notify Dr. Earlie Server the patient's present in the hospital in a.m.  Chronic kidney disease stage IV: The patient's baseline creatinine is anywhere from 1.7-1.8. Patient presents with creatinine of 1.9 and BUN 45. Elevated BUN to creatinine ratio would suggest signs of dehydration. - Continue to monitor   Hypothyroidism - Continue levothyroxine   GERD - Continue Protonix  DVT prophylaxis: Patient currently on Coumadin  Code Status: Full Family Communication: Discussed plan of care with the patient, present beside Disposition Plan: Likely discharge home in 1-2 days once medically stable  Consults called: Cardiology Admission  status: Observation  Norval Morton MD [facility] Pager [phone]  If 7PM-7AM, please contact night-coverage [email or web address] Password [email or web address]  03/03/2017, 8:10 PM

## 2017-03-03 NOTE — ED Triage Notes (Signed)
Pt reports coughing up blood started today, also epistaxis. Hx lung cancer. sts shortness of breath. Alert and oriented x 4. Home O2 2 L [place]

## 2017-03-03 NOTE — Telephone Encounter (Signed)
Spoke with pt, who reports of blood clots when blowing her nose & coughing up dark red blood x1d. pt states she developed increased sob yesterday. Pt started chemo on 03/01/17. Pt is unsure if symptoms are related to treatment.  MW please advise, as RA is unavailable. Thanks.   02/13/17 Finish Pulmicort and DuoNeb's After that, start taking ANORO once daily instead- sample. Call back for prescription of this works and check with her insurance for coverage  Use albuterol MDI 2 puffs as needed

## 2017-03-03 NOTE — ED Notes (Signed)
IV team at bedside 

## 2017-03-03 NOTE — ED Provider Notes (Signed)
[facility] DEPT Provider Note   CSN: [id number] Arrival date & time: 03/03/17  1330     History   Chief Complaint Chief Complaint  Patient presents with  . Hemoptysis    HPI Renee Vance is a 76 y.o. 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  She reports 3 episodes of hemoptysis and blood clots from nose. She reports associated increased from her baseline and unchanged SOB since last night. She also reports increased nonproductive cough since last night.  She reports "slight" pleuritic chest pain, however she states this is chronic and not a new or worsening symptom. She states she had a breathing tx at home which improved her symptoms. She denies any chest pain, fevers, chills, nasal congestion, n/v/d. She recently seeing her pulmonologist 2 weeks ago who stated her lungs looked good and unchanged. 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 She denies any current radiation treatment.  She reports use of coumadin. She reports 2L [place] oxygen use at home.   Per EMR: cardiac cath and stent placed in 2011.   Last echo done in January 2018 with 60-65% LV EF and good systolic LV function.   The history is provided by the patient. No language interpreter was used.    Past Medical History:  Diagnosis Date  . 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 Bradycardia   . CAD (coronary artery disease)   . COPD (chronic obstructive pulmonary disease) ([place])   . Coronary atherosclerosis of native coronary artery 2011  . Encounter for antineoplastic chemotherapy 06/23/2016  . Fibrocystic breast   . History of radiation therapy 07/05/16 - 08/09/16    Left Lung: 45 Gy in 25 fractions  . HTN (hypertension)   .  Hyperlipidemia   . Hypothyroidism   . Obesity   . Pain in joint   . Right renal mass 11/30/2016  . Stage II squamous cell carcinoma of left lung ([place]) 06/23/2016    Patient Active Problem List   Diagnosis Date Noted  . Goals of care, counseling/discussion 02/21/2017  . Right renal mass 11/30/2016  . AKI (acute kidney injury) ([place])   . Acute pulmonary edema ([facility])   . Chronic respiratory failure with hypoxia ([place]) 10/29/2016  . Acute renal failure ([place]) 10/29/2016  . Acute renal failure superimposed on stage 3 chronic kidney disease ([place]) 10/29/2016  . Coagulopathy ([place]) 10/29/2016  . Hematuria 10/29/2016  . Preoperative clearance 10/16/2016  . 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 Stage II squamous cell carcinoma of left lung ([place]) 06/23/2016  . Encounter for antineoplastic chemotherapy 06/23/2016  . Chronic anticoagulation 08/19/2015  . Encounter for therapeutic drug monitoring 05/04/2015  . PAF (paroxysmal atrial fibrillation) ([place]) 03/26/2015  . Premature atrial contractions 11/11/2014  . Hyperlipidemia   . HTN (hypertension)   . Pain in joint   . Coronary atherosclerosis of native coronary artery   . Dyspnea   . Bradycardia   . Hypothyroidism   . Obesity   . Fibrocystic breast   . COPD (chronic obstructive pulmonary disease) ([place])     Past Surgical History:  Procedure Laterality Date  . CHOLECYSTECTOMY    . CORONARY ANGIOPLASTY WITH STENT PLACEMENT  2011   [place] 30-40%, LAD 65-75% (FFR 0.88), CFX 55-60%,  RCA 95%>0 w/ 2.5 x 12 mm monorail stent  . TUBAL LIGATION    . VIDEO BRONCHOSCOPY Bilateral 06/07/2016   Procedure: VIDEO BRONCHOSCOPY WITH FLUORO;  Surgeon: [name], MD;  Location: [facility];  Service: Cardiopulmonary;  Laterality: Bilateral;    OB History    No data available       Home Medications    Prior to Admission medications   Medication Sig Start Date End Date Taking? Authorizing Provider  amLODipine (NORVASC) 5 MG  tablet Take 5 mg by mouth daily.   Yes [provider]  aspirin 81 MG tablet Take 81 mg by mouth daily.   Yes [provider]  atorvastatin (LIPITOR) 80 MG tablet Take 80 mg by mouth daily.   Yes [provider]  budesonide (PULMICORT) 0.5 MG/2ML nebulizer solution Take 2 mLs (0.5 mg total) by nebulization 2 (two) times daily. 11/04/16  Yes Mariel Aloe, MD  dextromethorphan (DELSYM) 30 MG/5ML liquid Take 60 mg by mouth 2 (two) times daily as needed.    Yes [provider]  furosemide (LASIX) 20 MG tablet Take 20 mg by mouth daily.  12/13/16  Yes [provider]  ipratropium-albuterol (DUONEB) 0.5-2.5 (3) MG/3ML SOLN Take 3 mLs by nebulization every 6 (six) hours as needed. 11/04/16  Yes Mariel Aloe, MD  levothyroxine (SYNTHROID, LEVOTHROID) 125 MCG tablet Take 125 mcg by mouth daily before breakfast.  02/26/15  Yes [provider]  metoprolol succinate (TOPROL-XL) 50 MG 24 hr tablet Take 1 tablet (50 mg total) by mouth 2 (two) times daily. 01/06/16  Yes Belva Crome, MD  Multiple Vitamins-Minerals (MULTIVITAMIN WITH MINERALS) tablet Take 1 tablet by mouth daily.   Yes [provider]  omeprazole (PRILOSEC) 20 MG capsule Take 1 capsule (20 mg total) by mouth daily. 02/13/17  Yes [name], MD  Vitamin D, Cholecalciferol, 1000 UNITS CAPS Take 1,000 Units by mouth daily.  08/25/14  Yes [provider]  warfarin (COUMADIN) 5 MG tablet Take 1/2 tablet daily except 1 tablet on Sundays and Thursdays Patient taking differently: Take 2.5-5 mg by mouth one time only at 6 PM. Take 2.5mg  MWF and take 5mg  every other day of the week 07/28/16  Yes Branch, Alphonse Guild, MD  fluticasone furoate-vilanterol (BREO ELLIPTA) 100-25 MCG/INH AEPB Inhale 1 puff into the lungs daily. Patient not taking: Reported on 03/03/2017 02/13/17   [name], MD  OXYGEN Inhale 2 L into the lungs continuous.    [provider]    Family  History Family History  Problem Relation Age of Onset  . Heart disease Mother     Social History Social History  Substance Use Topics  . Smoking status: Former Smoker    Packs/day: 0.50    Years: 60.00    Quit date: 04/10/2014  . Smokeless tobacco: Never Used     Comment: june 2015  . Alcohol use No     Allergies   Ampicillin and Codeine   Review of Systems Review of Systems  Constitutional: Negative for chills and fever.  HENT: Negative for congestion.   Respiratory: Positive for cough and shortness of breath.   Cardiovascular: Negative for chest pain.  Gastrointestinal: Negative for abdominal pain, blood in stool, diarrhea, nausea and vomiting.  Genitourinary: Negative for difficulty urinating.  Skin: Negative for wound.  All other systems reviewed and are negative.    Physical Exam Updated Vital Signs BP (!) 147/58   Pulse 71  Temp 99.1 F (37.3 C) (Oral)   Resp (!) 26   Ht 5\' 4"  (1.626 m)   Wt 66.2 kg (146 lb)   SpO2 98%   BMI 25.06 kg/m   Physical Exam  Constitutional: She appears well-developed and well-nourished. No distress.  Well appearing. Patent airway. No trismus, no drooling, no stridor. Handling secretions well.  HENT:  Head: Normocephalic and atraumatic.  Nose: Nose normal.  Mouth/Throat: Oropharynx is clear and moist.  Eyes: Conjunctivae and EOM are normal.  Neck: Normal range of motion. No JVD present.  Cardiovascular: Normal rate, normal heart sounds and intact distal pulses.   No murmur heard. Pulmonary/Chest: Effort normal. No stridor. No respiratory distress. She has wheezes. She has rales.  On 2L of O2. Normal work of breathing. No respiratory distress noted.   Abdominal: Soft. Bowel sounds are normal. There is no tenderness. There is no rebound and no guarding.  Musculoskeletal: Normal range of motion.  Neurological: She is alert.  Skin: Skin is warm. Capillary refill takes less than 2 seconds.  Psychiatric: She has a normal mood  and affect. Her behavior is normal.  Nursing note and vitals reviewed.    ED Treatments / Results  Labs (all labs ordered are listed, but only abnormal results are displayed) Labs Reviewed  CBC - Abnormal; Notable for the following:       Result Value   RBC 2.94 (*)    Hemoglobin 8.9 (*)    HCT 27.4 (*)    All other components within normal limits  COMPREHENSIVE METABOLIC PANEL - Abnormal; Notable for the following:    BUN 45 (*)    Creatinine, Ser 1.90 (*)    GFR calc non Af Amer 24 (*)    GFR calc Af Amer 28 (*)    All other components within normal limits  PROTIME-INR - Abnormal; Notable for the following:    Prothrombin Time 26.5 (*)    All other components within normal limits  TROPONIN I - Abnormal; Notable for the following:    Troponin I 0.13 (*)    All other components within normal limits  I-STAT TROPOININ, ED - Abnormal; Notable for the following:    Troponin i, poc 0.14 (*)    All other components within normal limits    EKG  EKG Interpretation  Date/Time:  Friday Mar 03 2017 13:42:09 EDT Ventricular Rate:  68 PR Interval:    QRS Duration: 84 QT Interval:  377 QTC Calculation: 401 R Axis:   97 Text Interpretation:  Sinus rhythm Right axis deviation Anteroseptal infarct, old Abnormal T, consider ischemia, lateral leads Borderline ST elevation, inferior leads No significant change since last tracing Confirmed by Blanchie Dessert [id number]) on 03/03/2017 3:44:41 PM       Radiology Dg Chest 2 View  Result Date: 03/03/2017 CLINICAL DATA:  Hemoptysis and worsening shortness of breath EXAM: CHEST  2 VIEW COMPARISON:  CT chest 12/26/2016, radiograph 11/28/2016, 11/01/2016 FINDINGS: Small left pleural effusion or thickening. Diffuse fibrosis. Parenchymal pattern on the right appears unchanged radiographically. Left upper and lower lobe and perihilar interstitial and alveolar opacity probably stable. Left perihilar region bronchiectasis. Slight worsened opacification  at the left base. Stable cardiomediastinal silhouette with atherosclerosis. IMPRESSION: 1. Peripheral interstitial opacities in the right lung appear grossly unchanged compared to the most recent prior radiograph. 2. Bronchiectasis, interstitial and alveolar process in the left perihilar region also probably stable compared with prior chest CT from March 2018. 3. Slight increased density at the left  lung base, cannot exclude superimposed pneumonia 4. Left pleural disease grossly stable Electronically Signed   By: Donavan Foil M.D.   On: 03/03/2017 14:18   Ct Chest Wo Contrast  Result Date: 03/03/2017 CLINICAL DATA:  History of lung carcinoma with current hemoptysis EXAM: CT CHEST WITHOUT CONTRAST TECHNIQUE: Multidetector CT imaging of the chest was performed following the standard protocol without IV contrast. COMPARISON:  01/18/2017, 12/26/2016 FINDINGS: Cardiovascular: Somewhat limited due to the lack of IV contrast. Aortic calcifications are seen without aneurysmal dilatation. Coronary calcifications are noted. No significant cardiac enlargement is seen. Mediastinum/Nodes: The thoracic inlet is within normal limits. Scattered small lymph nodes are noted within the mediastinum. These are stable from the prior examination and were not shown to be hypermetabolic on recent PET-CT. The esophagus as visualized is within normal limits. Lungs/Pleura: Chronic fibrotic changes are again identified bilaterally. Diffuse bronchiectatic changes are seen throughout the left lung with peripheral fibrotic changes stable from the recent PET-CT. Left-sided pleural effusion is again identified and stable. Upper Abdomen: Gall bladder has been surgically removed. The left adrenal nodule seen previously is again identified. Musculoskeletal: Degenerative changes of the thoracic spine are seen. IMPRESSION: Stable left adrenal nodule. Stable chronic fibrotic changes in both lungs. Stable mediastinal lymph nodes are noted as well.  Left pleural effusion. No definitive acute abnormality is seen. Electronically Signed   By: Inez Catalina M.D.   On: 03/03/2017 17:02    Procedures Procedures (including critical care time)  Medications Ordered in ED Medications - No data to display   Initial Impression / Assessment and Plan / ED Course  I have reviewed the triage vital signs and the nursing notes.  Pertinent labs & imaging results that were available during my care of the patient were reviewed by me and considered in my medical decision making (see chart for details).    Pt in NAD, afebrile and hemodynamically stable here in ED. No gross hemoptysis or epistaxis. Heart sounds clear. No chest pain. Lungs with wheezing. I-stat troponin is positive. Regular troponin is also positive. Other Labwork is similar to previous findings. Xray should possible new pneumonia however CT did not show any signs of pneumonia or any signs of acute abnormality. EKG showed possible new t wave inversions but no new ST elevations, according to attending physician.  Pt declined breathing tx here.   I spoke with Dr. Vaughan Browner, critical care & pulmonology, who stated that pt's hemoptysis could possibly be due to her epistaxis and recommended possible ENT follow up. He also recommended outpt follow up with her pulmonologist regarding today's visit.  Pt's troponins are positive. I spoke with Dr. Tamala Julian, Hospitalist, who stated that there is possible new ST elevation in III and aVF and should consult Cardiology.   Cardiology consulted Dr. Aundra Dubin, Cardiologist, who did not seem very concerned with the possible new ST elevation in III and aVT and stated that it seems like her baseline, he did mention some possible new T-wave inversion. He stated that he would like Hospitalist to admit and trend troponin and if increased significantly and pt developed chest pain he would then see patient.   I spoke again with Dr. Tamala Julian who agreed to have pt admitted for  further evaluation and ACS rule out.   Pt seen and evaluated by Dr. Maryan Rued, attending, who agrees with assessment and plan.    Final Clinical Impressions(s) / ED Diagnoses   Final diagnoses:  Hemoptysis  Elevated troponin    New Prescriptions New Prescriptions  No medications on file     Bettey Costa, [place] 03/03/17 2048    Blanchie Dessert, MD 03/04/17 [phone]

## 2017-03-03 NOTE — Telephone Encounter (Signed)
Very difficult to know over the phone how serious this is but needs to be evaluated in ER where they can call in  ent today  if needed

## 2017-03-03 NOTE — Progress Notes (Signed)
Attempted to call report to Ria Comment RN in the ED, was told that she will call back when she gets a moment.

## 2017-03-04 ENCOUNTER — Encounter (HOSPITAL_COMMUNITY): Payer: Self-pay | Admitting: *Deleted

## 2017-03-04 DIAGNOSIS — R042 Hemoptysis: Secondary | ICD-10-CM

## 2017-03-04 DIAGNOSIS — J449 Chronic obstructive pulmonary disease, unspecified: Secondary | ICD-10-CM | POA: Diagnosis not present

## 2017-03-04 DIAGNOSIS — C3492 Malignant neoplasm of unspecified part of left bronchus or lung: Secondary | ICD-10-CM | POA: Diagnosis not present

## 2017-03-04 LAB — BASIC METABOLIC PANEL
ANION GAP: 8 (ref 5–15)
BUN: 44 mg/dL — AB (ref 6–20)
CHLORIDE: 103 mmol/L (ref 101–111)
CO2: 28 mmol/L (ref 22–32)
Calcium: 9 mg/dL (ref 8.9–10.3)
Creatinine, Ser: 1.82 mg/dL — ABNORMAL HIGH (ref 0.44–1.00)
GFR, EST AFRICAN AMERICAN: 30 mL/min — AB (ref >60)
GFR, EST NON AFRICAN AMERICAN: 26 mL/min — AB (ref >60)
Glucose, Bld: 128 mg/dL — ABNORMAL HIGH (ref 65–99)
POTASSIUM: 4.5 mmol/L (ref 3.5–5.1)
SODIUM: 139 mmol/L (ref 135–145)

## 2017-03-04 LAB — CBC
HCT: 25 % — ABNORMAL LOW (ref 36.0–46.0)
Hemoglobin: 8.2 g/dL — ABNORMAL LOW (ref 12.0–15.0)
MCH: 30.5 pg (ref 26.0–34.0)
MCHC: 32.8 g/dL (ref 30.0–36.0)
MCV: 92.9 fL (ref 78.0–100.0)
PLATELETS: 197 10*3/uL (ref 150–400)
RBC: 2.69 MIL/uL — AB (ref 3.87–5.11)
RDW: 14.5 % (ref 11.5–15.5)
WBC: 6.7 10*3/uL (ref 4.0–10.5)

## 2017-03-04 LAB — TROPONIN I: Troponin I: 0.07 ng/mL (ref <0.03)

## 2017-03-04 LAB — PROTIME-INR
INR: 2.07
PROTHROMBIN TIME: 23.6 s — AB (ref 11.4–15.2)

## 2017-03-04 LAB — PREPARE RBC (CROSSMATCH)

## 2017-03-04 MED ORDER — SODIUM CHLORIDE 0.9 % IV SOLN: Freq: Once | INTRAVENOUS | Status: DC

## 2017-03-04 MED ORDER — WARFARIN SODIUM 5 MG PO TABS: 2.5000 mg | ORAL_TABLET | Freq: Once | ORAL | Status: DC

## 2017-03-04 MED ORDER — WARFARIN SODIUM 5 MG PO TABS: 5.0000 mg | ORAL_TABLET | Freq: Once | ORAL | Status: DC

## 2017-03-04 NOTE — Discharge Summary (Signed)
Physician Discharge Summary  Renee Vance OZY:[id number] DOB: 26-Jun-1941 DOA: 03/03/2017  PCP: [name], PA-C  Admit date: 03/03/2017 Discharge date: 03/04/2017  Recommendations for Outpatient Follow-up:  1. Pt will need to follow up with PCP early next week 2. Pt advised to stop taking Coumadin until May 30th, 2018, if bleeding stops, no further episodes, pt to see coumadin RN for blood work and have PCP adjust the dose of Coumadin 3. Pt advised to immediately go to ED if bleeding starts again despite no coumadin use 4. Please note that pt was transfused one U of PRBC prior to discharge as per oncologist recommendations  5. Please obtain BMP to evaluate electrolytes and kidney function 6. Please also check CBC to evaluate Hg and Hct levels  Discharge Diagnoses:  Principal Problem:   Hemoptysis Active Problems:   COPD (chronic obstructive pulmonary disease) (HCC)   Chronic anticoagulation   Antineoplastic chemotherapy induced anemia   Chronic respiratory failure with hypoxia (HCC)   Elevated troponin  Discharge Condition: Stable  Diet recommendation: Heart healthy diet discussed in details   History of present illness:  Pt is 76 yo female with known NSCLC, metastatic (adrenal met), follows with Dr. Julien Nordmann, CAD s/p PCI, PAF on Coumadin, presented to [facility] ED with main concern of one day duration of hemoptysis. Pt says she has had 4 episodes of small amount of coughing blood, each episode about < 1/2 teaspoon. Since arrival to ED, hemoptysis resolved.   Hospital Course:  Principal Problem:   Hemoptysis - unclear etiology but in the setting of coumadin and known lung cancer, chronic and extensive fibrotic changes mostly in the left lung - this has now resolved - spoke with PCCM on call Dr. Chuck Hint, recommended holding Coumadin for few days and if bleeding reoccurs, needs further eval, may need lto stop Coumadin permanently - spoke with oncologist, recommended one U PRBC  transfusion, outpatient follow up  Active Problems:   Chronic resp failure with hypoxia, COPD (chronic obstructive pulmonary disease) (HCC) - oxygen dependent - no acute flare at this time  - continue bronchodilators per home medical regimen     Chronic anticoagulation - for a-fib - Coumadin on hold for now until we make sure bleeding is completely resolved  - pt already has an appointment with RN for coumadin lab check     Antineoplastic chemotherapy induced anemia - 1 U PRBC transfused prior to discharge    HTN - resume home medical regimen     Hypothyroidism  - continue home medical regimen     Elevated troponin - in the setting of hemoptysis - no chest pain this AM - troponins trending down     CKD stage III  - Cr appears to be at pt's baseline   Procedures/Studies: Dg Chest 2 View  Result Date: 03/03/2017 CLINICAL DATA:  Hemoptysis and worsening shortness of breath EXAM: CHEST  2 VIEW COMPARISON:  CT chest 12/26/2016, radiograph 11/28/2016, 11/01/2016 FINDINGS: Small left pleural effusion or thickening. Diffuse fibrosis. Parenchymal pattern on the right appears unchanged radiographically. Left upper and lower lobe and perihilar interstitial and alveolar opacity probably stable. Left perihilar region bronchiectasis. Slight worsened opacification at the left base. Stable cardiomediastinal silhouette with atherosclerosis. IMPRESSION: 1. Peripheral interstitial opacities in the right lung appear grossly unchanged compared to the most recent prior radiograph. 2. Bronchiectasis, interstitial and alveolar process in the left perihilar region also probably stable compared with prior chest CT from March 2018. 3. Slight increased density at the  left lung base, cannot exclude superimposed pneumonia 4. Left pleural disease grossly stable Electronically Signed   By: Donavan Foil M.D.   On: 03/03/2017 14:18   Ct Chest Wo Contrast  Result Date: 03/03/2017 CLINICAL DATA:  History of lung  carcinoma with current hemoptysis EXAM: CT CHEST WITHOUT CONTRAST TECHNIQUE: Multidetector CT imaging of the chest was performed following the standard protocol without IV contrast. COMPARISON:  01/18/2017, 12/26/2016 FINDINGS: Cardiovascular: Somewhat limited due to the lack of IV contrast. Aortic calcifications are seen without aneurysmal dilatation. Coronary calcifications are noted. No significant cardiac enlargement is seen. Mediastinum/Nodes: The thoracic inlet is within normal limits. Scattered small lymph nodes are noted within the mediastinum. These are stable from the prior examination and were not shown to be hypermetabolic on recent PET-CT. The esophagus as visualized is within normal limits. Lungs/Pleura: Chronic fibrotic changes are again identified bilaterally. Diffuse bronchiectatic changes are seen throughout the left lung with peripheral fibrotic changes stable from the recent PET-CT. Left-sided pleural effusion is again identified and stable. Upper Abdomen: Gall bladder has been surgically removed. The left adrenal nodule seen previously is again identified. Musculoskeletal: Degenerative changes of the thoracic spine are seen. IMPRESSION: Stable left adrenal nodule. Stable chronic fibrotic changes in both lungs. Stable mediastinal lymph nodes are noted as well. Left pleural effusion. No definitive acute abnormality is seen. Electronically Signed   By: Inez Catalina M.D.   On: 03/03/2017 17:02   Ct Biopsy  Result Date: 02/08/2017 INDICATION: History of lung cancer, now with indeterminate hypermetabolic left adrenal gland nodule. EXAM: CT GUIDED BIOPSY OF INDETERMINATE LEFT ADRENAL GLAND NODULE COMPARISON:  PET-CT - 01/18/2017; CT abdomen pelvis - 01/02/2017 MEDICATIONS: None. ANESTHESIA/SEDATION: Fentanyl 50 mcg IV; Versed 1 mg IV Sedation time: 20 minutes; The patient was continuously monitored during the procedure by the interventional radiology nurse under my direct supervision. CONTRAST:   None. COMPLICATIONS: None immediate. PROCEDURE: Informed consent was obtained from the patient following an explanation of the procedure, risks, benefits and alternatives. A time out was performed prior to the initiation of the procedure. The patient was positioned prone on the CT table and a limited CT was performed for procedural planning demonstrating unchanged size and appearance of the approximately 1.2 x 0.9 cm left adrenal gland nodule (image 16, series 5). The procedure was planned. The operative site was prepped and draped in the usual sterile fashion. Appropriate trajectory was confirmed with a 22 gauge spinal needle after the adjacent tissues were anesthetized with 1% Lidocaine with epinephrine. Under intermittent CT guidance, a 17 gauge coaxial needle was advanced into the peripheral aspect of the mass. Appropriate positioning was confirmed and 4 core needle biopsy samples were obtained with an 18 gauge core needle biopsy device. The co-axial needle was removed and hemostasis was achieved with manual compression. A limited postprocedural CT was negative for hemorrhage or additional complication. A dressing was placed. The patient tolerated the procedure well without immediate postprocedural complication. IMPRESSION: Technically successful CT guided core needle biopsy of indeterminate left adrenal gland nodule. Electronically Signed   By: Sandi Mariscal M.D.   On: 02/08/2017 12:10     Discharge Exam: Vitals:   03/04/17 0439 03/04/17 1026  BP: 135/65 (!) 125/52  Pulse: 62   Resp: 18   Temp: 99.4 F (37.4 C)    Vitals:   03/03/17 2353 03/04/17 0439 03/04/17 1026 03/04/17 1055  BP: (!) 104/59 135/65 (!) 125/52   Pulse: 68 62    Resp: 20 18  Temp: 99.4 F (37.4 C) 99.4 F (37.4 C)    TempSrc: Oral Oral    SpO2: 93% 98%  98%  Weight:      Height:        General: Pt is alert, follows commands appropriately, not in acute distress Cardiovascular: Regular rate and rhythm, S1/S2 +, no  murmurs, no rubs, no gallops Respiratory: no wheezing, no crackles, no rhonchi Abdominal: Soft, non tender, non distended, bowel sounds +, no guarding Extremities: no edema, no cyanosis, pulses palpable bilaterally DP and PT Neuro: Grossly nonfocal  Discharge Instructions   Allergies as of 03/04/2017      Reactions   Ampicillin Nausea And Vomiting   Has patient had a PCN reaction causing immediate rash, facial/tongue/throat swelling, SOB or lightheadedness with hypotension: no Has patient had a PCN reaction causing severe rash involving mucus membranes or skin necrosis: no Has patient had a PCN reaction that required hospitalization: no Has patient had a PCN reaction occurring within the last 10 years: no If all of the above answers are "NO", then may proceed with Cephalosporin use   Codeine Nausea And Vomiting      Medication List    TAKE these medications   amLODipine 5 MG tablet Commonly known as:  NORVASC Take 5 mg by mouth daily.   aspirin 81 MG tablet Take 81 mg by mouth daily.   atorvastatin 80 MG tablet Commonly known as:  LIPITOR Take 80 mg by mouth daily.   budesonide 0.5 MG/2ML nebulizer solution Commonly known as:  PULMICORT Take 2 mLs (0.5 mg total) by nebulization 2 (two) times daily.   dextromethorphan 30 MG/5ML liquid Commonly known as:  DELSYM Take 60 mg by mouth 2 (two) times daily as needed.   fluticasone furoate-vilanterol 100-25 MCG/INH Aepb Commonly known as:  BREO ELLIPTA Inhale 1 puff into the lungs daily.   furosemide 20 MG tablet Commonly known as:  LASIX Take 20 mg by mouth daily.   ipratropium-albuterol 0.5-2.5 (3) MG/3ML Soln Commonly known as:  DUONEB Take 3 mLs by nebulization every 6 (six) hours as needed.   levothyroxine 125 MCG tablet Commonly known as:  SYNTHROID, LEVOTHROID Take 125 mcg by mouth daily before breakfast.   metoprolol succinate 50 MG 24 hr tablet Commonly known as:  TOPROL-XL Take 1 tablet (50 mg total) by  mouth 2 (two) times daily.   multivitamin with minerals tablet Take 1 tablet by mouth daily.   omeprazole 20 MG capsule Commonly known as:  PRILOSEC Take 1 capsule (20 mg total) by mouth daily.   OXYGEN Inhale 2 L into the lungs continuous.   Vitamin D (Cholecalciferol) 1000 units Caps Take 1,000 Units by mouth daily.   warfarin 5 MG tablet Commonly known as:  COUMADIN Take 0.5-1 tablets (2.5-5 mg total) by mouth one time only at 6 PM. Take 2.79m MWF and take 540mevery other day of the week Start taking on:  03/08/2017 What changed:  how much to take  how to take this  when to take this  additional instructions  These instructions start on 03/08/2017. If you are unsure what to do until then, ask your doctor or other care provider.      Follow-up Information    Hepler, MaElta GuadeloupePA-C Follow up.   Specialty:  Physician Assistant Contact information: [id number] [phone]          The results of significant diagnostics from this hospitalization (including imaging,  microbiology, ancillary and laboratory) are listed below for reference.     Microbiology: No results found for this or any previous visit (from the past 240 hour(s)).   Labs: Basic Metabolic Panel:  Recent Labs Lab 03/01/17 1309 03/03/17 1547 03/04/17 0317  NA 141 139 139  K 4.1 4.1 4.5  CL  --  103 103  CO2 _0 GLUCOSE 105 95 128*  BUN 34.4* 45* 44*  CREATININE 1.7* 1.90* 1.82*  CALCIUM 9.8 9.1 9.0   Liver Function Tests:  Recent Labs Lab 03/01/17 1309 03/03/17 1547  AST 26 33  ALT 20 24  ALKPHOS 82 61  BILITOT 0.41 0.4  PROT 7.5 7.1  ALBUMIN 3.4* 3.5   CBC:  Recent Labs Lab 03/01/17 1309 03/03/17 1547 03/03/17 2106 03/04/17 0317  WBC 5.5 9.3  --  6.7  NEUTROABS 4.0  --   --   --   HGB 9.9* 8.9* 9.0* 8.2*  HCT 30.9* 27.4* 27.7* 25.0*  MCV 92.8 93.2  --  92.9  PLT 196 210  --  197   Cardiac Enzymes:  Recent Labs Lab  03/03/17 1752 03/03/17 2200 03/04/17 0317  TROPONINI 0.13* 0.11* 0.07*   BNP: BNP (last 3 results)  Recent Labs  10/30/16 0529  BNP 963.1*    SIGNED: Time coordinating discharge: 30 minutes  Faye Ramsay, MD  [facility] 03/04/2017, 12:55 PM Pager [phone]  If 7PM-7AM, please contact night-coverage [email or web address] Password [email or web address]

## 2017-03-04 NOTE — Progress Notes (Signed)
ANTICOAGULATION CONSULT NOTE - Initial Consult  Pharmacy Consult for Warfarin Indication: atrial fibrillation  Allergies  Allergen Reactions  . Ampicillin Nausea And Vomiting    Has patient had a PCN reaction causing immediate rash, facial/tongue/throat swelling, SOB or lightheadedness with hypotension: no Has patient had a PCN reaction causing severe rash involving mucus membranes or skin necrosis: no Has patient had a PCN reaction that required hospitalization: no Has patient had a PCN reaction occurring within the last 10 years: no If all of the above answers are "NO", then may proceed with Cephalosporin use  . Codeine Nausea And Vomiting    Patient Measurements: Height: 5\' 4"  (162.6 cm) Weight: 146 lb (66.2 kg) IBW/kg (Calculated) : 54.7 Heparin Dosing Weight:   Vital Signs: Temp: 99.4 F (37.4 C) (05/26 0439) Temp Source: Oral (05/26 0439) BP: 135/65 (05/26 0439) Pulse Rate: 62 (05/26 0439)  Labs:  Recent Labs  03/01/17 1309 03/01/17 1309  03/03/17 1547 03/03/17 1752 03/03/17 2106 03/03/17 2200 03/04/17 0317  HGB 9.9*  --   < > 8.9*  --  9.0*  --  8.2*  HCT 30.9*  --   --  27.4*  --  27.7*  --  25.0*  PLT 196  --   --  210  --   --   --  197  LABPROT  --   --   --  26.5*  --   --   --  23.6*  INR  --   --   --  2.39  --   --   --  2.07  CREATININE  --  1.7*  --  1.90*  --   --   --  1.82*  TROPONINI  --   --   --   --  0.13*  --  0.11* 0.07*  < > = values in this interval not displayed.  Estimated Creatinine Clearance: 24.2 mL/min (A) (by C-G formula based on SCr of 1.82 mg/dL (H)).  Medications:  Prescriptions Prior to Admission  Medication Sig Dispense Refill Last Dose  . amLODipine (NORVASC) 5 MG tablet Take 5 mg by mouth daily.   03/03/2017 at 0830  . aspirin 81 MG tablet Take 81 mg by mouth daily.   03/03/2017 at Unknown time  . atorvastatin (LIPITOR) 80 MG tablet Take 80 mg by mouth daily.   03/02/2017 at Unknown time  . budesonide (PULMICORT) 0.5  MG/2ML nebulizer solution Take 2 mLs (0.5 mg total) by nebulization 2 (two) times daily. 2 mL 60 03/03/2017 at Unknown time  . dextromethorphan (DELSYM) 30 MG/5ML liquid Take 60 mg by mouth 2 (two) times daily as needed.    03/03/2017  . furosemide (LASIX) 20 MG tablet Take 20 mg by mouth daily.    03/03/2017 at 0830  . ipratropium-albuterol (DUONEB) 0.5-2.5 (3) MG/3ML SOLN Take 3 mLs by nebulization every 6 (six) hours as needed. 360 mL 0 03/03/2017 at Unknown time  . levothyroxine (SYNTHROID, LEVOTHROID) 125 MCG tablet Take 125 mcg by mouth daily before breakfast.    03/03/2017 at Unknown time  . metoprolol succinate (TOPROL-XL) 50 MG 24 hr tablet Take 1 tablet (50 mg total) by mouth 2 (two) times daily. 180 tablet 1 03/03/2017 at 0830  . Multiple Vitamins-Minerals (MULTIVITAMIN WITH MINERALS) tablet Take 1 tablet by mouth daily.   03/03/2017 at Unknown time  . omeprazole (PRILOSEC) 20 MG capsule Take 1 capsule (20 mg total) by mouth daily. 30 capsule 2 03/03/2017 at Unknown time  . Vitamin  D, Cholecalciferol, 1000 UNITS CAPS Take 1,000 Units by mouth daily.    03/03/2017 at Unknown time  . warfarin (COUMADIN) 5 MG tablet Take 1/2 tablet daily except 1 tablet on Sundays and Thursdays (Patient taking differently: Take 2.5-5 mg by mouth one time only at 6 PM. Take 2.5mg  MWF and take 5mg  every other day of the week) 90 tablet 3 03/02/2017 at Unknown time  . fluticasone furoate-vilanterol (BREO ELLIPTA) 100-25 MCG/INH AEPB Inhale 1 puff into the lungs daily. (Patient not taking: Reported on 03/03/2017) 1 each 0 Not Taking at Unknown time  . OXYGEN Inhale 2 L into the lungs continuous.   Taking   Scheduled:  . amLODipine  5 mg Oral Daily  . arformoterol  15 mcg Nebulization BID  . budesonide  0.5 mg Nebulization BID  . levothyroxine  125 mcg Oral QAC breakfast  . metoprolol succinate  50 mg Oral BID  . pantoprazole  40 mg Oral Daily  . sodium chloride flush  3 mL Intravenous Q12H  . Warfarin - Pharmacist  Dosing Inpatient   Does not apply q1800    Assessment: 75yoF on with PAF on warfarin and NSCLC on chemo presents with hemoptysis and pleuritic CP. Other PMH include, anemia, CAD s/p PCI, COPD, dCHF, HTN, hypothyroid. No gross hemoptysis or epistaxis in ED; CT chest negative for pulmonary hemorrhage. Admitting patient for ACS r/o. Pharmacy to continue warfarin while admitted.   Baseline INR therapeutic  Prior anticoagulation: Warfarin 5 mg daily except 2.5 mg on MWF; LD 5/24 PM  Significant events:  Today, 03/04/2017:  CBC: Hgb slightly worse from yesterday; Plt wnl  INR remains therapeutic  Major drug interactions: none  No bleeding issues per nursing  Eating 25% of meals  Goal of Therapy: INR 2-3  Plan:  Warfarin 5 mg PO tonight at 18:00  Daily INR  CBC at least q72 hr while on warfarin  Monitor for signs of bleeding or thrombosis   Reuel Boom, PharmD Pager: [phone] 03/04/2017, 9:59 AM

## 2017-03-04 NOTE — Discharge Instructions (Signed)
°·   Stop taking Coumadin until 03/08/2017.  If you continue to cough blood, despite not taking Coumadin, please go to emergency room immediately    Hemoptysis Hemoptysis is when you cough up blood. It can be mild or serious. If it is mild, you may cough up bloody spit and mucus (sputum). If you cough up 1-2 cups (240-480 mL) of blood within 24 hours (massive hemoptysis), it is an emergency. If you cough up blood, it is important to go and see your doctor. Follow these instructions at home:  Watch your condition for any changes.  Take over-the-counter and prescription medicines only as told by your doctor.  If you were prescribed an antibiotic medicine, take it as told by your doctor. Do not stop taking the antibiotic even if you start to feel better.  Go back to your normal activities as told by your doctor. Ask your doctor what activities are safe for you to do.  Do not use any products that contain nicotine or tobacco. These include cigarettes and e-cigarettes. If you need help quitting, ask your doctor.  Keep all follow-up visits as told by your doctor. This is important. Contact a doctor if:  You have a fever.  You cough up bloody spit and mucus. Get help right away if:  You cough up fresh blood or blood clots.  You have trouble breathing.  You have chest pain. This information is not intended to replace advice given to you by your health care provider. Make sure you discuss any questions you have with your health care provider. Document Released: 09/12/2012 Document Revised: 06/24/2016 Document Reviewed: 06/24/2016 Elsevier Interactive Patient Education  2017 [employer].

## 2017-03-04 NOTE — Care Management Note (Signed)
Case Management Note  Patient Details  Name: Renee Vance MRN: [id number] Date of Birth: [birth year]  Subjective/Objective:     Hemoptysis, elevated troponin, antineoplastic chemotherapy               Action/Plan: Discharge Planning: Chart reviewed. Pt oxygen is supplied by [facility] and family did bring portable. No NCM needs identified.   PCP [name] MD   Expected Discharge Date:  03/04/17               Expected Discharge Plan:  Home/Self Care  In-House Referral:  NA  Discharge planning Services  CM Consult  Post Acute Care Choice:  NA Choice offered to:  NA  DME Arranged:  N/A DME Agency:  NA  HH Arranged:  NA HH Agency:  NA  Status of Service:  Completed, signed off  If discussed at [place] of Stay Meetings, dates discussed:    Additional Comments:  Erenest Rasher, RN 03/04/2017, 2:01 PM

## 2017-03-04 NOTE — Progress Notes (Signed)
Pt is discharged to home. DC instructions given. No concerns voiced. Pt left unit in wheelchair pushed by nurse tech accompanied by husband. Left in good condition. Hale Bogus.

## 2017-03-06 DIAGNOSIS — J449 Chronic obstructive pulmonary disease, unspecified: Secondary | ICD-10-CM | POA: Diagnosis not present

## 2017-03-06 DIAGNOSIS — C3492 Malignant neoplasm of unspecified part of left bronchus or lung: Secondary | ICD-10-CM | POA: Diagnosis not present

## 2017-03-06 LAB — TYPE AND SCREEN
ABO/RH(D): B NEG
Antibody Screen: NEGATIVE
UNIT DIVISION: 0

## 2017-03-06 LAB — BPAM RBC
Blood Product Expiration Date: 201806142359
ISSUE DATE / TIME: 201805261455
Unit Type and Rh: 1700

## 2017-03-07 ENCOUNTER — Telehealth: Payer: Self-pay

## 2017-03-07 NOTE — Telephone Encounter (Signed)
Pt called for coughing up blood. Went to ED, kept her overnight. Did get a unit of blood.   She is asking if she needs to keep her appt tomorrow for gemzar.   Last night fever 100.3. No cough today, yesterday still coughing up blood. Normal SOB, not increased. No diarrhea no nausea, no gu issues. No chest pain.   Is there a chance she can see Dr Julien Nordmann tomorrow b/c she does not know what is causing this stuff, the new chemo or what. Troponin is elevated.

## 2017-03-07 NOTE — Telephone Encounter (Signed)
Called pt to check on her status and future appts. Pt states she is "coughing up dark brown color, if I get up I feel very tired like I'm going to pass out,  And I  have to sit back down."  Pt was instructed by the ED  to call Dr. Elsworth Soho about recent hospital visit. Informed pt  Rn will call her tomorrow after will speak with MD regarding her lab/chemo(gemzar appt) and if MD would like her to keep or cancel these appts. Instructed pt to call 911 or go to ED if symptoms worsen or becomes short of breath. Pt verbalized understanding.

## 2017-03-08 ENCOUNTER — Telehealth: Payer: Self-pay | Admitting: Medical Oncology

## 2017-03-08 ENCOUNTER — Other Ambulatory Visit (HOSPITAL_BASED_OUTPATIENT_CLINIC_OR_DEPARTMENT_OTHER): Payer: PPO

## 2017-03-08 ENCOUNTER — Ambulatory Visit (HOSPITAL_BASED_OUTPATIENT_CLINIC_OR_DEPARTMENT_OTHER): Payer: PPO

## 2017-03-08 ENCOUNTER — Encounter: Payer: Self-pay | Admitting: Nurse Practitioner

## 2017-03-08 ENCOUNTER — Other Ambulatory Visit: Payer: Self-pay | Admitting: Medical Oncology

## 2017-03-08 ENCOUNTER — Telehealth: Payer: Self-pay | Admitting: Pulmonary Disease

## 2017-03-08 VITALS — BP 97/65 | HR 60 | Temp 98.5°F | Resp 20

## 2017-03-08 DIAGNOSIS — Z87898 Personal history of other specified conditions: Secondary | ICD-10-CM

## 2017-03-08 DIAGNOSIS — C3412 Malignant neoplasm of upper lobe, left bronchus or lung: Secondary | ICD-10-CM

## 2017-03-08 DIAGNOSIS — Z5111 Encounter for antineoplastic chemotherapy: Secondary | ICD-10-CM

## 2017-03-08 DIAGNOSIS — Z8709 Personal history of other diseases of the respiratory system: Secondary | ICD-10-CM

## 2017-03-08 DIAGNOSIS — C7972 Secondary malignant neoplasm of left adrenal gland: Secondary | ICD-10-CM

## 2017-03-08 DIAGNOSIS — C3492 Malignant neoplasm of unspecified part of left bronchus or lung: Secondary | ICD-10-CM

## 2017-03-08 DIAGNOSIS — J9611 Chronic respiratory failure with hypoxia: Secondary | ICD-10-CM

## 2017-03-08 LAB — COMPREHENSIVE METABOLIC PANEL
ALBUMIN: 3.2 g/dL — AB (ref 3.5–5.0)
ALT: 17 U/L (ref 0–55)
AST: 26 U/L (ref 5–34)
Alkaline Phosphatase: 82 U/L (ref 40–150)
Anion Gap: 9 mEq/L (ref 3–11)
BILIRUBIN TOTAL: 1.08 mg/dL (ref 0.20–1.20)
BUN: 46.1 mg/dL — AB (ref 7.0–26.0)
CALCIUM: 9.5 mg/dL (ref 8.4–10.4)
CO2: 26 mEq/L (ref 22–29)
Chloride: 101 mEq/L (ref 98–109)
Creatinine: 1.9 mg/dL — ABNORMAL HIGH (ref 0.6–1.1)
EGFR: 24 mL/min/{1.73_m2} — ABNORMAL LOW (ref >90)
Glucose: 77 mg/dl (ref 70–140)
Potassium: 4.1 mEq/L (ref 3.5–5.1)
Sodium: 136 mEq/L (ref 136–145)
Total Protein: 7.4 g/dL (ref 6.4–8.3)

## 2017-03-08 LAB — CBC WITH DIFFERENTIAL/PLATELET
BASO%: 0.2 % (ref 0.0–2.0)
BASOS ABS: 0 10*3/uL (ref 0.0–0.1)
EOS%: 1.2 % (ref 0.0–7.0)
Eosinophils Absolute: 0.1 10*3/uL (ref 0.0–0.5)
HEMATOCRIT: 30.8 % — AB (ref 34.8–46.6)
HEMOGLOBIN: 10.3 g/dL — AB (ref 11.6–15.9)
LYMPH#: 0.5 10*3/uL — AB (ref 0.9–3.3)
LYMPH%: 10.7 % — ABNORMAL LOW (ref 14.0–49.7)
MCH: 29.8 pg (ref 25.1–34.0)
MCHC: 33.4 g/dL (ref 31.5–36.0)
MCV: 89 fL (ref 79.5–101.0)
MONO#: 0.1 10*3/uL (ref 0.1–0.9)
MONO%: 1.6 % (ref 0.0–14.0)
NEUT#: 4.4 10*3/uL (ref 1.5–6.5)
NEUT%: 86.3 % — ABNORMAL HIGH (ref 38.4–76.8)
Platelets: 120 10*3/uL — ABNORMAL LOW (ref 145–400)
RBC: 3.46 10*6/uL — ABNORMAL LOW (ref 3.70–5.45)
RDW: 14.1 % (ref 11.2–14.5)
WBC: 5.1 10*3/uL (ref 3.9–10.3)

## 2017-03-08 MED ORDER — PROCHLORPERAZINE MALEATE 10 MG PO TABS
ORAL_TABLET | ORAL | Status: AC
  Filled 2017-03-08: qty 1

## 2017-03-08 MED ORDER — SODIUM CHLORIDE 0.9 % IV SOLN
1000.0000 mg/m2 | Freq: Once | INTRAVENOUS | Status: AC
  Administered 2017-03-08: 1748 mg via INTRAVENOUS
  Filled 2017-03-08: qty 46

## 2017-03-08 MED ORDER — SODIUM CHLORIDE 0.9 % IV SOLN
Freq: Once | INTRAVENOUS | Status: AC
  Administered 2017-03-08: 16:00:00 via INTRAVENOUS

## 2017-03-08 MED ORDER — PROCHLORPERAZINE MALEATE 10 MG PO TABS
10.0000 mg | ORAL_TABLET | Freq: Once | ORAL | Status: AC
Start: 2017-03-08 — End: 2017-03-08
  Administered 2017-03-08: 10 mg via ORAL

## 2017-03-08 NOTE — Patient Instructions (Signed)
[facility] Discharge Instructions for Patients Receiving Chemotherapy  Today you received the following chemotherapy agents Gemzar  To help prevent nausea and vomiting after your treatment, we encourage you to take your nausea medication as prescribed   If you develop nausea and vomiting that is not controlled by your nausea medication, call the clinic.   BELOW ARE SYMPTOMS THAT SHOULD BE REPORTED IMMEDIATELY:  *FEVER GREATER THAN 100.5 F  *CHILLS WITH OR WITHOUT FEVER  NAUSEA AND VOMITING THAT IS NOT CONTROLLED WITH YOUR NAUSEA MEDICATION  *UNUSUAL SHORTNESS OF BREATH  *UNUSUAL BRUISING OR BLEEDING  TENDERNESS IN MOUTH AND THROAT WITH OR WITHOUT PRESENCE OF ULCERS  *URINARY PROBLEMS  *BOWEL PROBLEMS  UNUSUAL RASH Items with * indicate a potential emergency and should be followed up as soon as possible.  Feel free to call the clinic you have any questions or concerns. The clinic phone number is [phone].  Please show the CHEMO ALERT CARD at check-in to the Emergency Department and triage nurse.   

## 2017-03-08 NOTE — Progress Notes (Signed)
Spoke to pt regarding drop in BP, pt is on BP medications, advised pt to monitor BP at home and notify MD who prescribed BP meds of drop in BP. Pt verbalized understanding.  Per Dr Julien Nordmann ok to tx with creatinine of 1.9.

## 2017-03-08 NOTE — Telephone Encounter (Signed)
I told pt to keep appts today.

## 2017-03-08 NOTE — Telephone Encounter (Signed)
Spoke with patient and husband. Pt was in the hospital on 03/03/17 for hemoptysis. She stated she only stayed overnight. Since she has been diagnosed, she has been coughing brown mucus but feels fine. She said the coughing started after starting a new chemo product.  She currently has an appt with TP and wants to know if she needs to be seen sooner.   Pt also wants to know if she can have a RX for a POC. An order was placed on 02/24/17 for [facility] but [facility] advised her that they do not have the product she wants. She called her insurance and they told her to contact [facility] in [place]. They have the [facility] POC that she wants. Is it ok to send in a new order for [place] [facility] since the original was sent to [facility]?    RA, please advise.

## 2017-03-09 ENCOUNTER — Ambulatory Visit (INDEPENDENT_AMBULATORY_CARE_PROVIDER_SITE_OTHER): Payer: PPO | Admitting: *Deleted

## 2017-03-09 DIAGNOSIS — Z5181 Encounter for therapeutic drug level monitoring: Secondary | ICD-10-CM | POA: Diagnosis not present

## 2017-03-09 DIAGNOSIS — I480 Paroxysmal atrial fibrillation: Secondary | ICD-10-CM | POA: Diagnosis not present

## 2017-03-09 LAB — POCT INR: INR: 1

## 2017-03-09 NOTE — Telephone Encounter (Signed)
Please get an earlier appointment with TP within 1 week Okay to send prescription to [facility] for [place]

## 2017-03-09 NOTE — Telephone Encounter (Signed)
LMTCB

## 2017-03-10 NOTE — Telephone Encounter (Signed)
Spoke with pt, rov scheduled with TP, poc ordered to [facility].  Thanks.

## 2017-03-10 NOTE — Telephone Encounter (Signed)
Pt husband returning call  [phone].Hillery Hunter

## 2017-03-13 ENCOUNTER — Telehealth: Payer: Self-pay | Admitting: *Deleted

## 2017-03-13 NOTE — Telephone Encounter (Addendum)
Pt told to stop coumadin per Dr Thompson Caul order.  ----- Message from Belva Crome, MD sent at 03/10/2017  9:16 AM EDT ----- Stop coumadin ----- Message ----- From: Malen Gauze, RN Sent: 03/09/2017   3:38 PM To: Belva Crome, MD, Burtis Junes, NP  Dr Tamala Julian, Please review pt's recent hospital D/C summary from 5/25-5/26 in regards to anticoagulation. I saw pt in the coumadin clinic today.  INR was 1.0 off coumadin since discharge.  She has not had any increased hymoptysis.  Had 1 episode today of pink tinged sputum (less than 1 Tsp)  Her INR was 2.3 on admission and 2.0 at her last visit with me.  I started her back on her coumadin today with f/u in 1 week.  She has an appt with Cecille Rubin on 03/21/17.  Her recent EKG's were sinus rhythm - sinus brady. If you think she should not be restarted on coumadin please let me know.  I will try to keep her at the low end of her range until she can be evaluated by Cecille Rubin. Thanks, Edrick Oh RN  Coumadin Clinic [facility]

## 2017-03-13 NOTE — Telephone Encounter (Signed)
Told pt to stop coumadin per Dr Thompson Caul insructions

## 2017-03-13 NOTE — Telephone Encounter (Signed)
-----   Message from Loren Racer, LPN sent at 10/11/1622  3:39 PM EDT ----- Regarding: FW: Hemoptysis on coumadin. Metastatic lung cancer   ----- Message ----- From: [name], MD Sent: 03/10/2017   9:07 AM To: Belva Crome, MD, Burtis Junes, NP, # Subject: RE: Hemoptysis on coumadin. Metastatic lung #  She had hemoptysis recently. If coumadin is not absolutely needed for her A Fib, I would continue to hold it. Thank you. Mohamed ----- Message ----- From: Belva Crome, MD Sent: 03/10/2017   8:58 AM To: Burtis Junes, NP, [name], MD, # Subject: Hemoptysis on coumadin. Metastatic lung canc#  Let her know that Dr. Julien Nordmann should help [place] decide if resuming coumadin makes sense. ----- Message ----- From: Burtis Junes, NP Sent: 03/10/2017   7:28 AM To: Belva Crome, MD, Malen Gauze, RN  I am deferring this to Dr. Sinclair Ship ----- Message ----- From: Malen Gauze, RN Sent: 03/09/2017   3:38 PM To: Belva Crome, MD, Burtis Junes, NP  Dr Tamala Julian, Please review pt's recent hospital D/C summary from 5/25-5/26 in regards to anticoagulation. I saw pt in the coumadin clinic today.  INR was 1.0 off coumadin since discharge.  She has not had any increased hymoptysis.  Had 1 episode today of pink tinged sputum (less than 1 Tsp)  Her INR was 2.3 on admission and 2.0 at her last visit with me.  I started her back on her coumadin today with f/u in 1 week.  She has an appt with Cecille Rubin on 03/21/17.  Her recent EKG's were sinus rhythm - sinus brady. If you think she should not be restarted on coumadin please let me know.  I will try to keep her at the low end of her range until she can be evaluated by Cecille Rubin. Thanks, Edrick Oh RN  Coumadin Clinic [facility]

## 2017-03-14 DIAGNOSIS — S40029A Contusion of unspecified upper arm, initial encounter: Secondary | ICD-10-CM | POA: Diagnosis not present

## 2017-03-14 DIAGNOSIS — R040 Epistaxis: Secondary | ICD-10-CM | POA: Diagnosis not present

## 2017-03-14 DIAGNOSIS — R300 Dysuria: Secondary | ICD-10-CM | POA: Diagnosis not present

## 2017-03-14 DIAGNOSIS — M542 Cervicalgia: Secondary | ICD-10-CM | POA: Diagnosis not present

## 2017-03-15 ENCOUNTER — Telehealth: Payer: Self-pay | Admitting: Internal Medicine

## 2017-03-15 ENCOUNTER — Ambulatory Visit: Payer: PPO

## 2017-03-15 ENCOUNTER — Encounter (HOSPITAL_COMMUNITY): Payer: Self-pay

## 2017-03-15 ENCOUNTER — Other Ambulatory Visit: Payer: Self-pay | Admitting: Medical Oncology

## 2017-03-15 ENCOUNTER — Inpatient Hospital Stay (HOSPITAL_COMMUNITY): Payer: PPO

## 2017-03-15 ENCOUNTER — Other Ambulatory Visit (HOSPITAL_BASED_OUTPATIENT_CLINIC_OR_DEPARTMENT_OTHER): Payer: PPO

## 2017-03-15 ENCOUNTER — Inpatient Hospital Stay (HOSPITAL_COMMUNITY)
Admission: AD | Admit: 2017-03-15 | Discharge: 2017-03-21 | DRG: 808 | Disposition: A | Discharge status: Home / Self Care | Payer: PPO | Source: Ambulatory Visit | Attending: Internal Medicine | Admitting: Internal Medicine

## 2017-03-15 ENCOUNTER — Ambulatory Visit (HOSPITAL_COMMUNITY)
Admission: RE | Admit: 2017-03-15 | Discharge: 2017-03-15 | Disposition: A | Discharge status: Home / Self Care | Payer: PPO | Source: Ambulatory Visit | Attending: Internal Medicine | Admitting: Internal Medicine

## 2017-03-15 ENCOUNTER — Ambulatory Visit (HOSPITAL_BASED_OUTPATIENT_CLINIC_OR_DEPARTMENT_OTHER): Payer: PPO | Admitting: Internal Medicine

## 2017-03-15 ENCOUNTER — Telehealth: Payer: Self-pay

## 2017-03-15 ENCOUNTER — Encounter: Payer: Self-pay | Admitting: Internal Medicine

## 2017-03-15 VITALS — BP 94/43 | HR 73 | Temp 98.8°F | Resp 18 | Ht 64.0 in | Wt 146.4 lb

## 2017-03-15 DIAGNOSIS — D696 Thrombocytopenia, unspecified: Secondary | ICD-10-CM | POA: Diagnosis not present

## 2017-03-15 DIAGNOSIS — T451X5A Adverse effect of antineoplastic and immunosuppressive drugs, initial encounter: Secondary | ICD-10-CM

## 2017-03-15 DIAGNOSIS — D649 Anemia, unspecified: Secondary | ICD-10-CM

## 2017-03-15 DIAGNOSIS — D701 Agranulocytosis secondary to cancer chemotherapy: Secondary | ICD-10-CM | POA: Diagnosis not present

## 2017-03-15 DIAGNOSIS — D709 Neutropenia, unspecified: Secondary | ICD-10-CM | POA: Diagnosis not present

## 2017-03-15 DIAGNOSIS — C3492 Malignant neoplasm of unspecified part of left bronchus or lung: Secondary | ICD-10-CM

## 2017-03-15 DIAGNOSIS — R042 Hemoptysis: Secondary | ICD-10-CM | POA: Diagnosis not present

## 2017-03-15 DIAGNOSIS — I959 Hypotension, unspecified: Secondary | ICD-10-CM | POA: Diagnosis not present

## 2017-03-15 DIAGNOSIS — E039 Hypothyroidism, unspecified: Secondary | ICD-10-CM | POA: Diagnosis present

## 2017-03-15 DIAGNOSIS — R918 Other nonspecific abnormal finding of lung field: Secondary | ICD-10-CM | POA: Diagnosis not present

## 2017-03-15 DIAGNOSIS — R059 Cough, unspecified: Secondary | ICD-10-CM

## 2017-03-15 DIAGNOSIS — K1230 Oral mucositis (ulcerative), unspecified: Secondary | ICD-10-CM | POA: Diagnosis present

## 2017-03-15 DIAGNOSIS — C3412 Malignant neoplasm of upper lobe, left bronchus or lung: Secondary | ICD-10-CM

## 2017-03-15 DIAGNOSIS — J449 Chronic obstructive pulmonary disease, unspecified: Secondary | ICD-10-CM | POA: Diagnosis present

## 2017-03-15 DIAGNOSIS — C7972 Secondary malignant neoplasm of left adrenal gland: Secondary | ICD-10-CM

## 2017-03-15 DIAGNOSIS — I10 Essential (primary) hypertension: Secondary | ICD-10-CM | POA: Diagnosis present

## 2017-03-15 DIAGNOSIS — R040 Epistaxis: Secondary | ICD-10-CM | POA: Diagnosis not present

## 2017-03-15 DIAGNOSIS — R5081 Fever presenting with conditions classified elsewhere: Secondary | ICD-10-CM

## 2017-03-15 DIAGNOSIS — Z923 Personal history of irradiation: Secondary | ICD-10-CM | POA: Diagnosis not present

## 2017-03-15 DIAGNOSIS — I4891 Unspecified atrial fibrillation: Secondary | ICD-10-CM

## 2017-03-15 DIAGNOSIS — N179 Acute kidney failure, unspecified: Secondary | ICD-10-CM | POA: Diagnosis not present

## 2017-03-15 DIAGNOSIS — J9611 Chronic respiratory failure with hypoxia: Secondary | ICD-10-CM

## 2017-03-15 DIAGNOSIS — Z880 Allergy status to penicillin: Secondary | ICD-10-CM

## 2017-03-15 DIAGNOSIS — Z7982 Long term (current) use of aspirin: Secondary | ICD-10-CM

## 2017-03-15 DIAGNOSIS — I480 Paroxysmal atrial fibrillation: Secondary | ICD-10-CM | POA: Diagnosis not present

## 2017-03-15 DIAGNOSIS — Z955 Presence of coronary angioplasty implant and graft: Secondary | ICD-10-CM

## 2017-03-15 DIAGNOSIS — D6481 Anemia due to antineoplastic chemotherapy: Secondary | ICD-10-CM | POA: Diagnosis not present

## 2017-03-15 DIAGNOSIS — E038 Other specified hypothyroidism: Secondary | ICD-10-CM | POA: Diagnosis not present

## 2017-03-15 DIAGNOSIS — D6959 Other secondary thrombocytopenia: Secondary | ICD-10-CM | POA: Diagnosis not present

## 2017-03-15 DIAGNOSIS — J189 Pneumonia, unspecified organism: Secondary | ICD-10-CM | POA: Diagnosis not present

## 2017-03-15 DIAGNOSIS — R300 Dysuria: Secondary | ICD-10-CM

## 2017-03-15 DIAGNOSIS — Z7901 Long term (current) use of anticoagulants: Secondary | ICD-10-CM

## 2017-03-15 DIAGNOSIS — I2510 Atherosclerotic heart disease of native coronary artery without angina pectoris: Secondary | ICD-10-CM | POA: Diagnosis not present

## 2017-03-15 DIAGNOSIS — I25118 Atherosclerotic heart disease of native coronary artery with other forms of angina pectoris: Secondary | ICD-10-CM | POA: Diagnosis not present

## 2017-03-15 DIAGNOSIS — J440 Chronic obstructive pulmonary disease with acute lower respiratory infection: Secondary | ICD-10-CM | POA: Diagnosis not present

## 2017-03-15 DIAGNOSIS — E860 Dehydration: Secondary | ICD-10-CM | POA: Diagnosis not present

## 2017-03-15 DIAGNOSIS — R05 Cough: Secondary | ICD-10-CM | POA: Diagnosis not present

## 2017-03-15 DIAGNOSIS — I25119 Atherosclerotic heart disease of native coronary artery with unspecified angina pectoris: Secondary | ICD-10-CM | POA: Diagnosis present

## 2017-03-15 DIAGNOSIS — N184 Chronic kidney disease, stage 4 (severe): Secondary | ICD-10-CM | POA: Diagnosis present

## 2017-03-15 DIAGNOSIS — D61818 Other pancytopenia: Secondary | ICD-10-CM | POA: Diagnosis not present

## 2017-03-15 DIAGNOSIS — E785 Hyperlipidemia, unspecified: Secondary | ICD-10-CM | POA: Diagnosis present

## 2017-03-15 DIAGNOSIS — D702 Other drug-induced agranulocytosis: Secondary | ICD-10-CM

## 2017-03-15 DIAGNOSIS — Z79899 Other long term (current) drug therapy: Secondary | ICD-10-CM

## 2017-03-15 DIAGNOSIS — Z87891 Personal history of nicotine dependence: Secondary | ICD-10-CM

## 2017-03-15 HISTORY — DX: Thrombocytopenia, unspecified: D69.6

## 2017-03-15 HISTORY — DX: Neutropenia, unspecified: D70.9

## 2017-03-15 LAB — CBC WITH DIFFERENTIAL/PLATELET
BASO%: 0 % (ref 0.0–2.0)
Basophils Absolute: 0 10*3/uL (ref 0.0–0.1)
EOS ABS: 0 10*3/uL (ref 0.0–0.5)
EOS%: 2.5 % (ref 0.0–7.0)
HCT: 25.7 % — ABNORMAL LOW (ref 34.8–46.6)
HEMOGLOBIN: 8.6 g/dL — AB (ref 11.6–15.9)
LYMPH%: 42.5 % (ref 14.0–49.7)
MCH: 29.8 pg (ref 25.1–34.0)
MCHC: 33.5 g/dL (ref 31.5–36.0)
MCV: 88.9 fL (ref 79.5–101.0)
MONO#: 0 10*3/uL — AB (ref 0.1–0.9)
MONO%: 2.5 % (ref 0.0–14.0)
NEUT%: 52.5 % (ref 38.4–76.8)
NEUTROS ABS: 0.2 10*3/uL — AB (ref 1.5–6.5)
Platelets: 17 10*3/uL — ABNORMAL LOW (ref 145–400)
RBC: 2.89 10*6/uL — ABNORMAL LOW (ref 3.70–5.45)
RDW: 13.4 % (ref 11.2–14.5)
WBC: 0.4 10*3/uL — AB (ref 3.9–10.3)
lymph#: 0.2 10*3/uL — ABNORMAL LOW (ref 0.9–3.3)

## 2017-03-15 LAB — COMPREHENSIVE METABOLIC PANEL
ALBUMIN: 2.7 g/dL — AB (ref 3.5–5.0)
ALK PHOS: 71 U/L (ref 40–150)
ALT: 40 U/L (ref 0–55)
AST: 46 U/L — ABNORMAL HIGH (ref 5–34)
Anion Gap: 10 mEq/L (ref 3–11)
BUN: 41.1 mg/dL — AB (ref 7.0–26.0)
CO2: 26 mEq/L (ref 22–29)
Calcium: 9.6 mg/dL (ref 8.4–10.4)
Chloride: 104 mEq/L (ref 98–109)
Creatinine: 2 mg/dL — ABNORMAL HIGH (ref 0.6–1.1)
EGFR: 23 mL/min/{1.73_m2} — AB (ref >90)
GLUCOSE: 105 mg/dL (ref 70–140)
Potassium: 4.6 mEq/L (ref 3.5–5.1)
SODIUM: 140 meq/L (ref 136–145)
TOTAL PROTEIN: 7.2 g/dL (ref 6.4–8.3)
Total Bilirubin: 0.71 mg/dL (ref 0.20–1.20)

## 2017-03-15 LAB — URINALYSIS, ROUTINE W REFLEX MICROSCOPIC
BILIRUBIN URINE: NEGATIVE
GLUCOSE, UA: NEGATIVE mg/dL
Ketones, ur: NEGATIVE mg/dL
Leukocytes, UA: NEGATIVE
NITRITE: NEGATIVE
PH: 5 (ref 5.0–8.0)
Protein, ur: NEGATIVE mg/dL
SPECIFIC GRAVITY, URINE: 1.006 (ref 1.005–1.030)

## 2017-03-15 MED ORDER — PANTOPRAZOLE SODIUM 40 MG PO TBEC
40.0000 mg | DELAYED_RELEASE_TABLET | Freq: Every day | ORAL | Status: DC
  Administered 2017-03-16 – 2017-03-21 (×6): 40 mg via ORAL
  Filled 2017-03-15 (×6): qty 1

## 2017-03-15 MED ORDER — FLUTICASONE FUROATE-VILANTEROL 100-25 MCG/INH IN AEPB
1.0000 | INHALATION_SPRAY | Freq: Every day | RESPIRATORY_TRACT | Status: DC
  Administered 2017-03-16 – 2017-03-18 (×3): 1 via RESPIRATORY_TRACT
  Filled 2017-03-15: qty 28

## 2017-03-15 MED ORDER — SODIUM CHLORIDE 0.9 % IV SOLN
INTRAVENOUS | Status: DC
  Administered 2017-03-15 – 2017-03-19 (×6): via INTRAVENOUS

## 2017-03-15 MED ORDER — SODIUM CHLORIDE 0.9 % IV SOLN: 250.0000 mL | Freq: Once | INTRAVENOUS | Status: DC

## 2017-03-15 MED ORDER — ATORVASTATIN CALCIUM 40 MG PO TABS
80.0000 mg | ORAL_TABLET | Freq: Every day | ORAL | Status: DC
  Administered 2017-03-16 – 2017-03-20 (×5): 80 mg via ORAL
  Filled 2017-03-15 (×5): qty 2

## 2017-03-15 MED ORDER — VITAMIN D 1000 UNITS PO TABS
1000.0000 [IU] | ORAL_TABLET | Freq: Every day | ORAL | Status: DC
  Administered 2017-03-16 – 2017-03-21 (×6): 1000 [IU] via ORAL
  Filled 2017-03-15 (×6): qty 1

## 2017-03-15 MED ORDER — BUDESONIDE 0.5 MG/2ML IN SUSP: 0.5000 mg | Freq: Two times a day (BID) | RESPIRATORY_TRACT | Status: DC

## 2017-03-15 MED ORDER — ONDANSETRON HCL 4 MG PO TABS: 4.0000 mg | ORAL_TABLET | Freq: Four times a day (QID) | ORAL | Status: DC | PRN

## 2017-03-15 MED ORDER — ONDANSETRON HCL 4 MG/2ML IJ SOLN: 4.0000 mg | Freq: Four times a day (QID) | INTRAMUSCULAR | Status: DC | PRN

## 2017-03-15 MED ORDER — SODIUM CHLORIDE 0.9% FLUSH: 3.0000 mL | INTRAVENOUS | Status: DC | PRN

## 2017-03-15 MED ORDER — IPRATROPIUM-ALBUTEROL 0.5-2.5 (3) MG/3ML IN SOLN
3.0000 mL | Freq: Four times a day (QID) | RESPIRATORY_TRACT | Status: DC | PRN
  Administered 2017-03-18: 3 mL via RESPIRATORY_TRACT
  Filled 2017-03-15: qty 3

## 2017-03-15 MED ORDER — DEXTROMETHORPHAN POLISTIREX ER 30 MG/5ML PO SUER
60.0000 mg | Freq: Two times a day (BID) | ORAL | Status: DC | PRN
  Filled 2017-03-15 (×2): qty 10

## 2017-03-15 MED ORDER — ENSURE ENLIVE PO LIQD
237.0000 mL | Freq: Two times a day (BID) | ORAL | Status: DC
  Administered 2017-03-16: 237 mL via ORAL

## 2017-03-15 MED ORDER — LEVOTHYROXINE SODIUM 25 MCG PO TABS
125.0000 ug | ORAL_TABLET | Freq: Every day | ORAL | Status: DC
  Administered 2017-03-16 – 2017-03-21 (×6): 125 ug via ORAL
  Filled 2017-03-15 (×6): qty 1

## 2017-03-15 MED ORDER — MAGIC MOUTHWASH W/LIDOCAINE
5.0000 mL | Freq: Four times a day (QID) | ORAL | Status: DC
  Administered 2017-03-15 – 2017-03-21 (×24): 5 mL via ORAL
  Filled 2017-03-15 (×26): qty 5

## 2017-03-15 MED ORDER — TBO-FILGRASTIM 300 MCG/0.5ML ~~LOC~~ SOSY
300.0000 ug | PREFILLED_SYRINGE | Freq: Every day | SUBCUTANEOUS | Status: DC
  Administered 2017-03-15 – 2017-03-18 (×4): 300 ug via SUBCUTANEOUS
  Filled 2017-03-15 (×4): qty 0.5

## 2017-03-15 MED ORDER — ADULT MULTIVITAMIN W/MINERALS CH
1.0000 | ORAL_TABLET | Freq: Every day | ORAL | Status: DC
  Administered 2017-03-16 – 2017-03-21 (×6): 1 via ORAL
  Filled 2017-03-15 (×6): qty 1

## 2017-03-15 MED ORDER — NYSTATIN 100000 UNIT/ML MT SUSP
5.0000 mL | Freq: Four times a day (QID) | OROMUCOSAL | Status: DC
  Administered 2017-03-15 – 2017-03-21 (×24): 500000 [IU] via ORAL
  Filled 2017-03-15 (×24): qty 5

## 2017-03-15 MED ORDER — ACETAMINOPHEN 325 MG PO TABS
650.0000 mg | ORAL_TABLET | Freq: Once | ORAL | Status: AC
  Administered 2017-03-15: 650 mg via ORAL
  Filled 2017-03-15: qty 2

## 2017-03-15 MED ORDER — ACETAMINOPHEN 650 MG RE SUPP: 650.0000 mg | Freq: Four times a day (QID) | RECTAL | Status: DC | PRN

## 2017-03-15 MED ORDER — SODIUM CHLORIDE 0.9% FLUSH
3.0000 mL | Freq: Two times a day (BID) | INTRAVENOUS | Status: DC
  Administered 2017-03-17 – 2017-03-21 (×5): 3 mL via INTRAVENOUS

## 2017-03-15 MED ORDER — DIPHENHYDRAMINE HCL 25 MG PO CAPS
25.0000 mg | ORAL_CAPSULE | Freq: Once | ORAL | Status: AC
  Administered 2017-03-15: 25 mg via ORAL
  Filled 2017-03-15: qty 1

## 2017-03-15 MED ORDER — SODIUM CHLORIDE 0.9 % IV SOLN: Freq: Once | INTRAVENOUS | Status: DC

## 2017-03-15 MED ORDER — ACETAMINOPHEN 325 MG PO TABS
650.0000 mg | ORAL_TABLET | Freq: Four times a day (QID) | ORAL | Status: DC | PRN
  Administered 2017-03-18 – 2017-03-19 (×2): 650 mg via ORAL
  Filled 2017-03-15 (×2): qty 2

## 2017-03-15 MED ORDER — AMLODIPINE BESYLATE 5 MG PO TABS
5.0000 mg | ORAL_TABLET | Freq: Every day | ORAL | Status: DC
  Administered 2017-03-16 – 2017-03-21 (×6): 5 mg via ORAL
  Filled 2017-03-15 (×6): qty 1

## 2017-03-15 MED ORDER — DEXTROSE 5 % IV SOLN
2.0000 g | INTRAVENOUS | Status: DC
  Administered 2017-03-15 – 2017-03-20 (×6): 2 g via INTRAVENOUS
  Filled 2017-03-15 (×6): qty 2

## 2017-03-15 MED ORDER — METOPROLOL SUCCINATE ER 50 MG PO TB24
50.0000 mg | ORAL_TABLET | Freq: Two times a day (BID) | ORAL | Status: DC
  Administered 2017-03-15 – 2017-03-21 (×12): 50 mg via ORAL
  Filled 2017-03-15 (×12): qty 1

## 2017-03-15 NOTE — H&P (Signed)
History and Physical    Renee Vance RFF:[id number] DOB: 27-May-1941 DOA: 03/15/2017  Referring MD/NP/PA: Direct admission from Dr. Julien Nordmann  PCP: [name], PA-C   Outpatient Specialists: Oncology, Dr. Julien Nordmann  Patient coming from: Home  Chief Complaint: Fever, not feeling well  HPI: Renee Vance is a 76 y.o. female with medical history significant for lung cancer, undergoing chemotherapy under Dr. Worthy Flank care, has received chemotherapy with gemcitabine 03/08/2017, hypertension, atrial fibrillation, just recently her Coumadin was stopped because of epistaxis, hypothyroidism. Patient presented to [facility] and was found to be severely neutropenic, had thrombocytopenia with platelet count of 17. She also had reports of hemoptysis at home and fever of 100.69F. Patient reports that ever since she started new chemotherapy about a few weeks ago she hasn't been feeling well. She developed sores in the mouth and had poor by mouth intake. No reports of chest pain or shortness of breath or palpitations. No abdominal pain, nausea or vomiting.  ED Course: Please note that this is a direct admission. Chest x-rays pending and urinalysis is pending. We obtained urine culture and blood culture results as patient is immunocompromised, recently received chemotherapy and is at high risk for infectious cause of neutropenia. Patient started on empiric cefepime.  Review of Systems:  Constitutional: Positive for fever, no chills, diaphoresis, activity change, appetite change and fatigue.  HENT: Negative for ear pain, congestion, facial swelling, rhinorrhea, neck pain, neck stiffness and ear discharge.   positive for hemoptysis and epistaxis Eyes: Negative for pain, discharge, redness, itching and visual disturbance.  Respiratory: Negative for cough, choking, chest tightness, shortness of breath, wheezing and stridor.   Cardiovascular: Negative for chest pain, palpitations and leg  swelling.  Gastrointestinal: Negative for abdominal distention.  Genitourinary: Negative for dysuria, urgency, frequency, hematuria, flank pain, decreased urine volume, difficulty urinating and dyspareunia.  Musculoskeletal: Negative for back pain, joint swelling, arthralgias and gait problem.  Neurological: Negative for dizziness, tremors, seizures, syncope, facial asymmetry, speech difficulty, weakness, light-headedness, numbness and headaches.  Hematological: Negative for adenopathy. Does not bruise/bleed easily.  Psychiatric/Behavioral: Negative for hallucinations, behavioral problems, confusion, dysphoric mood, decreased concentration and agitation.   Past Medical History:  Diagnosis Date  . 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 Bradycardia   . CAD (coronary artery disease)   . COPD (chronic obstructive pulmonary disease) ([place])   . Coronary atherosclerosis of native coronary artery 2011  . Encounter for antineoplastic chemotherapy 06/23/2016  . Fibrocystic breast   . History of radiation therapy 07/05/16 - 08/09/16    Left Lung: 45 Gy in 25 fractions  . HTN (hypertension)   . Hyperlipidemia   . Hypothyroidism   . Neutropenic fever ([place]) 03/15/2017  . Obesity   . Pain in joint   . Right renal mass 11/30/2016  . 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 Thrombocytopenia ([place]) 03/15/2017    Past Surgical History:  Procedure Laterality Date  . CHOLECYSTECTOMY    . CORONARY ANGIOPLASTY WITH STENT PLACEMENT  2011   [place] 30-40%, LAD 65-75% (FFR 0.88), CFX 55-60%, RCA 95%>0 w/ 2.5 x 12 mm monorail stent  . TUBAL LIGATION    . VIDEO BRONCHOSCOPY Bilateral 06/07/2016   Procedure: VIDEO BRONCHOSCOPY WITH FLUORO;  Surgeon: [name], MD;  Location: [facility] ENDOSCOPY;  Service: Cardiopulmonary;  Laterality: Bilateral;    Social history:  reports that she quit smoking about 2 years ago. She has a 30.00 pack-year smoking history. She has never  used  smokeless tobacco. She reports that she does not drink alcohol or use drugs.  Ambulation: Ambulates without assistance.  Allergies  Allergen Reactions  . Ampicillin Nausea And Vomiting    Has patient had a PCN reaction causing immediate rash, facial/tongue/throat swelling, SOB or lightheadedness with hypotension: no Has patient had a PCN reaction causing severe rash involving mucus membranes or skin necrosis: no Has patient had a PCN reaction that required hospitalization: no Has patient had a PCN reaction occurring within the last 10 years: no If all of the above answers are "NO", then may proceed with Cephalosporin use  . Codeine Nausea And Vomiting    Family History  Problem Relation Age of Onset  . Heart disease Mother     Prior to Admission medications   Medication Sig Start Date End Date Taking? Authorizing Provider  amLODipine (NORVASC) 5 MG tablet Take 5 mg by mouth daily.    [provider]  aspirin 81 MG tablet Take 81 mg by mouth daily.    [provider]  atorvastatin (LIPITOR) 80 MG tablet Take 80 mg by mouth daily.    [provider]  budesonide (PULMICORT) 0.5 MG/2ML nebulizer solution Take 2 mLs (0.5 mg total) by nebulization 2 (two) times daily. 11/04/16   Mariel Aloe, MD  dextromethorphan (DELSYM) 30 MG/5ML liquid Take 60 mg by mouth 2 (two) times daily as needed.     [provider]  fluticasone furoate-vilanterol (BREO ELLIPTA) 100-25 MCG/INH AEPB Inhale 1 puff into the lungs daily. 02/13/17   [name], MD  furosemide (LASIX) 20 MG tablet Take 20 mg by mouth daily.  12/13/16   [provider]  ipratropium-albuterol (DUONEB) 0.5-2.5 (3) MG/3ML SOLN Take 3 mLs by nebulization every 6 (six) hours as needed. 11/04/16   Mariel Aloe, MD  levothyroxine (SYNTHROID, LEVOTHROID) 125 MCG tablet Take 125 mcg by mouth daily before breakfast.  02/26/15   [provider]  metoprolol succinate (TOPROL-XL) 50 MG 24 hr  tablet Take 1 tablet (50 mg total) by mouth 2 (two) times daily. 01/06/16   Belva Crome, MD  Multiple Vitamins-Minerals (MULTIVITAMIN WITH MINERALS) tablet Take 1 tablet by mouth daily.    [provider]  omeprazole (PRILOSEC) 20 MG capsule Take 1 capsule (20 mg total) by mouth daily. 02/13/17   [name], MD  OXYGEN Inhale 2 L into the lungs continuous.    [provider]  Vitamin D, Cholecalciferol, 1000 UNITS CAPS Take 1,000 Units by mouth daily.  08/25/14   [provider]  warfarin (COUMADIN) 5 MG tablet Take 0.5-1 tablets (2.5-5 mg total) by mouth one time only at 6 PM. Take 2.5mg  MWF and take 5mg  every other day of the week 03/08/17   Theodis Blaze, MD    Physical Exam: Vitals:   03/15/17 1634  BP: (!) 127/52  Pulse: 71  Temp: 98.6 F (37 C)  TempSrc: Oral  SpO2: 90%    Constitutional: NAD, calm, comfortable Vitals:   03/15/17 1634  BP: (!) 127/52  Pulse: 71  Temp: 98.6 F (37 C)  TempSrc: Oral  SpO2: 90%   Eyes: PERRL, lids and conjunctivae normal ENMT: Mucous membranes are moist. Posterior pharynx clear of any exudate or lesions.Normal dentition.  Neck: normal, supple, no masses, no thyromegaly Respiratory: clear to auscultation bilaterally, no wheezing, no crackles. Normal respiratory effort. No accessory muscle use.  Cardiovascular: Regular rate and rhythm, no murmurs / rubs / gallops. No extremity  edema. 2+ pedal pulses. No carotid bruits.  Abdomen: no tenderness, no masses palpated. No hepatosplenomegaly. Bowel sounds positive.  Musculoskeletal: no clubbing / cyanosis. No joint deformity upper and lower extremities. Good ROM, no contractures. Normal muscle tone.  Skin: no rashes, lesions, ulcers. No induration Neurologic: CN 2-12 grossly intact. Sensation intact, DTR normal. Strength 5/5 in all 4.  Psychiatric: Normal judgment and insight. Alert and oriented x 3. Normal mood.    Labs on Admission: I have personally reviewed  following labs and imaging studies  CBC:  Recent Labs Lab 03/15/17 1244  WBC 0.4*  NEUTROABS 0.2*  HGB 8.6*  HCT 25.7*  MCV 88.9  PLT 17*   Basic Metabolic Panel:  Recent Labs Lab 03/15/17 1244  NA 140  K 4.6  CO2 26  GLUCOSE 105  BUN 41.1*  CREATININE 2.0*  CALCIUM 9.6   GFR: Estimated Creatinine Clearance: 22.1 mL/min (A) (by C-G formula based on SCr of 2 mg/dL (H)). Liver Function Tests:  Recent Labs Lab 03/15/17 1244  AST 46*  ALT 40  ALKPHOS 71  BILITOT 0.71  PROT 7.2  ALBUMIN 2.7*   No results for input(s): LIPASE, AMYLASE in the last 168 hours. No results for input(s): AMMONIA in the last 168 hours. Coagulation Profile:  Recent Labs Lab 03/09/17 1458  INR 1.0   Cardiac Enzymes: No results for input(s): CKTOTAL, CKMB, CKMBINDEX, TROPONINI in the last 168 hours. BNP (last 3 results) No results for input(s): PROBNP in the last 8760 hours. HbA1C: No results for input(s): HGBA1C in the last 72 hours. CBG: No results for input(s): GLUCAP in the last 168 hours. Lipid Profile: No results for input(s): CHOL, HDL, LDLCALC, TRIG, CHOLHDL, LDLDIRECT in the last 72 hours. Thyroid Function Tests: No results for input(s): TSH, T4TOTAL, FREET4, T3FREE, THYROIDAB in the last 72 hours. Anemia Panel: No results for input(s): VITAMINB12, FOLATE, FERRITIN, TIBC, IRON, RETICCTPCT in the last 72 hours. Urine analysis:    Component Value Date/Time   COLORURINE YELLOW 10/28/2016 1649   APPEARANCEUR CLEAR 10/28/2016 1649   LABSPEC 1.004 (L) 10/28/2016 1649   PHURINE 5.0 10/28/2016 1649   GLUCOSEU NEGATIVE 10/28/2016 1649   HGBUR LARGE (A) 10/28/2016 1649   BILIRUBINUR NEGATIVE 10/28/2016 1649   KETONESUR NEGATIVE 10/28/2016 1649   PROTEINUR 30 (A) 10/28/2016 1649   NITRITE NEGATIVE 10/28/2016 1649   LEUKOCYTESUR TRACE (A) 10/28/2016 1649   Sepsis Labs: @LABRCNTIP (procalcitonin:4,lacticidven:4) )No results found for this or any previous visit (from the  past 240 hour(s)).   Radiological Exams on Admission: No results found.  EKG: Pending   Assessment/Plan  Principal Problem:  Febrile neutropenia - Patient presented with severe neutropenia in the setting of recent chemotherapy infusion - Patient will be started on empiric cefepime until we get results of urinalysis, urine culture, blood cultures and chest x-ray - I spoke with Dr. Julien Nordmann who requested the transfer to hospital and he recommended Granix daily until white blood cell count recovers  Active Problems:   Mucositis / hemoptysis - Likely sequela of chemotherapy - Started nystatin swish and swallow and Magic mouthwash with lidocaine - Had an episode of hemoptysis at home but this was the only episode and she didn't have any issues since then    HTN (hypertension), essential - Continue Norvasc 5 mg daily and metoprolol 50 mg 2 times daily    Coronary atherosclerosis of native coronary artery - Aspirin on hold due to thrombocytopenia    Hypothyroidism - Continue Synthroid    COPD (  chronic obstructive pulmonary disease) (HCC) / Chronic respiratory failure with hypoxia (HCC) - Stable respiratory status - Continue bronchodilator as needed    PAF (paroxysmal atrial fibrillation) (HCC) / Chronic anticoagulation - CHADS vasc score at least 3 - Taken off of anticoagulation just this past Monday 2 days ago - Currently not on anticoagulation secondary to risk of bleeding and thrombocytopenia - Rate controlled with metoprolol    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 Julien Nordmann - Has received chemotherapy with gemcitabine and 03/08/2017, did not receive Neulasta as this chemotherapy does not require Neulasta    Antineoplastic chemotherapy induced anemia - Continue to monitor hemoglobin daily    Acute renal failure superimposed on stage 4 chronic kidney disease (HCC) - Baseline creatinine 1.9 - Creatinine on this admission 2.0 - We will start  IV fluids and follow-up BMP tomorrow morning    Thrombocytopenia (HCC) - Secondary to sequela of chemotherapy - We will give 1 unit of platelets - Monitor for bleeding - Follow-up BMP tomorrow morning    DVT prophylaxis: Anticoagulation on hold due to thrombocytopenia, order placed for SCDs Code Status: Full code Family Communication: husband at bedside Disposition Plan: Telemetry Consults called: None Admission status: Inpatient admission, please noted that this is a direct admission and we don't have a full workup but patient is at high risk of clinical deterioration considering recent chemotherapy and is now side effects of the chemotherapy including neutropenia, anemia and severe thrombocytopenia with risk of bleeding. Patient also requires IV cefepime empirically until blood culture, urine culture results are back. Patient also required chest x-ray for full workup of infectious etiology.   Leisa Lenz MD [facility] Pager [phone]  If 7PM-7AM, please contact night-coverage [email or web address] Password [email or web address]  03/15/2017, 4:59 PM

## 2017-03-15 NOTE — Telephone Encounter (Signed)
appts already scheduled per 6/6 los . LOS -Lab, chemotherapy and follow-up visit as previously scheduled. No additional appts added.

## 2017-03-15 NOTE — Telephone Encounter (Signed)
appt today for labs and [facility]. Pt notified.

## 2017-03-15 NOTE — Progress Notes (Signed)
Pharmacy Antibiotic Note  Renee Vance is a 76 y.o. female with metastatic NSCLC who was sent from the Cancer center to [facility] hospital on 03/15/17 for management of neutropenia and pancytopenia. To start cefepime for suspected sepsis.  - scr 2 (crcl~22)  Plan: - cefepime 2gm IV q24h - f/u renal function   _______________________________  Temp (24hrs), Avg:98.7 F (37.1 C), Min:98.6 F (37 C), Max:98.8 F (37.1 C)   Recent Labs Lab 03/15/17 1244  WBC 0.4*  CREATININE 2.0*    Estimated Creatinine Clearance: 22.1 mL/min (A) (by C-G formula based on SCr of 2 mg/dL (H)).    Allergies  Allergen Reactions  . Ampicillin Nausea And Vomiting    Has patient had a PCN reaction causing immediate rash, facial/tongue/throat swelling, SOB or lightheadedness with hypotension: no Has patient had a PCN reaction causing severe rash involving mucus membranes or skin necrosis: no Has patient had a PCN reaction that required hospitalization: no Has patient had a PCN reaction occurring within the last 10 years: no If all of the above answers are "NO", then may proceed with Cephalosporin use  . Codeine Nausea And Vomiting     Thank you for allowing pharmacy to be a part of this patient's care.  Lynelle Doctor 03/15/2017 5:02 PM

## 2017-03-15 NOTE — Progress Notes (Signed)
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  PRIOR THERAPY: Concurrent chemoradiation with weekly carboplatin for AUC of 2 and paclitaxel 45 MG/M2 started 07/05/2016, status post 5 cycles. Last cycle was given 08/01/2016.  CURRENT THERAPY: Systemic chemotherapy with carboplatin for AUC of 5 on day 1 and gemcitabine 1000 MG/M2 on days 1 and 8 every 3 weeks.  INTERVAL HISTORY: Renee Vance 76 y.o. female came to the clinic today for wake in visit accompanied by her husband. The patient is currently on systemic chemotherapy with carboplatin and gemcitabine started 2 weeks ago. She tolerated the treatment well but she called earlier today complaining of soreness in her mouth in addition to several episodes of epistaxis and low-grade fever at home, 100.4. She also has dysuria and she was seen by her primary care physician yesterday and had urinalysis performed but I don't have access to the results. She was on Coumadin for atrial fibrillation but this was discontinued recently by Dr. Tamala Julian. She denied having any current chest pain but has shortness of breath at baseline and increased with exertion with mild cough and intermittent hemoptysis. She denied having any nausea or vomiting, diarrhea or constipation. She has no weight loss or night sweats. She had repeat bloodwork performed earlier today that showed significant pancytopenia.   MEDICAL HISTORY: Past Medical History:  Diagnosis Date  . 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 Bradycardia   . CAD (coronary artery disease)   . COPD (chronic obstructive pulmonary disease) ([place])   . Coronary  atherosclerosis of native coronary artery 2011  . Encounter for antineoplastic chemotherapy 06/23/2016  . Fibrocystic breast   . History of radiation therapy 07/05/16 - 08/09/16    Left Lung: 45 Gy in 25 fractions  . HTN (hypertension)   . Hyperlipidemia   . Hypothyroidism   . Obesity   . Pain in joint   . Right renal mass 11/30/2016  . 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  MEDICATIONS:  Current Outpatient Prescriptions  Medication Sig Dispense Refill  . amLODipine (NORVASC) 5 MG tablet Take 5 mg by mouth daily.    Marland Kitchen aspirin 81 MG tablet Take 81 mg by mouth daily.    Marland Kitchen atorvastatin (LIPITOR) 80 MG tablet Take 80 mg by mouth daily.    . budesonide (PULMICORT) 0.5 MG/2ML nebulizer solution Take 2 mLs (0.5 mg total) by nebulization 2 (two) times daily. 2 mL 60  . dextromethorphan (DELSYM) 30 MG/5ML liquid Take 60 mg by mouth 2 (two) times daily as needed.     . fluticasone furoate-vilanterol (BREO ELLIPTA) 100-25 MCG/INH AEPB Inhale 1 puff into the lungs daily. (Patient not taking: Reported on 03/03/2017) 1 each 0  . furosemide (LASIX) 20 MG tablet Take 20 mg by mouth daily.     Marland Kitchen ipratropium-albuterol (DUONEB) 0.5-2.5 (3) MG/3ML SOLN Take 3 mLs by nebulization every 6 (six) hours as needed. 360 mL 0  . levothyroxine (SYNTHROID, LEVOTHROID) 125 MCG tablet Take 125 mcg by mouth daily before breakfast.     . metoprolol succinate (TOPROL-XL) 50 MG 24 hr tablet Take 1 tablet (50  mg total) by mouth 2 (two) times daily. 180 tablet 1  . Multiple Vitamins-Minerals (MULTIVITAMIN WITH MINERALS) tablet Take 1 tablet by mouth daily.    Marland Kitchen omeprazole (PRILOSEC) 20 MG capsule Take 1 capsule (20 mg total) by mouth daily. 30 capsule 2  . OXYGEN Inhale 2 L into the lungs continuous.    . Vitamin D, Cholecalciferol, 1000 UNITS CAPS Take 1,000 Units by mouth daily.     Marland Kitchen warfarin (COUMADIN) 5 MG tablet Take 0.5-1 tablets (2.5-5 mg  total) by mouth one time only at 6 PM. Take 2.5mg  MWF and take 5mg  every other day of the week     No current facility-administered medications for this visit.     SURGICAL HISTORY:  Past Surgical History:  Procedure Laterality Date  . CHOLECYSTECTOMY    . CORONARY ANGIOPLASTY WITH STENT PLACEMENT  2011   [place] 30-40%, LAD 65-75% (FFR 0.88), CFX 55-60%, RCA 95%>0 w/ 2.5 x 12 mm monorail stent  . TUBAL LIGATION    . VIDEO BRONCHOSCOPY Bilateral 06/07/2016   Procedure: VIDEO BRONCHOSCOPY WITH FLUORO;  Surgeon: [name], MD;  Location: [facility];  Service: Cardiopulmonary;  Laterality: Bilateral;    REVIEW OF SYSTEMS:  Constitutional: positive for fatigue and fevers Eyes: negative Ears, nose, mouth, throat, and face: positive for epistaxis Respiratory: positive for dyspnea on exertion and hemoptysis Cardiovascular: negative Gastrointestinal: negative Genitourinary:positive for dysuria Integument/breast: negative Hematologic/lymphatic: negative Musculoskeletal:positive for muscle weakness Neurological: negative Behavioral/Psych: negative Endocrine: negative Allergic/Immunologic: negative   PHYSICAL EXAMINATION: General appearance: alert, cooperative, fatigued and no distress Head: Normocephalic, without obvious abnormality, atraumatic Neck: no adenopathy, no JVD, supple, symmetrical, trachea midline and thyroid not enlarged, symmetric, no tenderness/mass/nodules Lymph nodes: Cervical, supraclavicular, and axillary nodes normal. Resp: rhonchi bilaterally and wheezes bilaterally Back: symmetric, no curvature. ROM normal. No CVA tenderness. Cardio: regular rate and rhythm, S1, S2 normal, no murmur, click, rub or gallop GI: soft, non-tender; bowel sounds normal; no masses,  no organomegaly Extremities: extremities normal, atraumatic, no cyanosis or edema Neurologic: Alert and oriented X 3, normal strength and tone. Normal symmetric reflexes. Normal coordination and gait  ECOG  PERFORMANCE STATUS: 1 - Symptomatic but completely ambulatory  Blood pressure (!) 94/43, pulse 73, temperature 98.8 F (37.1 C), temperature source Oral, resp. rate 18, height 5\' 4"  (1.626 m), weight 146 lb 6.4 oz (66.4 kg), SpO2 99 %.  LABORATORY DATA: Lab Results  Component Value Date   WBC 0.4 (LL) 03/15/2017   HGB 8.6 (L) 03/15/2017   HCT 25.7 (L) 03/15/2017   MCV 88.9 03/15/2017   PLT 17 (L) 03/15/2017      Chemistry      Component Value Date/Time   NA 136 03/08/2017 1419   K 4.1 03/08/2017 1419   CL 103 03/04/2017 0317   CO2 26 03/08/2017 1419   BUN 46.1 (H) 03/08/2017 1419   CREATININE 1.9 (H) 03/08/2017 1419      Component Value Date/Time   CALCIUM 9.5 03/08/2017 1419   ALKPHOS 82 03/08/2017 1419   AST 26 03/08/2017 1419   ALT 17 03/08/2017 1419   BILITOT 1.08 03/08/2017 1419       RADIOGRAPHIC STUDIES: Dg Chest 2 View  Result Date: 03/03/2017 CLINICAL DATA:  Hemoptysis and worsening shortness of breath EXAM: CHEST  2 VIEW COMPARISON:  CT chest 12/26/2016, radiograph 11/28/2016, 11/01/2016 FINDINGS: Small left pleural effusion or thickening. Diffuse fibrosis. Parenchymal pattern on the right appears unchanged radiographically. Left upper and lower lobe and perihilar interstitial and  alveolar opacity probably stable. Left perihilar region bronchiectasis. Slight worsened opacification at the left base. Stable cardiomediastinal silhouette with atherosclerosis. IMPRESSION: 1. Peripheral interstitial opacities in the right lung appear grossly unchanged compared to the most recent prior radiograph. 2. Bronchiectasis, interstitial and alveolar process in the left perihilar region also probably stable compared with prior chest CT from March 2018. 3. Slight increased density at the left lung base, cannot exclude superimposed pneumonia 4. Left pleural disease grossly stable Electronically Signed   By: Donavan Foil M.D.   On: 03/03/2017 14:18   Ct Chest Wo Contrast  Result  Date: 03/03/2017 CLINICAL DATA:  History of lung carcinoma with current hemoptysis EXAM: CT CHEST WITHOUT CONTRAST TECHNIQUE: Multidetector CT imaging of the chest was performed following the standard protocol without IV contrast. COMPARISON:  01/18/2017, 12/26/2016 FINDINGS: Cardiovascular: Somewhat limited due to the lack of IV contrast. Aortic calcifications are seen without aneurysmal dilatation. Coronary calcifications are noted. No significant cardiac enlargement is seen. Mediastinum/Nodes: The thoracic inlet is within normal limits. Scattered small lymph nodes are noted within the mediastinum. These are stable from the prior examination and were not shown to be hypermetabolic on recent PET-CT. The esophagus as visualized is within normal limits. Lungs/Pleura: Chronic fibrotic changes are again identified bilaterally. Diffuse bronchiectatic changes are seen throughout the left lung with peripheral fibrotic changes stable from the recent PET-CT. Left-sided pleural effusion is again identified and stable. Upper Abdomen: Gall bladder has been surgically removed. The left adrenal nodule seen previously is again identified. Musculoskeletal: Degenerative changes of the thoracic spine are seen. IMPRESSION: Stable left adrenal nodule. Stable chronic fibrotic changes in both lungs. Stable mediastinal lymph nodes are noted as well. Left pleural effusion. No definitive acute abnormality is seen. Electronically Signed   By: Inez Catalina M.D.   On: 03/03/2017 17:02    ASSESSMENT AND PLAN:  This is a very pleasant 76 years old white female with metastatic non-small cell lung cancer, initially diagnosed as unresectable a stage IIB squamous cell carcinoma status post a course of concurrent chemoradiation with weekly carboplatin and paclitaxel. She did not receive consolidation chemotherapy at that time because of her poor performance status and complication of the previous treatment. 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 The patient was started on systemic chemotherapy with carboplatin and gemcitabine status post 1 cycle. She has tolerated the treatment well but unfortunately she has significant pancytopenia after the last dose of her treatment. She also has low-grade fever at home. I had a lengthy discussion with the patient and her husband about her condition. I recommended for the patient to be admitted to [facility] for evaluation and management of her neutropenic fever as well as pancytopenia. For the thrombocytopenia, we will arrange for the patient to receive 1 unit of platelets. For the chemotherapy-induced anemia, she may benefit from 2 units of PRBCs transfusion during her admission. For the neutropenic fever, we will consider the patient for treatment with Granix 300 g subcutaneously on daily basis until her absolute neutrophil count is over 1000. She will also benefit from prophylactic treatment with Levaquin. For the hypotension and dehydration, will start the patient on IV hydration with normal saline. For the dysuria, I will repeat urinalysis and consider the patient for treatment with antibiotics as needed. I will see her back for follow-up visit after discharge from the hospital for reevaluation before resuming her systemic chemotherapy which may  need to be dose adjusted. The patient voices understanding of current disease status and treatment options and is in agreement with the current care plan. All questions were answered. The patient knows to call the clinic with any problems, questions or concerns. We can certainly see the patient much sooner if necessary.  Disclaimer: This note was dictated with voice recognition software. Similar sounding words can inadvertently be transcribed and may not be corrected upon review.

## 2017-03-15 NOTE — Addendum Note (Signed)
Addended by: Ardeen Garland on: 03/15/2017 10:47 AM   Modules accepted: Orders

## 2017-03-15 NOTE — Progress Notes (Unsigned)
Pt transported to admitting via wheelchair accompanied by husband.

## 2017-03-15 NOTE — Telephone Encounter (Signed)
Tongue and gums are very sore. She is using biotene. Burns and hurts when chews something hard. No extra redness to tongue, no white patches, not down throat. Started with new chemo,  carbo/gemzar 5/23 and gemzar 5/30.  Does have blood clots when blows nose, nose does drip blood at times. She choked on water this am and coughed up some blood - first time since hospitalization.   100.1, 100.2 fevers at night.   Next appt 6/13 lab/Lisa/inf

## 2017-03-15 NOTE — Progress Notes (Signed)
12 lead EKG completed. Paged results to MD for their review. Isyss Espinal A

## 2017-03-16 DIAGNOSIS — I480 Paroxysmal atrial fibrillation: Secondary | ICD-10-CM

## 2017-03-16 DIAGNOSIS — I10 Essential (primary) hypertension: Secondary | ICD-10-CM

## 2017-03-16 DIAGNOSIS — Z7901 Long term (current) use of anticoagulants: Secondary | ICD-10-CM

## 2017-03-16 DIAGNOSIS — N184 Chronic kidney disease, stage 4 (severe): Secondary | ICD-10-CM

## 2017-03-16 DIAGNOSIS — D709 Neutropenia, unspecified: Secondary | ICD-10-CM

## 2017-03-16 DIAGNOSIS — D6481 Anemia due to antineoplastic chemotherapy: Secondary | ICD-10-CM

## 2017-03-16 DIAGNOSIS — I25118 Atherosclerotic heart disease of native coronary artery with other forms of angina pectoris: Secondary | ICD-10-CM

## 2017-03-16 DIAGNOSIS — E038 Other specified hypothyroidism: Secondary | ICD-10-CM

## 2017-03-16 DIAGNOSIS — C3492 Malignant neoplasm of unspecified part of left bronchus or lung: Secondary | ICD-10-CM

## 2017-03-16 DIAGNOSIS — J449 Chronic obstructive pulmonary disease, unspecified: Secondary | ICD-10-CM

## 2017-03-16 DIAGNOSIS — D701 Agranulocytosis secondary to cancer chemotherapy: Principal | ICD-10-CM

## 2017-03-16 DIAGNOSIS — N179 Acute kidney failure, unspecified: Secondary | ICD-10-CM

## 2017-03-16 DIAGNOSIS — D649 Anemia, unspecified: Secondary | ICD-10-CM

## 2017-03-16 DIAGNOSIS — T451X5A Adverse effect of antineoplastic and immunosuppressive drugs, initial encounter: Secondary | ICD-10-CM

## 2017-03-16 DIAGNOSIS — R5081 Fever presenting with conditions classified elsewhere: Secondary | ICD-10-CM

## 2017-03-16 DIAGNOSIS — D696 Thrombocytopenia, unspecified: Secondary | ICD-10-CM

## 2017-03-16 LAB — BPAM PLATELET PHERESIS
Blood Product Expiration Date: 201806072016
ISSUE DATE / TIME: 201806062107
Unit Type and Rh: 2800

## 2017-03-16 LAB — PREPARE PLATELET PHERESIS: UNIT DIVISION: 0

## 2017-03-16 LAB — CBC
HEMATOCRIT: 22.2 % — AB (ref 36.0–46.0)
Hemoglobin: 7.6 g/dL — ABNORMAL LOW (ref 12.0–15.0)
MCH: 29.3 pg (ref 26.0–34.0)
MCHC: 34.2 g/dL (ref 30.0–36.0)
MCV: 85.7 fL (ref 78.0–100.0)
PLATELETS: 103 10*3/uL — AB (ref 150–400)
RBC: 2.59 MIL/uL — ABNORMAL LOW (ref 3.87–5.11)
RDW: 13.5 % (ref 11.5–15.5)
WBC: 0.6 10*3/uL — CL (ref 4.0–10.5)

## 2017-03-16 LAB — BASIC METABOLIC PANEL
Anion gap: 7 (ref 5–15)
BUN: 39 mg/dL — ABNORMAL HIGH (ref 6–20)
CHLORIDE: 106 mmol/L (ref 101–111)
CO2: 27 mmol/L (ref 22–32)
CREATININE: 1.99 mg/dL — AB (ref 0.44–1.00)
Calcium: 8.9 mg/dL (ref 8.9–10.3)
GFR calc Af Amer: 27 mL/min — ABNORMAL LOW (ref >60)
GFR calc non Af Amer: 23 mL/min — ABNORMAL LOW (ref >60)
GLUCOSE: 93 mg/dL (ref 65–99)
POTASSIUM: 5.1 mmol/L (ref 3.5–5.1)
Sodium: 140 mmol/L (ref 135–145)

## 2017-03-16 LAB — GLUCOSE, CAPILLARY: Glucose-Capillary: 87 mg/dL (ref 65–99)

## 2017-03-16 NOTE — Plan of Care (Signed)
Problem: Nutrition: Goal: Adequate nutrition will be maintained Outcome: Not Progressing Pt still experiencing painful sores in mouth causing poor intake.

## 2017-03-16 NOTE — Progress Notes (Signed)
Initial Nutrition Assessment  DOCUMENTATION CODES:   Not applicable  INTERVENTION:  - Continue Ensure Enlive po BID, each supplement provides 350 kcal and 20 grams of protein - Continue to encourage PO intakes of meals, supplements, and beverages as tolerated.  NUTRITION DIAGNOSIS:   Increased nutrient needs related to catabolic illness, cancer and cancer related treatments as evidenced by estimated needs.  GOAL:   Patient will meet greater than or equal to 90% of their needs  MONITOR:   PO intake, Weight trends, Labs  REASON FOR ASSESSMENT:   Malnutrition Screening Tool  ASSESSMENT:    76 y.o. female with medical history significant for lung cancer, undergoing chemotherapy under Dr. Worthy Flank care, has received chemotherapy with gemcitabine 03/08/2017, hypertension, atrial fibrillation, just recently her Coumadin was stopped because of epistaxis, hypothyroidism. Patient presented to [facility] and was found to be severely neutropenic, had thrombocytopenia with platelet count of 17. She also had reports of hemoptysis at home and fever of 100.49F. Patient reports that ever since she started new chemotherapy about a few weeks ago she hasn't been feeling well. She developed sores in the mouth and had poor by mouth intake.   Pt seen for MST. BMI indicates normal weight status. No intakes documented since admission. Pt states she ate 50% of cheese omelet and breakfast potatoes this AM. Visualized lunch tray with 100% completion of soup, jello, and Sprite. Pt reports that she has had a very good appetite until starting a new chemo drugs 2 weeks ago at which time she began developing mouth sores/thrush. This has led to decreased intakes d/t pain. She states pain is less intense when she is not wearing her bottom dentures and when she consumes very soft foods and liquids. She drank a full Ensure earlier today. She states that Oncologist is going to switch her off of this chemo  drug to a different one. She denies any nutrition related questions or concerns at this time.  Physical assessment shows no muscle and no fat wasting. Per chart review, pt has lost 10 lbs (6.6% body weight) in the past months; not significant for time frame.  Medications reviewed; 1000 units vitamin D/day, 125 mcg oral Synthroid/day, daily multivitamin with minerals, 40 mg oral Protonix/day, 5 mL oral Mycostatin QID.  Labs reviewed; BUN: 39 mg/dL, creatinine: 1.99 mg/dL, GFR: 23 mL/min.  IVF: NS @ 75 mL/hr.    Diet Order:  Diet regular Room service appropriate? Yes; Fluid consistency: Thin  Skin:  Reviewed, no issues  Last BM:  6/5  Height:   Ht Readings from Last 1 Encounters:  03/16/17 5\' 4"  (1.626 m)    Weight:   Wt Readings from Last 1 Encounters:  03/16/17 142 lb 13.7 oz (64.8 kg)    Ideal Body Weight:  54.54 kg  BMI:  Body mass index is 24.52 kg/m.  Estimated Nutritional Needs:   Kcal:  1945-2140 (30-33 kcal/kg)  Protein:  90-100 grams  Fluid:  >/= 2 L/day  EDUCATION NEEDS:   No education needs identified at this time    Jarome Matin, MS, RD, LDN, CNSC Inpatient Clinical Dietitian Pager # [phone] After hours/weekend pager # [phone]

## 2017-03-16 NOTE — Progress Notes (Signed)
PROGRESS NOTE    Renee Vance  YTK:[id number] DOB: 1941-10-05 DOA: 03/15/2017 PCP: [name], PA-C   Brief Narrative:  Renee Vance is a 76 y.o. female with medical history significant for lung cancer, undergoing chemotherapy under Dr. Worthy Flank care, has received chemotherapy with Gemcitabine and Carboplatin 03/08/2017, hypertension, atrial fibrillation, just recently her Coumadin was stopped because of epistaxis, hypothyroidism. She presented to [facility] and was found to be severely neutropenic, had thrombocytopenia with platelet count of 17. She also had reports of hemoptysis at home and fever of 100.54F. Patient reports that ever since she started new chemotherapy about a few weeks ago she hasn't been feeling well. She developed sores in the mouth and had poor by mouth intake. No reports of chest pain or shortness of breath or palpitations. No abdominal pain, nausea or vomiting. She was a direct admission for the Oncology Clinic and is being treated for Neutropenic Fever. She is slowly improving.   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)   Antineoplastic chemotherapy induced anemia   Chronic respiratory failure with hypoxia (HCC)   Acute renal failure superimposed on stage 4 chronic kidney disease (HCC)   Thrombocytopenia (HCC)  Febrile Neutropenia - Patient presented with severe neutropenia in the setting of recent chemotherapy infusion -Had fever of 100.4 at Oncology Office yesterday  -WBC went from 0.4 -> 0.6; ANC was not done this AM but will repeat CBC with Diff in AM  -Patient will be started on empiric Cefepime until we get results of  urinalysis, urine culture, blood cultures and chest x-ray -Urinalysis showed Rare Bacteria, Negative Leukocytes, Negative Nitrites, 0-5 WBC and Large Blood -Blood Cx x2 showed NGTD <24 hours -Urine Cx Pending  -CXR on Admission showed Slight increased interstitial and alveolar opacity in the left mid lung since the prior radiograph. Otherwise no significant interval changes. -C/w NS at 75 mL/hr -Dr. Charlies Silvers spoke with Dr. Julien Nordmann who requested the transfer to hospital; Dr. Julien Nordmann recommended Granix 300 mcg daily until white blood cell count recovers and ANC is >1000  Mucositis / Hemoptysis - Likely sequela of chemotherapy - Started Nystatin swish and swallow 500,000 Units po 4 times daily and Magic mouthwash with Lidocaine 5 mL 4 times a day - Had an episode of hemoptysis at home but this was the only episode and she didn't have any issues since then but now states she is having epistaxis   HTN (hypertension), Essential -Continue Norvasc 5 mg daily and Metoprolol 50 mg 2 times daily  Coronary atherosclerosis of native coronary artery -Aspirin on hold due to Thrombocytopenia; Will restart in AM -C/w Metoprolol XL 50 mg po BID Atorvastatin 80 mg po Daily  Hypothyroidism -Check TSH and Free T4 in AM -Continue Levothyroxine 125 mcg po Daily  COPD (chronic obstructive pulmonary disease) (HCC) / Chronic respiratory failure with hypoxia (HCC) -Stable respiratory status - Continue bronchodilators with DuoNeb 3 mL RT q6hprn for Wheezing and SOB as needed  PAF (paroxysmal atrial fibrillation) (HCC) / Chronic anticoagulation -CHADS vasc score at least 3 -Taken off of Anticoagulation with Coumadin just this past Monday (3 days ago) -Currently not on anticoagulation secondary to risk of bleeding and thrombocytopenia -  Rate controlled with Metoprolol -C/w Telemetry   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 [name] -Has received chemotherapy with Gemcitabine and Carboplatin on 03/08/2017, did not receive Neulasta as this chemotherapy does not require Neulasta -Follow up with Dr. Julien Nordmann after D/C for re-evaluation before resuming systemic chemotherapy which may need dose adjustment   Antineoplastic Chemotherapy Induced Anemia -Patient's Hb/Hct went from 8.6/25.7 -> 7.6/22.2 -If worsens more will transfuse patient with 2 units of pRBC's -Repeat CBC in AM and continue to monitor for S/Sx of Bleeding   Acute renal failure superimposed on stage 4 chronic kidney disease (HCC) -Baseline creatinine 1.9 -Creatinine on this admission 2.0 and now BUN/Cr is 39/1.99 -C/w IV fluids with NS at 75 mL/hr  -Repeat CMP in AM  Thrombocytopenia ([place]) -Secondary to sequela of chemotherapy -Transfused 1 unit of platelets -Platelet Count went from 17 -> 103 -Monitor for bleeding -Follow-up CBC in AM  DVT prophylaxis: SCDs given Thrombocytopenia  Code Status: FULL CODE Family Communication: No family present at bedside Disposition Plan: Remain Inpatient and Home in 1-2 days. Will get PT Evaluation   Consultants:   Dr. Julien Nordmann was contacted yesterday by Dr. Charlies Silvers   Procedures: None  Antimicrobials: Anti-infectives    Start     Dose/Rate Route Frequency Ordered Stop   03/15/17 1730  ceFEPIme (MAXIPIME) 2 g in dextrose 5 % 50 mL IVPB     2 g 100 mL/hr over 30 Minutes Intravenous Every 24 hours 03/15/17 1706       Subjective: Seen and examined and was doing ok. No nausea or vomiting. States she had some mild epistaxis but no mor hemoptysis. No CP or SOB.   Objective: Vitals:   03/15/17 2345 03/16/17 0441 03/16/17 0442 03/16/17 0701  BP: (!) 115/48 108/88    Pulse: (!) 59 67    Resp: (!) 22 20    Temp: 98.7 F (37.1 C) 98.1 F (36.7 C)    TempSrc: Oral Oral    SpO2: 100% 96%    Weight:   64.8 kg (142 lb 13.7 oz)   Height:     5\' 4"  (1.626 m)    Intake/Output Summary (Last 24 hours) at 03/16/17 0758 Last data filed at 03/16/17 0235  Gross per 24 hour  Intake             1012 ml  Output              200 ml  Net              812 ml   Filed Weights   03/16/17 0442  Weight: 64.8 kg (142 lb 13.7 oz)   Examination: Physical Exam:  Constitutional:  NAD and appears calm and comfortable Eyes: Lids and conjunctivae normal, sclerae anicteric  ENMT: External Ears, Nose appear normal. Grossly normal hearing. .  Neck: Appears normal, supple, no cervical masses, normal ROM, no appreciable thyromegaly, no JVD Respiratory: Clear to auscultation bilaterally, no wheezing, rales, rhonchi or crackles. Normal respiratory effort and patient is not tachypenic. No accessory muscle use.  Cardiovascular: RRR, no murmurs / rubs / gallops. S1 and S2 auscultated. No extremity edema.  Abdomen: Soft, non-tender, non-distended. No masses palpated. No appreciable hepatosplenomegaly. Bowel sounds positive x4.  GU: Deferred. Musculoskeletal: No clubbing / cyanosis of digits/nails. No joint deformity upper and lower extremities.  Skin: No rashes, lesions,  ulcers on limited skin evaluation. No induration; Warm and dry.  Neurologic: CN 2-12 grossly intact with no focal deficits. Romberg sign cerebellar reflexes not assessed.  Psychiatric: Normal judgment and insight. Alert and oriented x 3. Normal mood and appropriate affect.   Data Reviewed: I have personally reviewed following labs and imaging studies  CBC:  Recent Labs Lab 03/15/17 1244 03/16/17 0516  WBC 0.4* 0.6*  NEUTROABS 0.2*  --   HGB 8.6* 7.6*  HCT 25.7* 22.2*  MCV 88.9 85.7  PLT 17* 742*   Basic Metabolic Panel:  Recent Labs Lab 03/15/17 1244 03/16/17 0516  NA 140 140  K 4.6 5.1  CL  --  106  CO2 26 27  GLUCOSE 105 93  BUN 41.1* 39*  CREATININE 2.0* 1.99*  CALCIUM 9.6 8.9   GFR: Estimated Creatinine Clearance: 20.4 mL/min (A) (by C-G formula based on SCr  of 1.99 mg/dL (H)). Liver Function Tests:  Recent Labs Lab 03/15/17 1244  AST 46*  ALT 40  ALKPHOS 71  BILITOT 0.71  PROT 7.2  ALBUMIN 2.7*   No results for input(s): LIPASE, AMYLASE in the last 168 hours. No results for input(s): AMMONIA in the last 168 hours. Coagulation Profile:  Recent Labs Lab 03/09/17 1458  INR 1.0   Cardiac Enzymes: No results for input(s): CKTOTAL, CKMB, CKMBINDEX, TROPONINI in the last 168 hours. BNP (last 3 results) No results for input(s): PROBNP in the last 8760 hours. HbA1C: No results for input(s): HGBA1C in the last 72 hours. CBG:  Recent Labs Lab 03/16/17 0754  GLUCAP 87   Lipid Profile: No results for input(s): CHOL, HDL, LDLCALC, TRIG, CHOLHDL, LDLDIRECT in the last 72 hours. Thyroid Function Tests: No results for input(s): TSH, T4TOTAL, FREET4, T3FREE, THYROIDAB in the last 72 hours. Anemia Panel: No results for input(s): VITAMINB12, FOLATE, FERRITIN, TIBC, IRON, RETICCTPCT in the last 72 hours. Sepsis Labs: No results for input(s): PROCALCITON, LATICACIDVEN in the last 168 hours.  No results found for this or any previous visit (from the past 240 hour(s)).   Radiology Studies: Dg Chest Port 1 View  Result Date: 03/15/2017 CLINICAL DATA:  Neutropenia EXAM: PORTABLE CHEST 1 VIEW COMPARISON:  03/03/2017, FINDINGS: Right lung is grossly clear. Slight interval increase in left mid lung interstitial and alveolar opacity compared to prior radiograph. No large effusion. Mild shift of mediastinal contents to the left. Stable enlarged cardiomediastinal silhouette with atherosclerosis. No pneumothorax. IMPRESSION: Slight increased interstitial and alveolar opacity in the left mid lung since the prior radiograph. Otherwise no significant interval changes. Electronically Signed   By: Donavan Foil M.D.   On: 03/15/2017 22:03   Scheduled Meds: . amLODipine  5 mg Oral Daily  . atorvastatin  80 mg Oral q1800  . cholecalciferol  1,000 Units  Oral Daily  . feeding supplement (ENSURE ENLIVE)  237 mL Oral BID BM  . fluticasone furoate-vilanterol  1 puff Inhalation Daily  . levothyroxine  125 mcg Oral QAC breakfast  . magic mouthwash w/lidocaine  5 mL Oral QID  . metoprolol succinate  50 mg Oral BID  . multivitamin with minerals  1 tablet Oral Daily  . nystatin  5 mL Oral QID  . pantoprazole  40 mg Oral Daily  . sodium chloride flush  3 mL Intravenous Q12H  . Tbo-filgastrim (GRANIX) SQ  300 mcg Subcutaneous q1800   Continuous Infusions: . sodium chloride 75 mL/hr at 03/15/17 1848  . sodium chloride    . sodium chloride    .  ceFEPime (MAXIPIME) IV Stopped (03/15/17 1925)    LOS: 1 day   Kerney Elbe, DO [facility] Pager [phone]  If 7PM-7AM, please contact night-coverage [email or web address] Password [email or web address] 03/16/2017, 7:58 AM

## 2017-03-16 NOTE — Progress Notes (Signed)
Dr Alfredia Ferguson aware of WBC  Level this am.

## 2017-03-17 LAB — URINE CULTURE: CULTURE: NO GROWTH

## 2017-03-17 LAB — COMPREHENSIVE METABOLIC PANEL
ALBUMIN: 2.4 g/dL — AB (ref 3.5–5.0)
ALT: 34 U/L (ref 14–54)
AST: 35 U/L (ref 15–41)
Alkaline Phosphatase: 53 U/L (ref 38–126)
Anion gap: 7 (ref 5–15)
BUN: 33 mg/dL — AB (ref 6–20)
CHLORIDE: 110 mmol/L (ref 101–111)
CO2: 26 mmol/L (ref 22–32)
Calcium: 8.6 mg/dL — ABNORMAL LOW (ref 8.9–10.3)
Creatinine, Ser: 1.72 mg/dL — ABNORMAL HIGH (ref 0.44–1.00)
GFR calc Af Amer: 32 mL/min — ABNORMAL LOW (ref >60)
GFR, EST NON AFRICAN AMERICAN: 27 mL/min — AB (ref >60)
GLUCOSE: 92 mg/dL (ref 65–99)
POTASSIUM: 4 mmol/L (ref 3.5–5.1)
SODIUM: 143 mmol/L (ref 135–145)
Total Bilirubin: 0.4 mg/dL (ref 0.3–1.2)
Total Protein: 5.9 g/dL — ABNORMAL LOW (ref 6.5–8.1)

## 2017-03-17 LAB — CBC WITH DIFFERENTIAL/PLATELET
BASOS ABS: 0 10*3/uL (ref 0.0–0.1)
Basophils Relative: 0 %
Eosinophils Absolute: 0 10*3/uL (ref 0.0–0.7)
Eosinophils Relative: 1 %
HCT: 19.8 % — ABNORMAL LOW (ref 36.0–46.0)
Hemoglobin: 6.9 g/dL — CL (ref 12.0–15.0)
Lymphocytes Relative: 44 %
Lymphs Abs: 0.4 10*3/uL — ABNORMAL LOW (ref 0.7–4.0)
MCH: 30.4 pg (ref 26.0–34.0)
MCHC: 34.8 g/dL (ref 30.0–36.0)
MCV: 87.2 fL (ref 78.0–100.0)
MONO ABS: 0 10*3/uL — AB (ref 0.1–1.0)
Monocytes Relative: 3 %
Neutro Abs: 0.5 10*3/uL — ABNORMAL LOW (ref 1.7–7.7)
Neutrophils Relative %: 52 %
PLATELETS: 35 10*3/uL — AB (ref 150–400)
RBC: 2.27 MIL/uL — AB (ref 3.87–5.11)
RDW: 13.5 % (ref 11.5–15.5)
WBC: 0.9 10*3/uL — AB (ref 4.0–10.5)

## 2017-03-17 LAB — T4, FREE: FREE T4: 0.95 ng/dL (ref 0.61–1.12)

## 2017-03-17 LAB — TSH: TSH: 0.619 u[IU]/mL (ref 0.350–4.500)

## 2017-03-17 LAB — GLUCOSE, CAPILLARY: Glucose-Capillary: 91 mg/dL (ref 65–99)

## 2017-03-17 LAB — MAGNESIUM: MAGNESIUM: 1.6 mg/dL — AB (ref 1.7–2.4)

## 2017-03-17 LAB — PREPARE RBC (CROSSMATCH)

## 2017-03-17 LAB — PHOSPHORUS: Phosphorus: 2.8 mg/dL (ref 2.5–4.6)

## 2017-03-17 MED ORDER — MAGNESIUM SULFATE 2 GM/50ML IV SOLN
2.0000 g | Freq: Once | INTRAVENOUS | Status: AC
  Administered 2017-03-17: 2 g via INTRAVENOUS
  Filled 2017-03-17: qty 50

## 2017-03-17 MED ORDER — SODIUM CHLORIDE 0.9 % IV SOLN: Freq: Once | INTRAVENOUS | Status: DC

## 2017-03-17 NOTE — Progress Notes (Signed)
CRITICAL VALUE ALERT  Critical Value:  Hgb 6.9  Date & Time Notied:  03/17/17  0706  Provider Notified: Dr. Alfredia Ferguson  Orders Received/Actions taken:

## 2017-03-17 NOTE — Progress Notes (Signed)
Pharmacy Antibiotic Note  Renee Vance is a 76 y.o. female with metastatic NSCLC who was sent from the Cancer center to [facility] hospital on 03/15/17 for management of neutropenia and pancytopenia. Pharmacy to dose cefepime for suspected sepsis.  - scr 1.7 (crcl~87mls/min) - WBC 0.9 - afebrile  Plan: - continue cefepime 2gm IV q24h - f/u renal function  Height: 5\' 4"  (162.6 cm) Weight: 142 lb 3.2 oz (64.5 kg) IBW/kg (Calculated) : 54.7_______________________________  Temp (24hrs), Avg:98.7 F (37.1 C), Min:97.7 F (36.5 C), Max:99.4 F (37.4 C)   Recent Labs Lab 03/15/17 1244 03/16/17 0516 03/17/17 0542  WBC 0.4* 0.6* 0.9*  CREATININE 2.0* 1.99* 1.72*    Estimated Creatinine Clearance: 23.7 mL/min (A) (by C-G formula based on SCr of 1.72 mg/dL (H)).    Allergies  Allergen Reactions  . Ampicillin Nausea And Vomiting    Has patient had a PCN reaction causing immediate rash, facial/tongue/throat swelling, SOB or lightheadedness with hypotension: no Has patient had a PCN reaction causing severe rash involving mucus membranes or skin necrosis: no Has patient had a PCN reaction that required hospitalization: no Has patient had a PCN reaction occurring within the last 10 years: no If all of the above answers are "NO", then may proceed with Cephalosporin use  . Codeine Nausea And Vomiting     Thank you for allowing pharmacy to be a part of this patient's care.  Dolly Rias RPh 03/17/2017, 11:32 AM Pager [phone]

## 2017-03-17 NOTE — Progress Notes (Addendum)
PROGRESS NOTE    Renee Vance  LNL:[id number] DOB: December 30, 1940 DOA: 03/15/2017 PCP: [name], PA-C   Brief Narrative:  Renee Vance is a 76 y.o. female with medical history significant for lung cancer, undergoing chemotherapy under Dr. Worthy Flank care, has received chemotherapy with Gemcitabine and Carboplatin 03/08/2017, hypertension, atrial fibrillation, just recently her Coumadin was stopped because of epistaxis, hypothyroidism. She presented to [facility] and was found to be severely neutropenic, had thrombocytopenia with platelet count of 17. She also had reports of hemoptysis at home and fever of 100.51F. Patient reports that ever since she started new chemotherapy about a few weeks ago she hasn't been feeling well. She developed sores in the mouth and had poor by mouth intake. No reports of chest pain or shortness of breath or palpitations. No abdominal pain, nausea or vomiting. She was a direct admission for the Oncology Clinic and is being treated for Neutropenic Fever. She is slowly improving however this AM was found to have a low Hemoglobin so will be transfused 2 units of pRBC's. ANC was 500 this AM.   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)   Antineoplastic chemotherapy induced anemia   Chronic respiratory failure with hypoxia (HCC)   Acute renal failure superimposed on stage 4 chronic kidney disease (HCC)   Thrombocytopenia (HCC)  Febrile Neutropenia, slowly improving - Patient presented with severe neutropenia in the setting of recent chemotherapy infusion -Had fever of 100.4 at Oncology Office  -Has been afebrile for the last 24  hours -WBC went from 0.4 -> 0.6 -> 0.9; ANC this AM was 500 -C/w Empiric Cefepime for now -Urinalysis showed Rare Bacteria, Negative Leukocytes, Negative Nitrites, 0-5 WBC and Large Blood -Blood Cx x2 showed NGTD at 2 days -Urine Cx Showed No Growth -CXR on Admission showed Slight increased interstitial and alveolar opacity in the left mid lung since the prior radiograph. Otherwise no significant interval changes. -C/w NS but at 50 mL/hr instead of 75 mL/hr -Dr. Charlies Silvers spoke with Dr. Julien Nordmann who requested the transfer to hospital;  -Dr. Julien Nordmann recommended Granix 300 mcg daily until white blood cell count recovers and ANC is >1000 and will continue at this point.   Mucositis / Hemoptysis - Likely sequela of chemotherapy - C/w Nystatin swish and swallow 500,000 Units po 4 times daily and Magic mouthwash with Lidocaine 5 mL 4 times a day - Had an episode of hemoptysis at home but this was the only episode and she didn't have any issues since then but now states she is having epistaxis   HTN (hypertension), Essential - Continue Norvasc 5 mg daily and Metoprolol 50 mg 2 times daily  Coronary atherosclerosis of native coronary artery - Aspirin on hold due to Thrombocytopenia; Will restart when platelet count is better  - C/w Metoprolol XL 50 mg po BID Atorvastatin 80 mg po Daily  Hypomagnesemia -Patient's Mag Level this AM was 1.6 -Replete with 2 grams of IV Mag Sulfate -Continue to Monitor and Replete Mag Level as Necessary -Repeat Mag Level in AM  Hypothyroidism -Checked TSH and was 0.619 and Free T4 was 0.95 -Continue Levothyroxine 125 mcg po Daily  COPD (chronic obstructive pulmonary disease) (  [place]) / Chronic respiratory failure with hypoxia (HCC) - Stable respiratory status; C/w Supplemental O2 1 Liter via [place] - Continue bronchodilators with DuoNeb 3 mL RT q6hprn for Wheezing and SOB as needed  PAF (paroxysmal atrial fibrillation) (HCC) / Chronic anticoagulation - CHADS vasc  score at least 3 -Taken off of Anticoagulation with Coumadin just this past Monday (3 days ago) - Currently not on anticoagulation secondary to risk of bleeding and thrombocytopenia - Rate controlled with Metoprolol - C/w Telemetry   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 [name] -Has received chemotherapy with Gemcitabine and Carboplatin on 03/08/2017, did not receive Neulasta as this chemotherapy does not require Neulasta -Follow up with Dr. Julien Nordmann after D/C for re-evaluation before resuming systemic chemotherapy which may need dose adjustment   Antineoplastic Chemotherapy Induced Anemia -Patient's Hb/Hct went from 8.6/25.7 -> 7.6/22.2 -> 6.9/19.8 -Will transfuse patient with 2 units of pRBC's -Repeat CBC in AM and continue to monitor for S/Sx of Bleeding   Acute renal failure superimposed on stage 4 chronic kidney disease ([place]), improving  -Baseline creatinine 1.9 -Creatinine on this admission 2.0 and now BUN/Cr went from  39/1.99 -> 33/1.72 -C/w IV fluids with NS but at 50 mL/hr  -Repeat CMP in AM  Thrombocytopenia ([place]) -Secondary to sequela of chemotherapy -Transfused 1 unit of platelets; Will consider transfusing another pool of platelets if further decreases in AM -Platelet Count went from 17 -> 103 -> 35 -Monitor for bleeding -Follow-up CBC in AM  DVT prophylaxis: SCDs given Thrombocytopenia  Code Status: FULL CODE Family Communication: No family present at bedside Disposition Plan: Remain Inpatient and Home in 1-2 days. Will get PT Evaluation   Consultants:   Dr. Julien Nordmann was contacted yesterday by Dr. Charlies Silvers   Procedures: None  Antimicrobials: Anti-infectives    Start     Dose/Rate Route Frequency Ordered Stop   03/15/17 1730  ceFEPIme (MAXIPIME) 2 g in dextrose 5 % 50 mL IVPB     2 g 100 mL/hr over 30 Minutes  Intravenous Every 24 hours 03/15/17 1706       Subjective: Seen and examined and was a little fatigued. No nausea or vomiting and no more epistaxis or hemoptysis. No other concerns or complaints at this time.     Objective: Vitals:   03/17/17 1125 03/17/17 1325 03/17/17 1326 03/17/17 1355  BP: (!) 109/58 (!) 141/56 (!) 141/56 (!) 151/52  Pulse: 65 62 62 62  Resp: 18 16 18 16   Temp: 98.8 F (37.1 C) 98.7 F (37.1 C) 98.7 F (37.1 C) 97.8 F (36.6 C)  TempSrc: Oral Oral Oral Oral  SpO2: 100% 100% 100% 100%  Weight:      Height:        Intake/Output Summary (Last 24 hours) at 03/17/17 1527 Last data filed at 03/17/17 1325  Gross per 24 hour  Intake           1685.5 ml  Output             1800 ml  Net           -114.5 ml   Filed Weights   03/16/17 0442 03/17/17 0500  Weight: 64.8 kg (142 lb 13.7 oz) 64.5 kg (142 lb 3.2 oz)   Examination: Physical Exam:  Constitutional:  Pleasant Caucasian female in NAD appears calm and comfortable Eyes: Sclerae anicteric, lids normal  ENMT: Grossly normal hearing. Mucous membranes appeared slightly dry. External Ears and nose appear normal Neck: Supple with no JVD Respiratory: CTAB with no wheezing/rales/rhonchi. Patient was not tachypenic or using any accessory muscles to breathe Cardiovascular:  RRR, S1 S2 with slight 2/6 Systolic Murmur. Abdomen: Soft, NT, ND. Bowel Sounds positive. No appreciable organomegaly GU: Deferred Musculoskeletal: No Clubbing and no cyanosis Skin: Warm and Dry. No evidence of rashes or bruising on limited skin eval  Neurologic: CN 2-12 grossly intact with no focal deficits. Psychiatric: Pleasant mood and affect. Intact judgement and insight and is awake and alert.   Data Reviewed: I have personally reviewed following labs and imaging studies  CBC:  Recent Labs Lab 03/15/17 1244 03/16/17 0516 03/17/17 0542  WBC 0.4* 0.6* 0.9*  NEUTROABS 0.2*  --  0.5*  HGB 8.6* 7.6* 6.9*  HCT 25.7* 22.2* 19.8*  MCV  88.9 85.7 87.2  PLT 17* 103* 35*   Basic Metabolic Panel:  Recent Labs Lab 03/15/17 1244 03/16/17 0516 03/17/17 0542  NA 140 140 143  K 4.6 5.1 4.0  CL  --  106 110  CO2 26 27 26   GLUCOSE 105 93 92  BUN 41.1* 39* 33*  CREATININE 2.0* 1.99* 1.72*  CALCIUM 9.6 8.9 8.6*  MG  --   --  1.6*  PHOS  --   --  2.8   GFR: Estimated Creatinine Clearance: 23.7 mL/min (A) (by C-G formula based on SCr of 1.72 mg/dL (H)). Liver Function Tests:  Recent Labs Lab 03/15/17 1244 03/17/17 0542  AST 46* 35  ALT 40 34  ALKPHOS 71 53  BILITOT 0.71 0.4  PROT 7.2 5.9*  ALBUMIN 2.7* 2.4*   No results for input(s): LIPASE, AMYLASE in the last 168 hours. No results for input(s): AMMONIA in the last 168 hours. Coagulation Profile: No results for input(s): INR, PROTIME in the last 168 hours. Cardiac Enzymes: No results for input(s): CKTOTAL, CKMB, CKMBINDEX, TROPONINI in the last 168 hours. BNP (last 3 results) No results for input(s): PROBNP in the last 8760 hours. HbA1C: No results for input(s): HGBA1C in the last 72 hours. CBG:  Recent Labs Lab 03/16/17 0754 03/17/17 0739  GLUCAP 87 91   Lipid Profile: No results for input(s): CHOL, HDL, LDLCALC, TRIG, CHOLHDL, LDLDIRECT in the last 72 hours. Thyroid Function Tests:  Recent Labs  03/17/17 0542  TSH 0.619  FREET4 0.95   Anemia Panel: No results for input(s): VITAMINB12, FOLATE, FERRITIN, TIBC, IRON, RETICCTPCT in the last 72 hours. Sepsis Labs: No results for input(s): PROCALCITON, LATICACIDVEN in the last 168 hours.  Recent Results (from the past 240 hour(s))  Culture, Urine     Status: None   Collection Time: 03/15/17  4:56 PM  Result Value Ref Range Status   Specimen Description URINE, CLEAN CATCH  Final   Special Requests NONE  Final   Culture   Final    NO GROWTH Performed at [facility], [address] [place], [place] [address]    Report Status 03/17/2017 FINAL  Final  Culture, blood (routine x 2)      Status: None (Preliminary result)   Collection Time: 03/15/17  5:19 PM  Result Value Ref Range Status   Specimen Description RIGHT ANTECUBITAL  Final   Special Requests IN PEDIATRIC BOTTLE Blood Culture adequate volume  Final   Culture   Final    NO GROWTH 2 DAYS Performed at [facility], [place] [address] [place], [place] [address]  Report Status PENDING  Incomplete  Culture, blood (routine x 2)     Status: None (Preliminary result)   Collection Time: 03/15/17  5:23 PM  Result Value Ref Range Status   Specimen Description LEFT ANTECUBITAL  Final   Special Requests IN PEDIATRIC BOTTLE Blood Culture adequate volume  Final   Culture   Final    NO GROWTH 2 DAYS Performed at [facility], [place] [address] [place], [place] [address]    Report Status PENDING  Incomplete     Radiology Studies: Dg Chest Port 1 View  Result Date: 03/15/2017 CLINICAL DATA:  Neutropenia EXAM: PORTABLE CHEST 1 VIEW COMPARISON:  03/03/2017, FINDINGS: Right lung is grossly clear. Slight interval increase in left mid lung interstitial and alveolar opacity compared to prior radiograph. No large effusion. Mild shift of mediastinal contents to the left. Stable enlarged cardiomediastinal silhouette with atherosclerosis. No pneumothorax. IMPRESSION: Slight increased interstitial and alveolar opacity in the left mid lung since the prior radiograph. Otherwise no significant interval changes. Electronically Signed   By: Donavan Foil M.D.   On: 03/15/2017 22:03   Scheduled Meds: . amLODipine  5 mg Oral Daily  . atorvastatin  80 mg Oral q1800  . cholecalciferol  1,000 Units Oral Daily  . feeding supplement (ENSURE ENLIVE)  237 mL Oral BID BM  . fluticasone furoate-vilanterol  1 puff Inhalation Daily  . levothyroxine  125 mcg Oral QAC breakfast  . magic mouthwash w/lidocaine  5 mL Oral QID  . metoprolol succinate  50 mg Oral BID  . multivitamin with minerals  1 tablet Oral Daily  . nystatin  5 mL Oral QID  .  pantoprazole  40 mg Oral Daily  . sodium chloride flush  3 mL Intravenous Q12H  . Tbo-filgastrim (GRANIX) SQ  300 mcg Subcutaneous q1800   Continuous Infusions: . sodium chloride 75 mL/hr at 03/17/17 0320  . sodium chloride 10 mL/hr at 03/17/17 0803  . ceFEPime (MAXIPIME) IV Stopped (03/16/17 1755)    LOS: 2 days   Kerney Elbe, DO [facility] Pager [phone]  If 7PM-7AM, please contact night-coverage [email or web address] Password [facility] 03/17/2017, 3:27 PM

## 2017-03-18 DIAGNOSIS — J9611 Chronic respiratory failure with hypoxia: Secondary | ICD-10-CM

## 2017-03-18 LAB — COMPREHENSIVE METABOLIC PANEL
ALBUMIN: 2.6 g/dL — AB (ref 3.5–5.0)
ALK PHOS: 59 U/L (ref 38–126)
ALT: 31 U/L (ref 14–54)
AST: 35 U/L (ref 15–41)
Anion gap: 7 (ref 5–15)
BUN: 26 mg/dL — ABNORMAL HIGH (ref 6–20)
CALCIUM: 8.7 mg/dL — AB (ref 8.9–10.3)
CO2: 27 mmol/L (ref 22–32)
Chloride: 107 mmol/L (ref 101–111)
Creatinine, Ser: 1.62 mg/dL — ABNORMAL HIGH (ref 0.44–1.00)
GFR calc non Af Amer: 30 mL/min — ABNORMAL LOW (ref >60)
GFR, EST AFRICAN AMERICAN: 34 mL/min — AB (ref >60)
GLUCOSE: 102 mg/dL — AB (ref 65–99)
Potassium: 4.5 mmol/L (ref 3.5–5.1)
SODIUM: 141 mmol/L (ref 135–145)
TOTAL PROTEIN: 6.2 g/dL — AB (ref 6.5–8.1)
Total Bilirubin: 0.9 mg/dL (ref 0.3–1.2)

## 2017-03-18 LAB — BPAM RBC
Blood Product Expiration Date: 201806142359
Blood Product Expiration Date: 201806292359
ISSUE DATE / TIME: 201806081055
ISSUE DATE / TIME: 201806081328
UNIT TYPE AND RH: 1700
Unit Type and Rh: 1700

## 2017-03-18 LAB — TYPE AND SCREEN
ABO/RH(D): B NEG
Antibody Screen: NEGATIVE
UNIT DIVISION: 0
Unit division: 0

## 2017-03-18 LAB — CBC WITH DIFFERENTIAL/PLATELET
BASOS ABS: 0 10*3/uL (ref 0.0–0.1)
BASOS PCT: 0 %
EOS ABS: 0 10*3/uL (ref 0.0–0.7)
Eosinophils Relative: 1 %
HCT: 26.2 % — ABNORMAL LOW (ref 36.0–46.0)
HEMOGLOBIN: 8.9 g/dL — AB (ref 12.0–15.0)
LYMPHS ABS: 0.4 10*3/uL — AB (ref 0.7–4.0)
LYMPHS PCT: 37 %
MCH: 28.4 pg (ref 26.0–34.0)
MCHC: 34 g/dL (ref 30.0–36.0)
MCV: 83.7 fL (ref 78.0–100.0)
MONO ABS: 0 10*3/uL — AB (ref 0.1–1.0)
Monocytes Relative: 4 %
NEUTROS ABS: 0.6 10*3/uL — AB (ref 1.7–7.7)
Neutrophils Relative %: 58 %
Platelets: 23 10*3/uL — CL (ref 150–400)
RBC: 3.13 MIL/uL — ABNORMAL LOW (ref 3.87–5.11)
RDW: 14.8 % (ref 11.5–15.5)
WBC: 1 10*3/uL — CL (ref 4.0–10.5)

## 2017-03-18 LAB — GLUCOSE, CAPILLARY: GLUCOSE-CAPILLARY: 96 mg/dL (ref 65–99)

## 2017-03-18 LAB — MAGNESIUM: Magnesium: 1.9 mg/dL (ref 1.7–2.4)

## 2017-03-18 LAB — PHOSPHORUS: PHOSPHORUS: 2.7 mg/dL (ref 2.5–4.6)

## 2017-03-18 MED ORDER — SODIUM CHLORIDE 0.9 % IV SOLN: Freq: Once | INTRAVENOUS | Status: DC

## 2017-03-18 NOTE — Progress Notes (Signed)
PROGRESS NOTE    Renee Vance  WUJ:[id number] DOB: 1941/04/02 DOA: 03/15/2017 PCP: [name], PA-C   Brief Narrative:  Renee Vance is a 76 y.o. female with medical history significant for lung cancer, undergoing chemotherapy under Dr. Worthy Flank care, has received chemotherapy with Gemcitabine and Carboplatin 03/08/2017, hypertension, atrial fibrillation, just recently her Coumadin was stopped because of epistaxis, hypothyroidism. She presented to [facility] and was found to be severely neutropenic, had thrombocytopenia with platelet count of 17. She also had reports of hemoptysis at home and fever of 100.69F. Patient reports that ever since she started new chemotherapy about a few weeks ago she hasn't been feeling well. She developed sores in the mouth and had poor by mouth intake. No reports of chest pain or shortness of breath or palpitations. No abdominal pain, nausea or vomiting. She was a direct admission for the Oncology Clinic and is being treated for Neutropenic Fever. She is slowly improving however this AM was found to have a low Platelet count so will be transfused 1 units of plateletes. ANC was 600 this AM. Patient also endorsed some epistaxis yesterday but did not last long.   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)   Antineoplastic chemotherapy induced anemia   Chronic respiratory failure with hypoxia (HCC)   Acute renal failure superimposed on stage 4 chronic kidney disease (HCC)   Thrombocytopenia (HCC)  Febrile Neutropenia, slowly improving - Patient presented with severe neutropenia in the setting of recent chemotherapy  infusion -Had fever of 100.4 at Oncology Office  -Has been afebrile for the last 24 hours -WBC went from 0.4 -> 0.6 -> 0.9 -> 1.0 ; ANC this AM was 600 -C/w Empiric Cefepime for now -Urinalysis showed Rare Bacteria, Negative Leukocytes, Negative Nitrites, 0-5 WBC and Large Blood -Blood Cx x2 showed NGTD at 3 days -Urine Cx Showed No Growth -CXR on Admission showed Slight increased interstitial and alveolar opacity in the left mid lung since the prior radiograph. Otherwise no significant interval changes. -C/w NS but at 50 mL/hr instead of 75 mL/hr -Dr. Charlies Silvers spoke with Dr. Julien Nordmann who requested the transfer to hospital;  -Dr. Julien Nordmann recommended Granix 300 mcg daily until white blood cell count recovers and ANC is >1000 and will continue at this point.   Mucositis / Hemoptysis / Epistaxis - Likely sequela of chemotherapy - C/w Nystatin swish and swallow 500,000 Units po 4 times daily and Magic mouthwash with Lidocaine 5 mL 4 times a day - Had an episode of hemoptysis at home but this was the only episode and she didn't have any issues since then but now states she is having epistaxis intermittently  - Platelet Count was dropping so transfused 1 pool of platelets today.  - Continue to Monitor   HTN (hypertension), Essential - Continue Norvasc 5 mg daily and Metoprolol 50 mg 2 times daily  Coronary atherosclerosis of native coronary artery - Aspirin on hold due to Thrombocytopenia; Will restart when platelet count is better  - C/w Metoprolol XL 50 mg po BID Atorvastatin 80 mg po Daily  Hypomagnesemia -Patient's Mag Level this AM was 1.9 -Continue to Monitor and Replete Mag Level as Necessary -Repeat  Mag Level in AM  Hypothyroidism -Checked TSH and was 0.619 and Free T4 was 0.95 -Continue Levothyroxine 125 mcg po Daily  COPD (chronic obstructive pulmonary disease) (HCC) / Chronic respiratory failure with hypoxia (HCC) - Stable respiratory status; C/w Supplemental O2 1 Liter  via [place] - Continue bronchodilators with DuoNeb 3 mL RT q6hprn for Wheezing and SOB as needed  PAF (paroxysmal atrial fibrillation) (HCC) / Chronic anticoagulation - CHADS vasc score at least 3 -Taken off of Anticoagulation with Coumadin just this past Monday (3 days ago) - Currently not on anticoagulation secondary to risk of bleeding and thrombocytopenia - Rate controlled with Metoprolol - C/w Telemetry   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 [name] -Has received chemotherapy with Gemcitabine and Carboplatin on 03/08/2017, did not receive Neulasta as this chemotherapy does not require Neulasta -Follow up with Dr. Julien Nordmann after D/C for re-evaluation before resuming systemic chemotherapy which may need dose adjustment   Antineoplastic Chemotherapy Induced Anemia -Patient's Hb/Hct went from 8.6/25.7 -> 7.6/22.2 -> 6.9/19.8 -> 8.9/26.2 -S/p Transfusion of 2 units of pRBC's -Repeat CBC in AM and continue to monitor for S/Sx of Bleeding   Acute renal failure superimposed on stage 4 chronic kidney disease ([place]), improving  -Baseline creatinine 1.9 -Creatinine on this admission 2.0 and now BUN/Cr went from  39/1.99 -> 33/1.72 -> 26/1.62 -C/w IV fluids with NS but at 50 mL/hr  -Repeat CMP in AM  Thrombocytopenia ([place]) -Secondary to sequela of chemotherapy -Transfused 1 unit of platelets; Will transfuse 1 pool of platelets today -Platelet Count went from 17 -> 103 -> 35 -> 23 -Monitor for bleeding as patient has Epistaxis intermittently  -Follow-up CBC in AM  DVT prophylaxis: SCDs given Thrombocytopenia  Code Status: FULL CODE Family Communication: No family present at bedside Disposition Plan: Remain Inpatient and Home in 1-2 days. Will get PT Evaluation   Consultants:   Dr. Julien Nordmann was contacted yesterday by Dr.  Charlies Silvers   Procedures: None  Antimicrobials: Anti-infectives    Start     Dose/Rate Route Frequency Ordered Stop   03/15/17 1730  ceFEPIme (MAXIPIME) 2 g in dextrose 5 % 50 mL IVPB     2 g 100 mL/hr over 30 Minutes Intravenous Every 24 hours 03/15/17 1706       Subjective: Seen and examined and stated she had some epistaxis last night and one episode of hemoptysis (thinks blood from her nose went in her throat) however it resolved. No Nausea or Vomiting. Denied any CP or SOB. No other complaints or concerns at this time and was encouraged that numbers are slowly improving.   Objective: Vitals:   03/18/17 0458 03/18/17 0957 03/18/17 1200 03/18/17 1415  BP: (!) 151/68  127/60 (!) 148/50  Pulse: 71  (!) 59 67  Resp: 18  16 18   Temp: 99.5 F (37.5 C)  97.7 F (36.5 C) 97.9 F (36.6 C)  TempSrc: Oral  Oral Oral  SpO2:  92% 95% 97%  Weight: 66.6 kg (146 lb 13.2 oz)     Height:        Intake/Output Summary (Last 24 hours) at 03/18/17 1947 Last data filed at 03/18/17 1900  Gross per 24 hour  Intake             2530 ml  Output  0 ml  Net             2530 ml   Filed Weights   03/16/17 0442 03/17/17 0500 03/18/17 0458  Weight: 64.8 kg (142 lb 13.7 oz) 64.5 kg (142 lb 3.2 oz) 66.6 kg (146 lb 13.2 oz)   Examination: Physical Exam:  Constitutional:  Pleasant 76 yo Caucasian female who is in NAD and appears calm and comfortable.  Eyes: Sclerae anicteric; Conjunctivae non-injected ENMT: Grossly normal hearing and External Ears and Nose appear normal. Neck: Supple with No JVD Respiratory: CTAB. No wheezing/rales/rhonchi. Patient wearing 1 liter of O2 via .  Cardiovascular: RRR. No m/r/g Abdomen: Soft, NT, ND. Bowel sounds present GU: Deferred Musculoskeletal: No cyanosis or contractures Skin: Warm and dry. No rashes or lesions on a limited skin evaluation. Neurologic: CN 2-12 Grossly intact. No focal deficits.  Psychiatric: Pleasant mood and affect. Awake and  Alert. Intact Judgement and Insight   Data Reviewed: I have personally reviewed following labs and imaging studies  CBC:  Recent Labs Lab 03/15/17 1244 03/16/17 0516 03/17/17 0542 03/18/17 0539  WBC 0.4* 0.6* 0.9* 1.0*  NEUTROABS 0.2*  --  0.5* 0.6*  HGB 8.6* 7.6* 6.9* 8.9*  HCT 25.7* 22.2* 19.8* 26.2*  MCV 88.9 85.7 87.2 83.7  PLT 17* 103* 35* 23*   Basic Metabolic Panel:  Recent Labs Lab 03/15/17 1244 03/16/17 0516 03/17/17 0542 03/18/17 0539  NA 140 140 143 141  K 4.6 5.1 4.0 4.5  CL  --  106 110 107  CO2 26 27 26 27   GLUCOSE 105 93 92 102*  BUN 41.1* 39* 33* 26*  CREATININE 2.0* 1.99* 1.72* 1.62*  CALCIUM 9.6 8.9 8.6* 8.7*  MG  --   --  1.6* 1.9  PHOS  --   --  2.8 2.7   GFR: Estimated Creatinine Clearance: 27.3 mL/min (A) (by C-G formula based on SCr of 1.62 mg/dL (H)). Liver Function Tests:  Recent Labs Lab 03/15/17 1244 03/17/17 0542 03/18/17 0539  AST 46* 35 35  ALT 40 34 31  ALKPHOS 71 53 59  BILITOT 0.71 0.4 0.9  PROT 7.2 5.9* 6.2*  ALBUMIN 2.7* 2.4* 2.6*   No results for input(s): LIPASE, AMYLASE in the last 168 hours. No results for input(s): AMMONIA in the last 168 hours. Coagulation Profile: No results for input(s): INR, PROTIME in the last 168 hours. Cardiac Enzymes: No results for input(s): CKTOTAL, CKMB, CKMBINDEX, TROPONINI in the last 168 hours. BNP (last 3 results) No results for input(s): PROBNP in the last 8760 hours. HbA1C: No results for input(s): HGBA1C in the last 72 hours. CBG:  Recent Labs Lab 03/16/17 0754 03/17/17 0739 03/18/17 0736  GLUCAP 87 91 96   Lipid Profile: No results for input(s): CHOL, HDL, LDLCALC, TRIG, CHOLHDL, LDLDIRECT in the last 72 hours. Thyroid Function Tests:  Recent Labs  03/17/17 0542  TSH 0.619  FREET4 0.95   Anemia Panel: No results for input(s): VITAMINB12, FOLATE, FERRITIN, TIBC, IRON, RETICCTPCT in the last 72 hours. Sepsis Labs: No results for input(s): PROCALCITON,  LATICACIDVEN in the last 168 hours.  Recent Results (from the past 240 hour(s))  Culture, Urine     Status: None   Collection Time: 03/15/17  4:56 PM  Result Value Ref Range Status   Specimen Description URINE, CLEAN CATCH  Final   Special Requests NONE  Final   Culture   Final    NO GROWTH Performed at [facility], [address], [place], [place] [address]    Report Status 03/17/2017 FINAL  Final  Culture, blood (routine x 2)     Status: None (Preliminary result)   Collection Time: 03/15/17  5:19 PM  Result Value Ref Range Status   Specimen Description RIGHT ANTECUBITAL  Final   Special Requests IN PEDIATRIC BOTTLE Blood Culture adequate volume  Final   Culture   Final    NO GROWTH 3 DAYS Performed at [facility], [place] [address] [place], [place] [address]    Report Status PENDING  Incomplete  Culture, blood (routine x 2)     Status: None (Preliminary result)   Collection Time: 03/15/17  5:23 PM  Result Value Ref Range Status   Specimen Description LEFT ANTECUBITAL  Final   Special Requests IN PEDIATRIC BOTTLE Blood Culture adequate volume  Final   Culture   Final    NO GROWTH 3 DAYS Performed at [facility], [place] [address], [place], [place] [address]    Report Status PENDING  Incomplete     Radiology Studies: No results found. Scheduled Meds: . amLODipine  5 mg Oral Daily  . atorvastatin  80 mg Oral q1800  . cholecalciferol  1,000 Units Oral Daily  . feeding supplement (ENSURE ENLIVE)  237 mL Oral BID BM  . fluticasone furoate-vilanterol  1 puff Inhalation Daily  . levothyroxine  125 mcg Oral QAC breakfast  . magic mouthwash w/lidocaine  5 mL Oral QID  . metoprolol succinate  50 mg Oral BID  . multivitamin with minerals  1 tablet Oral Daily  . nystatin  5 mL Oral QID  . pantoprazole  40 mg Oral Daily  . sodium chloride flush  3 mL Intravenous Q12H  . Tbo-filgastrim (GRANIX) SQ  300 mcg Subcutaneous q1800   Continuous Infusions: . sodium  chloride 50 mL/hr at 03/18/17 0255  . sodium chloride Stopped (03/18/17 1300)  . ceFEPime (MAXIPIME) IV Stopped (03/18/17 1746)    LOS: 3 days   Kerney Elbe, DO [facility] Pager [phone]  If 7PM-7AM, please contact night-coverage [email or web address] Password [facility] 03/18/2017, 7:47 PM

## 2017-03-19 DIAGNOSIS — R042 Hemoptysis: Secondary | ICD-10-CM

## 2017-03-19 DIAGNOSIS — R040 Epistaxis: Secondary | ICD-10-CM

## 2017-03-19 LAB — COMPREHENSIVE METABOLIC PANEL
ALBUMIN: 2.6 g/dL — AB (ref 3.5–5.0)
ALK PHOS: 64 U/L (ref 38–126)
ALT: 31 U/L (ref 14–54)
AST: 38 U/L (ref 15–41)
Anion gap: 8 (ref 5–15)
BILIRUBIN TOTAL: 0.7 mg/dL (ref 0.3–1.2)
BUN: 23 mg/dL — AB (ref 6–20)
CO2: 29 mmol/L (ref 22–32)
Calcium: 8.7 mg/dL — ABNORMAL LOW (ref 8.9–10.3)
Chloride: 106 mmol/L (ref 101–111)
Creatinine, Ser: 1.46 mg/dL — ABNORMAL HIGH (ref 0.44–1.00)
GFR calc Af Amer: 39 mL/min — ABNORMAL LOW (ref >60)
GFR calc non Af Amer: 33 mL/min — ABNORMAL LOW (ref >60)
GLUCOSE: 98 mg/dL (ref 65–99)
POTASSIUM: 3.5 mmol/L (ref 3.5–5.1)
SODIUM: 143 mmol/L (ref 135–145)
Total Protein: 6.1 g/dL — ABNORMAL LOW (ref 6.5–8.1)

## 2017-03-19 LAB — EXPECTORATED SPUTUM ASSESSMENT W GRAM STAIN, RFLX TO RESP C

## 2017-03-19 LAB — RESPIRATORY PANEL BY PCR
Adenovirus: NOT DETECTED
BORDETELLA PERTUSSIS-RVPCR: NOT DETECTED
CORONAVIRUS 229E-RVPPCR: NOT DETECTED
Chlamydophila pneumoniae: NOT DETECTED
Coronavirus HKU1: NOT DETECTED
Coronavirus NL63: NOT DETECTED
Coronavirus OC43: NOT DETECTED
INFLUENZA B-RVPPCR: NOT DETECTED
Influenza A: NOT DETECTED
METAPNEUMOVIRUS-RVPPCR: NOT DETECTED
Mycoplasma pneumoniae: NOT DETECTED
PARAINFLUENZA VIRUS 2-RVPPCR: NOT DETECTED
PARAINFLUENZA VIRUS 4-RVPPCR: NOT DETECTED
Parainfluenza Virus 1: NOT DETECTED
Parainfluenza Virus 3: NOT DETECTED
RESPIRATORY SYNCYTIAL VIRUS-RVPPCR: NOT DETECTED
Rhinovirus / Enterovirus: NOT DETECTED

## 2017-03-19 LAB — CBC WITH DIFFERENTIAL/PLATELET
BASOS ABS: 0 10*3/uL (ref 0.0–0.1)
Basophils Relative: 0 %
EOS ABS: 0 10*3/uL (ref 0.0–0.7)
Eosinophils Relative: 0 %
HEMATOCRIT: 25.7 % — AB (ref 36.0–46.0)
HEMOGLOBIN: 8.9 g/dL — AB (ref 12.0–15.0)
LYMPHS PCT: 46 %
Lymphs Abs: 0.3 10*3/uL — ABNORMAL LOW (ref 0.7–4.0)
MCH: 28.8 pg (ref 26.0–34.0)
MCHC: 34.6 g/dL (ref 30.0–36.0)
MCV: 83.2 fL (ref 78.0–100.0)
MONOS PCT: 16 %
Monocytes Absolute: 0.1 10*3/uL (ref 0.1–1.0)
Neutro Abs: 0.2 10*3/uL — ABNORMAL LOW (ref 1.7–7.7)
Neutrophils Relative %: 38 %
Platelets: 59 10*3/uL — ABNORMAL LOW (ref 150–400)
RBC: 3.09 MIL/uL — AB (ref 3.87–5.11)
RDW: 14.4 % (ref 11.5–15.5)
WBC: 0.6 10*3/uL — AB (ref 4.0–10.5)

## 2017-03-19 LAB — EXPECTORATED SPUTUM ASSESSMENT W REFEX TO RESP CULTURE

## 2017-03-19 LAB — MAGNESIUM: Magnesium: 1.6 mg/dL — ABNORMAL LOW (ref 1.7–2.4)

## 2017-03-19 LAB — GLUCOSE, CAPILLARY: Glucose-Capillary: 97 mg/dL (ref 65–99)

## 2017-03-19 LAB — PHOSPHORUS: Phosphorus: 2.5 mg/dL (ref 2.5–4.6)

## 2017-03-19 MED ORDER — TBO-FILGRASTIM 480 MCG/0.8ML ~~LOC~~ SOSY
480.0000 ug | PREFILLED_SYRINGE | Freq: Every day | SUBCUTANEOUS | Status: DC
  Administered 2017-03-19 – 2017-03-20 (×2): 480 ug via SUBCUTANEOUS
  Filled 2017-03-19 (×2): qty 0.8

## 2017-03-19 MED ORDER — SALINE SPRAY 0.65 % NA SOLN
1.0000 | NASAL | Status: DC | PRN
  Administered 2017-03-19: 1 via NASAL
  Filled 2017-03-19: qty 44

## 2017-03-19 MED ORDER — MAGNESIUM SULFATE 2 GM/50ML IV SOLN
2.0000 g | Freq: Once | INTRAVENOUS | Status: AC
  Administered 2017-03-19: 2 g via INTRAVENOUS
  Filled 2017-03-19: qty 50

## 2017-03-19 MED ORDER — IPRATROPIUM-ALBUTEROL 0.5-2.5 (3) MG/3ML IN SOLN
3.0000 mL | Freq: Two times a day (BID) | RESPIRATORY_TRACT | Status: DC
  Administered 2017-03-19 – 2017-03-20 (×3): 3 mL via RESPIRATORY_TRACT
  Filled 2017-03-19 (×3): qty 3

## 2017-03-19 NOTE — Progress Notes (Signed)
PROGRESS NOTE    Renee Vance  ATF:[id number] DOB: [birth year] DOA: 03/15/2017 PCP: [name], PA-C   Brief Narrative:  Renee Vance is a 76 y.o. female with medical history significant for lung cancer, undergoing chemotherapy under Dr. Worthy Flank care, has received chemotherapy with Gemcitabine and Carboplatin 03/08/2017, hypertension, atrial fibrillation, just recently her Coumadin was stopped because of epistaxis, hypothyroidism. She presented to [facility] and was found to be severely neutropenic, had thrombocytopenia with platelet count of 17. She also had reports of hemoptysis at home and fever of 100.42F. Patient reports that ever since she started new chemotherapy about a few weeks ago she hasn't been feeling well. She developed sores in the mouth and had poor by mouth intake. No reports of chest pain or shortness of breath or palpitations. No abdominal pain, nausea or vomiting. She was a direct admission for the Oncology Clinic and is being treated for Neutropenic Fever. She is slowly improving however this AM was found to have a low WBC at 0.6 and ANC was 200 this AM. Patient also endorsed some more epistaxis so will start Saline Nasal spray and consider Amicar if worsens. Hb/Hct has remained stable and patient's Platelet Counts improved after transfusion. Continue to Monitor.   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)   Antineoplastic chemotherapy induced anemia   Chronic respiratory failure with hypoxia (HCC)   Acute renal failure superimposed on stage 4 chronic kidney disease (HCC)   Thrombocytopenia (HCC)  Febrile Neutropenia - Patient  presented with severe neutropenia in the setting of recent chemotherapy infusion -Had fever of 100.4 at Oncology Office  -Has been afebrile for the last 24 hours -WBC went from 0.4 -> 0.6 -> 0.9 -> 1.0 -> 0.6 ; ANC this AM was 200 -C/w Empiric Cefepime for now -Urinalysis showed Rare Bacteria, Negative Leukocytes, Negative Nitrites, 0-5 WBC and Large Blood -Blood Cx x2 showed NGTD at 4 days -Urine Cx Showed No Growth -CXR on Admission showed Slight increased interstitial and alveolar opacity in the left mid lung since the prior radiograph. Otherwise no significant interval changes. -Will Check Respiratory Virus Panel and place on Droplet Precautions -Will repeat CXR tomorrow AM and will need to obtain Sputum Cx -C/w NS but at 50 mL/hr instead of 75 mL/hr -Dr. Charlies Silvers spoke with Dr. Julien Nordmann who requested the transfer to hospital;  -Dr. Julien Nordmann recommended Granix 300 mcg daily until white blood cell count recovers and ANC is >1000 and will continue at this point. Since [name] dropped with increase Granix to 480 mcg daily to see if it will help boost her [place].   Mucositis / Hemoptysis / Epistaxis - Likely sequela of chemotherapy - C/w Nystatin swish and swallow 500,000 Units po 4 times daily and Magic mouthwash with Lidocaine 5 mL 4 times a day - Had an episode of hemoptysis at home but this was the only episode and she didn't have any issues since then but now states she is having epistaxis intermittently and had one episode of hemoptysis yesterday - Platelet Count was dropping so transfused 1 pool of platelets yesterday. Platelet Count improved and went from 23 -> 59.  -Added Sodium Chloride 0.65% Nasal Spray  1 spray as needed for congestion -Will consider Amicar if not improving; Consider Pulmonary and ENT Consult if declining  -Continue to Monitor   HTN (hypertension), Essential - Continue Norvasc 5 mg daily and Metoprolol 50 mg 2 times daily  Coronary Atherosclerosis of Native Coronary  Artery - Aspirin on hold due to Thrombocytopenia; Will restart when platelet count is better  - C/w Metoprolol XL 50 mg po BID Atorvastatin 80 mg po Daily  Hypomagnesemia -Patient's Mag Level this AM was 1.6 -Replete with Mag Sulfate 2 grams IV x 1 -Continue to Monitor and Replete Mag Level as Necessary -Repeat Mag Level in AM  Hypothyroidism -Checked TSH and was 0.619 and Free T4 was 0.95 -Continue Levothyroxine 125 mcg po Daily  COPD (chronic obstructive pulmonary disease) (HCC) / Chronic respiratory failure with hypoxia (HCC) - Stable respiratory status; C/w Supplemental O2 1 Liter via [place] - Continue bronchodilators with DuoNeb 3 mL RT BID and q6hprn for Wheezing and SOB as needed  PAF (paroxysmal atrial fibrillation) (HCC) / Chronic anticoagulation -CHADS vasc score at least 3 -Taken off of Anticoagulation with Coumadin just this past Monday (3 days ago) - Currently not on anticoagulation secondary to risk of bleeding and thrombocytopenia -Rate controlled with Metoprolol -C/w Telemetry   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 [name] -Has received chemotherapy with Gemcitabine and Carboplatin on 03/08/2017, did not receive Neulasta as this chemotherapy does not require Neulasta -Follow up with Dr. Julien Nordmann after D/C for re-evaluation before resuming systemic chemotherapy which may need dose adjustment   Antineoplastic Chemotherapy Induced Anemia -Patient's Hb/Hct went from 8.6/25.7 -> 7.6/22.2 -> 6.9/19.8 -> 8.9/26.2 -> 8.9/25.7 -S/p Transfusion of 2 units of pRBC's -Repeat CBC in AM and continue to monitor for S/Sx of Bleeding   Acute renal failure superimposed on stage 4 chronic kidney disease (HCC), improving  -Baseline creatinine 1.9 -Creatinine on this admission 2.0 and now BUN/Cr went from  39/1.99 -> 33/1.72 -> 26/1.62 ->  23/1.46 -C/w IV fluids with NS but at 50 mL/hr  -Repeat CMP in AM  Thrombocytopenia ([place]) -Secondary to sequela of chemotherapy -Transfused 2 unit of platelets;  -Platelet Count went from 17 -> 103 -> 35 -> 23 -> 59 -Monitor for bleeding as patient has Epistaxis intermittently  -Follow-up CBC in AM  DVT prophylaxis: SCDs given Thrombocytopenia  Code Status: FULL CODE Family Communication: No family present at bedside Disposition Plan: Remain Inpatient and Home in 1-2 days. Will get PT Evaluation   Consultants:   Dr. Julien Nordmann was contacted by Dr. Charlies Silvers   Procedures: None  Antimicrobials: Anti-infectives    Start     Dose/Rate Route Frequency Ordered Stop   03/15/17 1730  ceFEPIme (MAXIPIME) 2 g in dextrose 5 % 50 mL IVPB     2 g 100 mL/hr over 30 Minutes Intravenous Every 24 hours 03/15/17 1706       Subjective: Seen and examined and stated she was doing ok but stated that her nares were extremely dry. Stated she had some epistaxis and some hemoptysis yesterday. Denied CP but did have some SOB that was alleviated by Breathing Treatments. No nausea or vomiting. Denied any other complaints or concerns.   Objective: Vitals:   03/18/17 2103 03/18/17 2128 03/18/17 2333 03/19/17 0626  BP: (!) 165/59  (!) 150/106 (!) 145/61  Pulse: 73  73 64  Resp: 16  18  Temp: 98.1 F (36.7 C)   99.2 F (37.3 C)  TempSrc: Oral   Oral  SpO2:  95%  95%  Weight:      Height:        Intake/Output Summary (Last 24 hours) at 03/19/17 1405 Last data filed at 03/19/17 1006  Gross per 24 hour  Intake             1985 ml  Output              200 ml  Net             1785 ml   Filed Weights   03/16/17 0442 03/17/17 0500 03/18/17 0458  Weight: 64.8 kg (142 lb 13.7 oz) 64.5 kg (142 lb 3.2 oz) 66.6 kg (146 lb 13.2 oz)   Examination: Physical Exam:  Constitutional: Pleasant 76 yo Caucasian female in NAD sitting in chair bedside. No other concerns or complaints at this time.  Eyes: Sclerae  Anicteric; Conjunctivae non-injected ENMT: Had some dried blood in Nares Right worse than Left. Grossly normal hearing Neck: Supple with No JVD.  Respiratory: Diminished to auscultation but no appreciable wheezing or rhonchi. Patient was not tachypnec but was wearing O2 via [place]. Cardiovascular: RRR, S1, S2. No m/r/g Abdomen: Soft, NT, ND. Bowel Sounds present  GU: Deferred Musculoskeletal: No contractures. No cyanosis Skin: Warm and Dry. No rashes or lesions appreciated Neurologic: CN 2-12 grossly intact. No focal deficits appreciated Psychiatric: Pleasant mood and affect with intact judgement and insight.  Data Reviewed: I have personally reviewed following labs and imaging studies  CBC:  Recent Labs Lab 03/15/17 1244 03/16/17 0516 03/17/17 0542 03/18/17 0539 03/19/17 0533  WBC 0.4* 0.6* 0.9* 1.0* 0.6*  NEUTROABS 0.2*  --  0.5* 0.6* 0.2*  HGB 8.6* 7.6* 6.9* 8.9* 8.9*  HCT 25.7* 22.2* 19.8* 26.2* 25.7*  MCV 88.9 85.7 87.2 83.7 83.2  PLT 17* 103* 35* 23* 59*   Basic Metabolic Panel:  Recent Labs Lab 03/15/17 1244 03/16/17 0516 03/17/17 0542 03/18/17 0539 03/19/17 0533  NA 140 140 143 141 143  K 4.6 5.1 4.0 4.5 3.5  CL  --  106 110 107 106  CO2 26 27 26 27 29   GLUCOSE 105 93 92 102* 98  BUN 41.1* 39* 33* 26* 23*  CREATININE 2.0* 1.99* 1.72* 1.62* 1.46*  CALCIUM 9.6 8.9 8.6* 8.7* 8.7*  MG  --   --  1.6* 1.9 1.6*  PHOS  --   --  2.8 2.7 2.5   GFR: Estimated Creatinine Clearance: 30.3 mL/min (A) (by C-G formula based on SCr of 1.46 mg/dL (H)). Liver Function Tests:  Recent Labs Lab 03/15/17 1244 03/17/17 0542 03/18/17 0539 03/19/17 0533  AST 46* 35 35 38  ALT 40 34 31 31  ALKPHOS 71 53 59 64  BILITOT 0.71 0.4 0.9 0.7  PROT 7.2 5.9* 6.2* 6.1*  ALBUMIN 2.7* 2.4* 2.6* 2.6*   No results for input(s): LIPASE, AMYLASE in the last 168 hours. No results for input(s): AMMONIA in the last 168 hours. Coagulation Profile: No results for input(s): INR, PROTIME in  the last 168 hours. Cardiac Enzymes: No results for input(s): CKTOTAL, CKMB, CKMBINDEX, TROPONINI in the last 168 hours. BNP (last 3 results) No results for input(s): PROBNP in the last 8760 hours. HbA1C: No results for input(s): HGBA1C in the last 72 hours. CBG:  Recent Labs Lab 03/16/17 0754 03/17/17 0739 03/18/17 0736 03/19/17 0808  GLUCAP 87 91 96 97  Lipid Profile: No results for input(s): CHOL, HDL, LDLCALC, TRIG, CHOLHDL, LDLDIRECT in the last 72 hours. Thyroid Function Tests:  Recent Labs  03/17/17 0542  TSH 0.619  FREET4 0.95   Anemia Panel: No results for input(s): VITAMINB12, FOLATE, FERRITIN, TIBC, IRON, RETICCTPCT in the last 72 hours. Sepsis Labs: No results for input(s): PROCALCITON, LATICACIDVEN in the last 168 hours.  Recent Results (from the past 240 hour(s))  Culture, Urine     Status: None   Collection Time: 03/15/17  4:56 PM  Result Value Ref Range Status   Specimen Description URINE, CLEAN CATCH  Final   Special Requests NONE  Final   Culture   Final    NO GROWTH Performed at [facility], [address] [place], [place] [address]    Report Status 03/17/2017 FINAL  Final  Culture, blood (routine x 2)     Status: None (Preliminary result)   Collection Time: 03/15/17  5:19 PM  Result Value Ref Range Status   Specimen Description RIGHT ANTECUBITAL  Final   Special Requests IN PEDIATRIC BOTTLE Blood Culture adequate volume  Final   Culture   Final    NO GROWTH 3 DAYS Performed at [facility], [place] [address] [place], [place] [address]    Report Status PENDING  Incomplete  Culture, blood (routine x 2)     Status: None (Preliminary result)   Collection Time: 03/15/17  5:23 PM  Result Value Ref Range Status   Specimen Description LEFT ANTECUBITAL  Final   Special Requests IN PEDIATRIC BOTTLE Blood Culture adequate volume  Final   Culture   Final    NO GROWTH 3 DAYS Performed at [facility], [place] [address]  [place], [place] [address]    Report Status PENDING  Incomplete     Radiology Studies: No results found. Scheduled Meds: . amLODipine  5 mg Oral Daily  . atorvastatin  80 mg Oral q1800  . cholecalciferol  1,000 Units Oral Daily  . feeding supplement (ENSURE ENLIVE)  237 mL Oral BID BM  . fluticasone furoate-vilanterol  1 puff Inhalation Daily  . ipratropium-albuterol  3 mL Nebulization BID  . levothyroxine  125 mcg Oral QAC breakfast  . magic mouthwash w/lidocaine  5 mL Oral QID  . metoprolol succinate  50 mg Oral BID  . multivitamin with minerals  1 tablet Oral Daily  . nystatin  5 mL Oral QID  . pantoprazole  40 mg Oral Daily  . sodium chloride flush  3 mL Intravenous Q12H  . Tbo-filgastrim (GRANIX) SQ  480 mcg Subcutaneous q1800   Continuous Infusions: . sodium chloride 50 mL/hr at 03/19/17 0045  . ceFEPime (MAXIPIME) IV Stopped (03/18/17 1746)    LOS: 4 days   Kerney Elbe, DO [facility] Pager [phone]  If 7PM-7AM, please contact night-coverage [email or web address] Password [email or web address] 03/19/2017, 2:05 PM

## 2017-03-20 ENCOUNTER — Inpatient Hospital Stay (HOSPITAL_COMMUNITY): Payer: PPO

## 2017-03-20 DIAGNOSIS — R05 Cough: Secondary | ICD-10-CM

## 2017-03-20 DIAGNOSIS — J189 Pneumonia, unspecified organism: Secondary | ICD-10-CM

## 2017-03-20 LAB — PHOSPHORUS: Phosphorus: 2.5 mg/dL (ref 2.5–4.6)

## 2017-03-20 LAB — COMPREHENSIVE METABOLIC PANEL
ALT: 28 U/L (ref 14–54)
ANION GAP: 8 (ref 5–15)
AST: 36 U/L (ref 15–41)
Albumin: 2.5 g/dL — ABNORMAL LOW (ref 3.5–5.0)
Alkaline Phosphatase: 64 U/L (ref 38–126)
BILIRUBIN TOTAL: 0.5 mg/dL (ref 0.3–1.2)
BUN: 23 mg/dL — AB (ref 6–20)
CO2: 28 mmol/L (ref 22–32)
Calcium: 8.7 mg/dL — ABNORMAL LOW (ref 8.9–10.3)
Chloride: 106 mmol/L (ref 101–111)
Creatinine, Ser: 1.42 mg/dL — ABNORMAL HIGH (ref 0.44–1.00)
GFR calc Af Amer: 40 mL/min — ABNORMAL LOW (ref >60)
GFR, EST NON AFRICAN AMERICAN: 35 mL/min — AB (ref >60)
Glucose, Bld: 95 mg/dL (ref 65–99)
POTASSIUM: 3.4 mmol/L — AB (ref 3.5–5.1)
Sodium: 142 mmol/L (ref 135–145)
Total Protein: 6.1 g/dL — ABNORMAL LOW (ref 6.5–8.1)

## 2017-03-20 LAB — CBC WITH DIFFERENTIAL/PLATELET
Basophils Absolute: 0 10*3/uL (ref 0.0–0.1)
Basophils Relative: 1 %
EOS PCT: 1 %
Eosinophils Absolute: 0 10*3/uL (ref 0.0–0.7)
HEMATOCRIT: 25.5 % — AB (ref 36.0–46.0)
Hemoglobin: 8.7 g/dL — ABNORMAL LOW (ref 12.0–15.0)
LYMPHS ABS: 0.3 10*3/uL — AB (ref 0.7–4.0)
LYMPHS PCT: 33 %
MCH: 28.6 pg (ref 26.0–34.0)
MCHC: 34.1 g/dL (ref 30.0–36.0)
MCV: 83.9 fL (ref 78.0–100.0)
MONO ABS: 0.2 10*3/uL (ref 0.1–1.0)
MONOS PCT: 21 %
Neutro Abs: 0.4 10*3/uL — ABNORMAL LOW (ref 1.7–7.7)
Neutrophils Relative %: 44 %
Platelets: 54 10*3/uL — ABNORMAL LOW (ref 150–400)
RBC: 3.04 MIL/uL — ABNORMAL LOW (ref 3.87–5.11)
RDW: 14 % (ref 11.5–15.5)
WBC: 1 10*3/uL — CL (ref 4.0–10.5)

## 2017-03-20 LAB — CULTURE, BLOOD (ROUTINE X 2)
CULTURE: NO GROWTH
CULTURE: NO GROWTH
SPECIAL REQUESTS: ADEQUATE
Special Requests: ADEQUATE

## 2017-03-20 LAB — BPAM PLATELET PHERESIS
BLOOD PRODUCT EXPIRATION DATE: 201806092359
ISSUE DATE / TIME: 201806091152
Unit Type and Rh: 6200

## 2017-03-20 LAB — STREP PNEUMONIAE URINARY ANTIGEN: STREP PNEUMO URINARY ANTIGEN: NEGATIVE

## 2017-03-20 LAB — MAGNESIUM: MAGNESIUM: 1.9 mg/dL (ref 1.7–2.4)

## 2017-03-20 LAB — PREPARE PLATELET PHERESIS: UNIT DIVISION: 0

## 2017-03-20 LAB — GLUCOSE, CAPILLARY: Glucose-Capillary: 93 mg/dL (ref 65–99)

## 2017-03-20 MED ORDER — VANCOMYCIN HCL IN DEXTROSE 1-5 GM/200ML-% IV SOLN
1000.0000 mg | Freq: Once | INTRAVENOUS | Status: AC
  Administered 2017-03-20: 1000 mg via INTRAVENOUS
  Filled 2017-03-20: qty 200

## 2017-03-20 MED ORDER — SODIUM CHLORIDE 3 % IN NEBU
4.0000 mL | INHALATION_SOLUTION | Freq: Every day | RESPIRATORY_TRACT | Status: DC
  Administered 2017-03-21: 4 mL via RESPIRATORY_TRACT
  Filled 2017-03-20 (×2): qty 4

## 2017-03-20 MED ORDER — SALINE SPRAY 0.65 % NA SOLN
2.0000 | NASAL | Status: DC | PRN
  Filled 2017-03-20: qty 44

## 2017-03-20 MED ORDER — GUAIFENESIN ER 600 MG PO TB12
1200.0000 mg | ORAL_TABLET | Freq: Two times a day (BID) | ORAL | Status: DC
  Administered 2017-03-20 – 2017-03-21 (×3): 1200 mg via ORAL
  Filled 2017-03-20 (×3): qty 2

## 2017-03-20 MED ORDER — VANCOMYCIN HCL IN DEXTROSE 750-5 MG/150ML-% IV SOLN
750.0000 mg | INTRAVENOUS | Status: DC
  Filled 2017-03-20: qty 150

## 2017-03-20 MED ORDER — POTASSIUM CHLORIDE CRYS ER 20 MEQ PO TBCR
40.0000 meq | EXTENDED_RELEASE_TABLET | Freq: Two times a day (BID) | ORAL | Status: AC
  Administered 2017-03-20 (×2): 40 meq via ORAL
  Filled 2017-03-20 (×2): qty 2

## 2017-03-20 MED ORDER — BENZONATATE 100 MG PO CAPS
200.0000 mg | ORAL_CAPSULE | Freq: Three times a day (TID) | ORAL | Status: DC | PRN
  Administered 2017-03-20: 200 mg via ORAL
  Filled 2017-03-20: qty 2

## 2017-03-20 NOTE — Progress Notes (Signed)
Pharmacy Antibiotic Note  Renee Vance is a 76 y.o. female with metastatic NSCLC who was sent from the Cancer center to [facility] hospital on 03/15/17 for management of neutropenia and pancytopenia. Pharmacy to dose cefepime for suspected sepsis.  On 6/11 Pt has not shown improvement, Vancomycin added  - scr 1.42 (crcl ~ 31 mls/min) - WBC 1.0   Plan: - continue cefepime 2gm IV q24h - vancomycin 1000 mg IV x1, then vancomycin 750 mg IV q24h - f/u renal function  Height: 5\' 4"  (162.6 cm) Weight: 145 lb 1 oz (65.8 kg) IBW/kg (Calculated) : 54.7_______________________________  Temp (24hrs), Avg:98.6 F (37 C), Min:98.2 F (36.8 C), Max:99 F (37.2 C)   Recent Labs Lab 03/16/17 0516 03/17/17 0542 03/18/17 0539 03/19/17 0533 03/20/17 0524  WBC 0.6* 0.9* 1.0* 0.6* 1.0*  CREATININE 1.99* 1.72* 1.62* 1.46* 1.42*    Estimated Creatinine Clearance: 31 mL/min (A) (by C-G formula based on SCr of 1.42 mg/dL (H)).    Allergies  Allergen Reactions  . Ampicillin Nausea And Vomiting    Has patient had a PCN reaction causing immediate rash, facial/tongue/throat swelling, SOB or lightheadedness with hypotension: no Has patient had a PCN reaction causing severe rash involving mucus membranes or skin necrosis: no Has patient had a PCN reaction that required hospitalization: no Has patient had a PCN reaction occurring within the last 10 years: no If all of the above answers are "NO", then may proceed with Cephalosporin use  . Codeine Nausea And Vomiting   Antimicrobials this admission:  6/6 cefepime >>  6/11 vancomycin >>   Dose adjustments this admission:  ---  Microbiology results:  6/6 BCx: NGTD 6/6 UCx: NGF  6/10 Sputum: few Gr+ cocci in pairs, rare Gr+ rods  6/10 Resp panel: negative    Thank you for allowing pharmacy to be a part of this patient's care.   Royetta Asal, PharmD, BCPS Pager [phone] 03/20/2017 12:26 PM

## 2017-03-20 NOTE — Progress Notes (Signed)
PROGRESS NOTE    Renee Vance  GUY:[id number] DOB: 07/08/1941 DOA: 03/15/2017 PCP: [name], PA-C   Brief Narrative:  Renee Vance is a 76 y.o. female with medical history significant for lung cancer, undergoing chemotherapy under Dr. Worthy Flank care, has received chemotherapy with Gemcitabine and Carboplatin 03/08/2017, hypertension, atrial fibrillation, just recently her Coumadin was stopped because of epistaxis, hypothyroidism. She presented to [facility] and was found to be severely neutropenic, had thrombocytopenia with platelet count of 17. She also had reports of hemoptysis at home and fever of 100.48F. Patient reports that ever since she started new chemotherapy about a few weeks ago she hasn't been feeling well. She developed sores in the mouth and had poor by mouth intake. No reports of chest pain or shortness of breath or palpitations. No abdominal pain, nausea or vomiting. She was a direct admission for the Oncology Clinic and is being treated for Neutropenic Fever. She is slowly improving however this AM was found to have a low WBC at 0.6 and ANC was 200 this AM. Patient also endorsed some more epistaxis so will start Saline Nasal spray and consider Amicar if worsens. Hb/Hct has remained stable and patient's Platelet Counts improved after transfusion. Patient now endorsing brown colored sputum. Will broaden Abx Coverage and Add IV Vancomycin with Pharmacy to dose. Discussed Case with Dr. Julien Nordmann in Oncology and he may come see patient tomorrow.   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)   Antineoplastic chemotherapy induced anemia   Chronic  respiratory failure with hypoxia (HCC)   Acute renal failure superimposed on stage 4 chronic kidney disease (HCC)   Thrombocytopenia (HCC)  Febrile Neutropenia likely from suspected Left sided PNA, poA - Patient presented with severe neutropenia in the setting of recent chemotherapy infusion -Had fever of 100.4 at Oncology Office  -Has been afebrile for the last 24 hours -WBC went from 0.4 -> 0.6 -> 0.9 -> 1.0 -> 0.6 -> 1.0 ; ANC this AM was 400 -C/w Empiric Cefepime for now; Added Patient on IV Vancomycin today as she was not progressing significantly -Urinalysis showed Rare Bacteria, Negative Leukocytes, Negative Nitrites, 0-5 WBC and Large Blood -Blood Cx x2 showed NGTD at 4 days -Urine Cx Showed No Growth -CXR on Admission showed Slight increased interstitial and alveolar opacity in the left mid lung since the prior radiograph. Otherwise no significant interval changes. ?-Postobstructive PNA -Checked Respiratory Virus Panel and was NEGATIVE and Removed Pateint's Droplet Precautions -CXR this AM (03/20/17) showed Left upper lobe focal airspace disease likely reflecting posttreatment changes and Persistent bilateral interstitial thickening increased compared with 03/15/2017. -Check Strep Urine Ag and Legionella Urine Ag -Sputum Gram Stain showed Few WBC, Predominantly PMN, Few Gram + Cocci in Pairs, Rare Gram - Rods and Rare Squamous Epithelial Cells; Cx Re-incubated for Better Growth -D/C'd IVF -Dr. Julien Nordmann recommended Granix 300 mcg daily until white blood cell count recovers and ANC is >1000 and will continue at this point. Since [place] dropped with increase Granix to 480 mcg daily to see if it will help boost her ANC.  -Dr. Julien Nordmann to possibly see the patient tomorrow   Mucositis / Hemoptysis / Epistaxis - Likely sequela  of chemotherapy - C/w Nystatin swish and swallow 500,000 Units po 4 times daily and Magic mouthwash with Lidocaine 5 mL 4 times a day - Had an episode of hemoptysis at  home but this was the only episode and she didn't have any issues since then but now states she is having epistaxis intermittently and had one episode of hemoptysis yesterday  -Added Sodium Chloride 0.65% Nasal Spray 2 spray as needed for congestion -Will consider Amicar if not improving; Consider Pulmonary and ENT Consult if declining but stable for now -Continue to Monitor   HTN (hypertension), Essential - Continue Norvasc 5 mg daily and Metoprolol 50 mg 2 times daily  Coronary Atherosclerosis of Native Coronary Artery - Aspirin on hold due to Thrombocytopenia; Will restart when platelet count is better  - C/w Metoprolol XL 50 mg po BID Atorvastatin 80 mg po Daily  Hypomagnesemia -Patient's Mag Level this AM was 1.9 -Continue to Monitor and Replete Mag Level as Necessary -Repeat Mag Level in AM  Hypothyroidism -Checked TSH and was 0.619 and Free T4 was 0.95 -Continue Levothyroxine 125 mcg po Daily  COPD (chronic obstructive pulmonary disease) (HCC) / Chronic respiratory failure with hypoxia (HCC) - Stable respiratory status; C/w Supplemental O2 1 Liter via [place] - Continue bronchodilators with DuoNeb 3 mL RT BID and q6hprn for Wheezing and SOB as needed - C/w Guaifenesin 1200 mg po BID; Discontinued Delsym  - Added Hypertonic Saline Nebs and Flutter Valve and Incentive Spirometry.  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 [name] -Has received chemotherapy with Gemcitabine and Carboplatin on 03/08/2017, did not receive Neulasta  as this chemotherapy does not require Neulasta -Follow up with Dr. Julien Nordmann after D/C for re-evaluation before resuming systemic chemotherapy which may need dose adjustment; Dr. Julien Nordmann to see the patient possibly tomorrow in the hospital   Antineoplastic Chemotherapy Induced Anemia -Patient's Hb/Hct went from 8.6/25.7 -> 7.6/22.2 -> 6.9/19.8 -> 8.9/26.2 -> 8.9/25.7 -> 8.7/25.2 -S/p Transfusion of 2 units of pRBC's -Repeat CBC in AM and continue to monitor for S/Sx of Bleeding   Acute renal failure superimposed on stage 4 chronic kidney disease ([place]), improving  -Baseline creatinine 1.9 -Creatinine on this admission 2.0 and now BUN/Cr went from  39/1.99 -> 33/1.72 -> 26/1.62 -> 23/1.46 -> 23/1.42 -D/C'd IV fluids with NS -Repeat CMP in AM  Thrombocytopenia ([place]) -Secondary to sequela of chemotherapy -Transfused 2 unit of platelets;  -Platelet Count went from 17 -> 103 -> 35 -> 23 -> 59 -> 54 -Monitor for bleeding as patient has Epistaxis intermittently  -Follow-up CBC in AM  DVT prophylaxis: SCDs given Thrombocytopenia  Code Status: FULL CODE Family Communication: No family present at bedside Disposition Plan: Remain Inpatient and Home in 1-2 days. Will get PT Evaluation   Consultants:   Dr. Julien Nordmann was contacted by Dr. Charlies Silvers; I spoke with Dr. Julien Nordmann today and he will likely see the patient tomorrow.    Procedures: None  Antimicrobials: Anti-infectives    Start     Dose/Rate Route Frequency Ordered Stop   03/21/17 1200  vancomycin (VANCOCIN) IVPB 750 mg/150 ml  premix     750 mg 150 mL/hr over 60 Minutes Intravenous Every 24 hours 03/20/17 1228     03/20/17 1100  vancomycin (VANCOCIN) IVPB 1000 mg/200 mL premix     1,000 mg 200 mL/hr over 60 Minutes Intravenous  Once 03/20/17 1017 03/20/17 1354   03/15/17 1730  ceFEPIme (MAXIPIME) 2 g in dextrose 5 % 50 mL IVPB     2 g 100 mL/hr over 30 Minutes Intravenous Every 24 hours 03/15/17 1706       Subjective: Seen and  examined and stated she was feeling ok. Felt as as if the Saline Nose Spray helped. Started Coughing up brown/rust colored Sputum.    Objective: Vitals:   03/19/17 2015 03/19/17 2050 03/20/17 0700 03/20/17 1521  BP:  (!) 154/65 (!) 164/62 (!) 164/61  Pulse: 71 75 70 71  Resp: 18 18 17 17   Temp:  99 F (37.2 C) 98.5 F (36.9 C) 98.2 F (36.8 C)  TempSrc:  Oral Oral Oral  SpO2: 97% 96% 97% 98%  Weight:   65.8 kg (145 lb 1 oz)   Height:        Intake/Output Summary (Last 24 hours) at 03/20/17 1532 Last data filed at 03/20/17 1400  Gross per 24 hour  Intake             1420 ml  Output             3430 ml  Net            -2010 ml   Filed Weights   03/17/17 0500 03/18/17 0458 03/20/17 0700  Weight: 64.5 kg (142 lb 3.2 oz) 66.6 kg (146 lb 13.2 oz) 65.8 kg (145 lb 1 oz)   Examination: Physical Exam:  Constitutional: Pleasant 76 yo Caucasian female in NAD awake and calm Eyes: Sclerae anicteric; Lids normal  ENMT: Grossly normal hearing. Patient had normal appearing external ears and nose Neck: Supple with no visible JVD Respiratory: Diminished to auscultation bilaterally but more so on the Left. Patient was not tachypenic or using any accessory muscles to breathe. Cardiovascular: RRR; S1 S2. No appreciable lower extremity edema Abdomen: Soft; NT, ND. Bowel sounds present GU: Deferred  Musculoskeletal: No cyanosis; No contractures Skin: Warm and Dry; No rashes or lesions on a limited skin evaluation Neurologic: CN 2-12 grossly intact. No focal deficits Psychiatric: Pleasant mood and Affect. Intact Judgement and insight.   Data Reviewed: I have personally reviewed following labs and imaging studies  CBC:  Recent Labs Lab 03/15/17 1244 03/16/17 0516 03/17/17 0542 03/18/17 0539 03/19/17 0533 03/20/17 0524  WBC 0.4* 0.6* 0.9* 1.0* 0.6* 1.0*  NEUTROABS 0.2*  --  0.5* 0.6* 0.2* 0.4*  HGB 8.6* 7.6* 6.9* 8.9* 8.9* 8.7*  HCT 25.7* 22.2* 19.8* 26.2* 25.7* 25.5*  MCV 88.9 85.7  87.2 83.7 83.2 83.9  PLT 17* 103* 35* 23* 59* 54*   Basic Metabolic Panel:  Recent Labs Lab 03/16/17 0516 03/17/17 0542 03/18/17 0539 03/19/17 0533 03/20/17 0524  NA 140 143 141 143 142  K 5.1 4.0 4.5 3.5 3.4*  CL 106 110 107 106 106  CO2 27 26 27 29 28   GLUCOSE 93 92 102* 98 95  BUN 39* 33* 26* 23* 23*  CREATININE 1.99* 1.72* 1.62* 1.46* 1.42*  CALCIUM 8.9 8.6* 8.7* 8.7* 8.7*  MG  --  1.6* 1.9 1.6* 1.9  PHOS  --  2.8 2.7 2.5 2.5   GFR: Estimated Creatinine Clearance: 31 mL/min (A) (by C-G formula  based on SCr of 1.42 mg/dL (H)). Liver Function Tests:  Recent Labs Lab 03/15/17 1244 03/17/17 0542 03/18/17 0539 03/19/17 0533 03/20/17 0524  AST 46* 35 35 38 36  ALT 40 34 31 31 28   ALKPHOS 71 53 59 64 64  BILITOT 0.71 0.4 0.9 0.7 0.5  PROT 7.2 5.9* 6.2* 6.1* 6.1*  ALBUMIN 2.7* 2.4* 2.6* 2.6* 2.5*   No results for input(s): LIPASE, AMYLASE in the last 168 hours. No results for input(s): AMMONIA in the last 168 hours. Coagulation Profile: No results for input(s): INR, PROTIME in the last 168 hours. Cardiac Enzymes: No results for input(s): CKTOTAL, CKMB, CKMBINDEX, TROPONINI in the last 168 hours. BNP (last 3 results) No results for input(s): PROBNP in the last 8760 hours. HbA1C: No results for input(s): HGBA1C in the last 72 hours. CBG:  Recent Labs Lab 03/16/17 0754 03/17/17 0739 03/18/17 0736 03/19/17 0808 03/20/17 0805  GLUCAP 87 91 96 97 93   Lipid Profile: No results for input(s): CHOL, HDL, LDLCALC, TRIG, CHOLHDL, LDLDIRECT in the last 72 hours. Thyroid Function Tests: No results for input(s): TSH, T4TOTAL, FREET4, T3FREE, THYROIDAB in the last 72 hours. Anemia Panel: No results for input(s): VITAMINB12, FOLATE, FERRITIN, TIBC, IRON, RETICCTPCT in the last 72 hours. Sepsis Labs: No results for input(s): PROCALCITON, LATICACIDVEN in the last 168 hours.  Recent Results (from the past 240 hour(s))  Culture, Urine     Status: None   Collection  Time: 03/15/17  4:56 PM  Result Value Ref Range Status   Specimen Description URINE, CLEAN CATCH  Final   Special Requests NONE  Final   Culture   Final    NO GROWTH Performed at [facility], [address] [place], [place] [address]    Report Status 03/17/2017 FINAL  Final  Culture, blood (routine x 2)     Status: None (Preliminary result)   Collection Time: 03/15/17  5:19 PM  Result Value Ref Range Status   Specimen Description RIGHT ANTECUBITAL  Final   Special Requests IN PEDIATRIC BOTTLE Blood Culture adequate volume  Final   Culture   Final    NO GROWTH 4 DAYS Performed at [facility], [place] [address] [place], [place] [address]    Report Status PENDING  Incomplete  Culture, blood (routine x 2)     Status: None (Preliminary result)   Collection Time: 03/15/17  5:23 PM  Result Value Ref Range Status   Specimen Description LEFT ANTECUBITAL  Final   Special Requests IN PEDIATRIC BOTTLE Blood Culture adequate volume  Final   Culture   Final    NO GROWTH 4 DAYS Performed at [facility], [place] [address] [place], [place] [address]    Report Status PENDING  Incomplete  Respiratory Panel by PCR     Status: None   Collection Time: 03/19/17 12:16 PM  Result Value Ref Range Status   Adenovirus NOT DETECTED NOT DETECTED Final   Coronavirus 229E NOT DETECTED NOT DETECTED Final   Coronavirus HKU1 NOT DETECTED NOT DETECTED Final   Coronavirus NL63 NOT DETECTED NOT DETECTED Final   Coronavirus OC43 NOT DETECTED NOT DETECTED Final   Metapneumovirus NOT DETECTED NOT DETECTED Final   Rhinovirus / Enterovirus NOT DETECTED NOT DETECTED Final   Influenza A NOT DETECTED NOT DETECTED Final   Influenza B NOT DETECTED NOT DETECTED Final   Parainfluenza Virus 1 NOT DETECTED NOT DETECTED Final   Parainfluenza Virus 2 NOT DETECTED NOT DETECTED  Final   Parainfluenza Virus 3 NOT DETECTED NOT DETECTED Final   Parainfluenza Virus 4 NOT DETECTED NOT DETECTED Final   Respiratory  Syncytial Virus NOT DETECTED NOT DETECTED Final   Bordetella pertussis NOT DETECTED NOT DETECTED Final   Chlamydophila pneumoniae NOT DETECTED NOT DETECTED Final   Mycoplasma pneumoniae NOT DETECTED NOT DETECTED Final    Comment: Performed at [facility], [place] [address] [place], [place] [address]  Culture, expectorated sputum-assessment     Status: None   Collection Time: 03/19/17  4:05 PM  Result Value Ref Range Status   Specimen Description SPUTUM  Final   Special Requests NONE  Final   Sputum evaluation THIS SPECIMEN IS ACCEPTABLE FOR SPUTUM CULTURE  Final   Report Status 03/19/2017 FINAL  Final  Culture, respiratory (NON-Expectorated)     Status: None (Preliminary result)   Collection Time: 03/19/17  4:05 PM  Result Value Ref Range Status   Specimen Description SPUTUM  Final   Special Requests NONE Reflexed from [id number]  Final   Gram Stain   Final    FEW WBC PRESENT, PREDOMINANTLY MONONUCLEAR RARE SQUAMOUS EPITHELIAL CELLS PRESENT FEW GRAM POSITIVE COCCI IN PAIRS RARE GRAM POSITIVE RODS    Culture   Final    CULTURE REINCUBATED FOR BETTER GROWTH Performed at [facility], [place] [address] [place], [place] [address]    Report Status PENDING  Incomplete     Radiology Studies: Dg Chest 2 View  Result Date: 03/20/2017 CLINICAL DATA:  Cough, smoker EXAM: CHEST  2 VIEW COMPARISON:  Chest x-ray 03/15/2017 CT chest 03/03/2017 FINDINGS: There is bilateral diffuse mild interstitial thickening. There is left perihilar airspace disease unchanged compared to the prior scans. There is a small left pleural effusion. There is no pneumothorax. There is mild stable cardiomegaly. The osseous structures are unremarkable. IMPRESSION: 1. Left upper lobe focal airspace disease likely reflecting posttreatment changes. 2. Persistent bilateral interstitial thickening increased compared with 03/15/2017. Electronically Signed   By: Kathreen Devoid   On: 03/20/2017 09:33   Scheduled Meds: .  amLODipine  5 mg Oral Daily  . atorvastatin  80 mg Oral q1800  . cholecalciferol  1,000 Units Oral Daily  . feeding supplement (ENSURE ENLIVE)  237 mL Oral BID BM  . fluticasone furoate-vilanterol  1 puff Inhalation Daily  . guaiFENesin  1,200 mg Oral BID  . ipratropium-albuterol  3 mL Nebulization BID  . levothyroxine  125 mcg Oral QAC breakfast  . magic mouthwash w/lidocaine  5 mL Oral QID  . metoprolol succinate  50 mg Oral BID  . multivitamin with minerals  1 tablet Oral Daily  . nystatin  5 mL Oral QID  . pantoprazole  40 mg Oral Daily  . potassium chloride  40 mEq Oral BID  . sodium chloride flush  3 mL Intravenous Q12H  . sodium chloride HYPERTONIC  4 mL Nebulization Daily  . Tbo-filgastrim (GRANIX) SQ  480 mcg Subcutaneous q1800   Continuous Infusions: . ceFEPime (MAXIPIME) IV Stopped (03/19/17 1830)  . [START ON 03/21/2017] vancomycin      LOS: 5 days   Kerney Elbe, DO [facility] Pager [phone]  If 7PM-7AM, please contact night-coverage [email or web address] Password [facility] 03/20/2017, 3:32 PM

## 2017-03-20 NOTE — Progress Notes (Signed)
Patient has increased SOB. Some crackles. PCP on call was notifiied

## 2017-03-20 NOTE — Care Management Important Message (Signed)
Important Message  Patient Details  Name: Renee Vance MRN: [id number] Date of Birth: Mar 06, 1941   Medicare Important Message Given:  Yes    Kerin Salen 03/20/2017, 10:27 AMImportant Message  Patient Details  Name: Renee Vance MRN: [id number] Date of Birth: 04/25/1941   Medicare Important Message Given:  Yes    Kerin Salen 03/20/2017, 10:27 AM

## 2017-03-21 ENCOUNTER — Ambulatory Visit: Payer: PPO | Admitting: Nurse Practitioner

## 2017-03-21 ENCOUNTER — Ambulatory Visit: Payer: PPO

## 2017-03-21 DIAGNOSIS — D61818 Other pancytopenia: Secondary | ICD-10-CM

## 2017-03-21 DIAGNOSIS — C7972 Secondary malignant neoplasm of left adrenal gland: Secondary | ICD-10-CM

## 2017-03-21 LAB — COMPREHENSIVE METABOLIC PANEL
ALT: 28 U/L (ref 14–54)
AST: 40 U/L (ref 15–41)
Albumin: 2.6 g/dL — ABNORMAL LOW (ref 3.5–5.0)
Alkaline Phosphatase: 70 U/L (ref 38–126)
Anion gap: 6 (ref 5–15)
BUN: 22 mg/dL — AB (ref 6–20)
CHLORIDE: 105 mmol/L (ref 101–111)
CO2: 29 mmol/L (ref 22–32)
CREATININE: 1.51 mg/dL — AB (ref 0.44–1.00)
Calcium: 8.6 mg/dL — ABNORMAL LOW (ref 8.9–10.3)
GFR calc non Af Amer: 32 mL/min — ABNORMAL LOW (ref >60)
GFR, EST AFRICAN AMERICAN: 37 mL/min — AB (ref >60)
Glucose, Bld: 89 mg/dL (ref 65–99)
Potassium: 3.9 mmol/L (ref 3.5–5.1)
SODIUM: 140 mmol/L (ref 135–145)
Total Bilirubin: 0.7 mg/dL (ref 0.3–1.2)
Total Protein: 6.2 g/dL — ABNORMAL LOW (ref 6.5–8.1)

## 2017-03-21 LAB — CULTURE, RESPIRATORY

## 2017-03-21 LAB — CBC WITH DIFFERENTIAL/PLATELET
BASOS PCT: 1 %
Basophils Absolute: 0 10*3/uL (ref 0.0–0.1)
EOS ABS: 0 10*3/uL (ref 0.0–0.7)
EOS PCT: 0 %
HCT: 26 % — ABNORMAL LOW (ref 36.0–46.0)
Hemoglobin: 8.7 g/dL — ABNORMAL LOW (ref 12.0–15.0)
LYMPHS PCT: 11 %
Lymphs Abs: 0.4 10*3/uL — ABNORMAL LOW (ref 0.7–4.0)
MCH: 27.8 pg (ref 26.0–34.0)
MCHC: 33.5 g/dL (ref 30.0–36.0)
MCV: 83.1 fL (ref 78.0–100.0)
MONO ABS: 0.9 10*3/uL (ref 0.1–1.0)
Monocytes Relative: 23 %
NEUTROS PCT: 65 %
Neutro Abs: 2.7 10*3/uL (ref 1.7–7.7)
PLATELETS: 60 10*3/uL — AB (ref 150–400)
RBC: 3.13 MIL/uL — ABNORMAL LOW (ref 3.87–5.11)
RDW: 13.9 % (ref 11.5–15.5)
WBC: 4 10*3/uL (ref 4.0–10.5)

## 2017-03-21 LAB — MAGNESIUM: Magnesium: 1.6 mg/dL — ABNORMAL LOW (ref 1.7–2.4)

## 2017-03-21 LAB — CULTURE, RESPIRATORY W GRAM STAIN: Culture: NORMAL

## 2017-03-21 LAB — LEGIONELLA PNEUMOPHILA SEROGP 1 UR AG: L. pneumophila Serogp 1 Ur Ag: NEGATIVE

## 2017-03-21 LAB — PHOSPHORUS: Phosphorus: 2.2 mg/dL — ABNORMAL LOW (ref 2.5–4.6)

## 2017-03-21 LAB — GLUCOSE, CAPILLARY: Glucose-Capillary: 86 mg/dL (ref 65–99)

## 2017-03-21 MED ORDER — BENZONATATE 200 MG PO CAPS: 200.0000 mg | ORAL_CAPSULE | Freq: Three times a day (TID) | ORAL | 0 refills | Status: DC | PRN

## 2017-03-21 MED ORDER — MAGNESIUM SULFATE 2 GM/50ML IV SOLN
2.0000 g | Freq: Once | INTRAVENOUS | Status: AC
  Administered 2017-03-21: 2 g via INTRAVENOUS
  Filled 2017-03-21: qty 50

## 2017-03-21 MED ORDER — GUAIFENESIN ER 600 MG PO TB12: 1200.0000 mg | ORAL_TABLET | Freq: Two times a day (BID) | ORAL | 0 refills | Status: DC

## 2017-03-21 MED ORDER — IPRATROPIUM-ALBUTEROL 0.5-2.5 (3) MG/3ML IN SOLN: 3.0000 mL | Freq: Four times a day (QID) | RESPIRATORY_TRACT | 0 refills | Status: DC | PRN

## 2017-03-21 MED ORDER — SALINE SPRAY 0.65 % NA SOLN: 2.0000 | NASAL | 0 refills | Status: DC | PRN

## 2017-03-21 MED ORDER — POTASSIUM PHOSPHATE MONOBASIC 500 MG PO TABS
500.0000 mg | ORAL_TABLET | Freq: Three times a day (TID) | ORAL | Status: DC
  Administered 2017-03-21: 500 mg via ORAL
  Filled 2017-03-21 (×2): qty 1

## 2017-03-21 MED ORDER — LEVOFLOXACIN 750 MG PO TABS: 750.0000 mg | ORAL_TABLET | ORAL | 0 refills | Status: DC

## 2017-03-21 MED ORDER — LEVOFLOXACIN 750 MG PO TABS
750.0000 mg | ORAL_TABLET | ORAL | Status: DC
  Administered 2017-03-21: 750 mg via ORAL
  Filled 2017-03-21: qty 1

## 2017-03-21 MED ORDER — IPRATROPIUM-ALBUTEROL 0.5-2.5 (3) MG/3ML IN SOLN
3.0000 mL | Freq: Three times a day (TID) | RESPIRATORY_TRACT | Status: DC
  Administered 2017-03-21: 3 mL via RESPIRATORY_TRACT
  Filled 2017-03-21 (×2): qty 3

## 2017-03-21 MED ORDER — ENSURE ENLIVE PO LIQD: 237.0000 mL | Freq: Two times a day (BID) | ORAL | 12 refills | Status: DC

## 2017-03-21 MED ORDER — MAGIC MOUTHWASH W/LIDOCAINE: 5.0000 mL | Freq: Four times a day (QID) | ORAL | 0 refills | Status: DC | PRN

## 2017-03-21 NOTE — Progress Notes (Signed)
Subjective: The patient is seen and examined today. She is feeling much better but continues to have shortness breath with exertion. She was admitted to [facility] with neutropenic fever and she was started on treatment with Granix but her total white blood count has been very low over the last few days. She denied having any recent fever or chills. She denied having any nausea, vomiting, diarrhea or constipation. She continues to have anemia and thrombocytopenia.  Objective: Vital signs in last 24 hours: Temp:  [98.2 F (36.8 C)-99.5 F (37.5 C)] 98.4 F (36.9 C) (06/12 0626) Pulse Rate:  [65-72] 65 (06/12 0626) Resp:  [17-18] 17 (06/12 0626) BP: (136-164)/(54-61) 139/57 (06/12 0626) SpO2:  [94 %-98 %] 94 % (06/12 0823)  Intake/Output from previous day: 06/11 0701 - 06/12 0700 In: 1290 [P.O.:840; I.V.:250; IV Piggyback:200] Out: 2570 [Urine:2570] Intake/Output this shift: No intake/output data recorded.  General appearance: alert, cooperative, fatigued and no distress Resp: clear to auscultation bilaterally Cardio: regular rate and rhythm, S1, S2 normal, no murmur, click, rub or gallop GI: soft, non-tender; bowel sounds normal; no masses,  no organomegaly Extremities: extremities normal, atraumatic, no cyanosis or edema  Lab Results:   Recent Labs  03/20/17 0524 03/21/17 0601  WBC 1.0* 4.0  HGB 8.7* 8.7*  HCT 25.5* 26.0*  PLT 54* 60*   BMET  Recent Labs  03/20/17 0524 03/21/17 0601  NA 142 140  K 3.4* 3.9  CL 106 105  CO2 28 29  GLUCOSE 95 89  BUN 23* 22*  CREATININE 1.42* 1.51*  CALCIUM 8.7* 8.6*    Studies/Results: Dg Chest 2 View  Result Date: 03/20/2017 CLINICAL DATA:  Cough, smoker EXAM: CHEST  2 VIEW COMPARISON:  Chest x-ray 03/15/2017 CT chest 03/03/2017 FINDINGS: There is bilateral diffuse mild interstitial thickening. There is left perihilar airspace disease unchanged compared to the prior scans. There is a small left pleural effusion.  There is no pneumothorax. There is mild stable cardiomegaly. The osseous structures are unremarkable. IMPRESSION: 1. Left upper lobe focal airspace disease likely reflecting posttreatment changes. 2. Persistent bilateral interstitial thickening increased compared with 03/15/2017. Electronically Signed   By: Kathreen Devoid   On: 03/20/2017 09:33    Medications: I have reviewed the patient's current medications.  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 The patient was started on systemic chemotherapy with carboplatin and gemcitabine status post 1 cycle. Unfortunately her treatment was complicated with severe pancytopenia and neutropenic fever. She was started on treatment with cefepime and vancomycin in addition to Granix. The total several days for her total white blood count to recover and the patient is feeling much better today. I recommended for her to discontinue treatment with Granix at this point. She probably can also discontinue her current treatment with cefepime and vancomycin and continue on oral Levaquin for 5 more days. I will arrange for the patient a follow-up appointment with me at the [facility] early this in 2 weeks for reevaluation before resuming her treatment. I will consider adjusting her chemotherapy dose for the future treatment. Thank you so much for taking good care of Ms. Sinn, I will continue to follow up the patient with you and assist in her management an as-needed basis.  LOS: 6 days    Clyde Zarrella K. 03/21/2017

## 2017-03-21 NOTE — Progress Notes (Signed)
03/21/17  1430  Reviewed discharge instructions with patient. Patient verbalized understanding of discharge instructions. Copy of discharge instructions and prescription given to patient.

## 2017-03-21 NOTE — Discharge Summary (Signed)
Physician Discharge Summary  Renee Vance NTI:[id number] DOB: 12/30/1940 DOA: 03/15/2017  PCP: [name], PA-C  Admit date: 03/15/2017 Discharge date: 03/21/2017  Admitted From: Home Disposition: Home  Recommendations for Outpatient Follow-up:  1. Follow up with PCP in 1-2 weeks 2. Follow up with Pulmonary Dr. Elsworth Soho as an outpatient 3. Follow up with Oncology Dr. Julien Nordmann as an outpatient within 1-2 weeks to discuss chemotherapy dose adjustment for future treatment 4. Follow up with ENT Dr. Erik Obey for Epistaxis and Blood clots in nose right after leaving the Hospital at 2:30 this afternoon (03/21/17) 5. Please obtain CMP/CBC, Mag, Phos in one week 6. Repeat CXR as an outpatient in 3-6 weeks   Home Health: No Equipment/Devices: None; C/w Home O2   Discharge Condition: Stable CODE STATUS: FULL CODE  Diet recommendation: Regular Diet  Brief/Interim Summary: Renee Vance a 76 y.o.femalewith medical history significant for lung cancer, undergoing chemotherapy under Dr. Worthy Flank care, has received chemotherapy with Gemcitabine and Carboplatin 03/08/2017, hypertension, atrial fibrillation, just recently her Coumadin was stopped because of epistaxis, hypothyroidism. She presented to [facility] and was found to be severely neutropenic, had thrombocytopenia with platelet count of 17. She also had reports of hemoptysis at home and fever of 100.63F. Patient reports that ever since she started new chemotherapy about a few weeks ago she hasn't been feeling well. She developed sores in the mouth and had poor by mouth intake. No reports of chest pain or shortness of breath or palpitations. No abdominal pain, nausea or vomiting. She was a direct admission for the Oncology Clinic and is being treated for Neutropenic Fever. She is slowly improving however this AM was found to have a low WBC at 0.6 and ANC was 200 this AM. Patient also endorsed some more epistaxis so will start  Saline Nasal spray and consider Amicar if worsens. Hb/Hct has remained stable and patient's Platelet Counts improved after transfusion. Patient now endorsing brown colored sputum. Broadened Abx Coverage and Add IV Vancomycin with Pharmacy to dose. Discussed Case with Dr. Julien Nordmann in Oncology and he saw patient this AM. Her labs improved and WBC was 4.0 and ANC was 2700 and she felt tremendously better. Dr. Julien Nordmann recommended switching Abx to po Levofloxacin for 5 days. Patient had a little epistaxis so an appointment was made for ENT follow up directly leaving the hospital at Dr. Noreene Filbert office. Patient was deemed medically stable and Neutropenic fever improved and she will need to follow up with PCP, Oncology, Pulmonology and ENT at D/C.   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)   Antineoplastic chemotherapy induced anemia   Chronic respiratory failure with hypoxia (HCC)   Acute renal failure superimposed on stage 4 chronic kidney disease (HCC)   Thrombocytopenia (HCC)  Febrile Neutropenia likely from suspected Left sided PNA, poA - Patient presented with severe neutropenia in the setting of recent chemotherapy infusion -Had fever of 100.4 at Oncology Office  -Has been afebrile for the last 24 hours -WBC went from 0.4 -> 0.6 -> 0.9 -> 1.0 -> 0.6 -> 1.0 -> 4.0 ; ANC this AM was 2,700 -Changed Abx from IV Cefepime and IV Vancomycin to po Levaquin x 5 days total  and renallay dosed -Urinalysis showed Rare Bacteria, Negative Leukocytes, Negative Nitrites, 0-5 WBC and Large Blood -Blood Cx x2 showed NGTD at 5 days -Urine Cx Showed No Growth -CXR on Admission showed Slight increased  interstitial and alveolar opacity in the left mid lung since the prior radiograph. Otherwise no significant interval changes. ?-Postobstructive PNA -Checked Respiratory Virus Panel and was NEGATIVE and Removed Pateint's Droplet Precautions -CXR this AM (03/20/17) showed Left upper lobe focal airspace disease likely reflecting posttreatment changes and Persistent bilateral interstitial thickening increased compared with 03/15/2017. -Checked Strep Urine Ag and Legionella Urine Ag and both Negative -Sputum Gram Stain showed Few WBC, Predominantly PMN, Few Gram + Cocci in Pairs, Rare Gram - Rods and Rare Squamous Epithelial Cells; Cx was consistent with Normal Resp Flora -D/C'd IVF  -Dr. Julien Nordmann evaluated -Will need follow up with Oncology a well as PCP at D/C; C/w Empiric Levofloxacin x5 days per Dr. Worthy Flank Recc's -Follow up with Pulmonary Dr. Elsworth Soho and repeat CXR in 3-6 weeks   Mucositis / Hemoptysis / Epistaxis - Likely sequela of chemotherapy - Was given Nystatin swish and swallow 500,000 Units po 4 times daily and Magic mouthwash withLidocaine 5 mL 4 times a day whiile hospitalized; C/w Magic Mouth Was at D/C - Had an episode of hemoptysis at home but this was the only episode and she didn't have any issues since then but now states she is having epistaxis intermittently  -Added Sodium Chloride 0.65% Nasal Spray 2 spray as needed for congestion -Will consider Amicar if not improving;  -Made an appointment with Dr. Erik Obey on 5/36/64 at 4:03 pm directly following Hospital D/C to evaluate Intermittent Epistaxis and blood Clots  HTN (hypertension), Essential - Continue Norvasc 5 mg daily and Metoprolol 50 mg 2 times daily  Coronary Atherosclerosis of Native Coronary Artery - Aspirin on hold due to Thrombocytopenia; Can restart as an outpatient at the [place] of PCP - C/w Metoprolol XL 50 mg po BID Atorvastatin 80 mg po Daily  Hypomagnesemia -Patient's Mag Level this AM was  1.6 -Replete Prior to D/C -Continue to Monitor and Replete Mag Level as Necessary -Repeat Mag Level in AM  Hypothyroidism -Checked TSH and was 0.619 and Free T4 was 0.95 -Continue Levothyroxine 125 mcg po Daily  COPD (chronic obstructive pulmonary disease) (HCC) /Chronic respiratory failure with hypoxia (HCC) - Stable respiratory status; C/w Supplemental O2 1 Liter via [place] - Continue bronchodilators with DuoNeb 3 mL RT BID and q6hprn for Wheezing and SOB as needed - C/w Guaifenesin 1200 mg po BID; Discontinued Delsym  - Added Hypertonic Saline Nebs and Flutter Valve and Incentive Spirometry. - Follow up with Pulmonology Dr. 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 [name] -Has received chemotherapy with Gemcitabine and Carboplatin on 03/08/2017, did not receive Neulasta as this chemotherapy does not require Neulasta -Follow up with Dr. Julien Nordmann after D/C for re-evaluation before resuming systemic chemotherapy which may need dose adjustment; -Dr. Julien Nordmann evaluated. Follow up with him as an outpatient  Antineoplastic Chemotherapy Induced Anemia -Patient's Hb/Hct went from 8.6/25.7 -> 7.6/22.2 -> 6.9/19.8 -> 8.9/26.2 -> 8.9/25.7 ->  8.7/25.2 -> 8.7/26.0 -S/p Transfusion of 2 units of pRBC's -Repeat CBC as an outpatient and continue to monitor for S/Sx of Bleeding   Acute renal failure superimposed on stage 4 chronic kidney disease ([place]), improving  -Baseline creatinine 1.9 -Creatinine on this admission 2.0 and now BUN/Cr  went from  39/1.99 -> 33/1.72 -> 26/1.62 -> 23/1.46 -> 23/1.42 -> 22/1.51 -D/C'd IV fluids with NS -Repeat CMP as an outpatient   Thrombocytopenia ([place]) -Secondary to sequela of chemotherapy -Transfused 2 unit of platelets;  -Platelet Count went from 17 -> 103 -> 35 -> 23 -> 59 -> 54 -> 60 -Monitor for bleeding as patient has Epistaxis intermittently; Made Appointment for ENT today right after patient is Discharged from the hospital  -Follow-up CBC within 1 week  Discharge Instructions  Discharge Instructions    Call MD for:  difficulty breathing, headache or visual disturbances    Complete by:  As directed    Call MD for:  extreme fatigue    Complete by:  As directed    Call MD for:  hives    Complete by:  As directed    Call MD for:  persistant dizziness or light-headedness    Complete by:  As directed    Call MD for:  persistant nausea and vomiting    Complete by:  As directed    Call MD for:  severe uncontrolled pain    Complete by:  As directed    Call MD for:  temperature >100.4    Complete by:  As directed    Diet - low sodium heart healthy    Complete by:  As directed    Discharge instructions    Complete by:  As directed    Follow up with PCP, Pulmonology, and Oncology as an outpatient. Take all medications as prescribed. If symptoms change or worsen please return to the ED for evaluation.   Increase activity slowly    Complete by:  As directed      Allergies as of 03/21/2017      Reactions   Ampicillin Nausea And Vomiting   Has patient had a PCN reaction causing immediate rash, facial/tongue/throat swelling, SOB or lightheadedness with hypotension: no Has patient had a PCN reaction causing severe rash involving mucus membranes or skin necrosis: no Has patient had a PCN reaction that required hospitalization: no Has patient had a PCN reaction occurring within the last 10 years: no If all of the above answers are "NO", then may proceed with Cephalosporin use    Codeine Nausea And Vomiting      Medication List    STOP taking these medications   dextromethorphan 30 MG/5ML liquid Commonly known as:  DELSYM     TAKE these medications   amLODipine 5 MG tablet Commonly known as:  NORVASC Take 5 mg by mouth daily.   aspirin 81 MG tablet Take 81 mg by mouth daily.   atorvastatin 80 MG tablet Commonly known as:  LIPITOR Take 80 mg by mouth daily.   benzonatate 200 MG capsule Commonly known as:  TESSALON Take 1 capsule (200 mg total) by mouth 3 (three) times daily as needed for cough.   feeding supplement (ENSURE ENLIVE) Liqd Take 237 mLs by mouth 2 (two) times daily between meals.   fluticasone furoate-vilanterol 100-25 MCG/INH Aepb Commonly known as:  BREO ELLIPTA Inhale 1 puff into the lungs daily.   furosemide 20 MG tablet Commonly known as:  LASIX Take 20 mg by mouth daily.  guaiFENesin 600 MG 12 hr tablet Commonly known as:  MUCINEX Take 2 tablets (1,200 mg total) by mouth 2 (two) times daily.   ipratropium-albuterol 0.5-2.5 (3) MG/3ML Soln Commonly known as:  DUONEB Take 3 mLs by nebulization every 6 (six) hours as needed.   levofloxacin 750 MG tablet Commonly known as:  LEVAQUIN Take 1 tablet (750 mg total) by mouth every other day.   levothyroxine 125 MCG tablet Commonly known as:  SYNTHROID, LEVOTHROID Take 125 mcg by mouth daily before breakfast.   magic mouthwash w/lidocaine Soln Take 5 mLs by mouth 4 (four) times daily as needed for mouth pain.   metoprolol succinate 50 MG 24 hr tablet Commonly known as:  TOPROL-XL Take 1 tablet (50 mg total) by mouth 2 (two) times daily.   multivitamin with minerals tablet Take 1 tablet by mouth daily.   omeprazole 20 MG capsule Commonly known as:  PRILOSEC Take 1 capsule (20 mg total) by mouth daily.   OXYGEN Inhale 2 L into the lungs continuous.   sodium chloride 0.65 % Soln nasal spray Commonly known as:  OCEAN Place 2 sprays into both nostrils as needed for  congestion.   Vitamin D (Cholecalciferol) 1000 units Caps Take 1,000 Units by mouth daily.      Follow-up Information    [name], PA-C. Call in 1 week(s).   Specialty:  Physician Assistant Why:  Call to schedule an appointment within 1 week  Contact information: [address] [place] [address] [phone]        [name], MD. Call in 1 week(s).   Specialty:  Oncology Why:  Call to schedule a follow up appointment within 1-2 weeks Contact information: [place] [address] [phone]        [name], MD. Call.   Specialty:  Pulmonary Disease Why:  Call to follow up within 1 week.  Contact information: [address] [place] [address] [phone]        Jodi Marble, MD. Go to.   Specialty:  Otolaryngology Why:  Appointment made for you today (03/21/17) for 2:30 pm for Epistaxis and blood Clot Follow up.  Contact information: [address] [place] [address] [phone]          Allergies  Allergen Reactions  . Ampicillin Nausea And Vomiting    Has patient had a PCN reaction causing immediate rash, facial/tongue/throat swelling, SOB or lightheadedness with hypotension: no Has patient had a PCN reaction causing severe rash involving mucus membranes or skin necrosis: no Has patient had a PCN reaction that required hospitalization: no Has patient had a PCN reaction occurring within the last 10 years: no If all of the above answers are "NO", then may proceed with Cephalosporin use  . Codeine Nausea And Vomiting   Consultations:  Oncology Dr. Julien Nordmann  Procedures/Studies: Dg Chest 2 View  Result Date: 03/20/2017 CLINICAL DATA:  Cough, smoker EXAM: CHEST  2 VIEW COMPARISON:  Chest x-ray 03/15/2017 CT chest 03/03/2017 FINDINGS: There is bilateral diffuse mild interstitial thickening. There is left perihilar airspace disease unchanged compared to the prior scans. There is a small  left pleural effusion. There is no pneumothorax. There is mild stable cardiomegaly. The osseous structures are unremarkable. IMPRESSION: 1. Left upper lobe focal airspace disease likely reflecting posttreatment changes. 2. Persistent bilateral interstitial thickening increased compared with 03/15/2017. Electronically Signed   By: Kathreen Devoid   On: 03/20/2017 09:33   Dg  Chest 2 View  Result Date: 03/03/2017 CLINICAL DATA:  Hemoptysis and worsening shortness of breath EXAM: CHEST  2 VIEW COMPARISON:  CT chest 12/26/2016, radiograph 11/28/2016, 11/01/2016 FINDINGS: Small left pleural effusion or thickening. Diffuse fibrosis. Parenchymal pattern on the right appears unchanged radiographically. Left upper and lower lobe and perihilar interstitial and alveolar opacity probably stable. Left perihilar region bronchiectasis. Slight worsened opacification at the left base. Stable cardiomediastinal silhouette with atherosclerosis. IMPRESSION: 1. Peripheral interstitial opacities in the right lung appear grossly unchanged compared to the most recent prior radiograph. 2. Bronchiectasis, interstitial and alveolar process in the left perihilar region also probably stable compared with prior chest CT from March 2018. 3. Slight increased density at the left lung base, cannot exclude superimposed pneumonia 4. Left pleural disease grossly stable Electronically Signed   By: Donavan Foil M.D.   On: 03/03/2017 14:18   Ct Chest Wo Contrast  Result Date: 03/03/2017 CLINICAL DATA:  History of lung carcinoma with current hemoptysis EXAM: CT CHEST WITHOUT CONTRAST TECHNIQUE: Multidetector CT imaging of the chest was performed following the standard protocol without IV contrast. COMPARISON:  01/18/2017, 12/26/2016 FINDINGS: Cardiovascular: Somewhat limited due to the lack of IV contrast. Aortic calcifications are seen without aneurysmal dilatation. Coronary calcifications are noted. No significant cardiac enlargement is seen.  Mediastinum/Nodes: The thoracic inlet is within normal limits. Scattered small lymph nodes are noted within the mediastinum. These are stable from the prior examination and were not shown to be hypermetabolic on recent PET-CT. The esophagus as visualized is within normal limits. Lungs/Pleura: Chronic fibrotic changes are again identified bilaterally. Diffuse bronchiectatic changes are seen throughout the left lung with peripheral fibrotic changes stable from the recent PET-CT. Left-sided pleural effusion is again identified and stable. Upper Abdomen: Gall bladder has been surgically removed. The left adrenal nodule seen previously is again identified. Musculoskeletal: Degenerative changes of the thoracic spine are seen. IMPRESSION: Stable left adrenal nodule. Stable chronic fibrotic changes in both lungs. Stable mediastinal lymph nodes are noted as well. Left pleural effusion. No definitive acute abnormality is seen. Electronically Signed   By: Inez Catalina M.D.   On: 03/03/2017 17:02   Dg Chest Port 1 View  Result Date: 03/15/2017 CLINICAL DATA:  Neutropenia EXAM: PORTABLE CHEST 1 VIEW COMPARISON:  03/03/2017, FINDINGS: Right lung is grossly clear. Slight interval increase in left mid lung interstitial and alveolar opacity compared to prior radiograph. No large effusion. Mild shift of mediastinal contents to the left. Stable enlarged cardiomediastinal silhouette with atherosclerosis. No pneumothorax. IMPRESSION: Slight increased interstitial and alveolar opacity in the left mid lung since the prior radiograph. Otherwise no significant interval changes. Electronically Signed   By: Donavan Foil M.D.   On: 03/15/2017 22:03    Subjective: Seen and examined and felt a lot better. Wanting to go home. Denied any SOB. States she had some blood and epistaxis but not too much. No other complaints.   Discharge Exam: Vitals:   03/21/17 0923 03/21/17 1530  BP: 135/61 (!) 143/60  Pulse: 74 80  Resp: 20 18  Temp:   98.6 F (37 C)   Vitals:   03/21/17 0820 03/21/17 0823 03/21/17 0923 03/21/17 1530  BP:   135/61 (!) 143/60  Pulse:   74 80  Resp:   20 18  Temp:    98.6 F (37 C)  TempSrc:    Oral  SpO2: 94% 94% 98% 96%  Weight:      Height:       General: Pt  is alert, awake, not in acute distress Cardiovascular: RRR, S1/S2 +, no rubs, no gallops Respiratory: Slightly diminished bilaterally, no wheezing, no rhonchi; Wearing supplemental O2 Abdominal: Soft, NT, ND, bowel sounds + Extremities: no edema, no cyanosis  The results of significant diagnostics from this hospitalization (including imaging, microbiology, ancillary and laboratory) are listed below for reference.    Microbiology: Recent Results (from the past 240 hour(s))  Culture, Urine     Status: None   Collection Time: 03/15/17  4:56 PM  Result Value Ref Range Status   Specimen Description URINE, CLEAN CATCH  Final   Special Requests NONE  Final   Culture   Final    NO GROWTH Performed at [facility], [address] [place], [place] [address]    Report Status 03/17/2017 FINAL  Final  Culture, blood (routine x 2)     Status: None   Collection Time: 03/15/17  5:19 PM  Result Value Ref Range Status   Specimen Description RIGHT ANTECUBITAL  Final   Special Requests IN PEDIATRIC BOTTLE Blood Culture adequate volume  Final   Culture   Final    NO GROWTH 5 DAYS Performed at [facility], [place] [address], [place], [place] [address]    Report Status 03/20/2017 FINAL  Final  Culture, blood (routine x 2)     Status: None   Collection Time: 03/15/17  5:23 PM  Result Value Ref Range Status   Specimen Description LEFT ANTECUBITAL  Final   Special Requests IN PEDIATRIC BOTTLE Blood Culture adequate volume  Final   Culture   Final    NO GROWTH 5 DAYS Performed at [facility], [place] [address], [place], [place] [address]    Report Status 03/20/2017 FINAL  Final  Respiratory Panel by PCR     Status: None   Collection  Time: 03/19/17 12:16 PM  Result Value Ref Range Status   Adenovirus NOT DETECTED NOT DETECTED Final   Coronavirus 229E NOT DETECTED NOT DETECTED Final   Coronavirus HKU1 NOT DETECTED NOT DETECTED Final   Coronavirus NL63 NOT DETECTED NOT DETECTED Final   Coronavirus OC43 NOT DETECTED NOT DETECTED Final   Metapneumovirus NOT DETECTED NOT DETECTED Final   Rhinovirus / Enterovirus NOT DETECTED NOT DETECTED Final   Influenza A NOT DETECTED NOT DETECTED Final   Influenza B NOT DETECTED NOT DETECTED Final   Parainfluenza Virus 1 NOT DETECTED NOT DETECTED Final   Parainfluenza Virus 2 NOT DETECTED NOT DETECTED Final   Parainfluenza Virus 3 NOT DETECTED NOT DETECTED Final   Parainfluenza Virus 4 NOT DETECTED NOT DETECTED Final   Respiratory Syncytial Virus NOT DETECTED NOT DETECTED Final   Bordetella pertussis NOT DETECTED NOT DETECTED Final   Chlamydophila pneumoniae NOT DETECTED NOT DETECTED Final   Mycoplasma pneumoniae NOT DETECTED NOT DETECTED Final    Comment: Performed at [facility], [place] [address], [place], [place] [address]  Culture, expectorated sputum-assessment     Status: None   Collection Time: 03/19/17  4:05 PM  Result Value Ref Range Status   Specimen Description SPUTUM  Final   Special Requests NONE  Final   Sputum evaluation THIS SPECIMEN IS ACCEPTABLE FOR SPUTUM CULTURE  Final   Report Status 03/19/2017 FINAL  Final  Culture, respiratory (NON-Expectorated)     Status: None   Collection Time: 03/19/17  4:05 PM  Result Value Ref Range Status   Specimen Description SPUTUM  Final   Special Requests NONE Reflexed  from [id number]  Final   Gram Stain   Final    FEW WBC PRESENT, PREDOMINANTLY MONONUCLEAR RARE SQUAMOUS EPITHELIAL CELLS PRESENT FEW GRAM POSITIVE COCCI IN PAIRS RARE GRAM POSITIVE RODS    Culture   Final    Consistent with normal respiratory flora. Performed at [facility], [place] [address], [place], [place] [address]    Report Status 03/21/2017  FINAL  Final    Labs: BNP (last 3 results)  Recent Labs  10/30/16 0529  BNP 268.3*   Basic Metabolic Panel:  Recent Labs Lab 03/17/17 0542 03/18/17 0539 03/19/17 0533 03/20/17 0524 03/21/17 0601  NA 143 141 143 142 140  K 4.0 4.5 3.5 3.4* 3.9  CL 110 107 106 106 105  CO2 26 27 29 28 29   GLUCOSE 92 102* 98 95 89  BUN 33* 26* 23* 23* 22*  CREATININE 1.72* 1.62* 1.46* 1.42* 1.51*  CALCIUM 8.6* 8.7* 8.7* 8.7* 8.6*  MG 1.6* 1.9 1.6* 1.9 1.6*  PHOS 2.8 2.7 2.5 2.5 2.2*   Liver Function Tests:  Recent Labs Lab 03/17/17 0542 03/18/17 0539 03/19/17 0533 03/20/17 0524 03/21/17 0601  AST 35 35 38 36 40  ALT 34 31 31 28 28   ALKPHOS 53 59 64 64 70  BILITOT 0.4 0.9 0.7 0.5 0.7  PROT 5.9* 6.2* 6.1* 6.1* 6.2*  ALBUMIN 2.4* 2.6* 2.6* 2.5* 2.6*   No results for input(s): LIPASE, AMYLASE in the last 168 hours. No results for input(s): AMMONIA in the last 168 hours. CBC:  Recent Labs Lab 03/17/17 0542 03/18/17 0539 03/19/17 0533 03/20/17 0524 03/21/17 0601  WBC 0.9* 1.0* 0.6* 1.0* 4.0  NEUTROABS 0.5* 0.6* 0.2* 0.4* 2.7  HGB 6.9* 8.9* 8.9* 8.7* 8.7*  HCT 19.8* 26.2* 25.7* 25.5* 26.0*  MCV 87.2 83.7 83.2 83.9 83.1  PLT 35* 23* 59* 54* 60*   Cardiac Enzymes: No results for input(s): CKTOTAL, CKMB, CKMBINDEX, TROPONINI in the last 168 hours. BNP: Invalid input(s): POCBNP CBG:  Recent Labs Lab 03/17/17 0739 03/18/17 0736 03/19/17 0808 03/20/17 0805 03/21/17 0830  GLUCAP 91 96 97 93 86   D-Dimer No results for input(s): DDIMER in the last 72 hours. Hgb A1c No results for input(s): HGBA1C in the last 72 hours. Lipid Profile No results for input(s): CHOL, HDL, LDLCALC, TRIG, CHOLHDL, LDLDIRECT in the last 72 hours. Thyroid function studies No results for input(s): TSH, T4TOTAL, T3FREE, THYROIDAB in the last 72 hours.  Invalid input(s): FREET3 Anemia work up No results for input(s): VITAMINB12, FOLATE, FERRITIN, TIBC, IRON, RETICCTPCT in the last 72  hours. Urinalysis    Component Value Date/Time   COLORURINE YELLOW 03/15/2017 1656   APPEARANCEUR CLEAR 03/15/2017 1656   LABSPEC 1.006 03/15/2017 1656   PHURINE 5.0 03/15/2017 1656   GLUCOSEU NEGATIVE 03/15/2017 1656   HGBUR LARGE (A) 03/15/2017 1656   BILIRUBINUR NEGATIVE 03/15/2017 1656   KETONESUR NEGATIVE 03/15/2017 1656   PROTEINUR NEGATIVE 03/15/2017 1656   NITRITE NEGATIVE 03/15/2017 1656   LEUKOCYTESUR NEGATIVE 03/15/2017 1656   Sepsis Labs Invalid input(s): PROCALCITONIN,  WBC,  LACTICIDVEN Microbiology Recent Results (from the past 240 hour(s))  Culture, Urine     Status: None   Collection Time: 03/15/17  4:56 PM  Result Value Ref Range Status   Specimen Description URINE, CLEAN CATCH  Final   Special Requests NONE  Final   Culture   Final    NO GROWTH Performed at [facility], [place] [address], [place], [place] [address]  Report Status 03/17/2017 FINAL  Final  Culture, blood (routine x 2)     Status: None   Collection Time: 03/15/17  5:19 PM  Result Value Ref Range Status   Specimen Description RIGHT ANTECUBITAL  Final   Special Requests IN PEDIATRIC BOTTLE Blood Culture adequate volume  Final   Culture   Final    NO GROWTH 5 DAYS Performed at [facility], [place] [address] [place], [place] [address]    Report Status 03/20/2017 FINAL  Final  Culture, blood (routine x 2)     Status: None   Collection Time: 03/15/17  5:23 PM  Result Value Ref Range Status   Specimen Description LEFT ANTECUBITAL  Final   Special Requests IN PEDIATRIC BOTTLE Blood Culture adequate volume  Final   Culture   Final    NO GROWTH 5 DAYS Performed at [facility], [place] [address], [place], [place] [address]    Report Status 03/20/2017 FINAL  Final  Respiratory Panel by PCR     Status: None   Collection Time: 03/19/17 12:16 PM  Result Value Ref Range Status   Adenovirus NOT DETECTED NOT DETECTED Final   Coronavirus 229E NOT DETECTED NOT DETECTED Final    Coronavirus HKU1 NOT DETECTED NOT DETECTED Final   Coronavirus NL63 NOT DETECTED NOT DETECTED Final   Coronavirus OC43 NOT DETECTED NOT DETECTED Final   Metapneumovirus NOT DETECTED NOT DETECTED Final   Rhinovirus / Enterovirus NOT DETECTED NOT DETECTED Final   Influenza A NOT DETECTED NOT DETECTED Final   Influenza B NOT DETECTED NOT DETECTED Final   Parainfluenza Virus 1 NOT DETECTED NOT DETECTED Final   Parainfluenza Virus 2 NOT DETECTED NOT DETECTED Final   Parainfluenza Virus 3 NOT DETECTED NOT DETECTED Final   Parainfluenza Virus 4 NOT DETECTED NOT DETECTED Final   Respiratory Syncytial Virus NOT DETECTED NOT DETECTED Final   Bordetella pertussis NOT DETECTED NOT DETECTED Final   Chlamydophila pneumoniae NOT DETECTED NOT DETECTED Final   Mycoplasma pneumoniae NOT DETECTED NOT DETECTED Final    Comment: Performed at [facility], [place] [address] [place], [place] [address]  Culture, expectorated sputum-assessment     Status: None   Collection Time: 03/19/17  4:05 PM  Result Value Ref Range Status   Specimen Description SPUTUM  Final   Special Requests NONE  Final   Sputum evaluation THIS SPECIMEN IS ACCEPTABLE FOR SPUTUM CULTURE  Final   Report Status 03/19/2017 FINAL  Final  Culture, respiratory (NON-Expectorated)     Status: None   Collection Time: 03/19/17  4:05 PM  Result Value Ref Range Status   Specimen Description SPUTUM  Final   Special Requests NONE Reflexed from [id number]  Final   Gram Stain   Final    FEW WBC PRESENT, PREDOMINANTLY MONONUCLEAR RARE SQUAMOUS EPITHELIAL CELLS PRESENT FEW GRAM POSITIVE COCCI IN PAIRS RARE GRAM POSITIVE RODS    Culture   Final    Consistent with normal respiratory flora. Performed at [facility], [place] [address] [place], [place] [address]    Report Status 03/21/2017 FINAL  Final   Time coordinating discharge: 35 minutes  SIGNED:  Kerney Elbe, DO [facility] 03/21/2017, 10:11 PM Pager [phone]  If  7PM-7AM, please contact night-coverage [email or web address] Password [email or web address]

## 2017-03-22 ENCOUNTER — Ambulatory Visit: Payer: PPO | Admitting: Nurse Practitioner

## 2017-03-22 ENCOUNTER — Ambulatory Visit: Payer: PPO

## 2017-03-22 ENCOUNTER — Other Ambulatory Visit: Payer: PPO

## 2017-03-22 DIAGNOSIS — J449 Chronic obstructive pulmonary disease, unspecified: Secondary | ICD-10-CM | POA: Diagnosis not present

## 2017-03-22 DIAGNOSIS — C341 Malignant neoplasm of upper lobe, bronchus or lung: Secondary | ICD-10-CM | POA: Diagnosis not present

## 2017-03-23 ENCOUNTER — Encounter: Payer: Self-pay | Admitting: Adult Health

## 2017-03-23 ENCOUNTER — Ambulatory Visit (INDEPENDENT_AMBULATORY_CARE_PROVIDER_SITE_OTHER): Payer: PPO | Admitting: Adult Health

## 2017-03-23 DIAGNOSIS — J189 Pneumonia, unspecified organism: Secondary | ICD-10-CM | POA: Diagnosis not present

## 2017-03-23 DIAGNOSIS — Y95 Nosocomial condition: Secondary | ICD-10-CM

## 2017-03-23 DIAGNOSIS — J449 Chronic obstructive pulmonary disease, unspecified: Secondary | ICD-10-CM

## 2017-03-23 DIAGNOSIS — J9611 Chronic respiratory failure with hypoxia: Secondary | ICD-10-CM | POA: Diagnosis not present

## 2017-03-23 DIAGNOSIS — D696 Thrombocytopenia, unspecified: Secondary | ICD-10-CM | POA: Diagnosis not present

## 2017-03-23 NOTE — Patient Instructions (Addendum)
Finish Levaquin .  Mucinex DM Twice daily  As needed  Cough/congestion .  Continue on Duoneb .Twice daily  .  Continue on Pulmicort Twice daily  .  Follow up with Dr. Julien Nordmann in 2 weeks as planned  Follow up Dr. Elsworth Soho  In 3 weeks with chest xray , if not available can see Clarabell Matsuoka NP .  Please contact office for sooner follow up if symptoms do not improve or worsen or seek emergency care

## 2017-03-23 NOTE — Assessment & Plan Note (Signed)
Controlled on current regimen  Unable to afford inhaler  Cont on duoneb/pulmicort neb Twice daily  .

## 2017-03-23 NOTE — Assessment & Plan Note (Signed)
Cont on o2 .  

## 2017-03-23 NOTE — Assessment & Plan Note (Signed)
HCAP on left  Improving  Finish abx  Check cxr on return in 3 weeks and As needed   Please contact office for sooner follow up if symptoms do not improve or worsen or seek emergency care

## 2017-03-23 NOTE — Progress Notes (Signed)
@Patient  ID: Renee Vance, female    DOB: November 26, 1940, 76 y.o.   MRN: [id number]  Chief Complaint  Patient presents with  . 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 Undergoing Concurrent chemoradiation.     TEST  Spirometry 04/2011 showed ratio 62 with FEV1 of 56%, FVC of 69% and DLCO 73% and corrected for alveolar volume.She was told that she has moderate COPD and was placed on Spiriva with which she is compliant  CT chest  01/2017 -moderate left effusion  03/23/2017 Follow up  : COPD , HCAP , O2 RF and post hospital follow up  Patient returns for a post hospital follow-up Patient has known lung cancer diagnosed in August 2017. She is undergoing concurrent chemotherapy and radiation. Patient was recently admitted last week for severe pancytopenia and PNA .  She was started on IV abx , tx HCAP . She was discharged on Levaquin. Since discharge she is feeling some better but is feeling weak and gets winded easily .  BREO and ANORO are too expensive.  She denies fever, chest pain , orthopnea or edema.   Patient has been having some difficulties with nosebleeds. She was referred to ENT. Nosebleeds are getting better. She remains of couamdin .    Allergies  Allergen Reactions  . Ampicillin Nausea And Vomiting    Has patient had a PCN reaction causing immediate rash, facial/tongue/throat swelling, SOB or lightheadedness with hypotension: no Has patient had a PCN reaction causing severe rash involving mucus membranes or skin necrosis: no Has patient had a PCN reaction that required hospitalization: no Has patient had a PCN reaction occurring within the last 10 years: no If all of the above  answers are "NO", then may proceed with Cephalosporin use  . Codeine Nausea And Vomiting     There is no immunization history on file for this patient.  Past Medical History:  Diagnosis Date  . 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 Bradycardia   . CAD (coronary artery disease)   . COPD (chronic obstructive pulmonary disease) ([place])   . Coronary atherosclerosis of native coronary artery 2011  . Encounter for antineoplastic chemotherapy 06/23/2016  . Fibrocystic breast   . History of radiation therapy 07/05/16 - 08/09/16    Left Lung: 45 Gy in 25 fractions  . HTN (hypertension)   . Hyperlipidemia   . Hypothyroidism   . Neutropenic fever ([place]) 03/15/2017  . Obesity   . Pain in joint   . Right renal mass 11/30/2016  . 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 Thrombocytopenia ([place]) 03/15/2017    Tobacco History: History  Smoking Status  . Former Smoker  . Packs/day: 0.50  . Years: 60.00  . Quit date: 04/10/2014  Smokeless Tobacco  . Never Used    Comment: june 2015   Counseling given: Not Answered   Outpatient Encounter Prescriptions as of 03/23/2017  Medication Sig  . amLODipine (NORVASC) 5 MG tablet Take 5 mg by mouth daily.  Marland Kitchen aspirin 81 MG tablet Take 81 mg by mouth daily.  Marland Kitchen atorvastatin (LIPITOR) 80 MG tablet Take 80 mg by mouth daily.  . benzonatate (TESSALON) 200 MG capsule  Take 1 capsule (200 mg total) by mouth 3 (three) times daily as needed for cough.  . budesonide (PULMICORT) 0.25 MG/2ML nebulizer solution Take 0.25 mg by nebulization 2 (two) times daily.  . feeding supplement (BOOST HIGH PROTEIN) LIQD Take 1 Container by mouth daily.  . fluticasone furoate-vilanterol (BREO ELLIPTA) 100-25 MCG/INH AEPB Inhale 1 puff into the lungs daily.  Marland Kitchen guaiFENesin (MUCINEX) 600 MG 12 hr tablet Take 2 tablets (1,200 mg total) by mouth 2 (two) times daily.  Marland Kitchen ipratropium-albuterol (DUONEB) 0.5-2.5 (3) MG/3ML SOLN Take 3 mLs by nebulization  every 6 (six) hours as needed.  Marland Kitchen levofloxacin (LEVAQUIN) 750 MG tablet Take 1 tablet (750 mg total) by mouth every other day.  . levothyroxine (SYNTHROID, LEVOTHROID) 125 MCG tablet Take 125 mcg by mouth daily before breakfast.   . magic mouthwash w/lidocaine SOLN Take 5 mLs by mouth 4 (four) times daily as needed for mouth pain.  . metoprolol succinate (TOPROL-XL) 50 MG 24 hr tablet Take 1 tablet (50 mg total) by mouth 2 (two) times daily.  Marland Kitchen omeprazole (PRILOSEC) 20 MG capsule Take 1 capsule (20 mg total) by mouth daily.  . OXYGEN Inhale 2 L into the lungs continuous.  . sodium chloride (OCEAN) 0.65 % SOLN nasal spray Place 2 sprays into both nostrils as needed for congestion.  . Vitamin D, Cholecalciferol, 1000 UNITS CAPS Take 1,000 Units by mouth daily.   . [DISCONTINUED] feeding supplement, ENSURE ENLIVE, (ENSURE ENLIVE) LIQD Take 237 mLs by mouth 2 (two) times daily between meals. (Patient not taking: Reported on 03/23/2017)  . [DISCONTINUED] furosemide (LASIX) 20 MG tablet Take 20 mg by mouth daily.   . [DISCONTINUED] Multiple Vitamins-Minerals (MULTIVITAMIN WITH MINERALS) tablet Take 1 tablet by mouth daily.   No facility-administered encounter medications on file as of 03/23/2017.      Review of Systems  Constitutional:   No  weight loss, night sweats,  Fevers, chills,  +fatigue, or  lassitude.  HEENT:   No headaches,  Difficulty swallowing,  Tooth/dental problems, or  Sore throat,                No sneezing, itching, ear ache, nasal congestion, post nasal drip,   CV:  No chest pain,  Orthopnea, PND, swelling in lower extremities, anasarca, dizziness, palpitations, syncope.   GI  No heartburn, indigestion, abdominal pain, nausea, vomiting, diarrhea, change in bowel habits, loss of appetite, bloody stools.   Resp:   No chest wall deformity  Skin: no rash or lesions.  GU: no dysuria, change in color of urine, no urgency or frequency.  No flank pain, no hematuria   MS:  No  joint pain or swelling.  No decreased range of motion.  No back pain.    Physical Exam  BP 140/60 (BP Location: Left Arm, Cuff Size: Normal)   Pulse 72   Ht 5\' 4"  (1.626 m)   Wt 146 lb 6.4 oz (66.4 kg)   SpO2 96%   BMI 25.13 kg/m   GEN: A/Ox3; pleasant , NAD, thin elderly female on O2    HEENT:  [place]/AT,  EACs-clear, TMs-wnl, NOSE-clear, THROAT-clear, no lesions, no postnasal drip or exudate noted.   NECK:  Supple w/ fair ROM; no JVD; normal carotid impulses w/o bruits; no thyromegaly or nodules palpated; no lymphadenopathy.    RESP  Decreased BS in bases , no accessory muscle use, no dullness to percussion  CARD:  RRR, no m/r/g, no peripheral edema, pulses intact, no cyanosis or clubbing.  GI:   Soft & nt; nml bowel sounds; no organomegaly or masses detected.   Musco: Warm bil, no deformities or joint swelling noted.   Neuro: alert, no focal deficits noted.    Skin: Warm, no lesions or rashes    Lab Results:   ProBNP No results found for: PROBNP  Imaging: Dg Chest 2 View  Result Date: 03/20/2017 CLINICAL DATA:  Cough, smoker EXAM: CHEST  2 VIEW COMPARISON:  Chest x-ray 03/15/2017 CT chest 03/03/2017 FINDINGS: There is bilateral diffuse mild interstitial thickening. There is left perihilar airspace disease unchanged compared to the prior scans. There is a small left pleural effusion. There is no pneumothorax. There is mild stable cardiomegaly. The osseous structures are unremarkable. IMPRESSION: 1. Left upper lobe focal airspace disease likely reflecting posttreatment changes. 2. Persistent bilateral interstitial thickening increased compared with 03/15/2017. Electronically Signed   By: Kathreen Devoid   On: 03/20/2017 09:33   Dg Chest 2 View  Result Date: 03/03/2017 CLINICAL DATA:  Hemoptysis and worsening shortness of breath EXAM: CHEST  2 VIEW COMPARISON:  CT chest 12/26/2016, radiograph 11/28/2016, 11/01/2016 FINDINGS: Small left pleural effusion or thickening. Diffuse  fibrosis. Parenchymal pattern on the right appears unchanged radiographically. Left upper and lower lobe and perihilar interstitial and alveolar opacity probably stable. Left perihilar region bronchiectasis. Slight worsened opacification at the left base. Stable cardiomediastinal silhouette with atherosclerosis. IMPRESSION: 1. Peripheral interstitial opacities in the right lung appear grossly unchanged compared to the most recent prior radiograph. 2. Bronchiectasis, interstitial and alveolar process in the left perihilar region also probably stable compared with prior chest CT from March 2018. 3. Slight increased density at the left lung base, cannot exclude superimposed pneumonia 4. Left pleural disease grossly stable Electronically Signed   By: Donavan Foil M.D.   On: 03/03/2017 14:18   Ct Chest Wo Contrast  Result Date: 03/03/2017 CLINICAL DATA:  History of lung carcinoma with current hemoptysis EXAM: CT CHEST WITHOUT CONTRAST TECHNIQUE: Multidetector CT imaging of the chest was performed following the standard protocol without IV contrast. COMPARISON:  01/18/2017, 12/26/2016 FINDINGS: Cardiovascular: Somewhat limited due to the lack of IV contrast. Aortic calcifications are seen without aneurysmal dilatation. Coronary calcifications are noted. No significant cardiac enlargement is seen. Mediastinum/Nodes: The thoracic inlet is within normal limits. Scattered small lymph nodes are noted within the mediastinum. These are stable from the prior examination and were not shown to be hypermetabolic on recent PET-CT. The esophagus as visualized is within normal limits. Lungs/Pleura: Chronic fibrotic changes are again identified bilaterally. Diffuse bronchiectatic changes are seen throughout the left lung with peripheral fibrotic changes stable from the recent PET-CT. Left-sided pleural effusion is again identified and stable. Upper Abdomen: Gall bladder has been surgically removed. The left adrenal nodule seen  previously is again identified. Musculoskeletal: Degenerative changes of the thoracic spine are seen. IMPRESSION: Stable left adrenal nodule. Stable chronic fibrotic changes in both lungs. Stable mediastinal lymph nodes are noted as well. Left pleural effusion. No definitive acute abnormality is seen. Electronically Signed   By: Inez Catalina M.D.   On: 03/03/2017 17:02   Dg Chest Port 1 View  Result Date: 03/15/2017 CLINICAL DATA:  Neutropenia EXAM: PORTABLE CHEST 1 VIEW COMPARISON:  03/03/2017, FINDINGS: Right lung is grossly clear. Slight interval increase in left mid lung interstitial and alveolar opacity compared to prior radiograph. No large effusion. Mild shift of mediastinal contents to the left. Stable enlarged cardiomediastinal silhouette with atherosclerosis. No pneumothorax. IMPRESSION: Slight increased interstitial and alveolar opacity  in the left mid lung since the prior radiograph. Otherwise no significant interval changes. Electronically Signed   By: Donavan Foil M.D.   On: 03/15/2017 22:03     Assessment & Plan:   COPD (chronic obstructive pulmonary disease) (H[facility]) Controlled on current regimen  Unable to afford inhaler  Cont on duoneb/pulmicort neb Twice daily  .    Chronic respiratory failure with hypoxia ([facility]) Cont on o2 .   Hospital acquired PNA HCAP on left  Improving  Finish abx  Check cxr on return in 3 weeks and As needed   Please contact office for sooner follow up if symptoms do not improve or worsen or seek emergency care       Rexene Edison, NP 03/23/2017

## 2017-03-25 NOTE — Progress Notes (Signed)
Reviewed & agree with plan  

## 2017-03-27 ENCOUNTER — Telehealth: Payer: Self-pay

## 2017-03-27 NOTE — Telephone Encounter (Signed)
Was in hospital last week. Tuesday 6/12 he said he would see her in 2 weeks when she was in hospital. Therefore she cancelled Wednesday 6/20 (though this has not been taken off schedule yet). She needs appt approx 6/26 to see Dr Julien Nordmann. She mentioned her chemo is not agreeing with her and may need to change it.

## 2017-03-28 ENCOUNTER — Ambulatory Visit: Payer: Self-pay | Admitting: *Deleted

## 2017-03-28 ENCOUNTER — Telehealth: Payer: Self-pay | Admitting: Medical Oncology

## 2017-03-28 DIAGNOSIS — I480 Paroxysmal atrial fibrillation: Secondary | ICD-10-CM

## 2017-03-28 NOTE — Telephone Encounter (Signed)
I confirmed with pt her appt tomorrow.

## 2017-03-29 ENCOUNTER — Ambulatory Visit: Payer: PPO

## 2017-03-29 ENCOUNTER — Ambulatory Visit (INDEPENDENT_AMBULATORY_CARE_PROVIDER_SITE_OTHER): Payer: PPO | Admitting: Nurse Practitioner

## 2017-03-29 ENCOUNTER — Telehealth: Payer: Self-pay | Admitting: *Deleted

## 2017-03-29 ENCOUNTER — Telehealth: Payer: Self-pay | Admitting: Internal Medicine

## 2017-03-29 ENCOUNTER — Encounter: Payer: Self-pay | Admitting: Nurse Practitioner

## 2017-03-29 ENCOUNTER — Other Ambulatory Visit (HOSPITAL_BASED_OUTPATIENT_CLINIC_OR_DEPARTMENT_OTHER): Payer: PPO

## 2017-03-29 ENCOUNTER — Other Ambulatory Visit: Payer: PPO

## 2017-03-29 ENCOUNTER — Encounter: Payer: Self-pay | Admitting: Internal Medicine

## 2017-03-29 ENCOUNTER — Ambulatory Visit (HOSPITAL_BASED_OUTPATIENT_CLINIC_OR_DEPARTMENT_OTHER): Payer: PPO | Admitting: Internal Medicine

## 2017-03-29 VITALS — BP 130/70 | HR 81 | Temp 97.7°F | Resp 22 | Ht 64.0 in | Wt 147.7 lb

## 2017-03-29 VITALS — BP 118/58 | HR 86 | Ht 64.0 in | Wt 144.0 lb

## 2017-03-29 DIAGNOSIS — C3490 Malignant neoplasm of unspecified part of unspecified bronchus or lung: Secondary | ICD-10-CM | POA: Diagnosis not present

## 2017-03-29 DIAGNOSIS — R05 Cough: Secondary | ICD-10-CM

## 2017-03-29 DIAGNOSIS — N289 Disorder of kidney and ureter, unspecified: Secondary | ICD-10-CM

## 2017-03-29 DIAGNOSIS — D6481 Anemia due to antineoplastic chemotherapy: Secondary | ICD-10-CM

## 2017-03-29 DIAGNOSIS — I2510 Atherosclerotic heart disease of native coronary artery without angina pectoris: Secondary | ICD-10-CM

## 2017-03-29 DIAGNOSIS — R0602 Shortness of breath: Secondary | ICD-10-CM | POA: Diagnosis not present

## 2017-03-29 DIAGNOSIS — D696 Thrombocytopenia, unspecified: Secondary | ICD-10-CM

## 2017-03-29 DIAGNOSIS — C7972 Secondary malignant neoplasm of left adrenal gland: Secondary | ICD-10-CM

## 2017-03-29 DIAGNOSIS — C3492 Malignant neoplasm of unspecified part of left bronchus or lung: Secondary | ICD-10-CM | POA: Diagnosis not present

## 2017-03-29 DIAGNOSIS — J449 Chronic obstructive pulmonary disease, unspecified: Secondary | ICD-10-CM

## 2017-03-29 DIAGNOSIS — I10 Essential (primary) hypertension: Secondary | ICD-10-CM | POA: Diagnosis not present

## 2017-03-29 DIAGNOSIS — I5032 Chronic diastolic (congestive) heart failure: Secondary | ICD-10-CM | POA: Diagnosis not present

## 2017-03-29 DIAGNOSIS — T451X5A Adverse effect of antineoplastic and immunosuppressive drugs, initial encounter: Secondary | ICD-10-CM

## 2017-03-29 LAB — COMPREHENSIVE METABOLIC PANEL
ALBUMIN: 2.4 g/dL — AB (ref 3.5–5.0)
ALK PHOS: 82 U/L (ref 40–150)
ALT: 32 U/L (ref 0–55)
AST: 42 U/L — ABNORMAL HIGH (ref 5–34)
Anion Gap: 12 mEq/L — ABNORMAL HIGH (ref 3–11)
BUN: 25.1 mg/dL (ref 7.0–26.0)
CALCIUM: 9.1 mg/dL (ref 8.4–10.4)
CHLORIDE: 101 meq/L (ref 98–109)
CO2: 28 mEq/L (ref 22–29)
CREATININE: 1.9 mg/dL — AB (ref 0.6–1.1)
EGFR: 25 mL/min/{1.73_m2} — ABNORMAL LOW (ref >90)
Glucose: 200 mg/dl — ABNORMAL HIGH (ref 70–140)
Potassium: 3.5 mEq/L (ref 3.5–5.1)
Sodium: 141 mEq/L (ref 136–145)
TOTAL PROTEIN: 6.9 g/dL (ref 6.4–8.3)
Total Bilirubin: 0.51 mg/dL (ref 0.20–1.20)

## 2017-03-29 LAB — CBC WITH DIFFERENTIAL/PLATELET
BASO%: 0.6 % (ref 0.0–2.0)
BASOS ABS: 0.1 10*3/uL (ref 0.0–0.1)
EOS%: 0.1 % (ref 0.0–7.0)
Eosinophils Absolute: 0 10*3/uL (ref 0.0–0.5)
HEMATOCRIT: 23.7 % — AB (ref 34.8–46.6)
HEMOGLOBIN: 7.9 g/dL — AB (ref 11.6–15.9)
LYMPH#: 0.5 10*3/uL — AB (ref 0.9–3.3)
LYMPH%: 4 % — ABNORMAL LOW (ref 14.0–49.7)
MCH: 28.7 pg (ref 25.1–34.0)
MCHC: 33.4 g/dL (ref 31.5–36.0)
MCV: 85.9 fL (ref 79.5–101.0)
MONO#: 1.4 10*3/uL — AB (ref 0.1–0.9)
MONO%: 12.4 % (ref 0.0–14.0)
NEUT#: 9.6 10*3/uL — ABNORMAL HIGH (ref 1.5–6.5)
NEUT%: 82.9 % — AB (ref 38.4–76.8)
PLATELETS: 255 10*3/uL (ref 145–400)
RBC: 2.76 10*6/uL — ABNORMAL LOW (ref 3.70–5.45)
RDW: 15 % — ABNORMAL HIGH (ref 11.2–14.5)
WBC: 11.6 10*3/uL — ABNORMAL HIGH (ref 3.9–10.3)

## 2017-03-29 NOTE — Telephone Encounter (Signed)
Left message on voicemail instructing pt to come in at 1130 on 6/21. There is no availability at 0800.

## 2017-03-29 NOTE — Telephone Encounter (Signed)
Scheduled appt per 6/20 los - Gave patient AVS and calender per los.

## 2017-03-29 NOTE — Patient Instructions (Addendum)
We will be checking the following labs today - NONE   Medication Instructions:    Continue with your current medicines.     Testing/Procedures To Be Arranged:  N/A  Follow-Up:   See me in 6 months.    Other Special Instructions:   N/A    If you need a refill on your cardiac medications before your next appointment, please call your pharmacy.   Call the [facility] office at [phone] if you have any questions, problems or concerns.      

## 2017-03-29 NOTE — Progress Notes (Signed)
CARDIOLOGY OFFICE NOTE  Date:  03/29/2017    Renee Vance Date of Birth: [birth year] Medical Record #[id number]  PCP:  [name], PA-C  Cardiologist:  Jennings Books  Chief Complaint  Patient presents with  . Atrial Fibrillation    Follow up visit - seen for Dr. Tamala Julian    History of Present Illness: Renee Vance is a 76 y.o. female who presents today for a follow up visit. Seen for Dr. Tamala Julian.   She has a history of paroxysmal atrial fibrillation, coronary artery disease with drug-eluting stent in the distal RCA and intermediate stenosis in the LAD had cath in 2011, COPD, hyperlipidemia, obesity, and relatively recent diagnosis of squamous cell carcinoma of the left upper lobe. She is status post radiochemotherapy.  She was being considered for left upper lobectomy - which at a prior visit - she had decided to not have. FEV1 is 70%.  Seen in January by Dr. Tamala Julian as a pre op clearance - no cardiac complaints. She does have DOE with minimal activity. She decided to NOT proceed with her lobectomy. Dr. Tamala Julian had wanted to pursue Myoview if she was going to proceed. She got admitted back in January shortly after her visit here with flu/pnemonia/hypoxia. Echo with EF 60 to 65% with grade 2 DD noted.  I then saw her in February - she was doing better clinically.   Admitted in May and June - had with neutropenia, hemoptysis and fever. Treated with antibiotics. Coumadin stopped by Dr. Tamala Julian after the admission back in May due to hemoptysis. Felt that the risk of her bleeding was now to great.   Comes in today. Here with her husband. Oxygen in place. Quite hypoxic on arrival and short of breath. Sats down in the 70 - slowly up to high 80's. She says her heart is doing ok. No palpitations. Rhythm looks ok. Seeing oncology later today. Hoping her counts have continued to improve. Apparently they will talk about what the next plan is for her in regards to her treatment. She  tries to be as mobile as she can.   Past Medical History:  Diagnosis Date  . 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 Bradycardia   . CAD (coronary artery disease)   . COPD (chronic obstructive pulmonary disease) ([place])   . Coronary atherosclerosis of native coronary artery 2011  . Encounter for antineoplastic chemotherapy 06/23/2016  . Fibrocystic breast   . History of radiation therapy 07/05/16 - 08/09/16    Left Lung: 45 Gy in 25 fractions  . HTN (hypertension)   . Hyperlipidemia   . Hypothyroidism   . Neutropenic fever ([place]) 03/15/2017  . Obesity   . Pain in joint   . Right renal mass 11/30/2016  . 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 Thrombocytopenia ([place]) 03/15/2017    Past Surgical History:  Procedure Laterality Date  . CHOLECYSTECTOMY    . CORONARY ANGIOPLASTY WITH STENT PLACEMENT  2011   Lmain 30-40%, LAD 65-75% (FFR 0.88), CFX 55-60%, RCA 95%>0 w/ 2.5 x 12 mm monorail stent  . TUBAL LIGATION    . VIDEO BRONCHOSCOPY Bilateral 06/07/2016   Procedure: VIDEO BRONCHOSCOPY WITH FLUORO;  Surgeon: [name], MD;  Location: [facility];  Service: Cardiopulmonary;  Laterality: Bilateral;     Medications: Current Outpatient Prescriptions  Medication Sig Dispense Refill  . amLODipine (NORVASC) 5 MG tablet Take 5 mg by mouth daily.    Marland Kitchen aspirin  81 MG tablet Take 81 mg by mouth daily.    Marland Kitchen atorvastatin (LIPITOR) 80 MG tablet Take 80 mg by mouth daily.    . benzonatate (TESSALON) 200 MG capsule Take 1 capsule (200 mg total) by mouth 3 (three) times daily as needed for cough. 20 capsule 0  . budesonide (PULMICORT) 0.25 MG/2ML nebulizer solution Take 0.25 mg by nebulization 2 (two) times daily.    . feeding supplement (BOOST HIGH PROTEIN) LIQD Take 1 Container by mouth daily.    Marland Kitchen guaiFENesin (MUCINEX) 600 MG 12 hr tablet Take 2 tablets (1,200 mg total) by mouth 2 (two) times daily. 20 tablet 0  . ipratropium-albuterol (DUONEB) 0.5-2.5 (3)  MG/3ML SOLN Take 3 mLs by nebulization every 6 (six) hours as needed. 360 mL 0  . levothyroxine (SYNTHROID, LEVOTHROID) 125 MCG tablet Take 125 mcg by mouth daily before breakfast.     . magic mouthwash w/lidocaine SOLN Take 5 mLs by mouth 4 (four) times daily as needed for mouth pain. 60 mL 0  . metoprolol succinate (TOPROL-XL) 50 MG 24 hr tablet Take 1 tablet (50 mg total) by mouth 2 (two) times daily. 180 tablet 1  . omeprazole (PRILOSEC) 20 MG capsule Take 1 capsule (20 mg total) by mouth daily. 30 capsule 2  . OXYGEN Inhale 2 L into the lungs continuous.    . sodium chloride (OCEAN) 0.65 % SOLN nasal spray Place 2 sprays into both nostrils as needed for congestion. 1 Bottle 0  . Vitamin D, Cholecalciferol, 1000 UNITS CAPS Take 1,000 Units by mouth daily.      No current facility-administered medications for this visit.     Allergies: Allergies  Allergen Reactions  . Ampicillin Nausea And Vomiting    Has patient had a PCN reaction causing immediate rash, facial/tongue/throat swelling, SOB or lightheadedness with hypotension: no Has patient had a PCN reaction causing severe rash involving mucus membranes or skin necrosis: no Has patient had a PCN reaction that required hospitalization: no Has patient had a PCN reaction occurring within the last 10 years: no If all of the above answers are "NO", then may proceed with Cephalosporin use  . Codeine Nausea And Vomiting    Social History: The patient  reports that she quit smoking about 2 years ago. She has a 30.00 pack-year smoking history. She has never used smokeless tobacco. She reports that she does not drink alcohol or use drugs.   Family History: The patient's family history includes Heart disease in her mother.   Review of Systems: Please see the history of present illness.   Otherwise, the review of systems is positive for none.   All other systems are reviewed and negative.   Physical Exam: VS:  BP (!) 118/58 (BP Location:  Left Arm, Patient Position: Sitting, Cuff Size: Normal)   Pulse 86   Ht 5\' 4"  (1.626 m)   Wt 144 lb (65.3 kg)   SpO2 (!) 77% Comment: when pt walked in on 2L/deep breaths 93  BMI 24.72 kg/m  .  BMI Body mass index is 24.72 kg/m.  Wt Readings from Last 3 Encounters:  03/29/17 144 lb (65.3 kg)  03/23/17 146 lb 6.4 oz (66.4 kg)  03/20/17 145 lb 1 oz (65.8 kg)    General: Pleasant. Well developed, well nourished and in no acute distress.   HEENT: Normal.  Neck: Supple, no JVD, carotid bruits, or masses noted.  Cardiac: Regular rate and rhythm. No murmurs, rubs, or gallops. No edema.  Respiratory:  Lungs are clear to auscultation bilaterally with normal work of breathing.  GI: Soft and nontender.  MS: No deformity or atrophy. Gait and ROM intact.  Skin: Warm and dry. Color is normal.  Neuro:  Strength and sensation are intact and no gross focal deficits noted.  Psych: Alert, appropriate and with normal affect.   LABORATORY DATA:  EKG:  EKG is not ordered today.  Lab Results  Component Value Date   WBC 4.0 03/21/2017   HGB 8.7 (L) 03/21/2017   HCT 26.0 (L) 03/21/2017   PLT 60 (L) 03/21/2017   GLUCOSE 89 03/21/2017   ALT 28 03/21/2017   AST 40 03/21/2017   NA 140 03/21/2017   K 3.9 03/21/2017   CL 105 03/21/2017   CREATININE 1.51 (H) 03/21/2017   BUN 22 (H) 03/21/2017   CO2 29 03/21/2017   TSH 0.619 03/17/2017   INR 1.0 03/09/2017   HGBA1C 5.7 (H) 10/30/2016     BNP (last 3 results)  Recent Labs  10/30/16 0529  BNP 963.1*    ProBNP (last 3 results) No results for input(s): PROBNP in the last 8760 hours.   Other Studies Reviewed Today:  Echo Study Conclusions from 10/2016  - Left ventricle: The cavity size was normal. Wall thickness was increased in a pattern of mild LVH. Systolic function was normal. The estimated ejection fraction was in the range of 60% to 65%. Wall motion was normal; there were no regional wall motion abnormalities. Features  are consistent with a pseudonormal left ventricular filling pattern, with concomitant abnormal relaxation and increased filling pressure (grade 2 diastolic dysfunction). - Aortic valve: Trileaflet; mildly calcified leaflets. Mean gradient (S): 12 mm Hg. Peak gradient (S): 24 mm Hg. VTI ratio of LVOT to aortic valve: 0.77. - Mitral valve: Calcified annulus. There was trivial regurgitation. - Left atrium: The atrium was at the upper limits of normal in size. - Right atrium: Central venous pressure (est): 3 mm Hg. - Tricuspid valve: There was trivial regurgitation. - Pulmonary arteries: PA peak pressure: 28 mm Hg (S). - Pericardium, extracardiac: A prominent pericardial fat pad was present.  Impressions:  - Moderate LVH with LVEF 60-65% and grade 2 diastolic dysfunction. Upper normal atrial chamber size. Calcified mitral annulus with trivial mitral regurgitation. Moderately sclerotic aortic valve. Trivial tricuspid regurgitation with PASP 28 mmHg.  Assessment/Plan:  1. Chronic diastolic HF - occurred in the setting of flu/pneumonia   2. HTN - her BP is fine here today. I have left her on her current regimen.   3. CAD - no active chest pain. Would continue with her current medical management. No further testing felt to be warranted at this time.   4. Emphysema/chronic dyspnea - on chronic oxygen therapy.   5. Lung cancer - she has declined lobectomy. Has not tolerated chemo - recent admissions for bleeding and neutropenia noted. Seeing oncology later today.   6. CKD   7. PAF - no longer on anticoagulation - the risks are felt to outweigh the benefits at this time. Fortunately, she remains in NSR.   Current medicines are reviewed with the patient today.  The patient does not have concerns regarding medicines other than what has been noted above.  The following changes have been made:  See above.  Labs/ tests ordered today include:   No orders of the  defined types were placed in this encounter.    Disposition:   FU with me in 6 months.   Patient is agreeable to this  plan and will call if any problems develop in the interim.   SignedTruitt Merle, NP  03/29/2017 11:27 AM  [facility] [address] [place], [place]  [address] Phone: [phone] Fax: [phone]

## 2017-03-29 NOTE — Progress Notes (Signed)
[facility] [phone]   Fax:([phone]  OFFICE PROGRESS NOTE  [name], PA-C [place] [address]  DIAGNOSIS: Metastatic non-small cell lung cancer initially diagnosed as Stage IIB (T2a, N1, M0) non-small cell lung cancer, squamous cell carcinoma presented with left hilar mass diagnosed in August 2017.  PRIOR THERAPY: Concurrent chemoradiation with weekly carboplatin for AUC of 2 and paclitaxel 45 MG/M2 started 07/05/2016, status post 5 cycles. Last cycle was given 08/01/2016.  CURRENT THERAPY: Systemic chemotherapy with carboplatin for AUC of 5 on day 1 and gemcitabine 1000 MG/M2 on days 1 and 8 every 3 weeks. Status post one cycle. Starting from cycle #2 carboplatin will be reduced to AUC of 3 on day 1 and gemcitabine 800 MG/M2 on days 1 and 8 every 3 weeks secondary to intolerance to the standard doses.  INTERVAL HISTORY: Renee Vance 76 y.o. female returns to the clinic today for follow-up visit accompanied by her husband. The patient is feeling fine today with no specific complaints except for fatigue and the baseline shortness of breath. She was seen by her cardiologist for evaluation of congestive heart failure. She was recently admitted to [facility] with pancytopenia and neutropenic fever after the first cycle of her systemic chemotherapy. She required PRBCs and platelet transfusion. She was also started on treatment with Granix during her admission. She is feeling much better today. She denied having any chest pain but continues to have shortness of breath and cough with no hemoptysis. She has no nausea, vomiting, diarrhea or constipation. She denied having any fever or chills. She is here today for reevaluation and discussion of her treatment options.   MEDICAL HISTORY: Past Medical History:  Diagnosis Date  . 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 Bradycardia   . CAD (coronary  artery disease)   . COPD (chronic obstructive pulmonary disease) ([name])   . Coronary atherosclerosis of native coronary artery 2011  . Encounter for antineoplastic chemotherapy 06/23/2016  . Fibrocystic breast   . History of radiation therapy 07/05/16 - 08/09/16    Left Lung: 45 Gy in 25 fractions  . HTN (hypertension)   . Hyperlipidemia   . Hypothyroidism   . Neutropenic fever ([name]) 03/15/2017  . Obesity   . Pain in joint   . Right renal mass 11/30/2016  . 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 Thrombocytopenia ([name]) 03/15/2017    ALLERGIES:  is allergic to ampicillin and codeine.  MEDICATIONS:  Current Outpatient Prescriptions  Medication Sig Dispense Refill  . amLODipine (NORVASC) 5 MG tablet Take 5 mg by mouth daily.    Marland Kitchen aspirin 81 MG tablet Take 81 mg by mouth daily.    Marland Kitchen atorvastatin (LIPITOR) 80 MG tablet Take 80 mg by mouth daily.    . benzonatate (TESSALON) 200 MG capsule Take 1 capsule (200 mg total) by mouth 3 (three) times daily as needed for cough. 20 capsule 0  . budesonide (PULMICORT) 0.25 MG/2ML nebulizer solution Take 0.25 mg by nebulization 2 (two) times daily.    . feeding supplement (BOOST HIGH PROTEIN) LIQD Take 1 Container by mouth daily.    Marland Kitchen guaiFENesin (MUCINEX) 600 MG 12 hr tablet Take 2 tablets (1,200 mg total) by mouth 2 (two) times daily. 20 tablet 0  . ipratropium-albuterol (DUONEB) 0.5-2.5 (3) MG/3ML SOLN Take 3 mLs by nebulization every 6 (six) hours as needed. 360 mL 0  . levothyroxine (  SYNTHROID, LEVOTHROID) 125 MCG tablet Take 125 mcg by mouth daily before breakfast.     . magic mouthwash w/lidocaine SOLN Take 5 mLs by mouth 4 (four) times daily as needed for mouth pain. 60 mL 0  . metoprolol succinate (TOPROL-XL) 50 MG 24 hr tablet Take 1 tablet (50 mg total) by mouth 2 (two) times daily. 180 tablet 1  . omeprazole (PRILOSEC) 20 MG capsule Take 1 capsule (20 mg total) by mouth daily. 30 capsule 2  . OXYGEN Inhale 2 L into the  lungs continuous.    . sodium chloride (OCEAN) 0.65 % SOLN nasal spray Place 2 sprays into both nostrils as needed for congestion. 1 Bottle 0  . Vitamin D, Cholecalciferol, 1000 UNITS CAPS Take 1,000 Units by mouth daily.      No current facility-administered medications for this visit.     SURGICAL HISTORY:  Past Surgical History:  Procedure Laterality Date  . CHOLECYSTECTOMY    . CORONARY ANGIOPLASTY WITH STENT PLACEMENT  2011   Lmain 30-40%, LAD 65-75% (FFR 0.88), CFX 55-60%, RCA 95%>0 w/ 2.5 x 12 mm monorail stent  . TUBAL LIGATION    . VIDEO BRONCHOSCOPY Bilateral 06/07/2016   Procedure: VIDEO BRONCHOSCOPY WITH FLUORO;  Surgeon: [name], MD;  Location: [facility];  Service: Cardiopulmonary;  Laterality: Bilateral;    REVIEW OF SYSTEMS:  Constitutional: positive for fatigue Eyes: negative Ears, nose, mouth, throat, and face: negative Respiratory: positive for cough and dyspnea on exertion Cardiovascular: negative Gastrointestinal: negative Genitourinary:negative Integument/breast: negative Hematologic/lymphatic: negative Musculoskeletal:positive for muscle weakness Neurological: negative Behavioral/Psych: negative Endocrine: negative Allergic/Immunologic: negative   PHYSICAL EXAMINATION: General appearance: alert, cooperative, fatigued and no distress Head: Normocephalic, without obvious abnormality, atraumatic Neck: no adenopathy, no JVD, supple, symmetrical, trachea midline and thyroid not enlarged, symmetric, no tenderness/mass/nodules Lymph nodes: Cervical, supraclavicular, and axillary nodes normal. Resp: rhonchi bilaterally and wheezes bilaterally Back: symmetric, no curvature. ROM normal. No CVA tenderness. Cardio: regular rate and rhythm, S1, S2 normal, no murmur, click, rub or gallop GI: soft, non-tender; bowel sounds normal; no masses,  no organomegaly Extremities: extremities normal, atraumatic, no cyanosis or edema Neurologic: Alert and oriented X 3,  normal strength and tone. Normal symmetric reflexes. Normal coordination and gait  ECOG PERFORMANCE STATUS: 1 - Symptomatic but completely ambulatory  Blood pressure 130/70, pulse 81, temperature 97.7 F (36.5 C), resp. rate (!) 22, height 5\' 4"  (1.626 m), weight 147 lb 11.2 oz (67 kg), SpO2 94 %.  LABORATORY DATA: Lab Results  Component Value Date   WBC 11.6 (H) 03/29/2017   HGB 7.9 (L) 03/29/2017   HCT 23.7 (L) 03/29/2017   MCV 85.9 03/29/2017   PLT 255 03/29/2017      Chemistry      Component Value Date/Time   NA 141 03/29/2017 1524   K 3.5 03/29/2017 1524   CL 105 03/21/2017 0601   CO2 28 03/29/2017 1524   BUN 25.1 03/29/2017 1524   CREATININE 1.9 (H) 03/29/2017 1524      Component Value Date/Time   CALCIUM 9.1 03/29/2017 1524   ALKPHOS 82 03/29/2017 1524   AST 42 (H) 03/29/2017 1524   ALT 32 03/29/2017 1524   BILITOT 0.51 03/29/2017 1524       RADIOGRAPHIC STUDIES: Dg Chest 2 View  Result Date: 03/20/2017 CLINICAL DATA:  Cough, smoker EXAM: CHEST  2 VIEW COMPARISON:  Chest x-ray 03/15/2017 CT chest 03/03/2017 FINDINGS: There is bilateral diffuse mild interstitial thickening. There is left perihilar airspace disease unchanged  compared to the prior scans. There is a small left pleural effusion. There is no pneumothorax. There is mild stable cardiomegaly. The osseous structures are unremarkable. IMPRESSION: 1. Left upper lobe focal airspace disease likely reflecting posttreatment changes. 2. Persistent bilateral interstitial thickening increased compared with 03/15/2017. Electronically Signed   By: Kathreen Devoid   On: 03/20/2017 09:33   Dg Chest 2 View  Result Date: 03/03/2017 CLINICAL DATA:  Hemoptysis and worsening shortness of breath EXAM: CHEST  2 VIEW COMPARISON:  CT chest 12/26/2016, radiograph 11/28/2016, 11/01/2016 FINDINGS: Small left pleural effusion or thickening. Diffuse fibrosis. Parenchymal pattern on the right appears unchanged radiographically. Left upper  and lower lobe and perihilar interstitial and alveolar opacity probably stable. Left perihilar region bronchiectasis. Slight worsened opacification at the left base. Stable cardiomediastinal silhouette with atherosclerosis. IMPRESSION: 1. Peripheral interstitial opacities in the right lung appear grossly unchanged compared to the most recent prior radiograph. 2. Bronchiectasis, interstitial and alveolar process in the left perihilar region also probably stable compared with prior chest CT from March 2018. 3. Slight increased density at the left lung base, cannot exclude superimposed pneumonia 4. Left pleural disease grossly stable Electronically Signed   By: Donavan Foil M.D.   On: 03/03/2017 14:18   Ct Chest Wo Contrast  Result Date: 03/03/2017 CLINICAL DATA:  History of lung carcinoma with current hemoptysis EXAM: CT CHEST WITHOUT CONTRAST TECHNIQUE: Multidetector CT imaging of the chest was performed following the standard protocol without IV contrast. COMPARISON:  01/18/2017, 12/26/2016 FINDINGS: Cardiovascular: Somewhat limited due to the lack of IV contrast. Aortic calcifications are seen without aneurysmal dilatation. Coronary calcifications are noted. No significant cardiac enlargement is seen. Mediastinum/Nodes: The thoracic inlet is within normal limits. Scattered small lymph nodes are noted within the mediastinum. These are stable from the prior examination and were not shown to be hypermetabolic on recent PET-CT. The esophagus as visualized is within normal limits. Lungs/Pleura: Chronic fibrotic changes are again identified bilaterally. Diffuse bronchiectatic changes are seen throughout the left lung with peripheral fibrotic changes stable from the recent PET-CT. Left-sided pleural effusion is again identified and stable. Upper Abdomen: Gall bladder has been surgically removed. The left adrenal nodule seen previously is again identified. Musculoskeletal: Degenerative changes of the thoracic spine  are seen. IMPRESSION: Stable left adrenal nodule. Stable chronic fibrotic changes in both lungs. Stable mediastinal lymph nodes are noted as well. Left pleural effusion. No definitive acute abnormality is seen. Electronically Signed   By: Inez Catalina M.D.   On: 03/03/2017 17:02   Dg Chest Port 1 View  Result Date: 03/15/2017 CLINICAL DATA:  Neutropenia EXAM: PORTABLE CHEST 1 VIEW COMPARISON:  03/03/2017, FINDINGS: Right lung is grossly clear. Slight interval increase in left mid lung interstitial and alveolar opacity compared to prior radiograph. No large effusion. Mild shift of mediastinal contents to the left. Stable enlarged cardiomediastinal silhouette with atherosclerosis. No pneumothorax. IMPRESSION: Slight increased interstitial and alveolar opacity in the left mid lung since the prior radiograph. Otherwise no significant interval changes. Electronically Signed   By: Donavan Foil M.D.   On: 03/15/2017 22:03    ASSESSMENT AND PLAN:  This is a very pleasant 76 years old white female with metastatic non-small cell lung cancer that was initially diagnosed as unresectable a stage IIB squamous cell carcinoma status post course of concurrent chemoradiation with weekly carboplatin and paclitaxel. She was unable to receive consolidation chemotherapy at that time because of poor performance status and complete location from the previous treatment with radiation.  The patient had evidence for metastatic disease based on biopsy of the left adrenal gland with PDL 1 expression of 0% She was started on systemic chemotherapy with carboplatin and gemcitabine status post one cycle. Her treatment was complicated with significant pancytopenia and neutropenic fever requiring hospitalization and transfusion of PRBCs and platelets. Her second cycle was delayed for the last 2 weeks. I had a lengthy discussion with the patient today about her condition and further treatment options. I discussed with the patient  continuing her treatment with carboplatin and gemcitabine but at a reduced dose versus treatment with single agent gemcitabine versus palliative care and hospice referral. After discussion of the options the patient is a still interested in treatment and she would continue on the same regimen was carboplatin but for AUC of 3 on day 1 and gemcitabine 800 MG/M2 on days 1 and 8 every 3 weeks. If she has intolerance to this treatment, we will discontinue her treatment and consider the patient for ease of palliative care and hospice referral or treatment with immunotherapy. She is expected to start her second cycle of this treatment next week. For the chemotherapy-induced anemia, I will arrange for the patient to receive 2 units of PRBCs transfusion tomorrow. For the renal sufficiency, she was encouraged to increase her oral hydration and she will need referral to nephrology for evaluation of her condition but she mentioned that she has an appointment with a nephrologist at [facility] next week. I will see the patient back for follow-up visit in 4 weeks for reevaluation before starting cycle #3. She was advised to call immediately if she has any concerning symptoms in the interval. The patient voices understanding of current disease status and treatment options and is in agreement with the current care plan. All questions were answered. The patient knows to call the clinic with any problems, questions or concerns. We can certainly see the patient much sooner if necessary.  Disclaimer: This note was dictated with voice recognition software. Similar sounding words can inadvertently be transcribed and may not be corrected upon review.

## 2017-03-30 ENCOUNTER — Ambulatory Visit (HOSPITAL_BASED_OUTPATIENT_CLINIC_OR_DEPARTMENT_OTHER): Payer: PPO

## 2017-03-30 ENCOUNTER — Other Ambulatory Visit: Payer: Self-pay | Admitting: *Deleted

## 2017-03-30 ENCOUNTER — Telehealth: Payer: Self-pay | Admitting: *Deleted

## 2017-03-30 ENCOUNTER — Other Ambulatory Visit (HOSPITAL_BASED_OUTPATIENT_CLINIC_OR_DEPARTMENT_OTHER): Payer: PPO

## 2017-03-30 ENCOUNTER — Ambulatory Visit (HOSPITAL_COMMUNITY)
Admission: RE | Admit: 2017-03-30 | Discharge: 2017-03-30 | Disposition: A | Discharge status: Home / Self Care | Payer: PPO | Source: Ambulatory Visit | Attending: Internal Medicine | Admitting: Internal Medicine

## 2017-03-30 DIAGNOSIS — C3492 Malignant neoplasm of unspecified part of left bronchus or lung: Secondary | ICD-10-CM

## 2017-03-30 DIAGNOSIS — D649 Anemia, unspecified: Secondary | ICD-10-CM | POA: Diagnosis not present

## 2017-03-30 DIAGNOSIS — D6481 Anemia due to antineoplastic chemotherapy: Secondary | ICD-10-CM

## 2017-03-30 LAB — PREPARE RBC (CROSSMATCH)

## 2017-03-30 MED ORDER — ACETAMINOPHEN 325 MG PO TABS
ORAL_TABLET | ORAL | Status: AC
  Filled 2017-03-30: qty 2

## 2017-03-30 MED ORDER — DIPHENHYDRAMINE HCL 25 MG PO CAPS
ORAL_CAPSULE | ORAL | Status: AC
  Filled 2017-03-30: qty 1

## 2017-03-30 MED ORDER — SODIUM CHLORIDE 0.9 % IV SOLN
250.0000 mL | Freq: Once | INTRAVENOUS | Status: AC
  Administered 2017-03-30: 250 mL via INTRAVENOUS

## 2017-03-30 MED ORDER — ACETAMINOPHEN 325 MG PO TABS
650.0000 mg | ORAL_TABLET | Freq: Once | ORAL | Status: AC
  Administered 2017-03-30: 650 mg via ORAL

## 2017-03-30 MED ORDER — DIPHENHYDRAMINE HCL 25 MG PO CAPS
25.0000 mg | ORAL_CAPSULE | Freq: Once | ORAL | Status: AC
  Administered 2017-03-30: 25 mg via ORAL

## 2017-03-30 NOTE — Telephone Encounter (Signed)
"  Could Renee Vance give me a call [phone]."  This nurse returned call.  Spouse "She's in the shower.  Needs to know what's going to happen today.  How many units of blood will be given?  Appointment was changed.  What time will she finish, blood takes seven hours."  Reviewed appointments, time it takes for crossmatch, pre-meds and blood transfusion process.  No further questions.

## 2017-03-30 NOTE — Patient Instructions (Signed)

## 2017-03-31 LAB — BPAM RBC
Blood Product Expiration Date: 201807052359
Blood Product Expiration Date: 201807052359
ISSUE DATE / TIME: 201806211318
ISSUE DATE / TIME: 201806211318
UNIT TYPE AND RH: 1700
Unit Type and Rh: 1700

## 2017-03-31 LAB — TYPE AND SCREEN
ABO/RH(D): B NEG
ANTIBODY SCREEN: NEGATIVE
UNIT DIVISION: 0
Unit division: 0

## 2017-04-03 DIAGNOSIS — N183 Chronic kidney disease, stage 3 (moderate): Secondary | ICD-10-CM | POA: Diagnosis not present

## 2017-04-03 DIAGNOSIS — N179 Acute kidney failure, unspecified: Secondary | ICD-10-CM | POA: Diagnosis not present

## 2017-04-03 DIAGNOSIS — M9080 Osteopathy in diseases classified elsewhere, unspecified site: Secondary | ICD-10-CM | POA: Diagnosis not present

## 2017-04-03 DIAGNOSIS — E559 Vitamin D deficiency, unspecified: Secondary | ICD-10-CM | POA: Diagnosis not present

## 2017-04-03 DIAGNOSIS — C3490 Malignant neoplasm of unspecified part of unspecified bronchus or lung: Secondary | ICD-10-CM | POA: Diagnosis not present

## 2017-04-03 DIAGNOSIS — I129 Hypertensive chronic kidney disease with stage 1 through stage 4 chronic kidney disease, or unspecified chronic kidney disease: Secondary | ICD-10-CM | POA: Diagnosis not present

## 2017-04-03 DIAGNOSIS — N281 Cyst of kidney, acquired: Secondary | ICD-10-CM | POA: Diagnosis not present

## 2017-04-03 DIAGNOSIS — E889 Metabolic disorder, unspecified: Secondary | ICD-10-CM | POA: Diagnosis not present

## 2017-04-04 DIAGNOSIS — M47812 Spondylosis without myelopathy or radiculopathy, cervical region: Secondary | ICD-10-CM | POA: Diagnosis not present

## 2017-04-04 DIAGNOSIS — M9901 Segmental and somatic dysfunction of cervical region: Secondary | ICD-10-CM | POA: Diagnosis not present

## 2017-04-04 DIAGNOSIS — C3492 Malignant neoplasm of unspecified part of left bronchus or lung: Secondary | ICD-10-CM | POA: Diagnosis not present

## 2017-04-04 DIAGNOSIS — S134XXA Sprain of ligaments of cervical spine, initial encounter: Secondary | ICD-10-CM | POA: Diagnosis not present

## 2017-04-04 DIAGNOSIS — M47816 Spondylosis without myelopathy or radiculopathy, lumbar region: Secondary | ICD-10-CM | POA: Diagnosis not present

## 2017-04-04 DIAGNOSIS — M546 Pain in thoracic spine: Secondary | ICD-10-CM | POA: Diagnosis not present

## 2017-04-04 DIAGNOSIS — J449 Chronic obstructive pulmonary disease, unspecified: Secondary | ICD-10-CM | POA: Diagnosis not present

## 2017-04-04 DIAGNOSIS — M9902 Segmental and somatic dysfunction of thoracic region: Secondary | ICD-10-CM | POA: Diagnosis not present

## 2017-04-04 DIAGNOSIS — M9903 Segmental and somatic dysfunction of lumbar region: Secondary | ICD-10-CM | POA: Diagnosis not present

## 2017-04-05 ENCOUNTER — Ambulatory Visit (HOSPITAL_BASED_OUTPATIENT_CLINIC_OR_DEPARTMENT_OTHER): Payer: PPO

## 2017-04-05 ENCOUNTER — Other Ambulatory Visit (HOSPITAL_BASED_OUTPATIENT_CLINIC_OR_DEPARTMENT_OTHER): Payer: PPO

## 2017-04-05 VITALS — BP 165/69 | HR 80 | Temp 97.9°F | Resp 20

## 2017-04-05 DIAGNOSIS — C7972 Secondary malignant neoplasm of left adrenal gland: Secondary | ICD-10-CM

## 2017-04-05 DIAGNOSIS — C3492 Malignant neoplasm of unspecified part of left bronchus or lung: Secondary | ICD-10-CM

## 2017-04-05 DIAGNOSIS — Z5111 Encounter for antineoplastic chemotherapy: Secondary | ICD-10-CM

## 2017-04-05 LAB — COMPREHENSIVE METABOLIC PANEL
ALT: 24 U/L (ref 0–55)
ANION GAP: 10 meq/L (ref 3–11)
AST: 27 U/L (ref 5–34)
Albumin: 2.6 g/dL — ABNORMAL LOW (ref 3.5–5.0)
Alkaline Phosphatase: 85 U/L (ref 40–150)
BILIRUBIN TOTAL: 0.43 mg/dL (ref 0.20–1.20)
BUN: 25.5 mg/dL (ref 7.0–26.0)
CHLORIDE: 104 meq/L (ref 98–109)
CO2: 30 meq/L — AB (ref 22–29)
Calcium: 9.6 mg/dL (ref 8.4–10.4)
Creatinine: 1.6 mg/dL — ABNORMAL HIGH (ref 0.6–1.1)
EGFR: 31 mL/min/{1.73_m2} — AB (ref >90)
GLUCOSE: 104 mg/dL (ref 70–140)
Potassium: 4.4 mEq/L (ref 3.5–5.1)
SODIUM: 144 meq/L (ref 136–145)
TOTAL PROTEIN: 7.4 g/dL (ref 6.4–8.3)

## 2017-04-05 LAB — CBC WITH DIFFERENTIAL/PLATELET
BASO%: 1.2 % (ref 0.0–2.0)
Basophils Absolute: 0.1 10*3/uL (ref 0.0–0.1)
EOS ABS: 0.1 10*3/uL (ref 0.0–0.5)
EOS%: 0.5 % (ref 0.0–7.0)
HCT: 34.4 % — ABNORMAL LOW (ref 34.8–46.6)
HGB: 10.9 g/dL — ABNORMAL LOW (ref 11.6–15.9)
LYMPH%: 12.7 % — AB (ref 14.0–49.7)
MCH: 28.2 pg (ref 25.1–34.0)
MCHC: 31.7 g/dL (ref 31.5–36.0)
MCV: 88.9 fL (ref 79.5–101.0)
MONO#: 1 10*3/uL — ABNORMAL HIGH (ref 0.1–0.9)
MONO%: 10.4 % (ref 0.0–14.0)
NEUT#: 6.9 10*3/uL — ABNORMAL HIGH (ref 1.5–6.5)
NEUT%: 75.2 % (ref 38.4–76.8)
PLATELETS: 419 10*3/uL — AB (ref 145–400)
RBC: 3.87 10*6/uL (ref 3.70–5.45)
RDW: 14.4 % (ref 11.2–14.5)
WBC: 9.2 10*3/uL (ref 3.9–10.3)
lymph#: 1.2 10*3/uL (ref 0.9–3.3)

## 2017-04-05 MED ORDER — SODIUM CHLORIDE 0.9 % IV SOLN
Freq: Once | INTRAVENOUS | Status: AC
Start: 2017-04-05 — End: 2017-04-05
  Administered 2017-04-05: 15:00:00 via INTRAVENOUS

## 2017-04-05 MED ORDER — SODIUM CHLORIDE 0.9 % IV SOLN
160.0000 mg | Freq: Once | INTRAVENOUS | Status: AC
  Administered 2017-04-05: 160 mg via INTRAVENOUS
  Filled 2017-04-05: qty 16

## 2017-04-05 MED ORDER — PALONOSETRON HCL INJECTION 0.25 MG/5ML
0.2500 mg | Freq: Once | INTRAVENOUS | Status: AC
  Administered 2017-04-05: 0.25 mg via INTRAVENOUS

## 2017-04-05 MED ORDER — DEXAMETHASONE SODIUM PHOSPHATE 10 MG/ML IJ SOLN
10.0000 mg | Freq: Once | INTRAMUSCULAR | Status: AC
  Administered 2017-04-05: 10 mg via INTRAVENOUS

## 2017-04-05 MED ORDER — SODIUM CHLORIDE 0.9 % IV SOLN
800.0000 mg/m2 | Freq: Once | INTRAVENOUS | Status: AC
  Administered 2017-04-05: 1368 mg via INTRAVENOUS
  Filled 2017-04-05: qty 35.98

## 2017-04-05 MED ORDER — PALONOSETRON HCL INJECTION 0.25 MG/5ML
INTRAVENOUS | Status: AC
  Filled 2017-04-05: qty 5

## 2017-04-05 MED ORDER — DEXAMETHASONE SODIUM PHOSPHATE 10 MG/ML IJ SOLN
INTRAMUSCULAR | Status: AC
  Filled 2017-04-05: qty 1

## 2017-04-05 NOTE — Patient Instructions (Signed)
[facility] Discharge Instructions for Patients Receiving Chemotherapy  Today you received the following chemotherapy agents:  Carboplatin, Gemzar  To help prevent nausea and vomiting after your treatment, we encourage you to take your nausea medication as prescribed.   If you develop nausea and vomiting that is not controlled by your nausea medication, call the clinic.   BELOW ARE SYMPTOMS THAT SHOULD BE REPORTED IMMEDIATELY:  *FEVER GREATER THAN 100.5 F  *CHILLS WITH OR WITHOUT FEVER  NAUSEA AND VOMITING THAT IS NOT CONTROLLED WITH YOUR NAUSEA MEDICATION  *UNUSUAL SHORTNESS OF BREATH  *UNUSUAL BRUISING OR BLEEDING  TENDERNESS IN MOUTH AND THROAT WITH OR WITHOUT PRESENCE OF ULCERS  *URINARY PROBLEMS  *BOWEL PROBLEMS  UNUSUAL RASH Items with * indicate a potential emergency and should be followed up as soon as possible.  Feel free to call the clinic you have any questions or concerns. The clinic phone number is [phone].  Please show the CHEMO ALERT CARD at check-in to the Emergency Department and triage nurse.   

## 2017-04-05 NOTE — Progress Notes (Signed)
Pr Dr Julien Nordmann ok to tx today with Creatinine of 1.6

## 2017-04-06 ENCOUNTER — Other Ambulatory Visit: Payer: Self-pay | Admitting: Radiology

## 2017-04-06 DIAGNOSIS — C3492 Malignant neoplasm of unspecified part of left bronchus or lung: Secondary | ICD-10-CM | POA: Diagnosis not present

## 2017-04-06 DIAGNOSIS — J449 Chronic obstructive pulmonary disease, unspecified: Secondary | ICD-10-CM | POA: Diagnosis not present

## 2017-04-07 ENCOUNTER — Other Ambulatory Visit: Payer: Self-pay | Admitting: Internal Medicine

## 2017-04-07 ENCOUNTER — Ambulatory Visit (HOSPITAL_COMMUNITY)
Admission: RE | Admit: 2017-04-07 | Discharge: 2017-04-07 | Disposition: A | Discharge status: Home / Self Care | Payer: PPO | Source: Ambulatory Visit | Attending: Internal Medicine | Admitting: Internal Medicine

## 2017-04-07 ENCOUNTER — Encounter (HOSPITAL_COMMUNITY): Payer: Self-pay

## 2017-04-07 DIAGNOSIS — C3492 Malignant neoplasm of unspecified part of left bronchus or lung: Secondary | ICD-10-CM | POA: Diagnosis not present

## 2017-04-07 DIAGNOSIS — E785 Hyperlipidemia, unspecified: Secondary | ICD-10-CM | POA: Insufficient documentation

## 2017-04-07 DIAGNOSIS — Z923 Personal history of irradiation: Secondary | ICD-10-CM | POA: Diagnosis not present

## 2017-04-07 DIAGNOSIS — J449 Chronic obstructive pulmonary disease, unspecified: Secondary | ICD-10-CM | POA: Diagnosis not present

## 2017-04-07 DIAGNOSIS — Z7982 Long term (current) use of aspirin: Secondary | ICD-10-CM | POA: Diagnosis not present

## 2017-04-07 DIAGNOSIS — Z9221 Personal history of antineoplastic chemotherapy: Secondary | ICD-10-CM | POA: Diagnosis not present

## 2017-04-07 DIAGNOSIS — Z7951 Long term (current) use of inhaled steroids: Secondary | ICD-10-CM | POA: Insufficient documentation

## 2017-04-07 DIAGNOSIS — Z6824 Body mass index (BMI) 24.0-24.9, adult: Secondary | ICD-10-CM | POA: Insufficient documentation

## 2017-04-07 DIAGNOSIS — Z452 Encounter for adjustment and management of vascular access device: Secondary | ICD-10-CM | POA: Diagnosis not present

## 2017-04-07 DIAGNOSIS — E039 Hypothyroidism, unspecified: Secondary | ICD-10-CM | POA: Diagnosis not present

## 2017-04-07 DIAGNOSIS — C7970 Secondary malignant neoplasm of unspecified adrenal gland: Secondary | ICD-10-CM | POA: Insufficient documentation

## 2017-04-07 DIAGNOSIS — I10 Essential (primary) hypertension: Secondary | ICD-10-CM | POA: Insufficient documentation

## 2017-04-07 DIAGNOSIS — Z880 Allergy status to penicillin: Secondary | ICD-10-CM | POA: Diagnosis not present

## 2017-04-07 DIAGNOSIS — I2510 Atherosclerotic heart disease of native coronary artery without angina pectoris: Secondary | ICD-10-CM | POA: Diagnosis not present

## 2017-04-07 DIAGNOSIS — Z955 Presence of coronary angioplasty implant and graft: Secondary | ICD-10-CM | POA: Insufficient documentation

## 2017-04-07 DIAGNOSIS — E669 Obesity, unspecified: Secondary | ICD-10-CM | POA: Insufficient documentation

## 2017-04-07 DIAGNOSIS — Z85118 Personal history of other malignant neoplasm of bronchus and lung: Secondary | ICD-10-CM | POA: Diagnosis not present

## 2017-04-07 HISTORY — PX: IR FLUORO GUIDE PORT INSERTION RIGHT: IMG5741

## 2017-04-07 HISTORY — PX: IR US GUIDE VASC ACCESS RIGHT: IMG2390

## 2017-04-07 LAB — BASIC METABOLIC PANEL
Anion gap: 10 (ref 5–15)
BUN: 36 mg/dL — AB (ref 6–20)
CALCIUM: 9.2 mg/dL (ref 8.9–10.3)
CO2: 28 mmol/L (ref 22–32)
CREATININE: 1.58 mg/dL — AB (ref 0.44–1.00)
Chloride: 103 mmol/L (ref 101–111)
GFR calc non Af Amer: 30 mL/min — ABNORMAL LOW (ref >60)
GFR, EST AFRICAN AMERICAN: 35 mL/min — AB (ref >60)
Glucose, Bld: 85 mg/dL (ref 65–99)
Potassium: 4.5 mmol/L (ref 3.5–5.1)
SODIUM: 141 mmol/L (ref 135–145)

## 2017-04-07 LAB — APTT: aPTT: 27 seconds (ref 24–36)

## 2017-04-07 LAB — CBC
HCT: 35.1 % — ABNORMAL LOW (ref 36.0–46.0)
Hemoglobin: 11.3 g/dL — ABNORMAL LOW (ref 12.0–15.0)
MCH: 28.2 pg (ref 26.0–34.0)
MCHC: 32.2 g/dL (ref 30.0–36.0)
MCV: 87.5 fL (ref 78.0–100.0)
PLATELETS: 483 10*3/uL — AB (ref 150–400)
RBC: 4.01 MIL/uL (ref 3.87–5.11)
RDW: 14.7 % (ref 11.5–15.5)
WBC: 11.3 10*3/uL — ABNORMAL HIGH (ref 4.0–10.5)

## 2017-04-07 LAB — PROTIME-INR
INR: 1.03
PROTHROMBIN TIME: 13.5 s (ref 11.4–15.2)

## 2017-04-07 MED ORDER — LIDOCAINE HCL 1 % IJ SOLN
INTRAMUSCULAR | Status: AC | PRN
  Administered 2017-04-07: 10 mL

## 2017-04-07 MED ORDER — FENTANYL CITRATE (PF) 100 MCG/2ML IJ SOLN
INTRAMUSCULAR | Status: AC | PRN
  Administered 2017-04-07 (×2): 50 ug via INTRAVENOUS

## 2017-04-07 MED ORDER — SODIUM CHLORIDE 0.9 % IV SOLN
INTRAVENOUS | Status: DC
  Administered 2017-04-07: 13:00:00 via INTRAVENOUS

## 2017-04-07 MED ORDER — CLINDAMYCIN PHOSPHATE 900 MG/50ML IV SOLN
900.0000 mg | Freq: Once | INTRAVENOUS | Status: AC
  Administered 2017-04-07: 900 mg via INTRAVENOUS
  Filled 2017-04-07 (×2): qty 50

## 2017-04-07 MED ORDER — MIDAZOLAM HCL 2 MG/2ML IJ SOLN
INTRAMUSCULAR | Status: AC
  Filled 2017-04-07: qty 4

## 2017-04-07 MED ORDER — LIDOCAINE HCL 1 % IJ SOLN
INTRAMUSCULAR | Status: AC
  Filled 2017-04-07: qty 20

## 2017-04-07 MED ORDER — HEPARIN SOD (PORK) LOCK FLUSH 100 UNIT/ML IV SOLN
INTRAVENOUS | Status: AC
  Filled 2017-04-07: qty 5

## 2017-04-07 MED ORDER — MIDAZOLAM HCL 2 MG/2ML IJ SOLN
INTRAMUSCULAR | Status: AC | PRN
  Administered 2017-04-07 (×2): 1 mg via INTRAVENOUS

## 2017-04-07 MED ORDER — FENTANYL CITRATE (PF) 100 MCG/2ML IJ SOLN
INTRAMUSCULAR | Status: AC
  Filled 2017-04-07: qty 4

## 2017-04-07 MED ORDER — HEPARIN SOD (PORK) LOCK FLUSH 100 UNIT/ML IV SOLN
INTRAVENOUS | Status: AC | PRN
  Administered 2017-04-07: 500 [IU] via INTRAVENOUS

## 2017-04-07 NOTE — Procedures (Signed)
Interventional Radiology Procedure Note  Procedure: Placement of a right IJ approach single lumen PowerPort.  Tip is positioned at the superior cavoatrial junction and catheter is ready for immediate use.  Complications: None Recommendations:  - Ok to shower tomorrow - Do not submerge for 7 days - Routine line care   Signed,  Leandra Vanderweele S. Catera Hankins, DO   

## 2017-04-07 NOTE — Discharge Instructions (Signed)
Moderate Conscious Sedation, Adult, Care After °These instructions provide you with information about caring for yourself after your procedure. Your health care provider may also give you more specific instructions. Your treatment has been planned according to current medical practices, but problems sometimes occur. Call your health care provider if you have any problems or questions after your procedure. °What can I expect after the procedure? °After your procedure, it is common: °· To feel sleepy for several hours. °· To feel clumsy and have poor balance for several hours. °· To have poor judgment for several hours. °· To vomit if you eat too soon. ° °Follow these instructions at home: °For at least 24 hours after the procedure: ° °· Do not: °? Participate in activities where you could fall or become injured. °? Drive. °? Use heavy machinery. °? Drink alcohol. °? Take sleeping pills or medicines that cause drowsiness. °? Make important decisions or sign legal documents. °? Take care of children on your own. °· Rest. °Eating and drinking °· Follow the diet recommended by your health care provider. °· If you vomit: °? Drink water, juice, or soup when you can drink without vomiting. °? Make sure you have little or no nausea before eating solid foods. °General instructions °· Have a responsible adult stay with you until you are awake and alert. °· Take over-the-counter and prescription medicines only as told by your health care provider. °· If you smoke, do not smoke without supervision. °· Keep all follow-up visits as told by your health care provider. This is important. °Contact a health care provider if: °· You keep feeling nauseous or you keep vomiting. °· You feel light-headed. °· You develop a rash. °· You have a fever. °Get help right away if: °· You have trouble breathing. °This information is not intended to replace advice given to you by your health care provider. Make sure you discuss any questions you have  with your health care provider. °Document Released: 07/17/2013 Document Revised: 02/29/2016 Document Reviewed: 01/16/2016 °Elsevier Interactive Patient Education © 2018 [employer] ° ° °Implanted Port Home Guide °An implanted port is a type of central line that is placed under the skin. Central lines are used to provide IV access when treatment or nutrition needs to be given through a person’s veins. Implanted ports are used for long-term IV access. An implanted port may be placed because: °· You need IV medicine that would be irritating to the small veins in your hands or arms. °· You need long-term IV medicines, such as antibiotics. °· You need IV nutrition for a long period. °· You need frequent blood draws for lab tests. °· You need dialysis. ° °Implanted ports are usually placed in the chest area, but they can also be placed in the upper arm, the abdomen, or the leg. An implanted port has two main parts: °· Reservoir. The reservoir is round and will appear as a small, raised area under your skin. The reservoir is the part where a needle is inserted to give medicines or draw blood. °· Catheter. The catheter is a thin, flexible tube that extends from the reservoir. The catheter is placed into a large vein. Medicine that is inserted into the reservoir goes into the catheter and then into the vein. ° °How will I care for my incision site? °Do not get the incision site wet. Bathe or shower as directed by your health care provider. °How is my port accessed? °Special steps must be taken to access the   port: °· Before the port is accessed, a numbing cream can be placed on the skin. This helps numb the skin over the port site. °· Your health care provider uses a sterile technique to access the port. °? Your health care provider must put on a mask and sterile gloves. °? The skin over your port is cleaned carefully with an antiseptic and allowed to dry. °? The port is gently pinched between sterile gloves, and a needle  is inserted into the port. °· Only "non-coring" port needles should be used to access the port. Once the port is accessed, a blood return should be checked. This helps ensure that the port is in the vein and is not clogged. °· If your port needs to remain accessed for a constant infusion, a clear (transparent) bandage will be placed over the needle site. The bandage and needle will need to be changed every week, or as directed by your health care provider. °· Keep the bandage covering the needle clean and dry. Do not get it wet. Follow your health care provider’s instructions on how to take a shower or bath while the port is accessed. °· If your port does not need to stay accessed, no bandage is needed over the port. ° °What is flushing? °Flushing helps keep the port from getting clogged. Follow your health care provider’s instructions on how and when to flush the port. Ports are usually flushed with saline solution or a medicine called heparin. The need for flushing will depend on how the port is used. °· If the port is used for intermittent medicines or blood draws, the port will need to be flushed: °? After medicines have been given. °? After blood has been drawn. °? As part of routine maintenance. °· If a constant infusion is running, the port may not need to be flushed. ° °How long will my port stay implanted? °The port can stay in for as long as your health care provider thinks it is needed. When it is time for the port to come out, surgery will be done to remove it. The procedure is similar to the one performed when the port was put in. °When should I seek immediate medical care? °When you have an implanted port, you should seek immediate medical care if: °· You notice a bad smell coming from the incision site. °· You have swelling, redness, or drainage at the incision site. °· You have more swelling or pain at the port site or the surrounding area. °· You have a fever that is not controlled with  medicine. ° °This information is not intended to replace advice given to you by your health care provider. Make sure you discuss any questions you have with your health care provider. °Document Released: 09/26/2005 Document Revised: 03/03/2016 Document Reviewed: 06/03/2013 °Elsevier Interactive Patient Education © 2017 [employer] ° ° °Implanted Port Insertion, Care After °This sheet gives you information about how to care for yourself after your procedure. Your health care provider may also give you more specific instructions. If you have problems or questions, contact your health care provider. °What can I expect after the procedure? °After your procedure, it is common to have: °· Discomfort at the port insertion site. °· Bruising on the skin over the port. This should improve over 3-4 days. ° °Follow these instructions at home: °Port care °· After your port is placed, you will get a manufacturer's information card. The card has information about your port. Keep this card with   you at all times. °· Take care of the port as told by your health care provider. Ask your health care provider if you or a family member can get training for taking care of the port at home. A home health care nurse may also take care of the port. °· Make sure to remember what type of port you have. °Incision care °· Follow instructions from your health care provider about how to take care of your port insertion site. Make sure you: °? Wash your hands with soap and water before you change your bandage (dressing). If soap and water are not available, use hand sanitizer. °? Change your dressing as told by your health care provider. °? Leave stitches (sutures), skin glue, or adhesive strips in place. These skin closures may need to stay in place for 2 weeks or longer. If adhesive strip edges start to loosen and curl up, you may trim the loose edges. Do not remove adhesive strips completely unless your health care provider tells you to do  that. °· Check your port insertion site every day for signs of infection. Check for: °? More redness, swelling, or pain. °? More fluid or blood. °? Warmth. °? Pus or a bad smell. °General instructions °· Do not take baths, swim, or use a hot tub until your health care provider approves. °· Do not lift anything that is heavier than 10 lb (4.5 kg) for a week, or as told by your health care provider. °· Ask your health care provider when it is okay to: °? Return to work or school. °? Resume usual physical activities or sports. °· Do not drive for 24 hours if you were given a medicine to help you relax (sedative). °· Take over-the-counter and prescription medicines only as told by your health care provider. °· Wear a medical alert bracelet in case of an emergency. This will tell any health care providers that you have a port. °· Keep all follow-up visits as told by your health care provider. This is important. °Contact a health care provider if: °· You cannot flush your port with saline as directed, or you cannot draw blood from the port. °· You have a fever or chills. °· You have more redness, swelling, or pain around your port insertion site. °· You have more fluid or blood coming from your port insertion site. °· Your port insertion site feels warm to the touch. °· You have pus or a bad smell coming from the port insertion site. °Get help right away if: °· You have chest pain or shortness of breath. °· You have bleeding from your port that you cannot control. °Summary °· Take care of the port as told by your health care provider. °· Change your dressing as told by your health care provider. °· Keep all follow-up visits as told by your health care provider. °This information is not intended to replace advice given to you by your health care provider. Make sure you discuss any questions you have with your health care provider. °Document Released: 07/17/2013 Document Revised: 08/17/2016 Document Reviewed:  08/17/2016 °Elsevier Interactive Patient Education © 2017 Elsevier Inc. ° ° °

## 2017-04-07 NOTE — H&P (Signed)
Referring Physician(s): Mohamed,Mohamed  Supervising Physician: Corrie Mckusick  Patient Status:  [facility] OP  Chief Complaint:  "I'm having a port a cath put in"  Subjective: Patient familiar to IR service from prior left adrenal nodule biopsy on 02/08/17. 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 Recent adrenal mass biopsy revealed metastatic disease. She presents today for Port-A-Cath placement for additional chemotherapy. She currently denies fever, headache, chest pain, abdominal pain, nausea, vomiting. She does have chronic dyspnea, occasional cough, some recent hemoptysis, as well as back pain. Past Medical History:  Diagnosis Date  . 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 Bradycardia   . CAD (coronary artery disease)   . COPD (chronic obstructive pulmonary disease) ([place])   . Coronary atherosclerosis of native coronary artery 2011  . Encounter for antineoplastic chemotherapy 06/23/2016  . Fibrocystic breast   . History of radiation therapy 07/05/16 - 08/09/16    Left Lung: 45 Gy in 25 fractions  . HTN (hypertension)   . Hyperlipidemia   . Hypothyroidism   . Neutropenic fever ([place]) 03/15/2017  . Obesity   . Pain in joint   . Right renal mass 11/30/2016  . 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 Thrombocytopenia ([place]) 03/15/2017   Past Surgical History:  Procedure Laterality Date  . CHOLECYSTECTOMY    . CORONARY ANGIOPLASTY WITH STENT PLACEMENT  2011   [place] 30-40%, LAD 65-75% (FFR 0.88), CFX 55-60%, RCA 95%>0 w/ 2.5 x 12 mm monorail stent  . TUBAL LIGATION    . VIDEO BRONCHOSCOPY Bilateral 06/07/2016   Procedure: VIDEO BRONCHOSCOPY WITH FLUORO;  Surgeon: [name], MD;  Location: [facility] ENDOSCOPY;  Service: Cardiopulmonary;  Laterality: Bilateral;      Allergies: Ampicillin and Codeine  Medications: Prior to Admission medications   Medication  Sig Start Date End Date Taking? Authorizing Provider  amLODipine (NORVASC) 5 MG tablet Take 5 mg by mouth daily.   Yes [provider]  aspirin 81 MG tablet Take 81 mg by mouth daily.   Yes [provider]  atorvastatin (LIPITOR) 80 MG tablet Take 80 mg by mouth daily.   Yes [provider]  budesonide (PULMICORT) 0.25 MG/2ML nebulizer solution Take 0.25 mg by nebulization 2 (two) times daily.   Yes [provider]  feeding supplement (BOOST HIGH PROTEIN) LIQD Take 1 Container by mouth daily.   Yes [provider]  ipratropium-albuterol (DUONEB) 0.5-2.5 (3) MG/3ML SOLN Take 3 mLs by nebulization every 6 (six) hours as needed. 03/21/17  Yes Sheikh, Omair Latif, DO  levothyroxine (SYNTHROID, LEVOTHROID) 125 MCG tablet Take 125 mcg by mouth daily before breakfast.  02/26/15  Yes [provider]  magic mouthwash w/lidocaine SOLN Take 5 mLs by mouth 4 (four) times daily as needed for mouth pain. 03/21/17  Yes Sheikh, Omair Latif, DO  metoprolol succinate (TOPROL-XL) 50 MG 24 hr tablet Take 1 tablet (50 mg total) by mouth 2 (two) times daily. 01/06/16  Yes Belva Crome, MD  Multiple Vitamins-Minerals (MULTIVITAMIN WITH MINERALS) tablet Take 1 tablet by mouth daily.   Yes [provider]  omeprazole (PRILOSEC) 20 MG capsule Take 1 capsule (20 mg total) by mouth daily. 02/13/17  Yes [name], MD  OXYGEN Inhale 2 L into the lungs continuous.   Yes [provider]  sodium chloride (OCEAN) 0.65 % SOLN nasal spray Place 2 sprays into both nostrils  as needed for congestion. 03/21/17  Yes Sheikh, Omair Latif, DO  Vitamin D, Cholecalciferol, 1000 UNITS CAPS Take 1,000 Units by mouth daily.  08/25/14  Yes [provider]  benzonatate (TESSALON) 200 MG capsule Take 1 capsule (200 mg total) by mouth 3 (three) times daily as needed for cough. 03/21/17   Raiford Noble Latif, DO  guaiFENesin (MUCINEX) 600 MG 12 hr tablet Take 2 tablets  (1,200 mg total) by mouth 2 (two) times daily. 03/21/17   Raiford Noble Latif, DO     Vital Signs: BP (!) 150/57 (BP Location: Left Arm)   Pulse 69   Temp 98.4 F (36.9 C) (Oral)   Resp 20   Ht 5\' 4"  (1.626 m)   Wt 144 lb (65.3 kg)   SpO2 98%   BMI 24.72 kg/m   Physical Exam awake, alert. Chest with diminished breath sounds left upper/lower fields, scattered crackles noted throughout; heart with regular rate and rhythm. Abdomen soft, positive bowel sounds, nontender. No lower extremity edema.  Imaging: No results found.  Labs:  CBC:  Recent Labs  03/21/17 0601 03/29/17 1524 04/05/17 1352 04/07/17 1239  WBC 4.0 11.6* 9.2 11.3*  HGB 8.7* 7.9* 10.9* 11.3*  HCT 26.0* 23.7* 34.4* 35.1*  PLT 60* 255 419* 483*    COAGS:  Recent Labs  03/03/17 1547 03/04/17 0317 03/09/17 1458 04/07/17 1239  INR 2.39 2.07 1.0 1.03  APTT  --   --   --  27    BMP:  Recent Labs  03/19/17 0533 03/20/17 0524 03/21/17 0601 03/29/17 1524 04/05/17 1352 04/07/17 1239  NA 143 142 140 141 144 141  K 3.5 3.4* 3.9 3.5 4.4 4.5  CL 106 106 105  --   --  103  CO2 29 28 29 28  30* 28  GLUCOSE 98 95 89 200* 104 85  BUN 23* 23* 22* 25.1 25.5 36*  CALCIUM 8.7* 8.7* 8.6* 9.1 9.6 9.2  CREATININE 1.46* 1.42* 1.51* 1.9* 1.6* 1.58*  GFRNONAA 33* 35* 32*  --   --  30*  GFRAA 39* 40* 37*  --   --  35*    LIVER FUNCTION TESTS:  Recent Labs  03/20/17 0524 03/21/17 0601 03/29/17 1524 04/05/17 1352  BILITOT 0.5 0.7 0.51 0.43  AST 36 40 42* 27  ALT 28 28 32 24  ALKPHOS 64 70 82 85  PROT 6.1* 6.2* 6.9 7.4  ALBUMIN 2.5* 2.6* 2.4* 2.6*    Assessment and Plan: Pt with history of 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 Recent adrenal mass biopsy revealed metastatic disease. She presents today for Port-A-Cath placement for additional chemotherapy.Risks and benefits discussed with the patient/spouse including, but not limited  to bleeding, infection, pneumothorax, or fibrin sheath development and need for additional procedures. All of the patient's questions were answered, patient is agreeable to proceed. Consent signed and in chart.     Electronically Signed: D. Rowe Robert, PA-C 04/07/2017, 1:39 PM   I spent a total of  20 minutes at the the patient's bedside AND on the patient's hospital floor or unit, greater than 50% of which was counseling/coordinating care for Port-A-Cath placement

## 2017-04-13 ENCOUNTER — Emergency Department (HOSPITAL_COMMUNITY): Payer: PPO

## 2017-04-13 ENCOUNTER — Emergency Department (HOSPITAL_COMMUNITY)
Admission: EM | Admit: 2017-04-13 | Discharge: 2017-04-13 | Disposition: A | Discharge status: Home / Self Care | Payer: PPO | Attending: Emergency Medicine | Admitting: Emergency Medicine

## 2017-04-13 ENCOUNTER — Encounter (HOSPITAL_COMMUNITY): Payer: Self-pay | Admitting: Emergency Medicine

## 2017-04-13 DIAGNOSIS — M542 Cervicalgia: Secondary | ICD-10-CM | POA: Diagnosis present

## 2017-04-13 DIAGNOSIS — Z7951 Long term (current) use of inhaled steroids: Secondary | ICD-10-CM | POA: Diagnosis not present

## 2017-04-13 DIAGNOSIS — Z79899 Other long term (current) drug therapy: Secondary | ICD-10-CM | POA: Insufficient documentation

## 2017-04-13 DIAGNOSIS — J449 Chronic obstructive pulmonary disease, unspecified: Secondary | ICD-10-CM | POA: Diagnosis not present

## 2017-04-13 DIAGNOSIS — G8918 Other acute postprocedural pain: Secondary | ICD-10-CM | POA: Diagnosis not present

## 2017-04-13 DIAGNOSIS — I2510 Atherosclerotic heart disease of native coronary artery without angina pectoris: Secondary | ICD-10-CM | POA: Diagnosis not present

## 2017-04-13 DIAGNOSIS — I10 Essential (primary) hypertension: Secondary | ICD-10-CM | POA: Insufficient documentation

## 2017-04-13 DIAGNOSIS — Z7982 Long term (current) use of aspirin: Secondary | ICD-10-CM | POA: Insufficient documentation

## 2017-04-13 DIAGNOSIS — Z87891 Personal history of nicotine dependence: Secondary | ICD-10-CM | POA: Insufficient documentation

## 2017-04-13 DIAGNOSIS — Z85118 Personal history of other malignant neoplasm of bronchus and lung: Secondary | ICD-10-CM | POA: Insufficient documentation

## 2017-04-13 DIAGNOSIS — E039 Hypothyroidism, unspecified: Secondary | ICD-10-CM | POA: Insufficient documentation

## 2017-04-13 DIAGNOSIS — Z452 Encounter for adjustment and management of vascular access device: Secondary | ICD-10-CM | POA: Diagnosis not present

## 2017-04-13 MED ORDER — HYDROCODONE-ACETAMINOPHEN 5-325 MG PO TABS
1.0000 | ORAL_TABLET | Freq: Once | ORAL | Status: AC
  Administered 2017-04-13: 1 via ORAL
  Filled 2017-04-13: qty 1

## 2017-04-13 MED ORDER — HYDROCODONE-ACETAMINOPHEN 5-325 MG PO TABS: 2.0000 | ORAL_TABLET | ORAL | 0 refills | Status: DC | PRN

## 2017-04-13 MED ORDER — NAPROXEN 500 MG PO TABS: 500.0000 mg | ORAL_TABLET | Freq: Two times a day (BID) | ORAL | 0 refills | Status: DC

## 2017-04-13 NOTE — Discharge Instructions (Signed)
Naproxen, and or Vicodin for pain.

## 2017-04-13 NOTE — ED Triage Notes (Signed)
Pt with c/o right side neck pain with intermittent cramping, onset after having port placed Friday, initially pain was bothersome, pain much worse today, worse with ROM, swallowing, drinking. Hx arthritis of neck. No nuchal rigidity, fevers, chills, n/v.

## 2017-04-13 NOTE — ED Provider Notes (Signed)
[facility] DEPT Provider Note   CSN: [id number] Arrival date & time: 04/13/17  1121     History   Chief Complaint Chief Complaint  Patient presents with  . Neck Pain    After Port Placement    HPI Renee Vance is a 76 y.o. female. Chief complaint is right neck and chest pain after Port-A-Cath placement.  HPI: Patient presents 6 days status post placement of right jugular and subclavian Port-A-Cath by IR. His history of rheumatoid arthritis. She states she had her neck turned to the left for half of the procedure and had neck pain. This is bothering her since the procedure. It is also painful at the site where the catheter was placed.  She is undergoing treatment for breast, and adrenal malignancies  Past Medical History:  Diagnosis Date  . 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 Bradycardia   . CAD (coronary artery disease)   . COPD (chronic obstructive pulmonary disease) ([place])   . Coronary atherosclerosis of native coronary artery 2011  . Encounter for antineoplastic chemotherapy 06/23/2016  . Fibrocystic breast   . History of radiation therapy 07/05/16 - 08/09/16    Left Lung: 45 Gy in 25 fractions  . HTN (hypertension)   . Hyperlipidemia   . Hypothyroidism   . Neutropenic fever ([place]) 03/15/2017  . Obesity   . Pain in joint   . Right renal mass 11/30/2016  . 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 Thrombocytopenia ([place]) 03/15/2017    Patient Active Problem List   Diagnosis Date Noted  . Febrile neutropenia ([place]) 03/15/2017  . Acute renal failure superimposed on stage 4 chronic kidney disease ([place]) 03/15/2017  . Thrombocytopenia ([place]) 03/15/2017  . Hospital acquired PNA   . Chronic respiratory failure with hypoxia ([place]) 10/29/2016  . 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 Chronic anticoagulation 08/19/2015  . PAF (paroxysmal atrial  fibrillation) ([place]) 03/26/2015  . HTN (hypertension)   . Coronary atherosclerosis of native coronary artery   . Hypothyroidism   . COPD (chronic obstructive pulmonary disease) ([place])     Past Surgical History:  Procedure Laterality Date  . CHOLECYSTECTOMY    . CORONARY ANGIOPLASTY WITH STENT PLACEMENT  2011   [place] 30-40%, LAD 65-75% (FFR 0.88), CFX 55-60%, RCA 95%>0 w/ 2.5 x 12 mm monorail stent  . IR FLUORO GUIDE PORT INSERTION RIGHT  04/07/2017  . IR US GUIDE VASC ACCESS RIGHT  04/07/2017  . TUBAL LIGATION    . VIDEO BRONCHOSCOPY Bilateral 06/07/2016   Procedure: VIDEO BRONCHOSCOPY WITH FLUORO;  Surgeon: [name], MD;  Location: [facility];  Service: Cardiopulmonary;  Laterality: Bilateral;    OB History    No data available       Home Medications    Prior to Admission medications   Medication Sig Start Date End Date Taking? Authorizing Provider  amLODipine (NORVASC) 5 MG tablet Take 5 mg by mouth daily.   Yes [provider]  aspirin 81 MG tablet Take 81 mg by mouth daily.   Yes [provider]  atorvastatin (LIPITOR) 80 MG tablet Take 80 mg by mouth daily.   Yes [provider]  budesonide (PULMICORT) 0.25 MG/2ML nebulizer solution Take 0.25 mg by nebulization 2 (two) times daily.   Yes [provider]  feeding supplement (BOOST HIGH PROTEIN) LIQD Take 1 Container by mouth daily.   Yes [provider]  ipratropium-albuterol (DUONEB) 0.5-2.5 (3) MG/3ML SOLN Take 3 mLs by nebulization every 6 (six) hours as needed. 03/21/17  Yes Sheikh, Omair Latif, DO  levothyroxine (SYNTHROID, LEVOTHROID) 125 MCG tablet Take 125 mcg by mouth daily before breakfast.  02/26/15  Yes [provider]  metoprolol succinate (TOPROL-XL) 50 MG 24 hr tablet Take 1 tablet (50 mg total) by mouth 2 (two) times daily. 01/06/16  Yes Belva Crome, MD  Multiple Vitamins-Minerals (MULTIVITAMIN WITH MINERALS) tablet Take 1 tablet by mouth daily.   Yes  [provider]  omeprazole (PRILOSEC) 20 MG capsule Take 1 capsule (20 mg total) by mouth daily. 02/13/17  Yes [name], MD  Vitamin D, Cholecalciferol, 1000 UNITS CAPS Take 1,000 Units by mouth daily.  08/25/14  Yes [provider]  HYDROcodone-acetaminophen (NORCO/VICODIN) 5-325 MG tablet Take 2 tablets by mouth every 4 (four) hours as needed. 04/13/17   Tanna Furry, MD  naproxen (NAPROSYN) 500 MG tablet Take 1 tablet (500 mg total) by mouth 2 (two) times daily. 04/13/17   Tanna Furry, MD  OXYGEN Inhale 2 L into the lungs continuous.    [provider]    Family History Family History  Problem Relation Age of Onset  . Heart disease Mother     Social History Social History  Substance Use Topics  . Smoking status: Former Smoker    Packs/day: 0.50    Years: 60.00    Quit date: 04/10/2014  . Smokeless tobacco: Never Used     Comment: june 2015  . Alcohol use No     Allergies   Ampicillin and Codeine   Review of Systems Review of Systems  Constitutional: Negative for appetite change, chills, diaphoresis, fatigue and fever.  HENT: Negative for mouth sores, sore throat and trouble swallowing.   Eyes: Negative for visual disturbance.  Respiratory: Negative for cough, chest tightness, shortness of breath and wheezing.   Cardiovascular: Positive for chest pain.  Gastrointestinal: Negative for abdominal distention, abdominal pain, diarrhea, nausea and vomiting.  Endocrine: Negative for polydipsia, polyphagia and polyuria.  Genitourinary: Negative for dysuria, frequency and hematuria.  Musculoskeletal: Positive for neck pain. Negative for gait problem.  Skin: Negative for color change, pallor and rash.  Neurological: Negative for dizziness, syncope, light-headedness and headaches.  Hematological: Does not bruise/bleed easily.  Psychiatric/Behavioral: Negative for behavioral problems and confusion.     Physical Exam Updated Vital Signs BP (!) 145/64  (BP Location: Left Arm)   Pulse 71   Temp 98.2 F (36.8 C) (Oral)   Resp 16   SpO2 97%   Physical Exam  Constitutional: She is oriented to person, place, and time. She appears well-developed and well-nourished. No distress.  HENT:  Head: Normocephalic.  Eyes: Conjunctivae are normal. Pupils are equal, round, and reactive to light. No scleral icterus.  Neck: Normal range of motion. Neck supple. No thyromegaly present.  Cardiovascular: Normal rate and regular rhythm.  Exam reveals no gallop and no friction rub.   No murmur heard. Pulmonary/Chest: Effort normal and breath sounds normal. No respiratory distress. She has no wheezes. She has no rales.    Abdominal: Soft. Bowel sounds are normal. She exhibits no distension. There is no tenderness. There is no rebound.  Musculoskeletal: Normal range of motion.  Neurological: She is alert and oriented to person, place, and time.  Skin: Skin is warm and dry. No rash noted.  Psychiatric: She has a normal mood and affect. Her behavior is normal.     ED  Treatments / Results  Labs (all labs ordered are listed, but only abnormal results are displayed) Labs Reviewed - No data to display  EKG  EKG Interpretation None       Radiology Dg Neck Soft Tissue  Result Date: 04/13/2017 CLINICAL DATA:  History of lung cancer film the left, now with mid lower neck pain after placement of Port-A-Cath, smoking history EXAM: NECK SOFT TISSUES - 1+ VIEW COMPARISON:  Cervical spine films of 03/14/2017 FINDINGS: There is diffuse degenerative disc disease particularly from C3-C7 with loss of disc space and sclerosis with spurring. Slight anterolisthesis of C3 on C4 appears stable. The hypopharynx appears normal and the epiglottis appears normal. The lung apices are clear. IMPRESSION: 1. Straightened alignment with diffuse degenerative disc disease. 2. No acute abnormality is seen. Electronically Signed   By: Ivar Drape M.D.   On: 04/13/2017 12:33   Dg Chest  1 View  Result Date: 04/13/2017 CLINICAL DATA:  History of left lung carcinoma, mid to lower neck pain after placement of Port-A-Cath, smoking history EXAM: CHEST 1 VIEW COMPARISON:  Chest x-ray of 03/20/2017 and CT chest of 03/03/2017 FINDINGS: The abnormal opacity overlying the left hilum consistent with lung carcinoma is again noted with peripheral opacity consistent with pneumonitis. Linear scarring remains in the right mid lung. A right-sided Port-A-Cath is now present with the tip overlying the mid SVC. No pneumothorax is seen. There may be a small left pleural effusion present but no right effusion is noted. Heart size is stable. IMPRESSION: 1. Little change in left hilar mass and probable peripheral pneumonitis. 2. Right-sided Port-A-Cath tip overlies the mid SVC. No pneumothorax. 3. Question small left pleural effusion. Electronically Signed   By: Ivar Drape M.D.   On: 04/13/2017 12:42    Procedures Procedures (including critical care time)  Medications Ordered in ED Medications  HYDROcodone-acetaminophen (NORCO/VICODIN) 5-325 MG per tablet 1 tablet (1 tablet Oral Given 04/13/17 1240)     Initial Impression / Assessment and Plan / ED Course  I have reviewed the triage vital signs and the nursing notes.  Pertinent labs & imaging results that were available during my care of the patient were reviewed by me and considered in my medical decision making (see chart for details).    Likely discomfort from positioning from the procedure. X-rays of the neck soft tissue, and chest were obtained. No sign of pneumothorax soft tissue swelling or other complications noted. Lexapro. For simple pain management with limited number of anti-inflammatory and pain medications via prescription. Follow up with her physician as needed.  Final Clinical Impressions(s) / ED Diagnoses   Final diagnoses:  Post procedure discomfort    New Prescriptions New Prescriptions   HYDROCODONE-ACETAMINOPHEN  (NORCO/VICODIN) 5-325 MG TABLET    Take 2 tablets by mouth every 4 (four) hours as needed.   NAPROXEN (NAPROSYN) 500 MG TABLET    Take 1 tablet (500 mg total) by mouth 2 (two) times daily.     Tanna Furry, MD 04/13/17 1400

## 2017-04-14 ENCOUNTER — Other Ambulatory Visit (HOSPITAL_BASED_OUTPATIENT_CLINIC_OR_DEPARTMENT_OTHER): Payer: PPO

## 2017-04-14 ENCOUNTER — Other Ambulatory Visit: Payer: Self-pay | Admitting: Emergency Medicine

## 2017-04-14 ENCOUNTER — Ambulatory Visit (HOSPITAL_BASED_OUTPATIENT_CLINIC_OR_DEPARTMENT_OTHER): Payer: PPO

## 2017-04-14 VITALS — BP 161/69 | HR 78 | Temp 98.1°F | Resp 18

## 2017-04-14 DIAGNOSIS — C7972 Secondary malignant neoplasm of left adrenal gland: Secondary | ICD-10-CM | POA: Diagnosis not present

## 2017-04-14 DIAGNOSIS — Z5111 Encounter for antineoplastic chemotherapy: Secondary | ICD-10-CM | POA: Diagnosis not present

## 2017-04-14 DIAGNOSIS — C3492 Malignant neoplasm of unspecified part of left bronchus or lung: Secondary | ICD-10-CM | POA: Diagnosis not present

## 2017-04-14 LAB — CBC WITH DIFFERENTIAL/PLATELET
BASO%: 0.6 % (ref 0.0–2.0)
BASOS ABS: 0 10*3/uL (ref 0.0–0.1)
EOS ABS: 0.2 10*3/uL (ref 0.0–0.5)
EOS%: 3.5 % (ref 0.0–7.0)
HCT: 30.9 % — ABNORMAL LOW (ref 34.8–46.6)
HGB: 10.3 g/dL — ABNORMAL LOW (ref 11.6–15.9)
LYMPH%: 6.7 % — AB (ref 14.0–49.7)
MCH: 28.4 pg (ref 25.1–34.0)
MCHC: 33.2 g/dL (ref 31.5–36.0)
MCV: 85.5 fL (ref 79.5–101.0)
MONO#: 0.6 10*3/uL (ref 0.1–0.9)
MONO%: 10.8 % (ref 0.0–14.0)
NEUT#: 4.5 10*3/uL (ref 1.5–6.5)
NEUT%: 78.4 % — ABNORMAL HIGH (ref 38.4–76.8)
Platelets: 176 10*3/uL (ref 145–400)
RBC: 3.61 10*6/uL — AB (ref 3.70–5.45)
RDW: 14.6 % — ABNORMAL HIGH (ref 11.2–14.5)
WBC: 5.8 10*3/uL (ref 3.9–10.3)
lymph#: 0.4 10*3/uL — ABNORMAL LOW (ref 0.9–3.3)

## 2017-04-14 LAB — COMPREHENSIVE METABOLIC PANEL
ALK PHOS: 89 U/L (ref 40–150)
ALT: 28 U/L (ref 0–55)
AST: 31 U/L (ref 5–34)
Albumin: 2.8 g/dL — ABNORMAL LOW (ref 3.5–5.0)
Anion Gap: 9 mEq/L (ref 3–11)
BUN: 40.8 mg/dL — ABNORMAL HIGH (ref 7.0–26.0)
CHLORIDE: 101 meq/L (ref 98–109)
CO2: 31 meq/L — AB (ref 22–29)
Calcium: 10.3 mg/dL (ref 8.4–10.4)
Creatinine: 1.8 mg/dL — ABNORMAL HIGH (ref 0.6–1.1)
EGFR: 26 mL/min/{1.73_m2} — AB (ref >90)
GLUCOSE: 127 mg/dL (ref 70–140)
POTASSIUM: 4.8 meq/L (ref 3.5–5.1)
SODIUM: 141 meq/L (ref 136–145)
Total Bilirubin: 0.51 mg/dL (ref 0.20–1.20)
Total Protein: 7.6 g/dL (ref 6.4–8.3)

## 2017-04-14 MED ORDER — SODIUM CHLORIDE 0.9 % IV SOLN
800.0000 mg/m2 | Freq: Once | INTRAVENOUS | Status: AC
  Administered 2017-04-14: 1368 mg via INTRAVENOUS
  Filled 2017-04-14: qty 35.98

## 2017-04-14 MED ORDER — SODIUM CHLORIDE 0.9 % IV SOLN
Freq: Once | INTRAVENOUS | Status: AC
  Administered 2017-04-14: 14:00:00 via INTRAVENOUS

## 2017-04-14 MED ORDER — SODIUM CHLORIDE 0.9% FLUSH
10.0000 mL | INTRAVENOUS | Status: DC | PRN
  Administered 2017-04-14: 10 mL
  Filled 2017-04-14: qty 10

## 2017-04-14 MED ORDER — LIDOCAINE-PRILOCAINE 2.5-2.5 % EX CREA: 1.0000 "application " | TOPICAL_CREAM | CUTANEOUS | 0 refills | Status: DC | PRN

## 2017-04-14 MED ORDER — PROCHLORPERAZINE MALEATE 10 MG PO TABS
ORAL_TABLET | ORAL | Status: AC
  Filled 2017-04-14: qty 1

## 2017-04-14 MED ORDER — HEPARIN SOD (PORK) LOCK FLUSH 100 UNIT/ML IV SOLN
500.0000 [IU] | Freq: Once | INTRAVENOUS | Status: AC | PRN
  Administered 2017-04-14: 500 [IU]
  Filled 2017-04-14: qty 5

## 2017-04-14 MED ORDER — PROCHLORPERAZINE MALEATE 10 MG PO TABS
10.0000 mg | ORAL_TABLET | Freq: Once | ORAL | Status: AC
  Administered 2017-04-14: 10 mg via ORAL

## 2017-04-14 NOTE — Progress Notes (Signed)
Pt asked if it would be ok to get cataract sx during tx, pt will be asking Dr Julien Nordmann during the next visit, 04/26/2017.   Per Dr Julien Nordmann, [facility] to tx with creatinine of 1.8 today.

## 2017-04-14 NOTE — Patient Instructions (Signed)
[facility] Discharge Instructions for Patients Receiving Chemotherapy  Today you received the following chemotherapy agents Gemzar  To help prevent nausea and vomiting after your treatment, we encourage you to take your nausea medication as prescribed   If you develop nausea and vomiting that is not controlled by your nausea medication, call the clinic.   BELOW ARE SYMPTOMS THAT SHOULD BE REPORTED IMMEDIATELY:  *FEVER GREATER THAN 100.5 F  *CHILLS WITH OR WITHOUT FEVER  NAUSEA AND VOMITING THAT IS NOT CONTROLLED WITH YOUR NAUSEA MEDICATION  *UNUSUAL SHORTNESS OF BREATH  *UNUSUAL BRUISING OR BLEEDING  TENDERNESS IN MOUTH AND THROAT WITH OR WITHOUT PRESENCE OF ULCERS  *URINARY PROBLEMS  *BOWEL PROBLEMS  UNUSUAL RASH Items with * indicate a potential emergency and should be followed up as soon as possible.  Feel free to call the clinic you have any questions or concerns. The clinic phone number is [phone].  Please show the CHEMO ALERT CARD at check-in to the Emergency Department and triage nurse.   

## 2017-04-17 ENCOUNTER — Ambulatory Visit: Payer: PPO | Admitting: Adult Health

## 2017-04-19 ENCOUNTER — Telehealth: Payer: Self-pay | Admitting: Adult Health

## 2017-04-19 ENCOUNTER — Other Ambulatory Visit: Payer: Self-pay | Admitting: Adult Health

## 2017-04-19 DIAGNOSIS — J189 Pneumonia, unspecified organism: Secondary | ICD-10-CM

## 2017-04-19 DIAGNOSIS — Y95 Nosocomial condition: Principal | ICD-10-CM

## 2017-04-19 NOTE — Telephone Encounter (Signed)
Spoke with patient and asked that she arrive for her 230pm on 04/20/17 with TP at 2pm to have chest xray performed prior OV. Pt verbalized understanding and did not have any questions. Order for xray to be placed. Nothing further is needed.

## 2017-04-20 ENCOUNTER — Ambulatory Visit (INDEPENDENT_AMBULATORY_CARE_PROVIDER_SITE_OTHER)
Admission: RE | Admit: 2017-04-20 | Discharge: 2017-04-20 | Disposition: A | Discharge status: Home / Self Care | Payer: PPO | Source: Ambulatory Visit | Attending: Adult Health | Admitting: Adult Health

## 2017-04-20 ENCOUNTER — Encounter: Payer: Self-pay | Admitting: Adult Health

## 2017-04-20 ENCOUNTER — Ambulatory Visit (INDEPENDENT_AMBULATORY_CARE_PROVIDER_SITE_OTHER): Payer: PPO | Admitting: Adult Health

## 2017-04-20 DIAGNOSIS — J9611 Chronic respiratory failure with hypoxia: Secondary | ICD-10-CM

## 2017-04-20 DIAGNOSIS — Z85118 Personal history of other malignant neoplasm of bronchus and lung: Secondary | ICD-10-CM | POA: Diagnosis not present

## 2017-04-20 DIAGNOSIS — J189 Pneumonia, unspecified organism: Secondary | ICD-10-CM

## 2017-04-20 DIAGNOSIS — Y95 Nosocomial condition: Secondary | ICD-10-CM

## 2017-04-20 DIAGNOSIS — J449 Chronic obstructive pulmonary disease, unspecified: Secondary | ICD-10-CM

## 2017-04-20 DIAGNOSIS — C3492 Malignant neoplasm of unspecified part of left bronchus or lung: Secondary | ICD-10-CM

## 2017-04-20 NOTE — Assessment & Plan Note (Signed)
Cont on O2 .  

## 2017-04-20 NOTE — Assessment & Plan Note (Signed)
Compensated wihtout flare   Plan  Patient Instructions  Mucinex DM Twice daily  As needed  Cough/congestion .  Continue on Duoneb .Twice daily  .  Continue on Pulmicort Twice daily  .  Follow up Dr. Elsworth Soho  In 2 months  And As needed  .  Please contact office for sooner follow up if symptoms do not improve or worsen or seek emergency care

## 2017-04-20 NOTE — Patient Instructions (Signed)
Mucinex DM Twice daily  As needed  Cough/congestion .  Continue on Duoneb .Twice daily  .  Continue on Pulmicort Twice daily  .  Follow up Dr. Elsworth Soho  In 2 months  And As needed  .  Please contact office for sooner follow up if symptoms do not improve or worsen or seek emergency care

## 2017-04-20 NOTE — Progress Notes (Signed)
@Patient  ID: Renee Vance, female    DOB: 06-Nov-1940, 76 y.o.   MRN: [id number]  Chief Complaint  Patient presents with  . Follow-up    Referring provider: [name]  HPI: 76 yo former smoker followed for COPD , Chronic hypoxic resp failure and  Metastatic non-small cell lung cancer initially diagnosed as Stage IIB (T2a, N1, M0) non-small cell lung cancer, squamous cell carcinoma presented with left hilar mass diagnosed in August 2017. Undergoing Concurrent chemoradiation.     TEST  Spirometry 04/2011 showed ratio 62 with FEV1 of 56%, FVC of 69% and DLCO 73% and corrected for alveolar volume.She was told that she has moderate COPD and was placed on Spiriva with which she is compliant  CT chest 01/2017 -moderate left effusion  04/20/2017 Follow up : COPD, HCAP, O2 RF  Pt returns for 1 month follow up for pneumonia . Admitted in June with severe pancytopenia and PNA . She was tx w/ abx. She is feeling is better with decreased cough and congestion . Still gets winded with walking . No fever or discolored mucus .  She was dx with lung cancer 05/2016 . tx w/ concurrent chemo and radiation. She has upcoming chemo next week.   Has COPD ., Remains on duoneb/pulmicort neb Twice daily  . (unable to afford inhaler )  No flare of cough or wheezing   Remains on O2 at 2l/m . Working well.      Allergies  Allergen Reactions  . Ampicillin Nausea And Vomiting    Has patient had a PCN reaction causing immediate rash, facial/tongue/throat swelling, SOB or lightheadedness with hypotension: no Has patient had a PCN reaction causing severe rash involving mucus membranes or skin necrosis: no Has patient had a PCN reaction that required hospitalization: no Has patient had a PCN reaction occurring within the last 10 years: no If all of the above answers are "NO", then may proceed with Cephalosporin use  . Codeine Nausea And Vomiting     There is no immunization history on file  for this patient.  Past Medical History:  Diagnosis Date  . 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 Bradycardia   . CAD (coronary artery disease)   . COPD (chronic obstructive pulmonary disease) ([place])   . Coronary atherosclerosis of native coronary artery 2011  . Encounter for antineoplastic chemotherapy 06/23/2016  . Fibrocystic breast   . History of radiation therapy 07/05/16 - 08/09/16    Left Lung: 45 Gy in 25 fractions  . HTN (hypertension)   . Hyperlipidemia   . Hypothyroidism   . Neutropenic fever ([place]) 03/15/2017  . Obesity   . Pain in joint   . Right renal mass 11/30/2016  . 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 Thrombocytopenia ([place]) 03/15/2017    Tobacco History: History  Smoking Status  . Former Smoker  . Packs/day: 0.50  . Years: 60.00  . Quit date: 04/10/2014  Smokeless Tobacco  . Never Used    Comment: june 2015   Counseling given: Not Answered   Outpatient Encounter Prescriptions as of 04/20/2017  Medication Sig  . amLODipine (NORVASC) 5 MG tablet Take 5 mg by mouth daily.  Marland Kitchen aspirin 81 MG tablet Take 81 mg by mouth daily.  Marland Kitchen atorvastatin (LIPITOR) 80 MG tablet Take 80 mg by mouth daily.  . budesonide (PULMICORT) 0.5 MG/2ML nebulizer solution Take 0.5 mg by nebulization 2 (two) times daily.   . feeding supplement (BOOST HIGH  PROTEIN) LIQD Take 1 Container by mouth daily.  Marland Kitchen ipratropium-albuterol (DUONEB) 0.5-2.5 (3) MG/3ML SOLN Take 3 mLs by nebulization every 6 (six) hours as needed.  Marland Kitchen levothyroxine (SYNTHROID, LEVOTHROID) 125 MCG tablet Take 125 mcg by mouth daily before breakfast.   . lidocaine-prilocaine (EMLA) cream Apply 1 application topically as needed.  . metoprolol succinate (TOPROL-XL) 50 MG 24 hr tablet Take 1 tablet (50 mg total) by mouth 2 (two) times daily.  . Multiple Vitamins-Minerals (MULTIVITAMIN WITH MINERALS) tablet Take 1 tablet by mouth daily.  Marland Kitchen omeprazole (PRILOSEC) 20 MG capsule Take 1  capsule (20 mg total) by mouth daily.  . OXYGEN Inhale 2 L into the lungs continuous.  . Vitamin D, Cholecalciferol, 1000 UNITS CAPS Take 1,000 Units by mouth daily.   . [DISCONTINUED] HYDROcodone-acetaminophen (NORCO/VICODIN) 5-325 MG tablet Take 2 tablets by mouth every 4 (four) hours as needed. (Patient not taking: Reported on 04/20/2017)  . [DISCONTINUED] naproxen (NAPROSYN) 500 MG tablet Take 1 tablet (500 mg total) by mouth 2 (two) times daily. (Patient not taking: Reported on 04/20/2017)   No facility-administered encounter medications on file as of 04/20/2017.      Review of Systems  Constitutional:   No  weight loss, night sweats,  Fevers, chills, + fatigue, or  lassitude.  HEENT:   No headaches,  Difficulty swallowing,  Tooth/dental problems, or  Sore throat,                No sneezing, itching, ear ache,  +nasal congestion, post nasal drip,   CV:  No chest pain,  Orthopnea, PND, swelling in lower extremities, anasarca, dizziness, palpitations, syncope.   GI  No heartburn, indigestion, abdominal pain, nausea, vomiting, diarrhea, change in bowel habits, loss of appetite, bloody stools.   Resp:   No chest wall deformity  Skin: no rash or lesions.  GU: no dysuria, change in color of urine, no urgency or frequency.  No flank pain, no hematuria   MS:  No joint pain or swelling.  No decreased range of motion.  No back pain.    Physical Exam  BP 132/66 (BP Location: Left Arm, Cuff Size: Normal)   Pulse 73   Ht 5\' 4"  (1.626 m)   Wt 144 lb 3.2 oz (65.4 kg)   SpO2 97%   BMI 24.75 kg/m   GEN: A/Ox3; pleasant , NAD, elderly    HEENT:  [place]/AT,  EACs-clear, TMs-wnl, NOSE-clear drainage , THROAT-clear, no lesions, no postnasal drip or exudate noted.   NECK:  Supple w/ fair ROM; no JVD; normal carotid impulses w/o bruits; no thyromegaly or nodules palpated; no lymphadenopathy.    RESP  Clear  P & A; w/o, wheezes/ rales/ or rhonchi. no accessory muscle use, no dullness to  percussion  CARD:  RRR, no m/r/g, no peripheral edema, pulses intact, no cyanosis or clubbing.  GI:   Soft & nt; nml bowel sounds; no organomegaly or masses detected.   Musco: Warm bil, no deformities or joint swelling noted.   Neuro: alert, no focal deficits noted.    Skin: Warm, no lesions or rashes    Lab Results:  CBC  BMET   BNP ProBNP No results found for: PROBNP  Imaging: Dg Neck Soft Tissue  Result Date: 04/13/2017 CLINICAL DATA:  History of lung cancer film the left, now with mid lower neck pain after placement of Port-A-Cath, smoking history EXAM: NECK SOFT TISSUES - 1+ VIEW COMPARISON:  Cervical spine films of 03/14/2017 FINDINGS: There is diffuse  degenerative disc disease particularly from C3-C7 with loss of disc space and sclerosis with spurring. Slight anterolisthesis of C3 on C4 appears stable. The hypopharynx appears normal and the epiglottis appears normal. The lung apices are clear. IMPRESSION: 1. Straightened alignment with diffuse degenerative disc disease. 2. No acute abnormality is seen. Electronically Signed   By: Ivar Drape M.D.   On: 04/13/2017 12:33   Dg Chest 1 View  Result Date: 04/13/2017 CLINICAL DATA:  History of left lung carcinoma, mid to lower neck pain after placement of Port-A-Cath, smoking history EXAM: CHEST 1 VIEW COMPARISON:  Chest x-ray of 03/20/2017 and CT chest of 03/03/2017 FINDINGS: The abnormal opacity overlying the left hilum consistent with lung carcinoma is again noted with peripheral opacity consistent with pneumonitis. Linear scarring remains in the right mid lung. A right-sided Port-A-Cath is now present with the tip overlying the mid SVC. No pneumothorax is seen. There may be a small left pleural effusion present but no right effusion is noted. Heart size is stable. IMPRESSION: 1. Little change in left hilar mass and probable peripheral pneumonitis. 2. Right-sided Port-A-Cath tip overlies the mid SVC. No pneumothorax. 3. Question  small left pleural effusion. Electronically Signed   By: Ivar Drape M.D.   On: 04/13/2017 12:42   Ir US Guide Vasc Access Right  Result Date: 04/07/2017 INDICATION: 76 year old female with a history of squamous cell carcinoma left lung EXAM: IMPLANTED PORT A CATH PLACEMENT WITH ULTRASOUND AND FLUOROSCOPIC GUIDANCE MEDICATIONS: Clindamycin 600 mg IV; The antibiotic was administered within an appropriate time interval prior to skin puncture. ANESTHESIA/SEDATION: Moderate (conscious) sedation was employed during this procedure. A total of Versed 2.0 mg and Fentanyl 100 mcg was administered intravenously. Moderate Sedation Time: 20 minutes. The patient's level of consciousness and vital signs were monitored continuously by radiology nursing throughout the procedure under my direct supervision. FLUOROSCOPY TIME:  Zero minutes, 48 seconds (6.7 mGy) COMPLICATIONS: None PROCEDURE: The procedure, risks, benefits, and alternatives were explained to the patient. Questions regarding the procedure were encouraged and answered. The patient understands and consents to the procedure. Ultrasound survey was performed with images stored and sent to PACs. The right neck and chest was prepped with chlorhexidine, and draped in the usual sterile fashion using maximum barrier technique (cap and mask, sterile gown, sterile gloves, large sterile sheet, hand hygiene and cutaneous antiseptic). Antibiotic prophylaxis was provided with clindamycin administered IV one hour prior to skin incision. Local anesthesia was attained by infiltration with 1% lidocaine without epinephrine. Ultrasound demonstrated patency of the right internal jugular vein, and this was documented with an image. Under real-time ultrasound guidance, this vein was accessed with a 21 gauge micropuncture needle and image documentation was performed. A small dermatotomy was made at the access site with an 11 scalpel. A 0.018" wire was advanced into the SVC and used to  estimate the length of the internal catheter. The access needle exchanged for a 66F micropuncture vascular sheath. The 0.018" wire was then removed and a 0.035" wire advanced into the IVC. An appropriate location for the subcutaneous reservoir was selected below the clavicle and an incision was made through the skin and underlying soft tissues. The subcutaneous tissues were then dissected using a combination of blunt and sharp surgical technique and a pocket was formed. A single lumen power injectable portacatheter was then tunneled through the subcutaneous tissues from the pocket to the dermatotomy and the port reservoir placed within the subcutaneous pocket. The venous access site was then serially dilated  and a peel away vascular sheath placed over the wire. The wire was removed and the port catheter advanced into position under fluoroscopic guidance. The catheter tip is positioned in the cavoatrial junction. This was documented with a spot image. The portacatheter was then tested and found to flush and aspirate well. The port was flushed with saline followed by 100 units/mL heparinized saline. The pocket was then closed in two layers using first subdermal inverted interrupted absorbable sutures followed by a running subcuticular suture. The epidermis was then sealed with Dermabond. The dermatotomy at the venous access site was also seal with Dermabond. Patient tolerated the procedure well and remained hemodynamically stable throughout. No complications encountered and no significant blood loss encountered IMPRESSION: Status post right IJ port catheter.  Catheter ready for use. Signed, Dulcy Fanny. Earleen Newport, DO [facility] Radiology Electronically Signed   By: Corrie Mckusick D.O.   On: 04/07/2017 16:27   Ir Fluoro Guide Port Insertion Right  Result Date: 04/07/2017 INDICATION: 76 year old female with a history of squamous cell carcinoma left lung EXAM: IMPLANTED PORT A  CATH PLACEMENT WITH ULTRASOUND AND FLUOROSCOPIC GUIDANCE MEDICATIONS: Clindamycin 600 mg IV; The antibiotic was administered within an appropriate time interval prior to skin puncture. ANESTHESIA/SEDATION: Moderate (conscious) sedation was employed during this procedure. A total of Versed 2.0 mg and Fentanyl 100 mcg was administered intravenously. Moderate Sedation Time: 20 minutes. The patient's level of consciousness and vital signs were monitored continuously by radiology nursing throughout the procedure under my direct supervision. FLUOROSCOPY TIME:  Zero minutes, 48 seconds (6.7 mGy) COMPLICATIONS: None PROCEDURE: The procedure, risks, benefits, and alternatives were explained to the patient. Questions regarding the procedure were encouraged and answered. The patient understands and consents to the procedure. Ultrasound survey was performed with images stored and sent to PACs. The right neck and chest was prepped with chlorhexidine, and draped in the usual sterile fashion using maximum barrier technique (cap and mask, sterile gown, sterile gloves, large sterile sheet, hand hygiene and cutaneous antiseptic). Antibiotic prophylaxis was provided with clindamycin administered IV one hour prior to skin incision. Local anesthesia was attained by infiltration with 1% lidocaine without epinephrine. Ultrasound demonstrated patency of the right internal jugular vein, and this was documented with an image. Under real-time ultrasound guidance, this vein was accessed with a 21 gauge micropuncture needle and image documentation was performed. A small dermatotomy was made at the access site with an 11 scalpel. A 0.018" wire was advanced into the SVC and used to estimate the length of the internal catheter. The access needle exchanged for a 27F micropuncture vascular sheath. The 0.018" wire was then removed and a 0.035" wire advanced into the IVC. An appropriate location for the subcutaneous reservoir was selected below the  clavicle and an incision was made through the skin and underlying soft tissues. The subcutaneous tissues were then dissected using a combination of blunt and sharp surgical technique and a pocket was formed. A single lumen power injectable portacatheter was then tunneled through the subcutaneous tissues from the pocket to the dermatotomy and the port reservoir placed within the subcutaneous pocket. The venous access site was then serially dilated and a peel away vascular sheath placed over the wire. The wire was removed and the port catheter advanced into position under fluoroscopic guidance. The catheter tip is positioned in the cavoatrial junction. This was documented with a spot image. The portacatheter was then tested and found to flush and aspirate well. The port was flushed  with saline followed by 100 units/mL heparinized saline. The pocket was then closed in two layers using first subdermal inverted interrupted absorbable sutures followed by a running subcuticular suture. The epidermis was then sealed with Dermabond. The dermatotomy at the venous access site was also seal with Dermabond. Patient tolerated the procedure well and remained hemodynamically stable throughout. No complications encountered and no significant blood loss encountered IMPRESSION: Status post right IJ port catheter.  Catheter ready for use. Signed, Dulcy Fanny. Earleen Newport, DO [facility] Radiology Electronically Signed   By: Corrie Mckusick D.O.   On: 04/07/2017 16:27     Assessment & Plan:   COPD (chronic obstructive pulmonary disease) ([place]) Compensated wihtout flare   Plan  Patient Instructions  Mucinex DM Twice daily  As needed  Cough/congestion .  Continue on Duoneb .Twice daily  .  Continue on Pulmicort Twice daily  .  Follow up Dr. Elsworth Soho  In 2 months  And As needed  .  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      Rexene Edison, NP 04/20/2017

## 2017-04-20 NOTE — Assessment & Plan Note (Signed)
Follow up with oncology next week as planned

## 2017-04-20 NOTE — Assessment & Plan Note (Signed)
Left sided PNA now completed abx  Clinically improve d Check cxr today .

## 2017-04-21 DIAGNOSIS — C3410 Malignant neoplasm of upper lobe, unspecified bronchus or lung: Secondary | ICD-10-CM | POA: Diagnosis not present

## 2017-04-21 DIAGNOSIS — J449 Chronic obstructive pulmonary disease, unspecified: Secondary | ICD-10-CM | POA: Diagnosis not present

## 2017-04-21 NOTE — Progress Notes (Signed)
She seems to have interstitial lung disease that has progressed over the last 6 months Unclear etiology- ? Drug-induced, doubt IPF since rapid progression, lymphangitic cancer remains possible

## 2017-04-26 ENCOUNTER — Ambulatory Visit (HOSPITAL_BASED_OUTPATIENT_CLINIC_OR_DEPARTMENT_OTHER): Payer: PPO | Admitting: Internal Medicine

## 2017-04-26 ENCOUNTER — Telehealth: Payer: Self-pay

## 2017-04-26 ENCOUNTER — Encounter: Payer: Self-pay | Admitting: Internal Medicine

## 2017-04-26 ENCOUNTER — Other Ambulatory Visit (HOSPITAL_BASED_OUTPATIENT_CLINIC_OR_DEPARTMENT_OTHER): Payer: PPO

## 2017-04-26 ENCOUNTER — Ambulatory Visit (HOSPITAL_BASED_OUTPATIENT_CLINIC_OR_DEPARTMENT_OTHER): Payer: PPO

## 2017-04-26 ENCOUNTER — Ambulatory Visit (HOSPITAL_COMMUNITY)
Admission: RE | Admit: 2017-04-26 | Discharge: 2017-04-26 | Disposition: A | Discharge status: Home / Self Care | Payer: PPO | Source: Ambulatory Visit | Attending: Internal Medicine | Admitting: Internal Medicine

## 2017-04-26 ENCOUNTER — Ambulatory Visit: Payer: PPO

## 2017-04-26 VITALS — BP 139/57 | HR 78 | Temp 98.5°F | Resp 18 | Ht 64.0 in | Wt 145.4 lb

## 2017-04-26 DIAGNOSIS — D6481 Anemia due to antineoplastic chemotherapy: Secondary | ICD-10-CM | POA: Diagnosis not present

## 2017-04-26 DIAGNOSIS — Z5111 Encounter for antineoplastic chemotherapy: Secondary | ICD-10-CM | POA: Insufficient documentation

## 2017-04-26 DIAGNOSIS — R05 Cough: Secondary | ICD-10-CM

## 2017-04-26 DIAGNOSIS — C3492 Malignant neoplasm of unspecified part of left bronchus or lung: Secondary | ICD-10-CM

## 2017-04-26 DIAGNOSIS — R0609 Other forms of dyspnea: Secondary | ICD-10-CM | POA: Diagnosis not present

## 2017-04-26 DIAGNOSIS — C7972 Secondary malignant neoplasm of left adrenal gland: Secondary | ICD-10-CM

## 2017-04-26 DIAGNOSIS — D61810 Antineoplastic chemotherapy induced pancytopenia: Secondary | ICD-10-CM | POA: Diagnosis not present

## 2017-04-26 DIAGNOSIS — Z9981 Dependence on supplemental oxygen: Secondary | ICD-10-CM

## 2017-04-26 DIAGNOSIS — D649 Anemia, unspecified: Secondary | ICD-10-CM

## 2017-04-26 DIAGNOSIS — J9611 Chronic respiratory failure with hypoxia: Secondary | ICD-10-CM

## 2017-04-26 DIAGNOSIS — D696 Thrombocytopenia, unspecified: Secondary | ICD-10-CM

## 2017-04-26 DIAGNOSIS — T451X5A Adverse effect of antineoplastic and immunosuppressive drugs, initial encounter: Secondary | ICD-10-CM

## 2017-04-26 DIAGNOSIS — R5383 Other fatigue: Secondary | ICD-10-CM | POA: Diagnosis not present

## 2017-04-26 LAB — CBC WITH DIFFERENTIAL/PLATELET
BASO%: 0.2 % (ref 0.0–2.0)
BASOS ABS: 0 10*3/uL (ref 0.0–0.1)
EOS ABS: 0.1 10*3/uL (ref 0.0–0.5)
EOS%: 1.2 % (ref 0.0–7.0)
HCT: 24.9 % — ABNORMAL LOW (ref 34.8–46.6)
HGB: 8.1 g/dL — ABNORMAL LOW (ref 11.6–15.9)
LYMPH%: 6.8 % — AB (ref 14.0–49.7)
MCH: 28.6 pg (ref 25.1–34.0)
MCHC: 32.5 g/dL (ref 31.5–36.0)
MCV: 88 fL (ref 79.5–101.0)
MONO#: 1.2 10*3/uL — AB (ref 0.1–0.9)
MONO%: 19.1 % — AB (ref 0.0–14.0)
NEUT#: 4.4 10*3/uL (ref 1.5–6.5)
NEUT%: 72.7 % (ref 38.4–76.8)
PLATELETS: 45 10*3/uL — AB (ref 145–400)
RBC: 2.83 10*6/uL — AB (ref 3.70–5.45)
RDW: 14.7 % — ABNORMAL HIGH (ref 11.2–14.5)
WBC: 6 10*3/uL (ref 3.9–10.3)
lymph#: 0.4 10*3/uL — ABNORMAL LOW (ref 0.9–3.3)

## 2017-04-26 LAB — COMPREHENSIVE METABOLIC PANEL
ALBUMIN: 2.8 g/dL — AB (ref 3.5–5.0)
ALT: 51 U/L (ref 0–55)
ANION GAP: 10 meq/L (ref 3–11)
AST: 46 U/L — ABNORMAL HIGH (ref 5–34)
Alkaline Phosphatase: 84 U/L (ref 40–150)
BILIRUBIN TOTAL: 0.7 mg/dL (ref 0.20–1.20)
BUN: 32.1 mg/dL — ABNORMAL HIGH (ref 7.0–26.0)
CO2: 29 meq/L (ref 22–29)
Calcium: 9.5 mg/dL (ref 8.4–10.4)
Chloride: 103 mEq/L (ref 98–109)
Creatinine: 1.8 mg/dL — ABNORMAL HIGH (ref 0.6–1.1)
EGFR: 26 mL/min/{1.73_m2} — AB (ref >90)
GLUCOSE: 101 mg/dL (ref 70–140)
POTASSIUM: 4.2 meq/L (ref 3.5–5.1)
Sodium: 142 mEq/L (ref 136–145)
Total Protein: 7.1 g/dL (ref 6.4–8.3)

## 2017-04-26 LAB — PREPARE RBC (CROSSMATCH)

## 2017-04-26 MED ORDER — DIPHENHYDRAMINE HCL 25 MG PO CAPS
ORAL_CAPSULE | ORAL | Status: AC
  Filled 2017-04-26: qty 1

## 2017-04-26 MED ORDER — DIPHENHYDRAMINE HCL 25 MG PO CAPS
25.0000 mg | ORAL_CAPSULE | Freq: Once | ORAL | Status: AC
  Administered 2017-04-26: 25 mg via ORAL

## 2017-04-26 MED ORDER — SODIUM CHLORIDE 0.9 % IV SOLN
250.0000 mL | Freq: Once | INTRAVENOUS | Status: AC
  Administered 2017-04-26: 250 mL via INTRAVENOUS

## 2017-04-26 MED ORDER — ACETAMINOPHEN 325 MG PO TABS
650.0000 mg | ORAL_TABLET | Freq: Once | ORAL | Status: AC
  Administered 2017-04-26: 650 mg via ORAL

## 2017-04-26 MED ORDER — ACETAMINOPHEN 325 MG PO TABS
ORAL_TABLET | ORAL | Status: AC
  Filled 2017-04-26: qty 2

## 2017-04-26 NOTE — Telephone Encounter (Signed)
Blood added to 7/19 per 7/18 inbasket message  Renee Vance

## 2017-04-26 NOTE — Progress Notes (Signed)
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  PRIOR THERAPY: Concurrent chemoradiation with weekly carboplatin for AUC of 2 and paclitaxel 45 MG/M2 started 07/05/2016, status post 5 cycles. Last cycle was given 08/01/2016.  CURRENT THERAPY: Systemic chemotherapy with carboplatin for AUC of 5 on day 1 and gemcitabine 1000 MG/M2 on days 1 and 8 every 3 weeks. Status post 2 cycles. Starting from cycle #2 carboplatin will be reduced to AUC of 3 on day 1 and gemcitabine 800 MG/M2 on days 1 and 8 every 3 weeks secondary to intolerance to the standard doses.  INTERVAL HISTORY: Renee Vance 76 y.o. female returns to the clinic today for follow-up visit. The patient is feeling fine today with no specific complaints except for fatigue and few episodes of epistaxis. She tolerated the second cycle of her systemic chemotherapy fairly well. She denied having any fever or chills. She has no nausea, vomiting, diarrhea or constipation. She denied having any bleeding or bruises. The patient denied having any recent weight loss or night sweats. She has no chest pain but continues to have shortness breath with exertion and she is currently on home oxygen. He has mild cough with no hemoptysis. She is here today for evaluation before starting cycle #3.   MEDICAL HISTORY: Past Medical History:  Diagnosis Date  . 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 Bradycardia   . CAD (coronary artery disease)   . COPD (chronic obstructive pulmonary disease) ([place])   . Coronary atherosclerosis of native coronary artery 2011  . Encounter for antineoplastic chemotherapy 06/23/2016  . Fibrocystic  breast   . History of radiation therapy 07/05/16 - 08/09/16    Left Lung: 45 Gy in 25 fractions  . HTN (hypertension)   . Hyperlipidemia   . Hypothyroidism   . Neutropenic fever ([place]) 03/15/2017  . Obesity   . Pain in joint   . Right renal mass 11/30/2016  . 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 Thrombocytopenia ([place]) 03/15/2017    ALLERGIES:  is allergic to ampicillin and codeine.  MEDICATIONS:  Current Outpatient Prescriptions  Medication Sig Dispense Refill  . amLODipine (NORVASC) 5 MG tablet Take 5 mg by mouth daily.    Marland Kitchen aspirin 81 MG tablet Take 81 mg by mouth daily.    Marland Kitchen atorvastatin (LIPITOR) 80 MG tablet Take 80 mg by mouth daily.    . budesonide (PULMICORT) 0.5 MG/2ML nebulizer solution Take 0.5 mg by nebulization 2 (two) times daily.     . feeding supplement (BOOST HIGH PROTEIN) LIQD Take 1 Container by mouth daily.    Marland Kitchen ipratropium-albuterol (DUONEB) 0.5-2.5 (3) MG/3ML SOLN Take 3 mLs by nebulization every 6 (six) hours as needed. 360 mL 0  . levothyroxine (SYNTHROID, LEVOTHROID) 125 MCG tablet Take 125 mcg by mouth daily before breakfast.     . lidocaine-prilocaine (EMLA) cream Apply 1 application topically as needed. 30 g 0  . metoprolol succinate (TOPROL-XL) 50 MG 24 hr tablet Take 1 tablet (50 mg total) by mouth 2 (two) times daily. 180 tablet 1  . Multiple Vitamins-Minerals (MULTIVITAMIN WITH MINERALS) tablet Take 1 tablet by mouth daily.    Marland Kitchen omeprazole (PRILOSEC)  20 MG capsule Take 1 capsule (20 mg total) by mouth daily. 30 capsule 2  . OXYGEN Inhale 2 L into the lungs continuous.    . Vitamin D, Cholecalciferol, 1000 UNITS CAPS Take 1,000 Units by mouth daily.      No current facility-administered medications for this visit.     SURGICAL HISTORY:  Past Surgical History:  Procedure Laterality Date  . CHOLECYSTECTOMY    . CORONARY ANGIOPLASTY WITH STENT PLACEMENT  2011   [place] 30-40%, LAD 65-75% (FFR 0.88), CFX 55-60%, RCA 95%>0 w/ 2.5 x  12 mm monorail stent  . IR FLUORO GUIDE PORT INSERTION RIGHT  04/07/2017  . IR US GUIDE VASC ACCESS RIGHT  04/07/2017  . TUBAL LIGATION    . VIDEO BRONCHOSCOPY Bilateral 06/07/2016   Procedure: VIDEO BRONCHOSCOPY WITH FLUORO;  Surgeon: [name], MD;  Location: [facility];  Service: Cardiopulmonary;  Laterality: Bilateral;    REVIEW OF SYSTEMS:  Constitutional: positive for fatigue Eyes: negative Ears, nose, mouth, throat, and face: positive for epistaxis Respiratory: positive for cough and dyspnea on exertion Cardiovascular: negative Gastrointestinal: negative Genitourinary:negative Integument/breast: negative Hematologic/lymphatic: negative Musculoskeletal:positive for muscle weakness Neurological: negative Behavioral/Psych: negative Endocrine: negative Allergic/Immunologic: negative   PHYSICAL EXAMINATION: General appearance: alert, cooperative, fatigued and no distress Head: Normocephalic, without obvious abnormality, atraumatic Neck: no adenopathy, no JVD, supple, symmetrical, trachea midline and thyroid not enlarged, symmetric, no tenderness/mass/nodules Lymph nodes: Cervical, supraclavicular, and axillary nodes normal. Resp: wheezes bilaterally Back: symmetric, no curvature. ROM normal. No CVA tenderness. Cardio: regular rate and rhythm, S1, S2 normal, no murmur, click, rub or gallop GI: soft, non-tender; bowel sounds normal; no masses,  no organomegaly Extremities: extremities normal, atraumatic, no cyanosis or edema Neurologic: Alert and oriented X 3, normal strength and tone. Normal symmetric reflexes. Normal coordination and gait  ECOG PERFORMANCE STATUS: 1 - Symptomatic but completely ambulatory  Blood pressure (!) 139/57, pulse 78, temperature 98.5 F (36.9 C), temperature source Oral, resp. rate 18, height 5\' 4"  (1.626 m), weight 145 lb 6.4 oz (66 kg), SpO2 96 %.  LABORATORY DATA: Lab Results  Component Value Date   WBC 6.0 04/26/2017   HGB 8.1 (L)  04/26/2017   HCT 24.9 (L) 04/26/2017   MCV 88.0 04/26/2017   PLT 45 (L) 04/26/2017      Chemistry      Component Value Date/Time   NA 142 04/26/2017 1122   K 4.2 04/26/2017 1122   CL 103 04/07/2017 1239   CO2 29 04/26/2017 1122   BUN 32.1 (H) 04/26/2017 1122   CREATININE 1.8 (H) 04/26/2017 1122      Component Value Date/Time   CALCIUM 9.5 04/26/2017 1122   ALKPHOS 84 04/26/2017 1122   AST 46 (H) 04/26/2017 1122   ALT 51 04/26/2017 1122   BILITOT 0.70 04/26/2017 1122       RADIOGRAPHIC STUDIES: Dg Neck Soft Tissue  Result Date: 04/13/2017 CLINICAL DATA:  History of lung cancer film the left, now with mid lower neck pain after placement of Port-A-Cath, smoking history EXAM: NECK SOFT TISSUES - 1+ VIEW COMPARISON:  Cervical spine films of 03/14/2017 FINDINGS: There is diffuse degenerative disc disease particularly from C3-C7 with loss of disc space and sclerosis with spurring. Slight anterolisthesis of C3 on C4 appears stable. The hypopharynx appears normal and the epiglottis appears normal. The lung apices are clear. IMPRESSION: 1. Straightened alignment with diffuse degenerative disc disease. 2. No acute abnormality is seen. Electronically Signed   By: Windy Canny.D.  On: 04/13/2017 12:33   Dg Chest 1 View  Result Date: 04/13/2017 CLINICAL DATA:  History of left lung carcinoma, mid to lower neck pain after placement of Port-A-Cath, smoking history EXAM: CHEST 1 VIEW COMPARISON:  Chest x-ray of 03/20/2017 and CT chest of 03/03/2017 FINDINGS: The abnormal opacity overlying the left hilum consistent with lung carcinoma is again noted with peripheral opacity consistent with pneumonitis. Linear scarring remains in the right mid lung. A right-sided Port-A-Cath is now present with the tip overlying the mid SVC. No pneumothorax is seen. There may be a small left pleural effusion present but no right effusion is noted. Heart size is stable. IMPRESSION: 1. Little change in left hilar mass and  probable peripheral pneumonitis. 2. Right-sided Port-A-Cath tip overlies the mid SVC. No pneumothorax. 3. Question small left pleural effusion. Electronically Signed   By: Ivar Drape M.D.   On: 04/13/2017 12:42   Dg Chest 2 View  Result Date: 04/20/2017 CLINICAL DATA:  Current history of left upper lobe lung cancer. Stage 4 chronic kidney disease. Current history of hypertension and COPD. EXAM: CHEST  2 VIEW COMPARISON:  04/13/2017, 03/20/2017 and earlier. FINDINGS: Cardiac silhouette mildly enlarged, unchanged. Thoracic aorta atherosclerotic, unchanged. Central left upper lobe lung mass with post obstructive atelectasis/pneumonitis and/or post treatment changes, unchanged. Interstitial disease throughout both lungs, unchanged since the examination 1 week ago but progressive since the examination 1 month ago. Stable small left pleural effusion. No new abnormalities. Right jugular Port-A-Cath tip in the mid SVC, unchanged. IMPRESSION: 1. Stable left upper lobe lung mass with post obstructive atelectasis/pneumonitis and/or post treatment changes. 2. Interstitial disease throughout both lungs which is unchanged since 1 week ago but progressive since 1 month ago, query lymphangitic spread of cancer versus acute inflammatory disease such as UIP. 3. No new abnormalities since the examination 1 week ago. Electronically Signed   By: Evangeline Dakin M.D.   On: 04/20/2017 16:03   Ir US Guide Vasc Access Right  Result Date: 04/07/2017 INDICATION: 76 year old female with a history of squamous cell carcinoma left lung EXAM: IMPLANTED PORT A CATH PLACEMENT WITH ULTRASOUND AND FLUOROSCOPIC GUIDANCE MEDICATIONS: Clindamycin 600 mg IV; The antibiotic was administered within an appropriate time interval prior to skin puncture. ANESTHESIA/SEDATION: Moderate (conscious) sedation was employed during this procedure. A total of Versed 2.0 mg and Fentanyl 100 mcg was administered intravenously. Moderate Sedation Time: 20 minutes.  The patient's level of consciousness and vital signs were monitored continuously by radiology nursing throughout the procedure under my direct supervision. FLUOROSCOPY TIME:  Zero minutes, 48 seconds (6.7 mGy) COMPLICATIONS: None PROCEDURE: The procedure, risks, benefits, and alternatives were explained to the patient. Questions regarding the procedure were encouraged and answered. The patient understands and consents to the procedure. Ultrasound survey was performed with images stored and sent to PACs. The right neck and chest was prepped with chlorhexidine, and draped in the usual sterile fashion using maximum barrier technique (cap and mask, sterile gown, sterile gloves, large sterile sheet, hand hygiene and cutaneous antiseptic). Antibiotic prophylaxis was provided with clindamycin administered IV one hour prior to skin incision. Local anesthesia was attained by infiltration with 1% lidocaine without epinephrine. Ultrasound demonstrated patency of the right internal jugular vein, and this was documented with an image. Under real-time ultrasound guidance, this vein was accessed with a 21 gauge micropuncture needle and image documentation was performed. A small dermatotomy was made at the access site with an 11 scalpel. A 0.018" wire was advanced into the SVC and  used to estimate the length of the internal catheter. The access needle exchanged for a 78F micropuncture vascular sheath. The 0.018" wire was then removed and a 0.035" wire advanced into the IVC. An appropriate location for the subcutaneous reservoir was selected below the clavicle and an incision was made through the skin and underlying soft tissues. The subcutaneous tissues were then dissected using a combination of blunt and sharp surgical technique and a pocket was formed. A single lumen power injectable portacatheter was then tunneled through the subcutaneous tissues from the pocket to the dermatotomy and the port reservoir placed within the  subcutaneous pocket. The venous access site was then serially dilated and a peel away vascular sheath placed over the wire. The wire was removed and the port catheter advanced into position under fluoroscopic guidance. The catheter tip is positioned in the cavoatrial junction. This was documented with a spot image. The portacatheter was then tested and found to flush and aspirate well. The port was flushed with saline followed by 100 units/mL heparinized saline. The pocket was then closed in two layers using first subdermal inverted interrupted absorbable sutures followed by a running subcuticular suture. The epidermis was then sealed with Dermabond. The dermatotomy at the venous access site was also seal with Dermabond. Patient tolerated the procedure well and remained hemodynamically stable throughout. No complications encountered and no significant blood loss encountered IMPRESSION: Status post right IJ port catheter.  Catheter ready for use. Signed, Dulcy Fanny. Earleen Newport, DO [facility] Radiology Electronically Signed   By: Corrie Mckusick D.O.   On: 04/07/2017 16:27   Ir Fluoro Guide Port Insertion Right  Result Date: 04/07/2017 INDICATION: 76 year old female with a history of squamous cell carcinoma left lung EXAM: IMPLANTED PORT A CATH PLACEMENT WITH ULTRASOUND AND FLUOROSCOPIC GUIDANCE MEDICATIONS: Clindamycin 600 mg IV; The antibiotic was administered within an appropriate time interval prior to skin puncture. ANESTHESIA/SEDATION: Moderate (conscious) sedation was employed during this procedure. A total of Versed 2.0 mg and Fentanyl 100 mcg was administered intravenously. Moderate Sedation Time: 20 minutes. The patient's level of consciousness and vital signs were monitored continuously by radiology nursing throughout the procedure under my direct supervision. FLUOROSCOPY TIME:  Zero minutes, 48 seconds (6.7 mGy) COMPLICATIONS: None PROCEDURE: The procedure,  risks, benefits, and alternatives were explained to the patient. Questions regarding the procedure were encouraged and answered. The patient understands and consents to the procedure. Ultrasound survey was performed with images stored and sent to PACs. The right neck and chest was prepped with chlorhexidine, and draped in the usual sterile fashion using maximum barrier technique (cap and mask, sterile gown, sterile gloves, large sterile sheet, hand hygiene and cutaneous antiseptic). Antibiotic prophylaxis was provided with clindamycin administered IV one hour prior to skin incision. Local anesthesia was attained by infiltration with 1% lidocaine without epinephrine. Ultrasound demonstrated patency of the right internal jugular vein, and this was documented with an image. Under real-time ultrasound guidance, this vein was accessed with a 21 gauge micropuncture needle and image documentation was performed. A small dermatotomy was made at the access site with an 11 scalpel. A 0.018" wire was advanced into the SVC and used to estimate the length of the internal catheter. The access needle exchanged for a 78F micropuncture vascular sheath. The 0.018" wire was then removed and a 0.035" wire advanced into the IVC. An appropriate location for the subcutaneous reservoir was selected below the clavicle and an incision was made through the skin and underlying soft  tissues. The subcutaneous tissues were then dissected using a combination of blunt and sharp surgical technique and a pocket was formed. A single lumen power injectable portacatheter was then tunneled through the subcutaneous tissues from the pocket to the dermatotomy and the port reservoir placed within the subcutaneous pocket. The venous access site was then serially dilated and a peel away vascular sheath placed over the wire. The wire was removed and the port catheter advanced into position under fluoroscopic guidance. The catheter tip is positioned in the  cavoatrial junction. This was documented with a spot image. The portacatheter was then tested and found to flush and aspirate well. The port was flushed with saline followed by 100 units/mL heparinized saline. The pocket was then closed in two layers using first subdermal inverted interrupted absorbable sutures followed by a running subcuticular suture. The epidermis was then sealed with Dermabond. The dermatotomy at the venous access site was also seal with Dermabond. Patient tolerated the procedure well and remained hemodynamically stable throughout. No complications encountered and no significant blood loss encountered IMPRESSION: Status post right IJ port catheter.  Catheter ready for use. Signed, Dulcy Fanny. Earleen Newport, DO [facility] Radiology Electronically Signed   By: Corrie Mckusick D.O.   On: 04/07/2017 16:27    ASSESSMENT AND PLAN:  This is a very pleasant 76 years old white female with metastatic non-small cell lung cancer that was initially diagnosed as unresectable a stage IIB squamous cell carcinoma status post course of concurrent chemoradiation with weekly carboplatin and paclitaxel. She was unable to receive consolidation chemotherapy at that time because of poor performance status and complete location from the previous treatment with radiation. The patient had evidence for metastatic disease based on biopsy of the left adrenal gland with PDL 1 expression of 0% The patient was started on treatment with systemic chemotherapy with reduced dose carboplatin and gemcitabine status post 2 cycles. She tolerated the second cycle will but she continues to have significant pancytopenia especially anemia and thrombocytopenia with her treatment. I recommended for the patient to delay the start of cycle #3 by 1 week until improvement of her platelets count. For the chemotherapy-induced anemia, I will arrange for the patient to receive 2 units of PRBCs  transfusion. If the patient continues to have significant pancytopenia with her treatment, I would discontinue this regimen and consider the patient for other treatment options including immunotherapy or enrollment in a clinical trial if eligible. I would see the patient back for follow-up visit in 4 weeks for evaluation after repeating CT scan of the chest, abdomen and pelvis for restaging of her disease. She was advised to call immediately if she has any concerning symptoms in the interval. The patient voices understanding of current disease status and treatment options and is in agreement with the current care plan. All questions were answered. The patient knows to call the clinic with any problems, questions or concerns. We can certainly see the patient much sooner if necessary.  Disclaimer: This note was dictated with voice recognition software. Similar sounding words can inadvertently be transcribed and may not be corrected upon review.

## 2017-04-26 NOTE — Patient Instructions (Signed)

## 2017-04-27 ENCOUNTER — Telehealth: Payer: Self-pay | Admitting: Medical Oncology

## 2017-04-27 ENCOUNTER — Ambulatory Visit: Payer: PPO

## 2017-04-27 LAB — BPAM RBC
Blood Product Expiration Date: 201808082359
Blood Product Expiration Date: 201808142359
ISSUE DATE / TIME: 201807181425
ISSUE DATE / TIME: 201807181425
UNIT TYPE AND RH: 1700
Unit Type and Rh: 1700

## 2017-04-27 LAB — TYPE AND SCREEN
ABO/RH(D): B NEG
Antibody Screen: NEGATIVE
UNIT DIVISION: 0
UNIT DIVISION: 0

## 2017-04-27 NOTE — Telephone Encounter (Signed)
Pt called to r/s appts on aug 14th to 13th due to husbands med appt on the 14th. Schedule request sent

## 2017-04-28 ENCOUNTER — Telehealth: Payer: Self-pay | Admitting: Internal Medicine

## 2017-04-28 NOTE — Telephone Encounter (Signed)
Scheduled appt per sch message from Heide Guile - patient is aware of appt time and date

## 2017-04-29 ENCOUNTER — Other Ambulatory Visit: Payer: Self-pay | Admitting: Interventional Cardiology

## 2017-05-02 DIAGNOSIS — M9903 Segmental and somatic dysfunction of lumbar region: Secondary | ICD-10-CM | POA: Diagnosis not present

## 2017-05-02 DIAGNOSIS — M9902 Segmental and somatic dysfunction of thoracic region: Secondary | ICD-10-CM | POA: Diagnosis not present

## 2017-05-02 DIAGNOSIS — M9901 Segmental and somatic dysfunction of cervical region: Secondary | ICD-10-CM | POA: Diagnosis not present

## 2017-05-02 DIAGNOSIS — M47816 Spondylosis without myelopathy or radiculopathy, lumbar region: Secondary | ICD-10-CM | POA: Diagnosis not present

## 2017-05-02 DIAGNOSIS — S134XXA Sprain of ligaments of cervical spine, initial encounter: Secondary | ICD-10-CM | POA: Diagnosis not present

## 2017-05-02 DIAGNOSIS — M546 Pain in thoracic spine: Secondary | ICD-10-CM | POA: Diagnosis not present

## 2017-05-02 DIAGNOSIS — M47812 Spondylosis without myelopathy or radiculopathy, cervical region: Secondary | ICD-10-CM | POA: Diagnosis not present

## 2017-05-03 ENCOUNTER — Other Ambulatory Visit: Payer: PPO

## 2017-05-03 ENCOUNTER — Other Ambulatory Visit (HOSPITAL_BASED_OUTPATIENT_CLINIC_OR_DEPARTMENT_OTHER): Payer: PPO

## 2017-05-03 ENCOUNTER — Ambulatory Visit (HOSPITAL_BASED_OUTPATIENT_CLINIC_OR_DEPARTMENT_OTHER): Payer: PPO

## 2017-05-03 VITALS — BP 138/66 | HR 63 | Temp 98.2°F | Resp 18

## 2017-05-03 DIAGNOSIS — C3492 Malignant neoplasm of unspecified part of left bronchus or lung: Secondary | ICD-10-CM

## 2017-05-03 DIAGNOSIS — C7972 Secondary malignant neoplasm of left adrenal gland: Secondary | ICD-10-CM

## 2017-05-03 DIAGNOSIS — Z5111 Encounter for antineoplastic chemotherapy: Secondary | ICD-10-CM | POA: Diagnosis not present

## 2017-05-03 LAB — COMPREHENSIVE METABOLIC PANEL
ALT: 28 U/L (ref 0–55)
AST: 34 U/L (ref 5–34)
Albumin: 3.1 g/dL — ABNORMAL LOW (ref 3.5–5.0)
Alkaline Phosphatase: 89 U/L (ref 40–150)
Anion Gap: 9 mEq/L (ref 3–11)
BUN: 30.4 mg/dL — AB (ref 7.0–26.0)
CALCIUM: 10.1 mg/dL (ref 8.4–10.4)
CHLORIDE: 104 meq/L (ref 98–109)
CO2: 28 meq/L (ref 22–29)
Creatinine: 1.6 mg/dL — ABNORMAL HIGH (ref 0.6–1.1)
EGFR: 30 mL/min/{1.73_m2} — ABNORMAL LOW (ref >90)
Glucose: 69 mg/dl — ABNORMAL LOW (ref 70–140)
POTASSIUM: 4.7 meq/L (ref 3.5–5.1)
Sodium: 140 mEq/L (ref 136–145)
Total Bilirubin: 0.68 mg/dL (ref 0.20–1.20)
Total Protein: 7.6 g/dL (ref 6.4–8.3)

## 2017-05-03 LAB — CBC WITH DIFFERENTIAL/PLATELET
BASO%: 0.5 % (ref 0.0–2.0)
BASOS ABS: 0 10*3/uL (ref 0.0–0.1)
EOS%: 3.2 % (ref 0.0–7.0)
Eosinophils Absolute: 0.2 10*3/uL (ref 0.0–0.5)
HEMATOCRIT: 33.3 % — AB (ref 34.8–46.6)
HGB: 10.7 g/dL — ABNORMAL LOW (ref 11.6–15.9)
LYMPH#: 1.1 10*3/uL (ref 0.9–3.3)
LYMPH%: 16.4 % (ref 14.0–49.7)
MCH: 28.8 pg (ref 25.1–34.0)
MCHC: 32.1 g/dL (ref 31.5–36.0)
MCV: 89.5 fL (ref 79.5–101.0)
MONO#: 0.7 10*3/uL (ref 0.1–0.9)
MONO%: 11.2 % (ref 0.0–14.0)
NEUT#: 4.5 10*3/uL (ref 1.5–6.5)
NEUT%: 68.7 % (ref 38.4–76.8)
Platelets: 177 10*3/uL (ref 145–400)
RBC: 3.72 10*6/uL (ref 3.70–5.45)
RDW: 16.8 % — ABNORMAL HIGH (ref 11.2–14.5)
WBC: 6.5 10*3/uL (ref 3.9–10.3)

## 2017-05-03 MED ORDER — DEXAMETHASONE SODIUM PHOSPHATE 10 MG/ML IJ SOLN
10.0000 mg | Freq: Once | INTRAMUSCULAR | Status: AC
  Administered 2017-05-03: 10 mg via INTRAVENOUS

## 2017-05-03 MED ORDER — SODIUM CHLORIDE 0.9 % IV SOLN
160.0000 mg | Freq: Once | INTRAVENOUS | Status: AC
  Administered 2017-05-03: 160 mg via INTRAVENOUS
  Filled 2017-05-03: qty 16

## 2017-05-03 MED ORDER — HEPARIN SOD (PORK) LOCK FLUSH 100 UNIT/ML IV SOLN
500.0000 [IU] | Freq: Once | INTRAVENOUS | Status: AC | PRN
  Administered 2017-05-03: 500 [IU]
  Filled 2017-05-03: qty 5

## 2017-05-03 MED ORDER — SODIUM CHLORIDE 0.9 % IV SOLN
Freq: Once | INTRAVENOUS | Status: AC
  Administered 2017-05-03: 11:00:00 via INTRAVENOUS

## 2017-05-03 MED ORDER — PALONOSETRON HCL INJECTION 0.25 MG/5ML
0.2500 mg | Freq: Once | INTRAVENOUS | Status: AC
  Administered 2017-05-03: 0.25 mg via INTRAVENOUS

## 2017-05-03 MED ORDER — DEXAMETHASONE SODIUM PHOSPHATE 10 MG/ML IJ SOLN
INTRAMUSCULAR | Status: AC
  Filled 2017-05-03: qty 1

## 2017-05-03 MED ORDER — SODIUM CHLORIDE 0.9% FLUSH
10.0000 mL | INTRAVENOUS | Status: DC | PRN
  Administered 2017-05-03: 10 mL
  Filled 2017-05-03: qty 10

## 2017-05-03 MED ORDER — SODIUM CHLORIDE 0.9 % IV SOLN
800.0000 mg/m2 | Freq: Once | INTRAVENOUS | Status: AC
  Administered 2017-05-03: 1368 mg via INTRAVENOUS
  Filled 2017-05-03: qty 35.98

## 2017-05-03 MED ORDER — PALONOSETRON HCL INJECTION 0.25 MG/5ML
INTRAVENOUS | Status: AC
  Filled 2017-05-03: qty 5

## 2017-05-03 NOTE — Patient Instructions (Signed)
[facility] Discharge Instructions for Patients Receiving Chemotherapy  Today you received the following chemotherapy agents Gemzar/Carboplatin To help prevent nausea and vomiting after your treatment, we encourage you to take your nausea medication as prescribed.    If you develop nausea and vomiting that is not controlled by your nausea medication, call the clinic.   BELOW ARE SYMPTOMS THAT SHOULD BE REPORTED IMMEDIATELY:  *FEVER GREATER THAN 100.5 F  *CHILLS WITH OR WITHOUT FEVER  NAUSEA AND VOMITING THAT IS NOT CONTROLLED WITH YOUR NAUSEA MEDICATION  *UNUSUAL SHORTNESS OF BREATH  *UNUSUAL BRUISING OR BLEEDING  TENDERNESS IN MOUTH AND THROAT WITH OR WITHOUT PRESENCE OF ULCERS  *URINARY PROBLEMS  *BOWEL PROBLEMS  UNUSUAL RASH Items with * indicate a potential emergency and should be followed up as soon as possible.  Feel free to call the clinic you have any questions or concerns. The clinic phone number is [phone].  Please show the [facility] at check-in to the Emergency Department and triage nurse.

## 2017-05-03 NOTE — Progress Notes (Signed)
Ok to treat with creatinine 1.6 per Dr. Julien Nordmann.

## 2017-05-04 DIAGNOSIS — J449 Chronic obstructive pulmonary disease, unspecified: Secondary | ICD-10-CM | POA: Diagnosis not present

## 2017-05-04 DIAGNOSIS — C3492 Malignant neoplasm of unspecified part of left bronchus or lung: Secondary | ICD-10-CM | POA: Diagnosis not present

## 2017-05-05 ENCOUNTER — Telehealth: Payer: Self-pay | Admitting: *Deleted

## 2017-05-05 NOTE — Telephone Encounter (Signed)
Patient called to say that My Chart had an appt.for chemotherapy for her on 05/10/17 and she didn't know about it and she did not think it was correct. Explained to patient that her chemotherapy was delayed on 7/18 b/c of her blood counts.  It was moved back a week, so instead of getting it on 7/18 and 7/25, she got it on 7/25 and needed to get it on 8/1.  Patient stated that if she had not had My Chart, she never would have know about the appt.. Aplogized to patient and thanked her for calling to check on this (note:  Pt. Refused AVS on 7/18 and 7/25, which would have at least shown the appt.for chemotherapy for 8/1).

## 2017-05-06 DIAGNOSIS — J449 Chronic obstructive pulmonary disease, unspecified: Secondary | ICD-10-CM | POA: Diagnosis not present

## 2017-05-06 DIAGNOSIS — C3492 Malignant neoplasm of unspecified part of left bronchus or lung: Secondary | ICD-10-CM | POA: Diagnosis not present

## 2017-05-10 ENCOUNTER — Other Ambulatory Visit (HOSPITAL_BASED_OUTPATIENT_CLINIC_OR_DEPARTMENT_OTHER): Payer: PPO

## 2017-05-10 ENCOUNTER — Ambulatory Visit (HOSPITAL_BASED_OUTPATIENT_CLINIC_OR_DEPARTMENT_OTHER): Payer: PPO | Admitting: Internal Medicine

## 2017-05-10 ENCOUNTER — Ambulatory Visit (HOSPITAL_BASED_OUTPATIENT_CLINIC_OR_DEPARTMENT_OTHER): Payer: PPO

## 2017-05-10 ENCOUNTER — Encounter: Payer: Self-pay | Admitting: Internal Medicine

## 2017-05-10 VITALS — BP 165/66 | HR 75 | Temp 97.9°F | Resp 18

## 2017-05-10 DIAGNOSIS — C7972 Secondary malignant neoplasm of left adrenal gland: Secondary | ICD-10-CM

## 2017-05-10 DIAGNOSIS — C3492 Malignant neoplasm of unspecified part of left bronchus or lung: Secondary | ICD-10-CM

## 2017-05-10 DIAGNOSIS — Z5111 Encounter for antineoplastic chemotherapy: Secondary | ICD-10-CM | POA: Diagnosis not present

## 2017-05-10 DIAGNOSIS — L539 Erythematous condition, unspecified: Secondary | ICD-10-CM | POA: Diagnosis not present

## 2017-05-10 DIAGNOSIS — R229 Localized swelling, mass and lump, unspecified: Secondary | ICD-10-CM

## 2017-05-10 DIAGNOSIS — D696 Thrombocytopenia, unspecified: Secondary | ICD-10-CM | POA: Diagnosis not present

## 2017-05-10 DIAGNOSIS — D61818 Other pancytopenia: Secondary | ICD-10-CM | POA: Diagnosis not present

## 2017-05-10 DIAGNOSIS — D649 Anemia, unspecified: Secondary | ICD-10-CM

## 2017-05-10 DIAGNOSIS — R0602 Shortness of breath: Secondary | ICD-10-CM

## 2017-05-10 DIAGNOSIS — L03113 Cellulitis of right upper limb: Secondary | ICD-10-CM

## 2017-05-10 HISTORY — DX: Cellulitis of right upper limb: L03.113

## 2017-05-10 LAB — COMPREHENSIVE METABOLIC PANEL
ALT: 58 U/L — ABNORMAL HIGH (ref 0–55)
AST: 53 U/L — ABNORMAL HIGH (ref 5–34)
Albumin: 2.9 g/dL — ABNORMAL LOW (ref 3.5–5.0)
Alkaline Phosphatase: 94 U/L (ref 40–150)
Anion Gap: 8 mEq/L (ref 3–11)
BILIRUBIN TOTAL: 0.74 mg/dL (ref 0.20–1.20)
BUN: 33.4 mg/dL — ABNORMAL HIGH (ref 7.0–26.0)
CHLORIDE: 104 meq/L (ref 98–109)
CO2: 28 meq/L (ref 22–29)
CREATININE: 1.5 mg/dL — AB (ref 0.6–1.1)
Calcium: 9.7 mg/dL (ref 8.4–10.4)
EGFR: 33 mL/min/{1.73_m2} — ABNORMAL LOW (ref >90)
GLUCOSE: 93 mg/dL (ref 70–140)
Potassium: 5.3 mEq/L — ABNORMAL HIGH (ref 3.5–5.1)
SODIUM: 140 meq/L (ref 136–145)
TOTAL PROTEIN: 7.4 g/dL (ref 6.4–8.3)

## 2017-05-10 LAB — CBC WITH DIFFERENTIAL/PLATELET
BASO%: 0.9 % (ref 0.0–2.0)
Basophils Absolute: 0 10*3/uL (ref 0.0–0.1)
EOS%: 0.8 % (ref 0.0–7.0)
Eosinophils Absolute: 0 10*3/uL (ref 0.0–0.5)
HCT: 30.1 % — ABNORMAL LOW (ref 34.8–46.6)
HGB: 10.1 g/dL — ABNORMAL LOW (ref 11.6–15.9)
LYMPH%: 11.6 % — AB (ref 14.0–49.7)
MCH: 29.2 pg (ref 25.1–34.0)
MCHC: 33.4 g/dL (ref 31.5–36.0)
MCV: 87.2 fL (ref 79.5–101.0)
MONO#: 0.3 10*3/uL (ref 0.1–0.9)
MONO%: 10.5 % (ref 0.0–14.0)
NEUT%: 76.2 % (ref 38.4–76.8)
NEUTROS ABS: 2.4 10*3/uL (ref 1.5–6.5)
Platelets: 182 10*3/uL (ref 145–400)
RBC: 3.45 10*6/uL — AB (ref 3.70–5.45)
RDW: 15.8 % — AB (ref 11.2–14.5)
WBC: 3.2 10*3/uL — ABNORMAL LOW (ref 3.9–10.3)
lymph#: 0.4 10*3/uL — ABNORMAL LOW (ref 0.9–3.3)

## 2017-05-10 MED ORDER — HEPARIN SOD (PORK) LOCK FLUSH 100 UNIT/ML IV SOLN
500.0000 [IU] | Freq: Once | INTRAVENOUS | Status: AC | PRN
  Administered 2017-05-10: 500 [IU]
  Filled 2017-05-10: qty 5

## 2017-05-10 MED ORDER — DOXYCYCLINE HYCLATE 100 MG PO TABS: 100.0000 mg | ORAL_TABLET | Freq: Two times a day (BID) | ORAL | 0 refills | Status: DC

## 2017-05-10 MED ORDER — SODIUM CHLORIDE 0.9 % IV SOLN
Freq: Once | INTRAVENOUS | Status: AC
  Administered 2017-05-10: 16:00:00 via INTRAVENOUS

## 2017-05-10 MED ORDER — SODIUM CHLORIDE 0.9% FLUSH
10.0000 mL | INTRAVENOUS | Status: DC | PRN
  Administered 2017-05-10: 10 mL
  Filled 2017-05-10: qty 10

## 2017-05-10 MED ORDER — GEMCITABINE HCL CHEMO INJECTION 1 GM/26.3ML
800.0000 mg/m2 | Freq: Once | INTRAVENOUS | Status: AC
  Administered 2017-05-10: 1368 mg via INTRAVENOUS
  Filled 2017-05-10: qty 35.98

## 2017-05-10 MED ORDER — PROCHLORPERAZINE MALEATE 10 MG PO TABS
ORAL_TABLET | ORAL | Status: AC
  Filled 2017-05-10: qty 1

## 2017-05-10 MED ORDER — PROCHLORPERAZINE MALEATE 10 MG PO TABS
10.0000 mg | ORAL_TABLET | Freq: Once | ORAL | Status: AC
  Administered 2017-05-10: 10 mg via ORAL

## 2017-05-10 NOTE — Progress Notes (Signed)
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  PRIOR THERAPY: Concurrent chemoradiation with weekly carboplatin for AUC of 2 and paclitaxel 45 MG/M2 started 07/05/2016, status post 5 cycles. Last cycle was given 08/01/2016.  CURRENT THERAPY: Systemic chemotherapy with carboplatin for AUC of 5 on day 1 and gemcitabine 1000 MG/M2 on days 1 and 8 every 3 weeks. Status post 2 cycles. Starting from cycle #2 carboplatin will be reduced to AUC of 3 on day 1 and gemcitabine 800 MG/M2 on days 1 and 8 every 3 weeks secondary to intolerance to the standard doses.  INTERVAL HISTORY: Renee Vance 76 y.o. female came to the clinic today for symptom management visit. The patient noticed a small nodule and erythema and the right cubital area started few days ago. It is a little bit tender to palpation. She denied having any fever or chills. She has not received any intravenous injection or infusion in that area. She denied having any recent trauma. She has no other symptoms and no chest pain but continues to have the baseline shortness of breath and she is currently on home oxygen. She has no cough or hemoptysis. She denied having any nausea, vomiting, diarrhea or constipation.  MEDICAL HISTORY: Past Medical History:  Diagnosis Date  . 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 Bradycardia   . CAD (coronary artery disease)   . COPD (chronic obstructive pulmonary disease) ([facility])   . Coronary atherosclerosis of native coronary artery 2011  . Encounter for antineoplastic chemotherapy 06/23/2016  . Fibrocystic breast   . History of radiation therapy 07/05/16 - 08/09/16    Left  Lung: 45 Gy in 25 fractions  . HTN (hypertension)   . Hyperlipidemia   . Hypothyroidism   . Neutropenic fever ([facility]) 03/15/2017  . Obesity   . Pain in joint   . Right renal mass 11/30/2016  . 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 Thrombocytopenia ([facility]) 03/15/2017    ALLERGIES:  is allergic to ampicillin and codeine.  MEDICATIONS:  Current Outpatient Prescriptions  Medication Sig Dispense Refill  . amLODipine (NORVASC) 5 MG tablet Take 5 mg by mouth daily.    Marland Kitchen aspirin 81 MG tablet Take 81 mg by mouth daily.    Marland Kitchen atorvastatin (LIPITOR) 80 MG tablet Take 80 mg by mouth daily.    . budesonide (PULMICORT) 0.5 MG/2ML nebulizer solution Take 0.5 mg by nebulization 2 (two) times daily.     . feeding supplement (BOOST HIGH PROTEIN) LIQD Take 1 Container by mouth daily.    Marland Kitchen ipratropium-albuterol (DUONEB) 0.5-2.5 (3) MG/3ML SOLN Take 3 mLs by nebulization every 6 (six) hours as needed. 360 mL 0  . levothyroxine (SYNTHROID, LEVOTHROID) 125 MCG tablet Take 125 mcg by mouth daily before breakfast.     . lidocaine-prilocaine (EMLA) cream Apply 1 application topically as needed. 30 g 0  . metoprolol succinate (TOPROL-XL) 50 MG 24 hr tablet TAKE ONE TABLET BY MOUTH TWICE DAILY 180 tablet 3  . Multiple Vitamins-Minerals (MULTIVITAMIN WITH MINERALS) tablet Take 1 tablet by mouth daily.    Marland Kitchen omeprazole (PRILOSEC) 20 MG capsule Take 1 capsule (20 mg total) by mouth daily. 30 capsule  2  . OXYGEN Inhale 2 L into the lungs continuous.    . Vitamin D, Cholecalciferol, 1000 UNITS CAPS Take 1,000 Units by mouth daily.      No current facility-administered medications for this visit.    Facility-Administered Medications Ordered in Other Visits  Medication Dose Route Frequency Provider Last Rate Last Dose  . sodium chloride flush (NS) 0.9 % injection 10 mL  10 mL Intracatheter PRN [name], MD   10 mL at 05/10/17 1549    SURGICAL HISTORY:  Past Surgical History:  Procedure  Laterality Date  . CHOLECYSTECTOMY    . CORONARY ANGIOPLASTY WITH STENT PLACEMENT  2011   [place] 30-40%, LAD 65-75% (FFR 0.88), CFX 55-60%, RCA 95%>0 w/ 2.5 x 12 mm monorail stent  . IR FLUORO GUIDE PORT INSERTION RIGHT  04/07/2017  . IR US GUIDE VASC ACCESS RIGHT  04/07/2017  . TUBAL LIGATION    . VIDEO BRONCHOSCOPY Bilateral 06/07/2016   Procedure: VIDEO BRONCHOSCOPY WITH FLUORO;  Surgeon: [name], MD;  Location: [facility];  Service: Cardiopulmonary;  Laterality: Bilateral;    REVIEW OF SYSTEMS:  A comprehensive review of systems was negative except for: Constitutional: positive for fatigue Respiratory: positive for dyspnea on exertion   PHYSICAL EXAMINATION: General appearance: alert, cooperative, fatigued and no distress Head: Normocephalic, without obvious abnormality, atraumatic Neck: no adenopathy, no JVD, supple, symmetrical, trachea midline and thyroid not enlarged, symmetric, no tenderness/mass/nodules Lymph nodes: Cervical, supraclavicular, and axillary nodes normal. Resp: wheezes bilaterally Back: symmetric, no curvature. ROM normal. No CVA tenderness. Cardio: regular rate and rhythm, S1, S2 normal, no murmur, click, rub or gallop GI: soft, non-tender; bowel sounds normal; no masses,  no organomegaly Extremities: Palpable nodule measuring around 1.0 cm distal to the right cubital area, mildly tender to palpation with erythema    ECOG PERFORMANCE STATUS: 1 - Symptomatic but completely ambulatory  There were no vitals taken for this visit.  LABORATORY DATA: Lab Results  Component Value Date   WBC 3.2 (L) 05/10/2017   HGB 10.1 (L) 05/10/2017   HCT 30.1 (L) 05/10/2017   MCV 87.2 05/10/2017   PLT 182 05/10/2017      Chemistry      Component Value Date/Time   NA 140 05/10/2017 1351   K 5.3 (H) 05/10/2017 1351   CL 103 04/07/2017 1239   CO2 28 05/10/2017 1351   BUN 33.4 (H) 05/10/2017 1351   CREATININE 1.5 (H) 05/10/2017 1351      Component Value Date/Time     CALCIUM 9.7 05/10/2017 1351   ALKPHOS 94 05/10/2017 1351   AST 53 (H) 05/10/2017 1351   ALT 58 (H) 05/10/2017 1351   BILITOT 0.74 05/10/2017 1351       RADIOGRAPHIC STUDIES: Dg Neck Soft Tissue  Result Date: 04/13/2017 CLINICAL DATA:  History of lung cancer film the left, now with mid lower neck pain after placement of Port-A-Cath, smoking history EXAM: NECK SOFT TISSUES - 1+ VIEW COMPARISON:  Cervical spine films of 03/14/2017 FINDINGS: There is diffuse degenerative disc disease particularly from C3-C7 with loss of disc space and sclerosis with spurring. Slight anterolisthesis of C3 on C4 appears stable. The hypopharynx appears normal and the epiglottis appears normal. The lung apices are clear. IMPRESSION: 1. Straightened alignment with diffuse degenerative disc disease. 2. No acute abnormality is seen. Electronically Signed   By: Ivar Drape M.D.   On: 04/13/2017 12:33   Dg Chest 1 View  Result Date: 04/13/2017 CLINICAL DATA:  History of  left lung carcinoma, mid to lower neck pain after placement of Port-A-Cath, smoking history EXAM: CHEST 1 VIEW COMPARISON:  Chest x-ray of 03/20/2017 and CT chest of 03/03/2017 FINDINGS: The abnormal opacity overlying the left hilum consistent with lung carcinoma is again noted with peripheral opacity consistent with pneumonitis. Linear scarring remains in the right mid lung. A right-sided Port-A-Cath is now present with the tip overlying the mid SVC. No pneumothorax is seen. There may be a small left pleural effusion present but no right effusion is noted. Heart size is stable. IMPRESSION: 1. Little change in left hilar mass and probable peripheral pneumonitis. 2. Right-sided Port-A-Cath tip overlies the mid SVC. No pneumothorax. 3. Question small left pleural effusion. Electronically Signed   By: Ivar Drape M.D.   On: 04/13/2017 12:42   Dg Chest 2 View  Result Date: 04/20/2017 CLINICAL DATA:  Current history of left upper lobe lung cancer. Stage 4 chronic  kidney disease. Current history of hypertension and COPD. EXAM: CHEST  2 VIEW COMPARISON:  04/13/2017, 03/20/2017 and earlier. FINDINGS: Cardiac silhouette mildly enlarged, unchanged. Thoracic aorta atherosclerotic, unchanged. Central left upper lobe lung mass with post obstructive atelectasis/pneumonitis and/or post treatment changes, unchanged. Interstitial disease throughout both lungs, unchanged since the examination 1 week ago but progressive since the examination 1 month ago. Stable small left pleural effusion. No new abnormalities. Right jugular Port-A-Cath tip in the mid SVC, unchanged. IMPRESSION: 1. Stable left upper lobe lung mass with post obstructive atelectasis/pneumonitis and/or post treatment changes. 2. Interstitial disease throughout both lungs which is unchanged since 1 week ago but progressive since 1 month ago, query lymphangitic spread of cancer versus acute inflammatory disease such as UIP. 3. No new abnormalities since the examination 1 week ago. Electronically Signed   By: Evangeline Dakin M.D.   On: 04/20/2017 16:03    ASSESSMENT AND PLAN:  This is a very pleasant 76 years old white female with metastatic non-small cell lung cancer that was initially diagnosed as unresectable a stage IIB squamous cell carcinoma status post course of concurrent chemoradiation with weekly carboplatin and paclitaxel. She was unable to receive consolidation chemotherapy at that time because of poor performance status and complete location from the previous treatment with radiation. The patient had evidence for metastatic disease based on biopsy of the left adrenal gland with PDL 1 expression of 0% The patient was started on treatment with systemic chemotherapy with reduced dose carboplatin and gemcitabine status post 2 cycles and she is currently undergoing cycle #3. She tolerated the second cycle will but she continues to have significant pancytopenia especially anemia and thrombocytopenia with her  treatment. I recommended for the patient to continue her treatment as scheduled. She completed day 8 of cycle #3 today. She would come back for follow-up visit in 2 weeks for evaluation before starting cycle #4. For the erythema and nodule in the right forearm, this could be secondary to cellulitis or subcutaneous metastasis. I will start the patient empirically on doxycycline 100 mg by mouth twice a day for the next 7 days. The patient was advised to call us if there is no improvement in her condition and the next few days. The patient voices understanding of current disease status and treatment options and is in agreement with the current care plan. All questions were answered. The patient knows to call the clinic with any problems, questions or concerns. We can certainly see the patient much sooner if necessary.  Disclaimer: This note was dictated with voice recognition  software. Similar sounding words can inadvertently be transcribed and may not be corrected upon review.

## 2017-05-10 NOTE — Progress Notes (Signed)
Per Dr. Julien Nordmann, ok to treat with Creat of 1.5.  Patient has a lump underneath the skin in her R antecubital area that is hardened. The area is warm to the touch and pink. It does not itch, but "aches slightly." Dr. Julien Nordmann will see the patient after her infusion.

## 2017-05-10 NOTE — Patient Instructions (Signed)
[facility] Discharge Instructions for Patients Receiving Chemotherapy  Today you received the following chemotherapy agents Gemzar  To help prevent nausea and vomiting after your treatment, we encourage you to take your nausea medication as prescribed   If you develop nausea and vomiting that is not controlled by your nausea medication, call the clinic.   BELOW ARE SYMPTOMS THAT SHOULD BE REPORTED IMMEDIATELY:  *FEVER GREATER THAN 100.5 F  *CHILLS WITH OR WITHOUT FEVER  NAUSEA AND VOMITING THAT IS NOT CONTROLLED WITH YOUR NAUSEA MEDICATION  *UNUSUAL SHORTNESS OF BREATH  *UNUSUAL BRUISING OR BLEEDING  TENDERNESS IN MOUTH AND THROAT WITH OR WITHOUT PRESENCE OF ULCERS  *URINARY PROBLEMS  *BOWEL PROBLEMS  UNUSUAL RASH Items with * indicate a potential emergency and should be followed up as soon as possible.  Feel free to call the clinic you have any questions or concerns. The clinic phone number is [phone].  Please show the CHEMO ALERT CARD at check-in to the Emergency Department and triage nurse.   

## 2017-05-15 DIAGNOSIS — M546 Pain in thoracic spine: Secondary | ICD-10-CM | POA: Diagnosis not present

## 2017-05-15 DIAGNOSIS — M9901 Segmental and somatic dysfunction of cervical region: Secondary | ICD-10-CM | POA: Diagnosis not present

## 2017-05-15 DIAGNOSIS — M9902 Segmental and somatic dysfunction of thoracic region: Secondary | ICD-10-CM | POA: Diagnosis not present

## 2017-05-15 DIAGNOSIS — S134XXA Sprain of ligaments of cervical spine, initial encounter: Secondary | ICD-10-CM | POA: Diagnosis not present

## 2017-05-15 DIAGNOSIS — M9903 Segmental and somatic dysfunction of lumbar region: Secondary | ICD-10-CM | POA: Diagnosis not present

## 2017-05-15 DIAGNOSIS — M47816 Spondylosis without myelopathy or radiculopathy, lumbar region: Secondary | ICD-10-CM | POA: Diagnosis not present

## 2017-05-15 DIAGNOSIS — M47812 Spondylosis without myelopathy or radiculopathy, cervical region: Secondary | ICD-10-CM | POA: Diagnosis not present

## 2017-05-16 ENCOUNTER — Telehealth: Payer: Self-pay | Admitting: *Deleted

## 2017-05-16 ENCOUNTER — Telehealth: Payer: Self-pay

## 2017-05-16 ENCOUNTER — Other Ambulatory Visit: Payer: Self-pay | Admitting: Medical Oncology

## 2017-05-16 DIAGNOSIS — C3492 Malignant neoplasm of unspecified part of left bronchus or lung: Secondary | ICD-10-CM

## 2017-05-16 DIAGNOSIS — M47816 Spondylosis without myelopathy or radiculopathy, lumbar region: Secondary | ICD-10-CM | POA: Diagnosis not present

## 2017-05-16 DIAGNOSIS — M546 Pain in thoracic spine: Secondary | ICD-10-CM | POA: Diagnosis not present

## 2017-05-16 DIAGNOSIS — M9903 Segmental and somatic dysfunction of lumbar region: Secondary | ICD-10-CM | POA: Diagnosis not present

## 2017-05-16 DIAGNOSIS — S134XXA Sprain of ligaments of cervical spine, initial encounter: Secondary | ICD-10-CM | POA: Diagnosis not present

## 2017-05-16 DIAGNOSIS — M47812 Spondylosis without myelopathy or radiculopathy, cervical region: Secondary | ICD-10-CM | POA: Diagnosis not present

## 2017-05-16 DIAGNOSIS — M9901 Segmental and somatic dysfunction of cervical region: Secondary | ICD-10-CM | POA: Diagnosis not present

## 2017-05-16 DIAGNOSIS — M9902 Segmental and somatic dysfunction of thoracic region: Secondary | ICD-10-CM | POA: Diagnosis not present

## 2017-05-16 NOTE — Telephone Encounter (Signed)
Pt called and left message about experiencing blood clot from nose.  Wanting to know if Dr. Julien Nordmann would want to see pt again.  Spoke with pt and was informed that she experiences bright red blood clots from nose every time pt blows her nose.  Stated very infrequent , pt has a drip or two of blood from nose. Stated the last time pt had clots from nose, pt had to receive blood transfusion, and not able to receive chemo. Pt has CT scan appt on 8/9.  Wanted to know if pt should be seen for blood transfusion if needed.

## 2017-05-16 NOTE — Telephone Encounter (Signed)
Spoke with patient and she is aware of her new appt times due to 8/7 los from [place] schedule ([name])   Renee Vance

## 2017-05-16 NOTE — Telephone Encounter (Signed)
Schedule message sent to add lab appt Thursday and to call pt.

## 2017-05-17 ENCOUNTER — Telehealth: Payer: Self-pay | Admitting: Internal Medicine

## 2017-05-17 DIAGNOSIS — S134XXA Sprain of ligaments of cervical spine, initial encounter: Secondary | ICD-10-CM | POA: Diagnosis not present

## 2017-05-17 DIAGNOSIS — M9903 Segmental and somatic dysfunction of lumbar region: Secondary | ICD-10-CM | POA: Diagnosis not present

## 2017-05-17 DIAGNOSIS — M9901 Segmental and somatic dysfunction of cervical region: Secondary | ICD-10-CM | POA: Diagnosis not present

## 2017-05-17 DIAGNOSIS — M9902 Segmental and somatic dysfunction of thoracic region: Secondary | ICD-10-CM | POA: Diagnosis not present

## 2017-05-17 DIAGNOSIS — M47812 Spondylosis without myelopathy or radiculopathy, cervical region: Secondary | ICD-10-CM | POA: Diagnosis not present

## 2017-05-17 DIAGNOSIS — M47816 Spondylosis without myelopathy or radiculopathy, lumbar region: Secondary | ICD-10-CM | POA: Diagnosis not present

## 2017-05-17 DIAGNOSIS — M546 Pain in thoracic spine: Secondary | ICD-10-CM | POA: Diagnosis not present

## 2017-05-17 NOTE — Telephone Encounter (Signed)
sw pt to confirm added lab appt 8/9 at 1230 per sch msg

## 2017-05-18 ENCOUNTER — Other Ambulatory Visit: Payer: Self-pay | Admitting: Medical Oncology

## 2017-05-18 ENCOUNTER — Ambulatory Visit (HOSPITAL_COMMUNITY)
Admission: RE | Admit: 2017-05-18 | Discharge: 2017-05-18 | Disposition: A | Discharge status: Home / Self Care | Payer: PPO | Source: Ambulatory Visit | Attending: Internal Medicine | Admitting: Internal Medicine

## 2017-05-18 ENCOUNTER — Telehealth: Payer: Self-pay | Admitting: Internal Medicine

## 2017-05-18 ENCOUNTER — Telehealth: Payer: Self-pay | Admitting: Medical Oncology

## 2017-05-18 ENCOUNTER — Other Ambulatory Visit (HOSPITAL_BASED_OUTPATIENT_CLINIC_OR_DEPARTMENT_OTHER): Payer: PPO

## 2017-05-18 DIAGNOSIS — I2510 Atherosclerotic heart disease of native coronary artery without angina pectoris: Secondary | ICD-10-CM | POA: Diagnosis not present

## 2017-05-18 DIAGNOSIS — D6481 Anemia due to antineoplastic chemotherapy: Secondary | ICD-10-CM | POA: Insufficient documentation

## 2017-05-18 DIAGNOSIS — M1288 Other specific arthropathies, not elsewhere classified, other specified site: Secondary | ICD-10-CM | POA: Diagnosis not present

## 2017-05-18 DIAGNOSIS — C7972 Secondary malignant neoplasm of left adrenal gland: Secondary | ICD-10-CM

## 2017-05-18 DIAGNOSIS — J9611 Chronic respiratory failure with hypoxia: Secondary | ICD-10-CM | POA: Diagnosis not present

## 2017-05-18 DIAGNOSIS — J90 Pleural effusion, not elsewhere classified: Secondary | ICD-10-CM | POA: Insufficient documentation

## 2017-05-18 DIAGNOSIS — J439 Emphysema, unspecified: Secondary | ICD-10-CM | POA: Insufficient documentation

## 2017-05-18 DIAGNOSIS — I700 Atherosclerosis of aorta: Secondary | ICD-10-CM | POA: Diagnosis not present

## 2017-05-18 DIAGNOSIS — M4316 Spondylolisthesis, lumbar region: Secondary | ICD-10-CM | POA: Insufficient documentation

## 2017-05-18 DIAGNOSIS — T451X5A Adverse effect of antineoplastic and immunosuppressive drugs, initial encounter: Secondary | ICD-10-CM

## 2017-05-18 DIAGNOSIS — Z5111 Encounter for antineoplastic chemotherapy: Secondary | ICD-10-CM

## 2017-05-18 DIAGNOSIS — I313 Pericardial effusion (noninflammatory): Secondary | ICD-10-CM | POA: Insufficient documentation

## 2017-05-18 DIAGNOSIS — J8410 Pulmonary fibrosis, unspecified: Secondary | ICD-10-CM | POA: Diagnosis not present

## 2017-05-18 DIAGNOSIS — R109 Unspecified abdominal pain: Secondary | ICD-10-CM | POA: Diagnosis not present

## 2017-05-18 DIAGNOSIS — D696 Thrombocytopenia, unspecified: Secondary | ICD-10-CM

## 2017-05-18 DIAGNOSIS — E279 Disorder of adrenal gland, unspecified: Secondary | ICD-10-CM | POA: Insufficient documentation

## 2017-05-18 DIAGNOSIS — N289 Disorder of kidney and ureter, unspecified: Secondary | ICD-10-CM | POA: Insufficient documentation

## 2017-05-18 DIAGNOSIS — C3492 Malignant neoplasm of unspecified part of left bronchus or lung: Secondary | ICD-10-CM | POA: Diagnosis not present

## 2017-05-18 LAB — COMPREHENSIVE METABOLIC PANEL
ALBUMIN: 2.7 g/dL — AB (ref 3.5–5.0)
ALK PHOS: 88 U/L (ref 40–150)
ALT: 52 U/L (ref 0–55)
ANION GAP: 8 meq/L (ref 3–11)
AST: 53 U/L — ABNORMAL HIGH (ref 5–34)
BILIRUBIN TOTAL: 1.02 mg/dL (ref 0.20–1.20)
BUN: 36.2 mg/dL — ABNORMAL HIGH (ref 7.0–26.0)
CALCIUM: 9.9 mg/dL (ref 8.4–10.4)
CO2: 29 mEq/L (ref 22–29)
Chloride: 103 mEq/L (ref 98–109)
Creatinine: 1.7 mg/dL — ABNORMAL HIGH (ref 0.6–1.1)
EGFR: 29 mL/min/{1.73_m2} — AB (ref >90)
GLUCOSE: 80 mg/dL (ref 70–140)
Potassium: 5 mEq/L (ref 3.5–5.1)
Sodium: 141 mEq/L (ref 136–145)
TOTAL PROTEIN: 7 g/dL (ref 6.4–8.3)

## 2017-05-18 LAB — CBC WITH DIFFERENTIAL/PLATELET
BASO%: 0.3 % (ref 0.0–2.0)
BASOS ABS: 0 10*3/uL (ref 0.0–0.1)
EOS ABS: 0 10*3/uL (ref 0.0–0.5)
EOS%: 0.6 % (ref 0.0–7.0)
HEMATOCRIT: 24.2 % — AB (ref 34.8–46.6)
HEMOGLOBIN: 8 g/dL — AB (ref 11.6–15.9)
LYMPH#: 0.4 10*3/uL — AB (ref 0.9–3.3)
LYMPH%: 11.8 % — ABNORMAL LOW (ref 14.0–49.7)
MCH: 28.7 pg (ref 25.1–34.0)
MCHC: 33.1 g/dL (ref 31.5–36.0)
MCV: 86.7 fL (ref 79.5–101.0)
MONO#: 0.5 10*3/uL (ref 0.1–0.9)
MONO%: 14.5 % — ABNORMAL HIGH (ref 0.0–14.0)
NEUT#: 2.4 10*3/uL (ref 1.5–6.5)
NEUT%: 72.8 % (ref 38.4–76.8)
NRBC: 0 % (ref 0–0)
PLATELETS: 20 10*3/uL — AB (ref 145–400)
RBC: 2.79 10*6/uL — ABNORMAL LOW (ref 3.70–5.45)
RDW: 16.6 % — ABNORMAL HIGH (ref 11.2–14.5)
WBC: 3.3 10*3/uL — ABNORMAL LOW (ref 3.9–10.3)

## 2017-05-18 NOTE — Telephone Encounter (Signed)
sw pt to confirm infusion appt 8/11 at 0930

## 2017-05-18 NOTE — Telephone Encounter (Signed)
Pt called back and left message . I returned call but line busy .She is scheduled for plts on sat . Need to confirm appt

## 2017-05-18 NOTE — Telephone Encounter (Addendum)
I left message that she needs platelets and to call back when she gets message. Schedule message sent and lab sending blood to [facility].

## 2017-05-19 ENCOUNTER — Other Ambulatory Visit: Payer: Self-pay | Admitting: Medical Oncology

## 2017-05-19 ENCOUNTER — Ambulatory Visit (HOSPITAL_COMMUNITY)
Admission: RE | Admit: 2017-05-19 | Discharge: 2017-05-19 | Disposition: A | Discharge status: Home / Self Care | Payer: PPO | Source: Ambulatory Visit | Attending: Internal Medicine | Admitting: Internal Medicine

## 2017-05-19 DIAGNOSIS — M9901 Segmental and somatic dysfunction of cervical region: Secondary | ICD-10-CM | POA: Diagnosis not present

## 2017-05-19 DIAGNOSIS — D6481 Anemia due to antineoplastic chemotherapy: Secondary | ICD-10-CM

## 2017-05-19 DIAGNOSIS — D696 Thrombocytopenia, unspecified: Secondary | ICD-10-CM

## 2017-05-19 DIAGNOSIS — M47812 Spondylosis without myelopathy or radiculopathy, cervical region: Secondary | ICD-10-CM | POA: Diagnosis not present

## 2017-05-19 DIAGNOSIS — M9903 Segmental and somatic dysfunction of lumbar region: Secondary | ICD-10-CM | POA: Diagnosis not present

## 2017-05-19 DIAGNOSIS — M546 Pain in thoracic spine: Secondary | ICD-10-CM | POA: Diagnosis not present

## 2017-05-19 DIAGNOSIS — S134XXA Sprain of ligaments of cervical spine, initial encounter: Secondary | ICD-10-CM | POA: Diagnosis not present

## 2017-05-19 DIAGNOSIS — M47816 Spondylosis without myelopathy or radiculopathy, lumbar region: Secondary | ICD-10-CM | POA: Diagnosis not present

## 2017-05-19 DIAGNOSIS — M9902 Segmental and somatic dysfunction of thoracic region: Secondary | ICD-10-CM | POA: Diagnosis not present

## 2017-05-19 DIAGNOSIS — T451X5A Adverse effect of antineoplastic and immunosuppressive drugs, initial encounter: Secondary | ICD-10-CM | POA: Insufficient documentation

## 2017-05-20 ENCOUNTER — Ambulatory Visit: Payer: PPO

## 2017-05-20 DIAGNOSIS — T451X5A Adverse effect of antineoplastic and immunosuppressive drugs, initial encounter: Secondary | ICD-10-CM | POA: Diagnosis not present

## 2017-05-20 DIAGNOSIS — D6481 Anemia due to antineoplastic chemotherapy: Secondary | ICD-10-CM | POA: Diagnosis not present

## 2017-05-20 DIAGNOSIS — D696 Thrombocytopenia, unspecified: Secondary | ICD-10-CM

## 2017-05-20 MED ORDER — DIPHENHYDRAMINE HCL 25 MG PO CAPS
ORAL_CAPSULE | ORAL | Status: AC
  Filled 2017-05-20: qty 1

## 2017-05-20 MED ORDER — SODIUM CHLORIDE 0.9 % IV SOLN
250.0000 mL | Freq: Once | INTRAVENOUS | Status: AC
  Administered 2017-05-20: 250 mL via INTRAVENOUS

## 2017-05-20 MED ORDER — ACETAMINOPHEN 325 MG PO TABS
ORAL_TABLET | ORAL | Status: AC
  Filled 2017-05-20: qty 2

## 2017-05-20 MED ORDER — ACETAMINOPHEN 325 MG PO TABS
650.0000 mg | ORAL_TABLET | Freq: Once | ORAL | Status: AC
  Administered 2017-05-20: 650 mg via ORAL

## 2017-05-20 MED ORDER — SODIUM CHLORIDE 0.9% FLUSH
10.0000 mL | INTRAVENOUS | Status: AC | PRN
  Administered 2017-05-20: 10 mL
  Filled 2017-05-20: qty 10

## 2017-05-20 MED ORDER — DIPHENHYDRAMINE HCL 25 MG PO CAPS
25.0000 mg | ORAL_CAPSULE | Freq: Once | ORAL | Status: AC
  Administered 2017-05-20: 25 mg via ORAL

## 2017-05-20 MED ORDER — HEPARIN SOD (PORK) LOCK FLUSH 100 UNIT/ML IV SOLN
500.0000 [IU] | Freq: Every day | INTRAVENOUS | Status: AC | PRN
  Administered 2017-05-20: 500 [IU]
  Filled 2017-05-20: qty 5

## 2017-05-20 NOTE — Patient Instructions (Signed)
Platelet Transfusion, Care After °Refer to this sheet in the next few weeks. These instructions provide you with information about caring for yourself after your procedure. Your health care provider may also give you more specific instructions. Your treatment has been planned according to current medical practices, but problems sometimes occur. Call your health care provider if you have any problems or questions after your procedure. °What can I expect after the procedure? °After the procedure, it is common to have: °· Bruising and soreness at the IV site. °· Fever or chills within the first 48 hours of your transfusion. ° °Follow these instructions at home: °· Take medicines only as directed by your health care provider. Ask your health care provider if you can take an over-the-counter pain reliever in case you have a fever or headache a day or two after your transfusion. °· Return to your normal activities as directed by your health care provider. °Contact a health care provider if: °· You have a fever. °· You have a headache. °· You have redness, swelling, or pain at your IV site. °· You have skin itching or a rash. °· You vomit. °· You feel unusually tired or weak. °Get help right away if: °· You have trouble breathing. °· You have a decreased amount of urine or you urinate less often than you normally do. °· Your urine is darker than normal. °· You have pain in your back, abdomen, or chest. °· You have cool, clammy skin. °· You have a rapid heartbeat. °This information is not intended to replace advice given to you by your health care provider. Make sure you discuss any questions you have with your health care provider. °Document Released: 10/17/2014 Document Revised: 03/03/2016 Document Reviewed: 08/06/2014 °Elsevier Interactive Patient Education © 2018 [employer] ° °

## 2017-05-22 ENCOUNTER — Ambulatory Visit: Payer: PPO

## 2017-05-22 ENCOUNTER — Ambulatory Visit (HOSPITAL_BASED_OUTPATIENT_CLINIC_OR_DEPARTMENT_OTHER): Payer: PPO

## 2017-05-22 ENCOUNTER — Ambulatory Visit (HOSPITAL_BASED_OUTPATIENT_CLINIC_OR_DEPARTMENT_OTHER): Payer: PPO | Admitting: Oncology

## 2017-05-22 ENCOUNTER — Other Ambulatory Visit (HOSPITAL_BASED_OUTPATIENT_CLINIC_OR_DEPARTMENT_OTHER): Payer: PPO

## 2017-05-22 ENCOUNTER — Encounter: Payer: Self-pay | Admitting: Oncology

## 2017-05-22 ENCOUNTER — Other Ambulatory Visit: Payer: PPO

## 2017-05-22 ENCOUNTER — Other Ambulatory Visit: Payer: Self-pay | Admitting: Medical Oncology

## 2017-05-22 ENCOUNTER — Ambulatory Visit: Payer: PPO | Admitting: Nurse Practitioner

## 2017-05-22 VITALS — BP 155/58 | HR 80 | Temp 98.5°F | Resp 21 | Ht 64.0 in | Wt 153.7 lb

## 2017-05-22 DIAGNOSIS — T451X5A Adverse effect of antineoplastic and immunosuppressive drugs, initial encounter: Secondary | ICD-10-CM

## 2017-05-22 DIAGNOSIS — C7972 Secondary malignant neoplasm of left adrenal gland: Secondary | ICD-10-CM

## 2017-05-22 DIAGNOSIS — Z95828 Presence of other vascular implants and grafts: Secondary | ICD-10-CM | POA: Insufficient documentation

## 2017-05-22 DIAGNOSIS — J449 Chronic obstructive pulmonary disease, unspecified: Secondary | ICD-10-CM | POA: Diagnosis not present

## 2017-05-22 DIAGNOSIS — D6959 Other secondary thrombocytopenia: Secondary | ICD-10-CM

## 2017-05-22 DIAGNOSIS — I313 Pericardial effusion (noninflammatory): Secondary | ICD-10-CM | POA: Diagnosis not present

## 2017-05-22 DIAGNOSIS — I3139 Other pericardial effusion (noninflammatory): Secondary | ICD-10-CM

## 2017-05-22 DIAGNOSIS — C3410 Malignant neoplasm of upper lobe, unspecified bronchus or lung: Secondary | ICD-10-CM | POA: Diagnosis not present

## 2017-05-22 DIAGNOSIS — C3492 Malignant neoplasm of unspecified part of left bronchus or lung: Secondary | ICD-10-CM

## 2017-05-22 DIAGNOSIS — D696 Thrombocytopenia, unspecified: Secondary | ICD-10-CM

## 2017-05-22 DIAGNOSIS — D6481 Anemia due to antineoplastic chemotherapy: Secondary | ICD-10-CM

## 2017-05-22 DIAGNOSIS — Z5111 Encounter for antineoplastic chemotherapy: Secondary | ICD-10-CM

## 2017-05-22 LAB — COMPREHENSIVE METABOLIC PANEL
ALBUMIN: 2.6 g/dL — AB (ref 3.5–5.0)
ALK PHOS: 78 U/L (ref 40–150)
ALT: 28 U/L (ref 0–55)
AST: 32 U/L (ref 5–34)
Anion Gap: 7 mEq/L (ref 3–11)
BILIRUBIN TOTAL: 0.82 mg/dL (ref 0.20–1.20)
BUN: 28.8 mg/dL — ABNORMAL HIGH (ref 7.0–26.0)
CO2: 29 meq/L (ref 22–29)
CREATININE: 1.5 mg/dL — AB (ref 0.6–1.1)
Calcium: 9.2 mg/dL (ref 8.4–10.4)
Chloride: 104 mEq/L (ref 98–109)
EGFR: 33 mL/min/{1.73_m2} — ABNORMAL LOW (ref >90)
GLUCOSE: 120 mg/dL (ref 70–140)
Potassium: 4.4 mEq/L (ref 3.5–5.1)
SODIUM: 140 meq/L (ref 136–145)
TOTAL PROTEIN: 6.2 g/dL — AB (ref 6.4–8.3)

## 2017-05-22 LAB — CBC WITH DIFFERENTIAL/PLATELET
BASO%: 0.2 % (ref 0.0–2.0)
Basophils Absolute: 0 10*3/uL (ref 0.0–0.1)
EOS ABS: 0 10*3/uL (ref 0.0–0.5)
EOS%: 0.7 % (ref 0.0–7.0)
HCT: 18.7 % — ABNORMAL LOW (ref 34.8–46.6)
HEMOGLOBIN: 6.3 g/dL — AB (ref 11.6–15.9)
LYMPH%: 6.1 % — AB (ref 14.0–49.7)
MCH: 29.7 pg (ref 25.1–34.0)
MCHC: 33.8 g/dL (ref 31.5–36.0)
MCV: 87.9 fL (ref 79.5–101.0)
MONO#: 0.7 10*3/uL (ref 0.1–0.9)
MONO%: 16.8 % — AB (ref 0.0–14.0)
NEUT%: 76.2 % (ref 38.4–76.8)
NEUTROS ABS: 3 10*3/uL (ref 1.5–6.5)
PLATELETS: 50 10*3/uL — AB (ref 145–400)
RBC: 2.12 10*6/uL — AB (ref 3.70–5.45)
RDW: 17 % — AB (ref 11.2–14.5)
WBC: 3.9 10*3/uL (ref 3.9–10.3)
lymph#: 0.2 10*3/uL — ABNORMAL LOW (ref 0.9–3.3)

## 2017-05-22 LAB — PREPARE PLATELET PHERESIS: UNIT DIVISION: 0

## 2017-05-22 LAB — BPAM PLATELET PHERESIS
BLOOD PRODUCT EXPIRATION DATE: 201808132359
ISSUE DATE / TIME: 201808111005
Unit Type and Rh: 7300

## 2017-05-22 LAB — PREPARE RBC (CROSSMATCH)

## 2017-05-22 MED ORDER — SODIUM CHLORIDE 0.9% FLUSH
10.0000 mL | Freq: Once | INTRAVENOUS | Status: AC
  Administered 2017-05-22: 10 mL
  Filled 2017-05-22: qty 10

## 2017-05-22 MED ORDER — SODIUM CHLORIDE 0.9 % IV SOLN: 250.0000 mL | Freq: Once | INTRAVENOUS | Status: DC

## 2017-05-22 MED ORDER — DIPHENHYDRAMINE HCL 25 MG PO CAPS
ORAL_CAPSULE | ORAL | Status: AC
  Filled 2017-05-22: qty 1

## 2017-05-22 MED ORDER — HEPARIN SOD (PORK) LOCK FLUSH 100 UNIT/ML IV SOLN
500.0000 [IU] | Freq: Every day | INTRAVENOUS | Status: DC | PRN
  Filled 2017-05-22: qty 5

## 2017-05-22 MED ORDER — SODIUM CHLORIDE 0.9% FLUSH
10.0000 mL | INTRAVENOUS | Status: DC | PRN
  Filled 2017-05-22: qty 10

## 2017-05-22 MED ORDER — DIPHENHYDRAMINE HCL 25 MG PO CAPS
25.0000 mg | ORAL_CAPSULE | Freq: Once | ORAL | Status: AC
  Administered 2017-05-22: 25 mg via ORAL

## 2017-05-22 MED ORDER — ACETAMINOPHEN 325 MG PO TABS
650.0000 mg | ORAL_TABLET | Freq: Once | ORAL | Status: AC
  Administered 2017-05-22: 650 mg via ORAL

## 2017-05-22 MED ORDER — ACETAMINOPHEN 325 MG PO TABS
ORAL_TABLET | ORAL | Status: AC
  Filled 2017-05-22: qty 2

## 2017-05-22 NOTE — Assessment & Plan Note (Signed)
This is a very pleasant 76 year old white female with metastatic non-small cell lung cancer that was initially diagnosed as unresectable a stage IIB squamous cell carcinoma status post course of concurrent chemoradiation with weekly carboplatin and paclitaxel. She was unable to receive consolidation chemotherapy at that time because of poor performance status and complete location from the previous treatment with radiation. The patient had evidence for metastatic disease based on biopsy of the left adrenal gland with PDL 1 expression of 0% The patient was started on treatment with systemic chemotherapy with reduced dose carboplatin and gemcitabine status post 3 cycles. She tolerated the third cycle, but continues to have significant pancytopenia especially anemia and thrombocytopenia with her treatment.  Patient was seen with Dr. Julien Nordmann. CT scan results were discussed with the patient and her husband. The patient is noted to have an enlarging mass in the left adrenal gland. Recommend that we stop chemotherapy at this time and refer her to radiation oncology for consideration of radiation to this area. Referral has been made to Dr. Sondra Come.   We will plan to see her back in medical oncology in approximately 3 months with a restaging CT scan prior to that visit.

## 2017-05-22 NOTE — Assessment & Plan Note (Signed)
The patient has thrombocytopenia due to her chemotherapy. She received a unit of platelets this past weekend with improvement of her platelet count to 50,000. She has no active bleeding. We anticipate that her counts will recover since her chemotherapy is being stopped at this time.

## 2017-05-22 NOTE — Assessment & Plan Note (Signed)
The patient is anemic due to her chemotherapy. She is asymptomatic today. The patient will receive 2 units of packed red blood cells here in our office.

## 2017-05-22 NOTE — Patient Instructions (Signed)

## 2017-05-22 NOTE — Progress Notes (Signed)
[facility] Follow up:    [name] [id number]   DIAGNOSIS: Cancer Staging Stage IV squamous cell carcinoma of left lung [name]) Staging form: Lung, AJCC 7th Edition - Clinical stage from 07/11/2016: Stage IIA (T2a, N1, M0) - Signed by [name], MD on 07/11/2016 Metastatic non-small cell lung cancer initially diagnosed as Stage IIB (T2a, N1, M0) non-small cell lung cancer, squamous cell carcinoma presented with left hilar mass diagnosed in August 2017.  SUMMARY OF ONCOLOGIC HISTORY:  No history exists.   PRIOR THERAPY: Concurrent chemoradiation with weekly carboplatin for AUC of 2 and paclitaxel 45 MG/M2 started 07/05/2016, status post 5 cycles. Last cycle was given 08/01/2016.  CURRENT THERAPY: Systemic chemotherapy with carboplatin for AUC of 5 on day 1 and gemcitabine 1000 MG/M2 on days 1 and 8 every 3 weeks. Status post 2 cycles. Starting from cycle #2 carboplatin will be reduced to AUC of 3 on day 1 and gemcitabine 800 MG/M2 on days 1 and 8 every 3 weeks secondary to intolerance to the standard doses.  INTERVAL HISTORY: Renee Vance 76 y.o. female returns for routine follow-up with her husband. The patient is due for her fourth cycle of chemotherapy today. The patient had thrombocytopenia along with a nosebleed last week and received a unit of platelets this past Saturday. She reports that her nosebleeds have stopped. She is still coughing up a very small amount of blood intermittently. She is having ongoing fatigue. She notices that she is more short of breath and has increased her oxygen to 3 L at home. She denies fevers and chills. She has mild chest discomfort but does not radiate. Denies abdominal pain, nausea, and vomiting. enies diarrhea and constipation. She was treated for cellulitis last week on her arm. This has now resolved. She is here for evaluation prior to cycle 4 day 1 of chemotherapy consisting of  carboplatin and gemcitabine. She is also here to review restaging CT scans.   Patient Active Problem List   Diagnosis Date Noted  . Port catheter in place 05/22/2017  . Pericardial effusion 05/22/2017  . Cellulitis of right forearm 05/10/2017  . Encounter for antineoplastic chemotherapy 04/26/2017  . Febrile neutropenia ([place]) 03/15/2017  . Acute renal failure superimposed on stage 4 chronic kidney disease ([place]) 03/15/2017  . Thrombocytopenia ([place]) 03/15/2017  . Hospital acquired PNA   . Chronic respiratory failure with hypoxia ([place]) 10/29/2016  . Anemia due to antineoplastic chemotherapy 09/26/2016  . Stage IV squamous cell carcinoma of left lung ([name]) 06/23/2016  . Chronic anticoagulation 08/19/2015  . PAF (paroxysmal atrial fibrillation) ([place]) 03/26/2015  . HTN (hypertension)   . Coronary atherosclerosis of native coronary artery   . Hypothyroidism   . COPD (chronic obstructive pulmonary disease) (H[facility])     is allergic to ampicillin and codeine.  MEDICAL HISTORY: Past Medical History:  Diagnosis Date  . 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 Bradycardia   . CAD (coronary artery disease)   . Cellulitis of right forearm 05/10/2017  . COPD (chronic obstructive pulmonary disease) ([place])   . Coronary atherosclerosis of native coronary artery 2011  . Encounter for antineoplastic chemotherapy 06/23/2016  . Fibrocystic breast   . History of radiation therapy 07/05/16 - 08/09/16    Left Lung: 45 Gy in 25 fractions  . HTN (hypertension)   . Hyperlipidemia   . Hypothyroidism   . Neutropenic fever ([place]) 03/15/2017  . Obesity   . Pain in  joint   . Right renal mass 11/30/2016  . Stage II squamous cell carcinoma of left lung ([place]) 06/23/2016  . Thrombocytopenia ([place]) 03/15/2017    SURGICAL HISTORY: Past Surgical History:  Procedure Laterality Date  . CHOLECYSTECTOMY    . CORONARY ANGIOPLASTY WITH STENT PLACEMENT  2011   [place] 30-40%, LAD 65-75% (FFR 0.88), CFX 55-60%,  RCA 95%>0 w/ 2.5 x 12 mm monorail stent  . IR FLUORO GUIDE PORT INSERTION RIGHT  04/07/2017  . IR US GUIDE VASC ACCESS RIGHT  04/07/2017  . TUBAL LIGATION    . VIDEO BRONCHOSCOPY Bilateral 06/07/2016   Procedure: VIDEO BRONCHOSCOPY WITH FLUORO;  Surgeon: [name], MD;  Location: [facility];  Service: Cardiopulmonary;  Laterality: Bilateral;    SOCIAL HISTORY: Social History   Social History  . Marital status: Married    Spouse name: N/A  . Number of children: N/A  . Years of education: N/A   Occupational History  . retired    Social History Main Topics  . Smoking status: Former Smoker    Packs/day: 0.50    Years: 60.00    Quit date: 04/10/2014  . Smokeless tobacco: Never Used     Comment: june 2015  . Alcohol use No  . Drug use: No  . Sexual activity: Not Currently   Other Topics Concern  . Not on file   Social History Narrative  . No narrative on file    FAMILY HISTORY: Family History  Problem Relation Age of Onset  . Heart disease Mother     Review of Systems  Constitutional: Positive for fatigue. Negative for appetite change, chills and fever.  HENT:   Positive for nosebleeds. Negative for hearing loss, lump/mass, mouth sores and sore throat.        The patient had nosebleeds prior to her platelet transfusion this past Saturday.  Eyes: Negative.   Respiratory: Positive for cough, hemoptysis and shortness of breath. Negative for wheezing.   Cardiovascular: Negative for leg swelling and palpitations.       Chest discomfort that does not radiate.  Gastrointestinal: Negative.   Endocrine: Negative.   Genitourinary: Negative.    Musculoskeletal: Negative.   Skin: Negative.   Neurological: Negative.   Hematological: Negative.   Psychiatric/Behavioral: Negative.       PHYSICAL EXAMINATION  ECOG PERFORMANCE STATUS: 1 - Symptomatic but completely ambulatory  Vitals:   05/22/17 1302  BP: (!) 155/58  Pulse: 80  Resp: (!) 21  Temp: 98.5 F (36.9 C)   SpO2: 93%    Physical Exam  Constitutional: She is oriented to person, place, and time and well-developed, well-nourished, and in no distress. No distress.  HENT:  Head: Normocephalic and atraumatic.  Mouth/Throat: Oropharynx is clear and moist. No oropharyngeal exudate.  Eyes: Conjunctivae are normal. Right eye exhibits no discharge. Left eye exhibits no discharge. No scleral icterus.  Neck: Normal range of motion.  Cardiovascular: Normal rate, regular rhythm, normal heart sounds and intact distal pulses.   Pulmonary/Chest: Effort normal. No stridor. No respiratory distress. She has no wheezes.  Diminished breath sounds to the left base.  Abdominal: Soft. Bowel sounds are normal. She exhibits no distension and no mass. There is no tenderness.  Musculoskeletal: Normal range of motion. She exhibits no edema or tenderness.  Lymphadenopathy:    She has no cervical adenopathy.  Neurological: She is alert and oriented to person, place, and time. She exhibits normal muscle tone.  Skin: Skin is warm and dry. No rash  noted. She is not diaphoretic. No erythema. No pallor.  Psychiatric: Mood, memory, affect and judgment normal.  Vitals reviewed.   LABORATORY DATA:  CBC    Component Value Date/Time   WBC 3.9 05/22/2017 1208   WBC 11.3 (H) 04/07/2017 1239   RBC 2.12 (L) 05/22/2017 1208   RBC 4.01 04/07/2017 1239   HGB 6.3 (LL) 05/22/2017 1208   HCT 18.7 (L) 05/22/2017 1208   PLT 50 (L) 05/22/2017 1208   MCV 87.9 05/22/2017 1208   MCH 29.7 05/22/2017 1208   MCH 28.2 04/07/2017 1239   MCHC 33.8 05/22/2017 1208   MCHC 32.2 04/07/2017 1239   RDW 17.0 (H) 05/22/2017 1208   LYMPHSABS 0.2 (L) 05/22/2017 1208   MONOABS 0.7 05/22/2017 1208   EOSABS 0.0 05/22/2017 1208   BASOSABS 0.0 05/22/2017 1208    CMP     Component Value Date/Time   NA 140 05/22/2017 1208   K 4.4 05/22/2017 1208   CL 103 04/07/2017 1239   CO2 29 05/22/2017 1208   GLUCOSE 120 05/22/2017 1208   BUN 28.8 (H)  05/22/2017 1208   CREATININE 1.5 (H) 05/22/2017 1208   CALCIUM 9.2 05/22/2017 1208   PROT 6.2 (L) 05/22/2017 1208   ALBUMIN 2.6 (L) 05/22/2017 1208   AST 32 05/22/2017 1208   ALT 28 05/22/2017 1208   ALKPHOS 78 05/22/2017 1208   BILITOT 0.82 05/22/2017 1208   GFRNONAA 30 (L) 04/07/2017 1239   GFRAA 35 (L) 04/07/2017 1239    RADIOGRAPHIC STUDIES:  No results found. EXAM: CT CHEST, ABDOMEN AND PELVIS WITHOUT CONTRAST  TECHNIQUE: Multidetector CT imaging of the chest, abdomen and pelvis was performed following the standard protocol without IV contrast.  COMPARISON:  Multiple exams, including chest CT 03/03/2017 and PET-CT from 01/18/2017  FINDINGS: CT CHEST FINDINGS  Cardiovascular: Right Port-A-Cath tip:  Lower SVC.  Coronary, aortic arch, and branch vessel atherosclerotic vascular disease. Hypodense blood pool favoring anemia.  Moderate pericardial effusion, increased from prior.  Mediastinum/Nodes: The upper normal sized mediastinal lymph nodes observed; a high right paratracheal node measuring 6 mm in short axis on image 16/2 previously measured 7 mm. There is some leftward shift of mediastinal structures.  Lungs/Pleura: Moderate left pleural effusion, roughly similar to the 03/03/2017 exam. Left pleural thickening and irregularity, again roughly similar to the prior exam peripheral interstitial opacity in the left mid lung with more consolidative airspace opacity at the left lung base, not appreciably changed from 03/03/2017. There is some passive atelectasis on the left along with scattered air bronchograms.  Peripheral interstitial accentuation in the right lung with paraseptal emphysema. Some of the peripheral ground-glass opacities in the right lung shown on 03/03/2017 appear mildly improved but overall the coarse peripheral interstitial accentuation suggests fibrosis superimposed on emphysema.  Musculoskeletal: Mild thoracic spondylosis.  Degenerative sternoclavicular arthropathy bilaterally.  CT ABDOMEN PELVIS FINDINGS  Hepatobiliary: Cholecystectomy.  Otherwise unremarkable.  Pancreas: Unremarkable  Spleen: Unremarkable  Adrenals/Urinary Tract: Mass of the lateral limb left adrenal gland measures 1.3 by 2.5 cm and previously measured 0.9 by 1.4 cm on 01/18/2017. This mass previously was highly hypermetabolic, compatible with a metastatic lesion.  On image 71 of series 2 there is a 2.4 by 1.7 cm very faintly hypodense lesion along the medial aspect of the right kidney lower pole. Given a comprehensive review of all prior abdominal imaging, this could be a small renal cell carcinoma. It is been highly inconspicuous on PET-CT exams due to the normal high metabolic activity of the  kidneys, and on the noncontrast CT does somewhat resemble a cyst. However, there is concern for some faint heterogeneity of enhancement on a remote exam from 2014 in this vicinity, and this lesion needs to be further worked up.  Stomach/Bowel: Borderline wall thickening in the duodenum and proximal jejunum. There is questionable wall thickening in the sigmoid colon.  Vascular/Lymphatic: Aortoiliac atherosclerotic vascular disease. No pathologic adenopathy in the abdomen is observed.  Reproductive: Unremarkable  Other: No supplemental non-categorized findings.  Musculoskeletal: Calcific tendinopathy in the proximal hamstring tendons. Degenerative loss of articular space in both hips. Mild levoconvex lumbar scoliosis. Grade 1 degenerative retrolisthesis at L1- 2 and L2-3. Anterolisthesis at L4-5 and 3 mm retrolisthesis at L5-S1. There is likely mild to moderate central narrowing of the thecal sac at L4-5. No pars defects are observed.  IMPRESSION: 1. Enlarging, previously hypermetabolic mass of the lateral limb left adrenal gland favoring metastatic disease. 2. Abnormal hypodense lesion medially along the right kidney  lower pole concerning for a renal cell carcinoma. This lesion has been difficult to characterize on multiple prior exams due to the lack of contrast due to the patient's renal insufficiency, and the normal high activity in the renal parenchyma on PET scans which tends to obscure renal lesions. However, back in 2014 there was very subtle heterogeneity of enhancement at this location and the underlying lesion now seems larger. Consider renal protocol MRI using half dose Magnevist contrast agent due to the patient's renal insufficiency in order to further clarify ; normally we try to avoid gadolinium contrast in patients with GFR under 30 due to the risk of NSF, but this patient's GFR has hovered around 30 ml/min/1.39m^2, and the benefits may outweigh the risk in this case. 3. Stable lung fibrosis and regions of airspace opacity in the left lung associated with left hemithoracic volume loss. 4. Low-density blood pool suggests anemia. 5.  Aortic Atherosclerosis (ICD10-I70.0).  Coronary atherosclerosis. 6. Moderate pericardial effusion, increased from prior. 7. Stable moderate left pleural effusion and left pleural thickening. 8. Emphysema (ICD10-J43.9). Probable mild-to-moderate central narrowing of the thecal sac L4-5 due to degenerative subluxation and facet arthropathy. 9. Borderline wall thickening in the duodenum, proximal jejunum, and sigmoid colon.   Electronically Signed   By: Van Clines M.D.   On: 05/18/2017 15:10   ASSESSMENT and THERAPY PLAN:   Stage IV squamous cell carcinoma of left lung (HCC) This is a very pleasant 76 year old white female with metastatic non-small cell lung cancer that was initially diagnosed as unresectable a stage IIB squamous cell carcinoma status post course of concurrent chemoradiation with weekly carboplatin and paclitaxel. She was unable to receive consolidation chemotherapy at that time because of poor performance status and complete  location from the previous treatment with radiation. The patient had evidence for metastatic disease based on biopsy of the left adrenal gland with PDL 1 expression of 0% The patient was started on treatment with systemic chemotherapy with reduced dose carboplatin and gemcitabine status post 3 cycles. She tolerated the third cycle, but continues to have significant pancytopenia especially anemia and thrombocytopenia with her treatment.  Patient was seen with Dr. Julien Nordmann. CT scan results were discussed with the patient and her husband. The patient is noted to have an enlarging mass in the left adrenal gland. Recommend that we stop chemotherapy at this time and refer her to radiation oncology for consideration of radiation to this area. Referral has been made to Dr. Sondra Come.   We will plan to see  her back in medical oncology in approximately 3 months with a restaging CT scan prior to that visit.  Thrombocytopenia ([place]) The patient has thrombocytopenia due to her chemotherapy. She received a unit of platelets this past weekend with improvement of her platelet count to 50,000. She has no active bleeding. We anticipate that her counts will recover since her chemotherapy is being stopped at this time.  Anemia due to antineoplastic chemotherapy The patient is anemic due to her chemotherapy. She is asymptomatic today. The patient will receive 2 units of packed red blood cells here in our office.  Pericardial effusion The patient has a pericardial effusion noted on her recent CT scan. Referral has been made to cardiology for further evaluation.   The patient was instructed to call us if she develops any worsening of her shortness of breath, increase in hemoptysis, cough, or increased bleeding.  Orders Placed This Encounter  Procedures  . CT Abdomen Pelvis W Contrast    Standing Status:   Future    Standing Expiration Date:   05/22/2018    Order Specific Question:   If indicated for the ordered  procedure, I authorize the administration of contrast media per Radiology protocol    Answer:   Yes    Order Specific Question:   Preferred imaging location?    Answer:   [facility]    Order Specific Question:   Radiology Contrast Protocol - do NOT remove file path    Answer:   \\[email or web address]    Order Specific Question:   Reason for Exam additional comments    Answer:   Restaging metastatic lung cancer  . CT Chest W Contrast    Standing Status:   Future    Standing Expiration Date:   05/22/2018    Order Specific Question:   If indicated for the ordered procedure, I authorize the administration of contrast media per Radiology protocol    Answer:   Yes    Order Specific Question:   Preferred imaging location?    Answer:   [facility]    Order Specific Question:   Radiology Contrast Protocol - do NOT remove file path    Answer:   [email or web address]    Order Specific Question:   Reason for Exam additional comments    Answer:   Restaging. Metastatic lung cancer.  Marland Kitchen CBC with Differential/Platelet    Standing Status:   Future    Standing Expiration Date:   05/22/2018  . Comprehensive metabolic panel    Standing Status:   Future    Standing Expiration Date:   05/22/2018  . Ambulatory referral to Radiation Oncology    Referral Priority:   Routine    Referral Type:   Consultation    Referral Reason:   Specialty Services Required    Referred to Provider:   Gery Pray, MD    Requested Specialty:   Radiation Oncology    Number of Visits Requested:   1  . Ambulatory referral to Cardiology    Referral Priority:   Routine    Referral Type:   Consultation    Referral Reason:   Specialty Services Required    Referred to Provider:   Belva Crome, MD    Requested Specialty:   Cardiology    Number of Visits Requested:   1  . Practitioner attestation of consent    I, the ordering practitioner, attest that I have discussed  with the patient the benefits, risks, side effects, alternatives, likelihood of  achieving goals and potential problems during recovery for the procedure listed.    Standing Status:   Future    Standing Expiration Date:   05/22/2018    Order Specific Question:   Procedure    Answer:   Blood Product(s)  . Complete patient signature process for consent form    Standing Status:   Future    Standing Expiration Date:   05/22/2018  . Care order/instruction    Transfuse Parameters    Standing Status:   Future    Standing Expiration Date:   05/22/2018  . Type and screen    Standing Status:   Future    Number of Occurrences:   1    Standing Expiration Date:   05/22/2018    All questions were answered. The patient knows to call the clinic with any problems, questions or concerns. We can certainly see the patient much sooner if necessary.  Mikey Bussing, NP 05/22/2017   ADDENDUM: Hematology/Oncology Attending: I had a face to face encounter with the patient. I recommended her care plan. The patient is a very pleasant 76 years old white female with metastatic non-small cell lung cancer, squamous cell carcinoma that was initially diagnosed as a stage IIa in August 2017 status post concurrent chemoradiation. She was unable to receive consolidation chemotherapy because of the poor performance status after her concurrent chemoradiation. The patient developed metastatic disease to the left adrenal gland and she was started on treatment with systemic chemotherapy with reduced dose carboplatin and gemcitabine. She has rough time with this treatment. Repeat CT scan of the chest performed recently showed further progression of her disease in the left adrenal gland but stable disease in the chest. The patient also has significant chemotherapy-induced anemia. I will arrange for the patient to receive 2 units of PRBCs transfusion. Regarding the progressive lung cancer, I recommended for the patient to see Dr. Sondra Come  for consideration of palliative radiotherapy to the enlarging left adrenal gland lesion. I would keep her on observation for now with repeat CT scan of the chest, abdomen and pelvis in 3 months for restaging of her disease. She was advised to call immediately if she has any concerning symptoms in the interval. Disclaimer: This note was dictated with voice recognition software. Similar sounding words can inadvertently be transcribed and may be missed upon review. [name]., MD 05/23/17

## 2017-05-22 NOTE — Assessment & Plan Note (Signed)
The patient has a pericardial effusion noted on her recent CT scan. Referral has been made to cardiology for further evaluation.

## 2017-05-23 ENCOUNTER — Encounter: Payer: Self-pay | Admitting: Radiation Oncology

## 2017-05-23 ENCOUNTER — Telehealth: Payer: Self-pay | Admitting: Internal Medicine

## 2017-05-23 ENCOUNTER — Other Ambulatory Visit: Payer: PPO

## 2017-05-23 ENCOUNTER — Ambulatory Visit: Payer: PPO

## 2017-05-23 ENCOUNTER — Ambulatory Visit: Payer: PPO | Admitting: Oncology

## 2017-05-23 ENCOUNTER — Ambulatory Visit: Payer: PPO | Admitting: Internal Medicine

## 2017-05-23 LAB — TYPE AND SCREEN
ABO/RH(D): B NEG
Antibody Screen: NEGATIVE
UNIT DIVISION: 0
Unit division: 0

## 2017-05-23 LAB — BPAM RBC
BLOOD PRODUCT EXPIRATION DATE: 201808292359
Blood Product Expiration Date: 201808282359
ISSUE DATE / TIME: 201808131436
ISSUE DATE / TIME: 201808131436
UNIT TYPE AND RH: 1700
Unit Type and Rh: 1700

## 2017-05-23 NOTE — Telephone Encounter (Signed)
Called patient regarding appointments in November also confirmed appointment with Dr.Smith.  For October 18th Patient refused to see anyone else.

## 2017-05-24 ENCOUNTER — Telehealth: Payer: Self-pay | Admitting: Medical Oncology

## 2017-05-24 NOTE — Telephone Encounter (Signed)
Can she get cataract surgery? Note to Renee Vance.

## 2017-05-24 NOTE — Progress Notes (Signed)
Histology and Location of Primary Cancer: Stage IV squamous cell carcinoma of left lung   Location(s) of Symptomatic tumor(s): enlarging mass in the left adrenal gland  Past/Anticipated chemotherapy by medical oncology, if any: Systemic chemotherapy with carboplatin for AUC of 5 on day 1 and gemcitabine 1000 MG/M2 on days 1 and 8 every 3 weeks. S/P 3 cycles.  Stopped due to left adrenal gland mass.  Pain on a scale of 0-10 is: 0  SAFETY ISSUES:  Prior radiation? 07/05/16 - 08/09/16  Left Lung: 45 Gy in 25 fractions  Pacemaker/ICD? no  Possible current pregnancy? no  Is the patient on methotrexate? no  Additional Complaints / other details:  Patient reports having worsening shortness of breath.  She is currently using 2L of oxygen.  She also reports having a poor energy level.  BP (!) 150/64 (BP Location: Left Arm, Patient Position: Sitting)   Pulse (!) 103   Temp 98.3 F (36.8 C) (Oral)   Ht 5\' 4"  (1.626 m)   Wt 147 lb 6.4 oz (66.9 kg)   SpO2 94% Comment: 2L  BMI 25.30 kg/m    Wt Readings from Last 3 Encounters:  06/01/17 147 lb 6.4 oz (66.9 kg)  05/22/17 153 lb 11.2 oz (69.7 kg)  04/26/17 145 lb 6.4 oz (66 kg)

## 2017-05-29 ENCOUNTER — Telehealth: Payer: Self-pay | Admitting: Medical Oncology

## 2017-05-29 NOTE — Telephone Encounter (Signed)
Pt notified ok for cataract surgery. .She reports worsening sob when she gets up to walk . She is fine when she is sitting.Denies dizziness and lightheadedness. She sounds out of breath on the phone.

## 2017-05-29 NOTE — Telephone Encounter (Signed)
Pt notified to f/u with pulmonary for her copd .

## 2017-05-29 NOTE — Telephone Encounter (Signed)
No follow up with Cardiology and pulmonary for COPD

## 2017-05-29 NOTE — Telephone Encounter (Signed)
-----   Message from [name], MD sent at 05/24/2017  5:48 PM EDT ----- Regarding: RE: ok for cataract surgery? Yes. ----- Message ----- From: Ardeen Garland, RN Sent: 05/24/2017   3:13 PM To: [name], MD Subject: ok for cataract surgery?                       Now that she is of chemo can she get cataract surgery?

## 2017-05-30 ENCOUNTER — Other Ambulatory Visit: Payer: PPO

## 2017-05-30 ENCOUNTER — Ambulatory Visit: Payer: PPO

## 2017-06-01 ENCOUNTER — Ambulatory Visit
Admission: RE | Admit: 2017-06-01 | Discharge: 2017-06-01 | Disposition: A | Discharge status: Home / Self Care | Payer: PPO | Source: Ambulatory Visit | Attending: Radiation Oncology | Admitting: Radiation Oncology

## 2017-06-01 ENCOUNTER — Encounter: Payer: Self-pay | Admitting: Radiation Oncology

## 2017-06-01 DIAGNOSIS — Z9981 Dependence on supplemental oxygen: Secondary | ICD-10-CM | POA: Diagnosis not present

## 2017-06-01 DIAGNOSIS — C3412 Malignant neoplasm of upper lobe, left bronchus or lung: Secondary | ICD-10-CM | POA: Diagnosis not present

## 2017-06-01 DIAGNOSIS — J90 Pleural effusion, not elsewhere classified: Secondary | ICD-10-CM | POA: Insufficient documentation

## 2017-06-01 DIAGNOSIS — G8929 Other chronic pain: Secondary | ICD-10-CM | POA: Insufficient documentation

## 2017-06-01 DIAGNOSIS — E279 Disorder of adrenal gland, unspecified: Secondary | ICD-10-CM | POA: Diagnosis not present

## 2017-06-01 DIAGNOSIS — Z79899 Other long term (current) drug therapy: Secondary | ICD-10-CM | POA: Diagnosis not present

## 2017-06-01 DIAGNOSIS — Z7982 Long term (current) use of aspirin: Secondary | ICD-10-CM | POA: Diagnosis not present

## 2017-06-01 DIAGNOSIS — N289 Disorder of kidney and ureter, unspecified: Secondary | ICD-10-CM | POA: Insufficient documentation

## 2017-06-01 DIAGNOSIS — C3492 Malignant neoplasm of unspecified part of left bronchus or lung: Secondary | ICD-10-CM | POA: Diagnosis not present

## 2017-06-01 DIAGNOSIS — Z880 Allergy status to penicillin: Secondary | ICD-10-CM | POA: Insufficient documentation

## 2017-06-01 DIAGNOSIS — R0602 Shortness of breath: Secondary | ICD-10-CM | POA: Diagnosis not present

## 2017-06-01 DIAGNOSIS — Z885 Allergy status to narcotic agent status: Secondary | ICD-10-CM | POA: Diagnosis not present

## 2017-06-01 DIAGNOSIS — J8410 Pulmonary fibrosis, unspecified: Secondary | ICD-10-CM | POA: Insufficient documentation

## 2017-06-01 DIAGNOSIS — Z510 Encounter for antineoplastic radiation therapy: Secondary | ICD-10-CM | POA: Insufficient documentation

## 2017-06-01 NOTE — Progress Notes (Signed)
Please see the Nurse Progress Note in the MD Initial Consult Encounter for this patient. 

## 2017-06-01 NOTE — Progress Notes (Signed)
Radiation Oncology         [phone] ________________________________  Name: Renee Vance MRN: [id number]  Date: 06/01/2017  DOB: Dec 23, 1940    Re-Consult Visit Note  CC: [name], MD  [name], MD    ICD-10-CM   1. Stage IV squamous cell carcinoma of left lung [facility]) C34.92     Diagnosis:  Stage IIB (T2a, N1, M0) non-small cell lung cancer, squamous cell carcinoma, presented with left hilar mass, now with an enlarging left adrenal mass (oligometastasis).  Interval Since Last Radiation:  10 months  07/05/16-08/09/16 45 Gy in 25 fractions to the left lung  Narrative:  The patient returns today for re-consult and discussion of enlarging mass in the left adrenal gland. The patient was previously on systemic chemotherapy with carboplatin for AUC of 5 on day 1 and gemcitabine 1000 mg/m2 on days 1 and 8 every 3 weeks s/p 3 cycles, which was stopped due to enlarging left adrenal gland mass. Patient was last seen by medical oncology on 05/22/2017. She denies any pain at this time. She reports having worsening shortness of breath. She is currently using 2L of oxygen. She also reports having a poor energy level. She is able to raise both arms above her head, though she does note some discomfort in her neck. She denies hematuria. She does not have a pacemaker. She reports occasional nausea but, isn't sure if it is because she is hungry or not. She denies vomiting. She reports chronic back pain, localized to the lumbar spine area. She denies any leg weakness or numbness. She questions if it would be okay to have cataract surgery while undergoing radiation therapy.   Recent CT C/A/P on 05/18/2017 showed enlarging, previously hypermetabolic mass of the lateral limb left adrenal gland favoring metastatic disease. Abnormal hypodense lesion medially along the right kidney lower pole concerning for renal cell carcinoma. This lesion has been difficult to characterize on multiple prior  exams due to the lack of contrast due to the patient's renal insufficiency, and the normal high activity in the renal parenchyma on PET scans which tends to obscure renal lesions. However, back in 2014 there was very subtle heterogeneity of enhancement at this location and the underlying lesion now seems larger. Consider renal protocol MRI using half dose Magnevist contrast agent due to patient's renal insufficiency in order to further clarify. Stable lung fibrosis and regions of airspace opacity in the left lung associated with left hemithoracic volume loss. Stable moderate left pleural effusion and left pleural thickening. Borderline wall thickening in the duodenum, proximal jejunum, and sigmoid colon.  ALLERGIES:  is allergic to ampicillin and codeine.  Meds: Current Outpatient Prescriptions  Medication Sig Dispense Refill  . acetaminophen (TYLENOL) 325 MG tablet Take 650 mg by mouth every 6 (six) hours as needed.    Marland Kitchen amLODipine (NORVASC) 5 MG tablet Take 5 mg by mouth daily.    Marland Kitchen aspirin 81 MG tablet Take 81 mg by mouth daily.    Marland Kitchen atorvastatin (LIPITOR) 80 MG tablet Take 80 mg by mouth daily.    . budesonide (PULMICORT) 0.5 MG/2ML nebulizer solution Take 0.5 mg by nebulization 2 (two) times daily.     . feeding supplement (BOOST HIGH PROTEIN) LIQD Take 1 Container by mouth daily.    Marland Kitchen ipratropium-albuterol (DUONEB) 0.5-2.5 (3) MG/3ML SOLN Take 3 mLs by nebulization every 6 (six) hours as needed. 360 mL 0  . levothyroxine (SYNTHROID, LEVOTHROID) 125 MCG tablet Take 125 mcg by mouth daily  before breakfast.     . lidocaine-prilocaine (EMLA) cream Apply 1 application topically as needed. 30 g 0  . metoprolol succinate (TOPROL-XL) 50 MG 24 hr tablet TAKE ONE TABLET BY MOUTH TWICE DAILY 180 tablet 3  . Multiple Vitamins-Minerals (MULTIVITAMIN WITH MINERALS) tablet Take 1 tablet by mouth daily.    . OXYGEN Inhale 2 L into the lungs continuous.    . Vitamin D, Cholecalciferol, 1000 UNITS CAPS Take  1,000 Units by mouth daily.     Marland Kitchen omeprazole (PRILOSEC) 20 MG capsule Take 1 capsule (20 mg total) by mouth daily. (Patient not taking: Reported on 06/01/2017) 30 capsule 2   No current facility-administered medications for this encounter.    Facility-Administered Medications Ordered in Other Encounters  Medication Dose Route Frequency Provider Last Rate Last Dose  . 0.9 %  sodium chloride infusion  250 mL Intravenous Once Curcio, Erasmo Downer R, NP      . heparin lock flush 100 unit/mL  500 Units Intracatheter Daily PRN Curcio, Roselie Awkward, NP      . sodium chloride flush (NS) 0.9 % injection 10 mL  10 mL Intracatheter PRN Maryanna Shape, NP        Physical Findings:  height is 5\' 4"  (1.626 m) and weight is 147 lb 6.4 oz (66.9 kg). Her oral temperature is 98.3 F (36.8 C). Her blood pressure is 150/64 (abnormal) and her pulse is 103 (abnormal). Her oxygen saturation is 94%. . Patient is alert, oriented, and in no acute distress. Nasal cannula in place at 2L. Lungs are clear to auscultation bilaterally. Port-a-cath in place. Heart has regular rate and rhythm. No palpable cervical, supraclavicular, or axillary adenopathy. Abdomen soft, non-tender, normal bowel sounds. Palpable flank mass or flank pain.   Lab Findings: Lab Results  Component Value Date   WBC 3.9 05/22/2017   HGB 6.3 (LL) 05/22/2017   HCT 18.7 (L) 05/22/2017   MCV 87.9 05/22/2017   PLT 50 (L) 05/22/2017    Radiographic Findings: Ct Abdomen Pelvis Wo Contrast  Result Date: 05/18/2017 CLINICAL DATA:  Squamous cell carcinoma of the left lung. Chemotherapy. COPD. EXAM: CT CHEST, ABDOMEN AND PELVIS WITHOUT CONTRAST TECHNIQUE: Multidetector CT imaging of the chest, abdomen and pelvis was performed following the standard protocol without IV contrast. COMPARISON:  Multiple exams, including chest CT 03/03/2017 and PET-CT from 01/18/2017 FINDINGS: CT CHEST FINDINGS Cardiovascular: Right Port-A-Cath tip:  Lower SVC. Coronary, aortic arch,  and branch vessel atherosclerotic vascular disease. Hypodense blood pool favoring anemia. Moderate pericardial effusion, increased from prior. Mediastinum/Nodes: The upper normal sized mediastinal lymph nodes observed; a high right paratracheal node measuring 6 mm in short axis on image 16/2 previously measured 7 mm. There is some leftward shift of mediastinal structures. Lungs/Pleura: Moderate left pleural effusion, roughly similar to the 03/03/2017 exam. Left pleural thickening and irregularity, again roughly similar to the prior exam peripheral interstitial opacity in the left mid lung with more consolidative airspace opacity at the left lung base, not appreciably changed from 03/03/2017. There is some passive atelectasis on the left along with scattered air bronchograms. Peripheral interstitial accentuation in the right lung with paraseptal emphysema. Some of the peripheral ground-glass opacities in the right lung shown on 03/03/2017 appear mildly improved but overall the coarse peripheral interstitial accentuation suggests fibrosis superimposed on emphysema. Musculoskeletal: Mild thoracic spondylosis. Degenerative sternoclavicular arthropathy bilaterally. CT ABDOMEN PELVIS FINDINGS Hepatobiliary: Cholecystectomy.  Otherwise unremarkable. Pancreas: Unremarkable Spleen: Unremarkable Adrenals/Urinary Tract: Mass of the lateral limb left adrenal gland measures 1.3  by 2.5 cm and previously measured 0.9 by 1.4 cm on 01/18/2017. This mass previously was highly hypermetabolic, compatible with a metastatic lesion. On image 71 of series 2 there is a 2.4 by 1.7 cm very faintly hypodense lesion along the medial aspect of the right kidney lower pole. Given a comprehensive review of all prior abdominal imaging, this could be a small renal cell carcinoma. It is been highly inconspicuous on PET-CT exams due to the normal high metabolic activity of the kidneys, and on the noncontrast CT does somewhat resemble a cyst. However,  there is concern for some faint heterogeneity of enhancement on a remote exam from 2014 in this vicinity, and this lesion needs to be further worked up. Stomach/Bowel: Borderline wall thickening in the duodenum and proximal jejunum. There is questionable wall thickening in the sigmoid colon. Vascular/Lymphatic: Aortoiliac atherosclerotic vascular disease. No pathologic adenopathy in the abdomen is observed. Reproductive: Unremarkable Other: No supplemental non-categorized findings. Musculoskeletal: Calcific tendinopathy in the proximal hamstring tendons. Degenerative loss of articular space in both hips. Mild levoconvex lumbar scoliosis. Grade 1 degenerative retrolisthesis at L1- 2 and L2-3. Anterolisthesis at L4-5 and 3 mm retrolisthesis at L5-S1. There is likely mild to moderate central narrowing of the thecal sac at L4-5. No pars defects are observed. IMPRESSION: 1. Enlarging, previously hypermetabolic mass of the lateral limb left adrenal gland favoring metastatic disease. 2. Abnormal hypodense lesion medially along the right kidney lower pole concerning for a renal cell carcinoma. This lesion has been difficult to characterize on multiple prior exams due to the lack of contrast due to the patient's renal insufficiency, and the normal high activity in the renal parenchyma on PET scans which tends to obscure renal lesions. However, back in 2014 there was very subtle heterogeneity of enhancement at this location and the underlying lesion now seems larger. Consider renal protocol MRI using half dose Magnevist contrast agent due to the patient's renal insufficiency in order to further clarify ; normally we try to avoid gadolinium contrast in patients with GFR under 30 due to the risk of NSF, but this patient's GFR has hovered around 30 ml/min/1.53m^2, and the benefits may outweigh the risk in this case. 3. Stable lung fibrosis and regions of airspace opacity in the left lung associated with left hemithoracic volume  loss. 4. Low-density blood pool suggests anemia. 5.  Aortic Atherosclerosis (ICD10-I70.0).  Coronary atherosclerosis. 6. Moderate pericardial effusion, increased from prior. 7. Stable moderate left pleural effusion and left pleural thickening. 8. Emphysema (ICD10-J43.9). Probable mild-to-moderate central narrowing of the thecal sac L4-5 due to degenerative subluxation and facet arthropathy. 9. Borderline wall thickening in the duodenum, proximal jejunum, and sigmoid colon. Electronically Signed   By: Van Clines M.D.   On: 05/18/2017 15:10   Ct Chest Wo Contrast  Result Date: 05/18/2017 CLINICAL DATA:  Squamous cell carcinoma of the left lung. Chemotherapy. COPD. EXAM: CT CHEST, ABDOMEN AND PELVIS WITHOUT CONTRAST TECHNIQUE: Multidetector CT imaging of the chest, abdomen and pelvis was performed following the standard protocol without IV contrast. COMPARISON:  Multiple exams, including chest CT 03/03/2017 and PET-CT from 01/18/2017 FINDINGS: CT CHEST FINDINGS Cardiovascular: Right Port-A-Cath tip:  Lower SVC. Coronary, aortic arch, and branch vessel atherosclerotic vascular disease. Hypodense blood pool favoring anemia. Moderate pericardial effusion, increased from prior. Mediastinum/Nodes: The upper normal sized mediastinal lymph nodes observed; a high right paratracheal node measuring 6 mm in short axis on image 16/2 previously measured 7 mm. There is some leftward shift of mediastinal structures. Lungs/Pleura: Moderate  left pleural effusion, roughly similar to the 03/03/2017 exam. Left pleural thickening and irregularity, again roughly similar to the prior exam peripheral interstitial opacity in the left mid lung with more consolidative airspace opacity at the left lung base, not appreciably changed from 03/03/2017. There is some passive atelectasis on the left along with scattered air bronchograms. Peripheral interstitial accentuation in the right lung with paraseptal emphysema. Some of the peripheral  ground-glass opacities in the right lung shown on 03/03/2017 appear mildly improved but overall the coarse peripheral interstitial accentuation suggests fibrosis superimposed on emphysema. Musculoskeletal: Mild thoracic spondylosis. Degenerative sternoclavicular arthropathy bilaterally. CT ABDOMEN PELVIS FINDINGS Hepatobiliary: Cholecystectomy.  Otherwise unremarkable. Pancreas: Unremarkable Spleen: Unremarkable Adrenals/Urinary Tract: Mass of the lateral limb left adrenal gland measures 1.3 by 2.5 cm and previously measured 0.9 by 1.4 cm on 01/18/2017. This mass previously was highly hypermetabolic, compatible with a metastatic lesion. On image 71 of series 2 there is a 2.4 by 1.7 cm very faintly hypodense lesion along the medial aspect of the right kidney lower pole. Given a comprehensive review of all prior abdominal imaging, this could be a small renal cell carcinoma. It is been highly inconspicuous on PET-CT exams due to the normal high metabolic activity of the kidneys, and on the noncontrast CT does somewhat resemble a cyst. However, there is concern for some faint heterogeneity of enhancement on a remote exam from 2014 in this vicinity, and this lesion needs to be further worked up. Stomach/Bowel: Borderline wall thickening in the duodenum and proximal jejunum. There is questionable wall thickening in the sigmoid colon. Vascular/Lymphatic: Aortoiliac atherosclerotic vascular disease. No pathologic adenopathy in the abdomen is observed. Reproductive: Unremarkable Other: No supplemental non-categorized findings. Musculoskeletal: Calcific tendinopathy in the proximal hamstring tendons. Degenerative loss of articular space in both hips. Mild levoconvex lumbar scoliosis. Grade 1 degenerative retrolisthesis at L1- 2 and L2-3. Anterolisthesis at L4-5 and 3 mm retrolisthesis at L5-S1. There is likely mild to moderate central narrowing of the thecal sac at L4-5. No pars defects are observed. IMPRESSION: 1. Enlarging,  previously hypermetabolic mass of the lateral limb left adrenal gland favoring metastatic disease. 2. Abnormal hypodense lesion medially along the right kidney lower pole concerning for a renal cell carcinoma. This lesion has been difficult to characterize on multiple prior exams due to the lack of contrast due to the patient's renal insufficiency, and the normal high activity in the renal parenchyma on PET scans which tends to obscure renal lesions. However, back in 2014 there was very subtle heterogeneity of enhancement at this location and the underlying lesion now seems larger. Consider renal protocol MRI using half dose Magnevist contrast agent due to the patient's renal insufficiency in order to further clarify ; normally we try to avoid gadolinium contrast in patients with GFR under 30 due to the risk of NSF, but this patient's GFR has hovered around 30 ml/min/1.45m^2, and the benefits may outweigh the risk in this case. 3. Stable lung fibrosis and regions of airspace opacity in the left lung associated with left hemithoracic volume loss. 4. Low-density blood pool suggests anemia. 5.  Aortic Atherosclerosis (ICD10-I70.0).  Coronary atherosclerosis. 6. Moderate pericardial effusion, increased from prior. 7. Stable moderate left pleural effusion and left pleural thickening. 8. Emphysema (ICD10-J43.9). Probable mild-to-moderate central narrowing of the thecal sac L4-5 due to degenerative subluxation and facet arthropathy. 9. Borderline wall thickening in the duodenum, proximal jejunum, and sigmoid colon. Electronically Signed   By: Van Clines M.D.   On: 05/18/2017 15:10  Impression: Stage IIB (T2a, N1, M0) non-small cell lung cancer, squamous cell carcinoma, presented with left hilar mass, now with an enlarging left adrenal mass (oligometastasis).  Today, I talked to the patient about the findings and work-up thus far.  We discussed the natural history of metastatic non-small cell lung cancer and  general treatment, highlighting the role of stereotactic body radiotherapy in the management. We discussed the available radiation techniques, and focused on the details of logistics and delivery. We reviewed the anticipated acute and late sequelae associated with radiation in this setting. The patient was encouraged to ask questions that I answered to the best of my ability. The patient would like to proceed with radiation and will be scheduled for SBRT CT simulation. Depending on the set up, she will either proceed with SBRT or conventional radiotherapy (10 treatments).  Plan: The patient will proceed with SBRT CT simulation and planning appointment next Tuesday, 06/06/17 at 1 pm.   -----------------------------------  Blair Promise, PhD, MD  This document serves as a record of services personally performed by Gery Pray, MD. It was created on his behalf by Arlyce Harman, a trained medical scribe. The creation of this record is based on the scribe's personal observations and the provider's statements to them. This document has been checked and approved by the attending provider.

## 2017-06-04 DIAGNOSIS — J449 Chronic obstructive pulmonary disease, unspecified: Secondary | ICD-10-CM | POA: Diagnosis not present

## 2017-06-04 DIAGNOSIS — C3492 Malignant neoplasm of unspecified part of left bronchus or lung: Secondary | ICD-10-CM | POA: Diagnosis not present

## 2017-06-06 ENCOUNTER — Ambulatory Visit
Admission: RE | Admit: 2017-06-06 | Discharge: 2017-06-06 | Disposition: A | Discharge status: Home / Self Care | Payer: PPO | Source: Ambulatory Visit | Attending: Radiation Oncology | Admitting: Radiation Oncology

## 2017-06-06 DIAGNOSIS — Z510 Encounter for antineoplastic radiation therapy: Secondary | ICD-10-CM | POA: Diagnosis not present

## 2017-06-06 DIAGNOSIS — C3492 Malignant neoplasm of unspecified part of left bronchus or lung: Secondary | ICD-10-CM

## 2017-06-06 DIAGNOSIS — C3412 Malignant neoplasm of upper lobe, left bronchus or lung: Secondary | ICD-10-CM | POA: Diagnosis not present

## 2017-06-06 DIAGNOSIS — J449 Chronic obstructive pulmonary disease, unspecified: Secondary | ICD-10-CM | POA: Diagnosis not present

## 2017-06-06 NOTE — Progress Notes (Signed)
  Radiation Oncology         [phone] ________________________________  Name: Renee Vance MRN: [id number]  Date: 06/06/2017  DOB: [birth year]   STEREOTACTIC BODY RADIOTHERAPY SIMULATION AND TREATMENT PLANNING NOTE    DIAGNOSIS: Stage IIB(T2a, N1, M0) non-small cell lung cancer, squamous cell carcinoma,presented with left hilar mass, now with an enlarging left adrenal mass (oligometastasis).   NARRATIVE:  The patient was brought to the [facility].  Identity was confirmed.  All relevant records and images related to the planned course of therapy were reviewed.  The patient freely provided informed written consent to proceed with treatment after reviewing the details related to the planned course of therapy. The consent form was witnessed and verified by the simulation staff.  Then, the patient was set-up in a stable reproducible  supine position for radiation therapy.  A BodyFix immobilization pillow was fabricated for reproducible positioning.  Then I personally applied the abdominal compression paddle to limit respiratory excursion.  4D respiratoy motion management CT images were obtained.  Surface markings were placed.  The CT images were loaded into the planning software.  Then, using Cine, MIP, and standard views, the internal target volume (ITV) and planning target volumes (PTV) were delinieated, and avoidance structures were contoured.  Treatment planning then occurred.  The radiation prescription was entered and confirmed.  A total of two complex treatment devices were fabricated in the form of the BodyFix immobilization pillow and a neck accuform cushion.  I have requested : 3D Simulation  I have requested a DVH of the following structures: Heart, Lungs, Esophagus, Chest Wall, Brachial Plexus, Major Blood Vessels, and targets.  PLAN:  The patient will receive ~45 Gy in 5 fractions depending on tolerance of critical structures in the immediate vicinity of the  high-dose area.  -----------------------------------  Blair Promise, PhD, MD

## 2017-06-07 DIAGNOSIS — M546 Pain in thoracic spine: Secondary | ICD-10-CM | POA: Diagnosis not present

## 2017-06-07 DIAGNOSIS — M47812 Spondylosis without myelopathy or radiculopathy, cervical region: Secondary | ICD-10-CM | POA: Diagnosis not present

## 2017-06-07 DIAGNOSIS — M9901 Segmental and somatic dysfunction of cervical region: Secondary | ICD-10-CM | POA: Diagnosis not present

## 2017-06-07 DIAGNOSIS — M9903 Segmental and somatic dysfunction of lumbar region: Secondary | ICD-10-CM | POA: Diagnosis not present

## 2017-06-07 DIAGNOSIS — M47816 Spondylosis without myelopathy or radiculopathy, lumbar region: Secondary | ICD-10-CM | POA: Diagnosis not present

## 2017-06-07 DIAGNOSIS — S134XXA Sprain of ligaments of cervical spine, initial encounter: Secondary | ICD-10-CM | POA: Diagnosis not present

## 2017-06-07 DIAGNOSIS — M9902 Segmental and somatic dysfunction of thoracic region: Secondary | ICD-10-CM | POA: Diagnosis not present

## 2017-06-08 DIAGNOSIS — Z510 Encounter for antineoplastic radiation therapy: Secondary | ICD-10-CM | POA: Diagnosis not present

## 2017-06-08 DIAGNOSIS — C3412 Malignant neoplasm of upper lobe, left bronchus or lung: Secondary | ICD-10-CM | POA: Diagnosis not present

## 2017-06-09 ENCOUNTER — Other Ambulatory Visit (INDEPENDENT_AMBULATORY_CARE_PROVIDER_SITE_OTHER): Payer: PPO

## 2017-06-09 ENCOUNTER — Encounter: Payer: Self-pay | Admitting: Internal Medicine

## 2017-06-09 ENCOUNTER — Ambulatory Visit (INDEPENDENT_AMBULATORY_CARE_PROVIDER_SITE_OTHER)
Admission: RE | Admit: 2017-06-09 | Discharge: 2017-06-09 | Disposition: A | Discharge status: Home / Self Care | Payer: PPO | Source: Ambulatory Visit | Attending: Internal Medicine | Admitting: Internal Medicine

## 2017-06-09 ENCOUNTER — Ambulatory Visit (INDEPENDENT_AMBULATORY_CARE_PROVIDER_SITE_OTHER): Payer: PPO | Admitting: Internal Medicine

## 2017-06-09 VITALS — BP 120/78 | HR 103 | Ht 64.0 in | Wt 144.8 lb

## 2017-06-09 DIAGNOSIS — D709 Neutropenia, unspecified: Secondary | ICD-10-CM | POA: Diagnosis not present

## 2017-06-09 DIAGNOSIS — R05 Cough: Secondary | ICD-10-CM | POA: Diagnosis not present

## 2017-06-09 DIAGNOSIS — J9611 Chronic respiratory failure with hypoxia: Secondary | ICD-10-CM | POA: Diagnosis not present

## 2017-06-09 DIAGNOSIS — J189 Pneumonia, unspecified organism: Secondary | ICD-10-CM

## 2017-06-09 DIAGNOSIS — J449 Chronic obstructive pulmonary disease, unspecified: Secondary | ICD-10-CM | POA: Diagnosis not present

## 2017-06-09 DIAGNOSIS — R5081 Fever presenting with conditions classified elsewhere: Secondary | ICD-10-CM

## 2017-06-09 DIAGNOSIS — D6481 Anemia due to antineoplastic chemotherapy: Secondary | ICD-10-CM | POA: Diagnosis not present

## 2017-06-09 DIAGNOSIS — T451X5A Adverse effect of antineoplastic and immunosuppressive drugs, initial encounter: Secondary | ICD-10-CM

## 2017-06-09 LAB — CBC WITH DIFFERENTIAL/PLATELET
BASOS ABS: 0.1 10*3/uL (ref 0.0–0.1)
Basophils Relative: 0.8 % (ref 0.0–3.0)
EOS ABS: 0.1 10*3/uL (ref 0.0–0.7)
EOS PCT: 1.4 % (ref 0.0–5.0)
HCT: 26.1 % — ABNORMAL LOW (ref 36.0–46.0)
LYMPHS ABS: 0.4 10*3/uL — AB (ref 0.7–4.0)
Lymphocytes Relative: 5.1 % — ABNORMAL LOW (ref 12.0–46.0)
MCHC: 33.7 g/dL (ref 30.0–36.0)
MCV: 88.7 fl (ref 78.0–100.0)
MONO ABS: 1.4 10*3/uL — AB (ref 0.1–1.0)
Monocytes Relative: 16.1 % — ABNORMAL HIGH (ref 3.0–12.0)
NEUTROS PCT: 76.6 % (ref 43.0–77.0)
Neutro Abs: 6.4 10*3/uL (ref 1.4–7.7)
Platelets: 238 10*3/uL (ref 150.0–400.0)
RBC: 2.94 Mil/uL — AB (ref 3.87–5.11)
RDW: 23.7 % — ABNORMAL HIGH (ref 11.5–15.5)
WBC: 8.4 10*3/uL (ref 4.0–10.5)

## 2017-06-09 MED ORDER — OMEPRAZOLE 20 MG PO CPDR: DELAYED_RELEASE_CAPSULE | ORAL | 2 refills | Status: AC

## 2017-06-09 MED ORDER — METHYLPREDNISOLONE ACETATE 80 MG/ML IJ SUSP
120.0000 mg | Freq: Once | INTRAMUSCULAR | Status: AC
  Administered 2017-06-09: 120 mg via INTRAMUSCULAR

## 2017-06-09 MED ORDER — LEVOFLOXACIN 750 MG PO TABS: 750.0000 mg | ORAL_TABLET | Freq: Every day | ORAL | 0 refills | Status: DC

## 2017-06-09 NOTE — Progress Notes (Signed)
@Patient  ID: Renee Vance, female    DOB: 09-Apr-1941    MRN: [id number]    Referring provider: [name], PA-C   HPI: 7 yowf quit smoking 04/2014  followed for COPD GOLD II criteria , Chronic hypoxic resp failure and  Metastatic non-small cell lung cancer initially diagnosed as Stage IIB (T2a, N1, M0) non-small cell lung cancer, squamous cell carcinoma presented with left hilar mass diagnosed in August 2017. Undergoing Concurrent chemoradiation.     TESTS Spirometry 04/2011 showed ratio 62 with FEV1 of 56%, FVC of 69% and DLCO 73% and corrected for alveolar volume.She was told that she has moderate COPD and was placed on Spiriva    CT chest 01/2017 -moderate left effusion  04/20/2017 NP Follow up : COPD, HCAP, O2 RF  Pt returns for 1 month follow up for pneumonia . Admitted in June with severe pancytopenia and PNA . She was tx w/ abx. She is feeling is better with decreased cough and congestion . Still gets winded with walking . No fever or discolored mucus .  She was dx with lung cancer 05/2016 . tx w/ concurrent chemo and radiation.    Has COPD ., Remains on duoneb/pulmicort neb Twice daily  . (unable to afford inhaler )  No flare of cough or wheezing  Remains on O2 at 2l/m .  rec Mucinex DM Twice daily  As needed  Cough/congestion .  Continue on Duoneb .Twice daily  .  Continue on Pulmicort Twice daily      06/09/2017  f/u ov/[name] re:  Chief Complaint  Patient presents with  . Acute Visit    Pt had last chemo treatment three weeks ago, SOB with short walk approx 10 ft wheezing and coughing. DME- [facility] using O2 2liters pulse with exersion and 2 liters con't at rest.    last chemo was 04/14/17 >pancytopenia and anemic> transfusion 05/22/17 / 2lpm 24/7  Breathing worse x 2 weeks/ still comfortable sitting in chair and sleeping at 30 degrees hob baseline  Cough is worse > white mucus and now fever low 100s duoneb just started 3 x daily  But   > 8 h since  last rx today    No obvious day to day or daytime variability or assoc excess/ purulent sputum or mucus plugs or hemoptysis or cp or chest tightness,  overt sinus or hb symptoms on ppi with meals daily. No unusual exp hx or h/o childhood pna/ asthma or knowledge of premature birth.  Sleeping ok without nocturnal  or early am exacerbation  of respiratory  c/o's or need for noct saba. Also denies any obvious fluctuation of symptoms with weather or environmental changes or other aggravating or alleviating factors except as outlined above   Current Medications, Allergies, Complete Past Medical History, Past Surgical History, Family History, and Social History were reviewed in [employer] record.  ROS  The following are not active complaints unless bolded sore throat, dysphagia, dental problems, itching, sneezing,  nasal congestion or excess/ purulent secretions, ear ache,   fever, chills, sweats, unintended wt loss, classically pleuritic or exertional cp,  orthopnea pnd or leg swelling, presyncope, palpitations, abdominal pain, anorexia, nausea, vomiting, diarrhea  or change in bowel or bladder habits, change in stools or urine, dysuria,hematuria,  rash, arthralgias, visual complaints, headache, numbness, weakness generalized  or ataxia or problems with walking or coordination,  change in mood/affect or memory.  Physical Exam   amb anxious wf nad  Wt Readings from Last 3 Encounters:  06/09/17 144 lb 12.8 oz (65.7 kg)  06/01/17 147 lb 6.4 oz (66.9 kg)  05/22/17 153 lb 11.2 oz (69.7 kg)    Vital signs reviewed - Note on arrival 02 sats  95% on 2lpm      HEENT: nl  turbinates bilaterally, and oropharynx. Nl external ear canals without cough reflex - edentulous   NECK :  without JVD/Nodes/TM/ nl carotid upstrokes bilaterally   LUNGS: no acc muscle use,  Nl contour chest with decreased bs L base and insp and exp rhonchi bilaterally esp R base  s cough on  insp and no increased wob at rest    CV:  RRR  no s3 or murmur or increase in P2, and no edema   ABD:  soft and nontender with nl inspiratory excursion in the supine position. No bruits or organomegaly appreciated, bowel sounds nl  MS:  Nl gait/ ext warm without deformities, calf tenderness, cyanosis or clubbing No obvious joint restrictions   SKIN: warm and dry without lesions    NEURO:  alert, approp, nl sensorium with  no motor or cerebellar deficits apparent.       CXR PA and Lateral:   06/09/2017 :    I personally reviewed images and  impression as follows:   No change effusion on L with vol loss/ R s as dz      Assessment & Plan:

## 2017-06-09 NOTE — Patient Instructions (Addendum)
Prilosec(omeprazole)  20 mg Take 30- 60 min before your first and last meals of the day until cough gets better   levaquin  750 mg daily x 5 days   Duoneb(albuterol/ ipatropium) up to every 4 hours as needed   Please remember to go to the lab and x-ray department downstairs in the basement  for your tests - we will call you with the results when they are available.

## 2017-06-10 NOTE — Assessment & Plan Note (Signed)
Clinically hard to exclude post obst pna on L in compromised host > levaquin 750 x 5 days only

## 2017-06-10 NOTE — Assessment & Plan Note (Addendum)
  Lab Results  Component Value Date   HGB 8.8 Repeated and verified X2. (L) 06/09/2017   HGB 6.3 (LL) 05/22/2017   HGB 8.0 (L) 05/18/2017   HGB 10.1 (L) 05/10/2017    Improved s/p tx on 05/22/17 -  no change rx  For now with f/u per oncology planned

## 2017-06-10 NOTE — Assessment & Plan Note (Signed)
Adequate control on present rx, reviewed in detail with pt > no change in rx needed   

## 2017-06-10 NOTE — Assessment & Plan Note (Signed)
Lab Results  Component Value Date   WBC 8.4 06/09/2017   HGB 8.8 Repeated and verified X2. (L) 06/09/2017   HCT 26.1 Repeated and verified X2. (L) 06/09/2017   MCV 88.7 06/09/2017   PLT 238.0 06/09/2017     Resolved, no need for admit for this episode of low grade fever ? hcap (see separate a/p)

## 2017-06-10 NOTE — Assessment & Plan Note (Signed)
04/2011 FEV1 56% with ratio 62  - PFT's  06/29/16  FEV1 1.50 (74 % ) ratio 73  p 8 % improvement from saba p ? prior to study with DLCO  55 % corrects to 71  % for alv volume  With mild curvature and air trapping   Only mild flare in setting of acute bronchitis vs early HCAP   rec depo 120/ increase duoneb up to qid  I had an extended discussion with the patient reviewing all relevant studies completed to date and  lasting 25 minutes of a 40  minute acute office visit with pt new to me    re  severe non-specific but potentially very serious refractory respiratory symptoms of uncertain and potentially multiple  etiologies.   Each maintenance medication was reviewed in detail including most importantly the difference between maintenance and prns and under what circumstances the prns are to be triggered using an action plan format that is not reflected in the computer generated alphabetically organized AVS.    Please see AVS for specific instructions unique to this office visit that I personally wrote and verbalized to the the pt in detail and then reviewed with pt  by my nurse highlighting any changes in therapy/plan of care  recommended at today's visit.

## 2017-06-13 NOTE — Progress Notes (Signed)
Spoke with pt and notified of results per Dr. Wert. Pt verbalized understanding and denied any questions. 

## 2017-06-14 ENCOUNTER — Ambulatory Visit: Payer: PPO

## 2017-06-14 ENCOUNTER — Ambulatory Visit: Payer: PPO | Admitting: Internal Medicine

## 2017-06-14 ENCOUNTER — Ambulatory Visit
Admission: RE | Admit: 2017-06-14 | Discharge: 2017-06-14 | Disposition: A | Discharge status: Home / Self Care | Payer: PPO | Source: Ambulatory Visit | Attending: Radiation Oncology | Admitting: Radiation Oncology

## 2017-06-14 ENCOUNTER — Other Ambulatory Visit: Payer: PPO

## 2017-06-14 DIAGNOSIS — C3492 Malignant neoplasm of unspecified part of left bronchus or lung: Secondary | ICD-10-CM

## 2017-06-14 DIAGNOSIS — Z510 Encounter for antineoplastic radiation therapy: Secondary | ICD-10-CM | POA: Diagnosis not present

## 2017-06-14 NOTE — Progress Notes (Signed)
  Radiation Oncology         [phone] ________________________________  Name: Renee Vance MRN: [id number]  Date: 06/14/2017  DOB: 1941-08-12  Stereotactic Body Radiotherapy Treatment Procedure Note  NARRATIVE:  Renee Vance was brought to the stereotactic radiation treatment machine and placed supine on the CT couch. The patient was set up for stereotactic body radiotherapy on the body fix pillow.  3D TREATMENT PLANNING AND DOSIMETRY:  The patient's radiation plan was reviewed and approved prior to starting treatment.  It showed 3-dimensional radiation distributions overlaid onto the planning CT.  [facility] for the target structures as well as the organs at risk were reviewed. The documentation of this is filed in the radiation oncology EMR.  SIMULATION VERIFICATION:  The patient underwent CT imaging on the treatment unit.  These were carefully aligned to document that the ablative radiation dose would cover the target volume and maximally spare the nearby organs at risk according to the planned distribution.  SPECIAL TREATMENT PROCEDURE: Renee Vance received high dose ablative stereotactic body radiotherapy to the planned target volume without unforeseen complications. Treatment was delivered uneventfully. The high doses associated with stereotactic body radiotherapy and the significant potential risks require careful treatment set up and patient monitoring constituting a special treatment procedure   STEREOTACTIC TREATMENT MANAGEMENT:  Following delivery, the patient was evaluated clinically. The patient tolerated treatment without significant acute effects, and was discharged to home in stable condition.    PLAN: Continue treatment as planned.  ________________________________  Blair Promise, PhD, MD   This document serves as a record of services personally performed by Gery Pray, MD. It was created on his behalf by Arlyce Harman, a trained medical scribe.  The creation of this record is based on the scribe's personal observations and the provider's statements to them. This document has been checked and approved by the attending provider.

## 2017-06-15 ENCOUNTER — Ambulatory Visit: Payer: PPO

## 2017-06-16 ENCOUNTER — Ambulatory Visit
Admission: RE | Admit: 2017-06-16 | Discharge: 2017-06-16 | Disposition: A | Discharge status: Home / Self Care | Payer: PPO | Source: Ambulatory Visit | Attending: Radiation Oncology | Admitting: Radiation Oncology

## 2017-06-16 DIAGNOSIS — Z510 Encounter for antineoplastic radiation therapy: Secondary | ICD-10-CM | POA: Diagnosis not present

## 2017-06-19 ENCOUNTER — Other Ambulatory Visit: Payer: Self-pay

## 2017-06-19 ENCOUNTER — Ambulatory Visit (INDEPENDENT_AMBULATORY_CARE_PROVIDER_SITE_OTHER): Payer: PPO | Admitting: Pediatrics

## 2017-06-19 ENCOUNTER — Encounter: Payer: Self-pay | Admitting: Pediatrics

## 2017-06-19 ENCOUNTER — Ambulatory Visit
Admission: RE | Admit: 2017-06-19 | Discharge: 2017-06-19 | Disposition: A | Discharge status: Home / Self Care | Payer: PPO | Source: Ambulatory Visit | Attending: Radiation Oncology | Admitting: Radiation Oncology

## 2017-06-19 VITALS — BP 121/70 | HR 96 | Temp 97.7°F | Resp 22 | Ht 64.0 in | Wt 143.4 lb

## 2017-06-19 DIAGNOSIS — N183 Chronic kidney disease, stage 3 unspecified: Secondary | ICD-10-CM

## 2017-06-19 DIAGNOSIS — I3139 Other pericardial effusion (noninflammatory): Secondary | ICD-10-CM

## 2017-06-19 DIAGNOSIS — Z510 Encounter for antineoplastic radiation therapy: Secondary | ICD-10-CM | POA: Diagnosis not present

## 2017-06-19 DIAGNOSIS — I10 Essential (primary) hypertension: Secondary | ICD-10-CM | POA: Diagnosis not present

## 2017-06-19 DIAGNOSIS — I480 Paroxysmal atrial fibrillation: Secondary | ICD-10-CM

## 2017-06-19 DIAGNOSIS — T451X5A Adverse effect of antineoplastic and immunosuppressive drugs, initial encounter: Secondary | ICD-10-CM | POA: Diagnosis not present

## 2017-06-19 DIAGNOSIS — D6481 Anemia due to antineoplastic chemotherapy: Secondary | ICD-10-CM

## 2017-06-19 DIAGNOSIS — I25118 Atherosclerotic heart disease of native coronary artery with other forms of angina pectoris: Secondary | ICD-10-CM | POA: Diagnosis not present

## 2017-06-19 DIAGNOSIS — C3492 Malignant neoplasm of unspecified part of left bronchus or lung: Secondary | ICD-10-CM | POA: Diagnosis not present

## 2017-06-19 DIAGNOSIS — I313 Pericardial effusion (noninflammatory): Secondary | ICD-10-CM

## 2017-06-19 DIAGNOSIS — J449 Chronic obstructive pulmonary disease, unspecified: Secondary | ICD-10-CM

## 2017-06-19 MED ORDER — BUDESONIDE 0.5 MG/2ML IN SUSP: 0.5000 mg | Freq: Two times a day (BID) | RESPIRATORY_TRACT | 3 refills | Status: AC

## 2017-06-19 NOTE — Progress Notes (Signed)
Subjective:   Patient ID: Renee Vance, female    DOB: 01/24/1941, 76 y.o.   MRN: [id number] CC: New Patient (Initial Visit) f/u multiple med problems HPI: Renee Vance is a 76 y.o. 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: Breathing has been bothering her for the past few weeks Given steroids and abx 10 days ago for pneumonia Has gotten slightly better since then Still not back to baseline breathing with exercise tolerance wlaking around the house, back to her baseline 2L cont oxygen though Has appt with pulm for f/u tomorrow Had temp to 100.3-100.9 prior to starting abx, no fevers since then Appetite has been ok, down for past few months with ongoing treatments but eating three emals a day now, drinking boost once a day  Has small amount of nose bleeding after nebulizer treatments  Pericardial effusion: found on CT scan, has f/u with cardiology upcoming   Past Medical History:  Diagnosis Date  . 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 Bradycardia   . CAD (coronary artery disease)   . Cellulitis of right forearm 05/10/2017  . COPD (chronic obstructive pulmonary disease) ([place])   . Coronary atherosclerosis of native coronary artery 2011  . Encounter for antineoplastic chemotherapy 06/23/2016  . Fibrocystic breast   . History of radiation therapy 07/05/16 - 08/09/16    Left Lung: 45 Gy in 25 fractions  . HTN (hypertension)   . Hyperlipidemia   .  Hypothyroidism   . Neutropenic fever ([place]) 03/15/2017  . Obesity   . Pain in joint   . Right renal mass 11/30/2016  . 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 Thrombocytopenia ([place]) 03/15/2017   Family History  Problem Relation Age of Onset  . Heart disease Mother    Social History   Social History  . Marital status: Married    Spouse name: N/A  . Number of children: N/A  . Years of education: N/A   Occupational History  . retired    Social History Main Topics  . Smoking status: Former Smoker    Packs/day: 0.50    Years: 60.00    Quit date: 04/10/2014  . Smokeless tobacco: Never Used     Comment: june 2015  . Alcohol use No  . Drug use: No  . Sexual activity: Not Currently   Other Topics Concern  . None   Social History Narrative  . None   ROS: All systems negative other than what is in HPI  Objective:    BP 121/70   Pulse 96   Temp 97.7 F (36.5 C) (Oral)   Resp (!) 22   Ht 5\' 4"  (1.626 m)   Wt 143 lb 6.4 oz (65 kg)   SpO2 90%   BMI 24.61 kg/m   Wt Readings from Last 3 Encounters:  06/19/17 143 lb 6.4 oz (65 kg)  06/09/17 144 lb 12.8 oz (65.7 kg)  06/01/17 147 lb 6.4 oz (66.9 kg)    Gen: NAD, alert, cooperative with exam, NCAT  EYES: EOMI, no conjunctival injection, or no icterus ENT:  TMs pearly gray b/l, OP without erythema LYMPH: no cervical LAD CV: NRRR, normal S1/S2, no murmur, distal pulses 2+ b/l Resp: rales L upper lobe, L anterior chest, no wheezes, moving air fairly well, no wheezes, slightly tachypneic, slightly pursed lip exhalation, speaks in long phrases without SOB Abd: +BS, soft, NTND. Ext: No edema, warm Neuro: Alert and oriented  Assessment & Plan:  Renee Vance was seen today for new patient (initial visit) and med problem f/u.  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 Assunta Found, MD [place]

## 2017-06-20 ENCOUNTER — Ambulatory Visit (INDEPENDENT_AMBULATORY_CARE_PROVIDER_SITE_OTHER): Payer: PPO | Admitting: Internal Medicine

## 2017-06-20 ENCOUNTER — Ambulatory Visit (INDEPENDENT_AMBULATORY_CARE_PROVIDER_SITE_OTHER)
Admission: RE | Admit: 2017-06-20 | Discharge: 2017-06-20 | Disposition: A | Discharge status: Home / Self Care | Payer: PPO | Source: Ambulatory Visit | Attending: Internal Medicine | Admitting: Internal Medicine

## 2017-06-20 ENCOUNTER — Other Ambulatory Visit (INDEPENDENT_AMBULATORY_CARE_PROVIDER_SITE_OTHER): Payer: PPO

## 2017-06-20 ENCOUNTER — Ambulatory Visit: Payer: PPO

## 2017-06-20 ENCOUNTER — Other Ambulatory Visit: Payer: PPO

## 2017-06-20 ENCOUNTER — Encounter: Payer: Self-pay | Admitting: Internal Medicine

## 2017-06-20 VITALS — BP 114/40 | HR 87 | Ht 64.0 in | Wt 140.0 lb

## 2017-06-20 DIAGNOSIS — D6481 Anemia due to antineoplastic chemotherapy: Secondary | ICD-10-CM

## 2017-06-20 DIAGNOSIS — N178 Other acute kidney failure: Secondary | ICD-10-CM | POA: Diagnosis not present

## 2017-06-20 DIAGNOSIS — T451X5A Adverse effect of antineoplastic and immunosuppressive drugs, initial encounter: Secondary | ICD-10-CM

## 2017-06-20 DIAGNOSIS — R0602 Shortness of breath: Secondary | ICD-10-CM | POA: Diagnosis not present

## 2017-06-20 DIAGNOSIS — J189 Pneumonia, unspecified organism: Secondary | ICD-10-CM

## 2017-06-20 DIAGNOSIS — N184 Chronic kidney disease, stage 4 (severe): Secondary | ICD-10-CM

## 2017-06-20 DIAGNOSIS — J9611 Chronic respiratory failure with hypoxia: Secondary | ICD-10-CM | POA: Diagnosis not present

## 2017-06-20 DIAGNOSIS — J449 Chronic obstructive pulmonary disease, unspecified: Secondary | ICD-10-CM

## 2017-06-20 LAB — CBC WITH DIFFERENTIAL/PLATELET
BASOS PCT: 0.7 % (ref 0.0–3.0)
Basophils Absolute: 0.1 10*3/uL (ref 0.0–0.1)
EOS PCT: 2.2 % (ref 0.0–5.0)
Eosinophils Absolute: 0.3 10*3/uL (ref 0.0–0.7)
HEMATOCRIT: 25.8 % — AB (ref 36.0–46.0)
Hemoglobin: 8.4 g/dL — ABNORMAL LOW (ref 12.0–15.0)
LYMPHS PCT: 3.5 % — AB (ref 12.0–46.0)
Lymphs Abs: 0.4 10*3/uL — ABNORMAL LOW (ref 0.7–4.0)
MCHC: 32.8 g/dL (ref 30.0–36.0)
MCV: 91.6 fl (ref 78.0–100.0)
MONOS PCT: 11 % (ref 3.0–12.0)
Monocytes Absolute: 1.3 10*3/uL — ABNORMAL HIGH (ref 0.1–1.0)
NEUTROS ABS: 9.6 10*3/uL — AB (ref 1.4–7.7)
Neutrophils Relative %: 82.6 % — ABNORMAL HIGH (ref 43.0–77.0)
PLATELETS: 251 10*3/uL (ref 150.0–400.0)
RBC: 2.81 Mil/uL — ABNORMAL LOW (ref 3.87–5.11)
RDW: 25.1 % — AB (ref 11.5–15.5)
WBC: 11.7 10*3/uL — ABNORMAL HIGH (ref 4.0–10.5)

## 2017-06-20 LAB — BASIC METABOLIC PANEL
BUN: 40 mg/dL — AB (ref 6–23)
CALCIUM: 9.5 mg/dL (ref 8.4–10.5)
CO2: 27 mEq/L (ref 19–32)
Chloride: 99 mEq/L (ref 96–112)
Creatinine, Ser: 1.75 mg/dL — ABNORMAL HIGH (ref 0.40–1.20)
GFR: 29.89 mL/min — AB (ref >60.00)
GLUCOSE: 102 mg/dL — AB (ref 70–99)
POTASSIUM: 4.4 meq/L (ref 3.5–5.1)
Sodium: 138 mEq/L (ref 135–145)

## 2017-06-20 LAB — SEDIMENTATION RATE: Sed Rate: 30 mm/hr (ref 0–30)

## 2017-06-20 NOTE — Patient Instructions (Addendum)
For cough > mucinex or mucinex dm 1200 mg every 12 hours as needed  Duoneb is twice daily with budesonide automatically and then ok to use duoneb in between if needed   02 change to 2lpm at rest and 4lpm with activity   Please remember to go to the lab and x-ray department downstairs in the basement  for your tests - we will call you with the results when they are available.     Keep your appt with Elsworth Soho and I will see you as needed.

## 2017-06-20 NOTE — Progress Notes (Signed)
@Patient  ID: Renee Vance, female    DOB: July 18, 1941    MRN: [id number]    Referring provider: [name], PA-C   Brief patient profile:  39 yowf quit smoking 04/2014  followed for COPD GOLD II criteria , Chronic hypoxic resp failure and  Metastatic non-small cell lung cancer initially diagnosed as Stage IIB (T2a, N1, M0) non-small cell lung cancer, squamous cell carcinoma presented with left hilar mass diagnosed in August 2017. Undergoing Concurrent chemoradiation.     TESTS Spirometry 04/2011 showed ratio 62 with FEV1 of 56%, FVC of 69% and DLCO 73% and corrected for alveolar volume.She was told that she has moderate COPD and was placed on Spiriva    CT chest 01/2017 -moderate left effusion  04/20/2017 NP Follow up : COPD, HCAP, O2 RF  Pt returns for 1 month follow up for pneumonia . Admitted in June with severe pancytopenia and PNA . She was tx w/ abx. She is feeling is better with decreased cough and congestion . Still gets winded with walking . No fever or discolored mucus .  She was dx with lung cancer 05/2016 . tx w/ concurrent chemo and radiation.    Has COPD ., Remains on duoneb/pulmicort neb Twice daily  . (unable to afford inhaler )  No flare of cough or wheezing  Remains on O2 at 2l/m .  rec Mucinex DM Twice daily  As needed  Cough/congestion .  Continue on Duoneb .Twice daily  .  Continue on Pulmicort Twice daily     06/20/2017  f/u ov/Gerard Bonus re: GOLD II copd/  Chronic resp failure 2lpm 24/7  Confused with instructions re neb use  Chief Complaint  Patient presents with  . Acute Visit    Breathing is still not back to her normal baseline. Her cough is prod with dark brown to red sputum.  She is using Duoneb 4 x daily scheduled.   mucus was white but now more variably dark / sporadic day > noct  Does ok at rest and one pillow  Doe = MMRC3 = can't walk 100 yards even at a slow pace at a flat grade s stopping due to sob  Slow on 3lpm   No obvious day to day or  daytime variability or assoc excess/ purulent sputum or mucus plugs or hemoptysis or cp or chest tightness, subjective wheeze or overt sinus or hb symptoms. No unusual exp hx or h/o childhood pna/ asthma or knowledge of premature birth.  Sleeping ok flat without nocturnal  or early am exacerbation  of respiratory  c/o's or need for noct saba. Also denies any obvious fluctuation of symptoms with weather or environmental changes or other aggravating or alleviating factors except as outlined above   Current Allergies, Complete Past Medical History, Past Surgical History, Family History, and Social History were reviewed in [employer] record.  ROS  The following are not active complaints unless bolded sore throat, dysphagia, dental problems, itching, sneezing,  nasal congestion or disharge of excess mucus or purulent secretions, ear ache,   fever, chills, sweats, unintended wt loss or wt gain, classically pleuritic or exertional cp,  orthopnea pnd or leg swelling, presyncope, palpitations, abdominal pain, anorexia, nausea, vomiting, diarrhea  or change in bowel habits or bladder habits, change in stools or change in urine, dysuria, hematuria,  rash, arthralgias, visual complaints, headache, numbness, weakness or ataxia or problems with walking or coordination,  change in mood/affect or memory.  Current Meds  Medication Sig  . acetaminophen (TYLENOL) 325 MG tablet Take 650 mg by mouth every 6 (six) hours as needed.  Marland Kitchen amLODipine (NORVASC) 5 MG tablet Take 5 mg by mouth daily.  Marland Kitchen aspirin 81 MG tablet Take 81 mg by mouth daily.  Marland Kitchen atorvastatin (LIPITOR) 80 MG tablet Take 80 mg by mouth daily.  . budesonide (PULMICORT) 0.5 MG/2ML nebulizer solution Take 2 mLs (0.5 mg total) by nebulization 2 (two) times daily.  . feeding supplement (BOOST HIGH PROTEIN) LIQD Take 1 Container by mouth daily.  Marland Kitchen ipratropium-albuterol (DUONEB) 0.5-2.5 (3) MG/3ML SOLN Take 3 mLs by nebulization  every 6 (six) hours as needed.  . Iron-Vitamin C 65-125 MG TABS Take by mouth.  . levothyroxine (SYNTHROID, LEVOTHROID) 125 MCG tablet Take 125 mcg by mouth daily before breakfast.   . lidocaine-prilocaine (EMLA) cream Apply 1 application topically as needed.  . metoprolol succinate (TOPROL-XL) 50 MG 24 hr tablet TAKE ONE TABLET BY MOUTH TWICE DAILY  . Multiple Vitamins-Iron (MULTIVITAMIN/IRON PO) Take 1 tablet by mouth daily.  . Multiple Vitamins-Minerals (MULTIVITAMIN WITH MINERALS) tablet Take 1 tablet by mouth daily.  Marland Kitchen omeprazole (PRILOSEC) 20 MG capsule Take 30- 60 min before your first and last meals of the day  . OXYGEN Inhale 2 L into the lungs continuous.  . Vitamin D, Cholecalciferol, 1000 UNITS CAPS Take 1,000 Units by mouth daily.           Physical Exam   amb anxious wf nad   06/20/2017       140   06/09/17 144 lb 12.8 oz (65.7 kg)  06/01/17 147 lb 6.4 oz (66.9 kg)  05/22/17 153 lb 11.2 oz (69.7 kg)    Vital signs reviewed - Note on arrival 02 sats  90% on 2lpm pulsed    HEENT: nl  turbinates bilaterally, and oropharynx. Nl external ear canals without cough reflex - edentulous   NECK :  without JVD/Nodes/TM/ nl carotid upstrokes bilaterally   LUNGS: no acc muscle use,  Nl contour chest with  insp crackles on R and decrease on L    CV:  RRR  no s3 or murmur or increase in P2, and no edema   ABD:  soft and nontender with nl inspiratory excursion in the supine position. No bruits or organomegaly appreciated, bowel sounds nl  MS:  Nl gait/ ext warm without deformities, calf tenderness, cyanosis or clubbing No obvious joint restrictions   SKIN: warm and dry without lesions    NEURO:  alert, approp, nl sensorium with  no motor or cerebellar deficits apparent.        CXR PA and Lateral:   06/20/2017 :    I personally reviewed images and agree with radiology impression as follows:    Stable chronic changes throughout the lung left-greater-than-right. No  definite active process. Stable left pleural thickening or small left pleural effusion.    Labs ordered/ reviewed:      Chemistry      Component Value Date/Time   NA 138 06/20/2017 1443   NA 140 05/22/2017 1208   K 4.4 06/20/2017 1443   K 4.4 05/22/2017 1208   CL 99 06/20/2017 1443   CO2 27 06/20/2017 1443   CO2 29 05/22/2017 1208   BUN 40 (H) 06/20/2017 1443   BUN 28.8 (H) 05/22/2017 1208   CREATININE 1.75 (H) 06/20/2017 1443   CREATININE 1.5 (H) 05/22/2017 1208      Component Value Date/Time   CALCIUM 9.5  06/20/2017 1443   CALCIUM 9.2 05/22/2017 1208   ALKPHOS 78 05/22/2017 1208   AST 32 05/22/2017 1208   ALT 28 05/22/2017 1208   BILITOT 0.82 05/22/2017 1208        Lab Results  Component Value Date   WBC 11.7 (H) 06/20/2017   HGB 8.4 Repeated and verified X2. (L) 06/20/2017   HCT 25.8 (L) 06/20/2017   MCV 91.6 06/20/2017   PLT 251.0 06/20/2017       Lab Results  Component Value Date   TSH 0.619 03/17/2017         Lab Results  Component Value Date   ESRSEDRATE 30 06/20/2017           Assessment & Plan:

## 2017-06-21 ENCOUNTER — Ambulatory Visit
Admission: RE | Admit: 2017-06-21 | Discharge: 2017-06-21 | Disposition: A | Discharge status: Home / Self Care | Payer: PPO | Source: Ambulatory Visit | Attending: Radiation Oncology | Admitting: Radiation Oncology

## 2017-06-21 ENCOUNTER — Other Ambulatory Visit: Payer: PPO

## 2017-06-21 ENCOUNTER — Ambulatory Visit: Payer: PPO

## 2017-06-21 DIAGNOSIS — C3492 Malignant neoplasm of unspecified part of left bronchus or lung: Secondary | ICD-10-CM

## 2017-06-21 DIAGNOSIS — Z510 Encounter for antineoplastic radiation therapy: Secondary | ICD-10-CM | POA: Diagnosis not present

## 2017-06-21 LAB — PATHOLOGIST SMEAR REVIEW

## 2017-06-21 NOTE — Assessment & Plan Note (Signed)
No evidence of active infection/ she is not neutropenic so rec max rx for copd and prn mucinex/ hold abx for now but def at risk of post obst pna/ hcap on L where has been treated for ca

## 2017-06-21 NOTE — Progress Notes (Signed)
Spoke with pt and notified of results per Dr. Wert. Pt verbalized understanding and denied any questions. 

## 2017-06-21 NOTE — Assessment & Plan Note (Signed)
  Lab Results  Component Value Date   HGB 8.4 Repeated and verified X2. (L) 06/20/2017   HGB 8.8 Repeated and verified X2. (L) 06/09/2017   HGB 6.3 (LL) 05/22/2017   HGB 8.0 (L) 05/18/2017   HGB 10.1 (L) 05/10/2017    Def contributing to resp symptoms > f/u oncology planned

## 2017-06-21 NOTE — Assessment & Plan Note (Signed)
04/2011 FEV1 56% with ratio 62  - PFT's  06/29/16  FEV1 1.50 (74 % ) ratio 73  p 8 % improvement from saba p ? prior to study with DLCO  55 % corrects to 71  % for alv volume  With mild curvature and air trapping    Confused with neb instructions - should be duoneb bid with pulmoncort and ok to use duoneb by itself prn in between maint rx  I had an extended discussion with the patient reviewing all relevant studies completed to date and  lasting 15 to 20 minutes of a 25 minute visit    Each maintenance medication was reviewed in detail including most importantly the difference between maintenance and prns and under what circumstances the prns are to be triggered using an action plan format that is not reflected in the computer generated alphabetically organized AVS.    Please see AVS for specific instructions unique to this visit that I personally wrote and verbalized to the the pt in detail and then reviewed with pt  by my nurse highlighting any  changes in therapy recommended at today's visit to their plan of care.

## 2017-06-21 NOTE — Progress Notes (Signed)
Spoke with pt and notified of results per Dr. Melvyn Novas. Pt verbalized understanding and denied any questions. She states not on any diuretic, and she will work on staying hydrated

## 2017-06-21 NOTE — Progress Notes (Signed)
  Radiation Oncology         [phone] ________________________________  Name: Renee Vance MRN: [id number]  Date: 06/21/2017  DOB: April 01, 1941  Stereotactic Body Radiotherapy Treatment Procedure Note  NARRATIVE:  Renee Vance was brought to the stereotactic radiation treatment machine and placed supine on the CT couch. The patient was set up for stereotactic body radiotherapy on the body fix pillow.  3D TREATMENT PLANNING AND DOSIMETRY:  The patient's radiation plan was reviewed and approved prior to starting treatment.  It showed 3-dimensional radiation distributions overlaid onto the planning CT.  [facility] for the target structures as well as the organs at risk were reviewed. The documentation of this is filed in the radiation oncology EMR.  SIMULATION VERIFICATION:  The patient underwent CT imaging on the treatment unit.  These were carefully aligned to document that the ablative radiation dose would cover the target volume and maximally spare the nearby organs at risk according to the planned distribution.  SPECIAL TREATMENT PROCEDURE: Renee Vance received high dose ablative stereotactic body radiotherapy to the planned target volume without unforeseen complications. Treatment was delivered uneventfully. The high doses associated with stereotactic body radiotherapy and the significant potential risks require careful treatment set up and patient monitoring constituting a special treatment procedure   STEREOTACTIC TREATMENT MANAGEMENT:  Following delivery, the patient was evaluated clinically. The patient tolerated treatment without significant acute effects, and was discharged to home in stable condition.    PLAN: Continue treatment as planned.  ________________________________  Blair Promise, PhD, MD

## 2017-06-21 NOTE — Assessment & Plan Note (Signed)
Lab Results  Component Value Date   CREATININE 1.75 (H) 06/20/2017   CREATININE 1.5 (H) 05/22/2017   CREATININE 1.7 (H) 05/18/2017   CREATININE 1.5 (H) 05/10/2017     Really no change, not on any nephrotoxins, so no change rx

## 2017-06-21 NOTE — Assessment & Plan Note (Signed)
06/20/2017   Walked RA x one lap @ 185 stopped due to  Sob/ sats 83% improved to 90% with 4lpm POC  As of 06/20/2017 rec 2lpm 24/7 but increase to 4lpm on POC with walking

## 2017-06-22 DIAGNOSIS — C3410 Malignant neoplasm of upper lobe, unspecified bronchus or lung: Secondary | ICD-10-CM | POA: Diagnosis not present

## 2017-06-22 DIAGNOSIS — J449 Chronic obstructive pulmonary disease, unspecified: Secondary | ICD-10-CM | POA: Diagnosis not present

## 2017-06-23 ENCOUNTER — Ambulatory Visit: Payer: PPO | Admitting: Radiation Oncology

## 2017-06-23 ENCOUNTER — Ambulatory Visit
Admission: RE | Admit: 2017-06-23 | Discharge: 2017-06-23 | Disposition: A | Discharge status: Home / Self Care | Payer: PPO | Source: Ambulatory Visit | Attending: Radiation Oncology | Admitting: Radiation Oncology

## 2017-06-23 ENCOUNTER — Encounter: Payer: Self-pay | Admitting: Radiation Oncology

## 2017-06-23 DIAGNOSIS — C3412 Malignant neoplasm of upper lobe, left bronchus or lung: Secondary | ICD-10-CM | POA: Diagnosis not present

## 2017-06-23 DIAGNOSIS — Z510 Encounter for antineoplastic radiation therapy: Secondary | ICD-10-CM | POA: Diagnosis not present

## 2017-06-26 ENCOUNTER — Ambulatory Visit: Payer: PPO | Admitting: Radiation Oncology

## 2017-06-26 NOTE — Progress Notes (Signed)
  Radiation Oncology         [phone] ________________________________  Name: Renee Vance MRN: [id number]  Date: 06/23/2017  DOB: December 13, 1940  End of Treatment Note  Diagnosis:  Stage IIB(T2a, N1, M0) non-small cell lung cancer, squamous cell carcinoma,presented with left hilar mass, now with an enlarging left adrenal mass (oligometastasis).     Indication for treatment:  Palliative       Radiation treatment dates:   06/14/2017 to 06/23/2017  Site/dose:   The Left adrenal mass was treated to 40 Gy in 5 fractions of 8 Gy.   Beams/energy:   SBRT // 10X  Narrative: The patient tolerated stereotactic body radiation treatment relatively well.   She denies nausea, vomiting, or cramps. Reports mild fatigue. Denies having any pain. Reports urinary frequency but is drinking a lot of fluids. Reports having a poor appetite and losing weight. She is planning to see a dietician at [facility].   Plan: The patient has completed radiation treatment. The patient will return to radiation oncology clinic for routine followup in one month. I advised them to call or return sooner if they have any questions or concerns related to their recovery or treatment.  -----------------------------------  Blair Promise, PhD, MD  This document serves as a record of services personally performed by Gery Pray, MD. It was created on his behalf by Arlyce Harman, a trained medical scribe. The creation of this record is based on the scribe's personal observations and the provider's statements to them. This document has been checked and approved by the attending provider.

## 2017-06-27 ENCOUNTER — Telehealth: Payer: Self-pay

## 2017-06-27 NOTE — Telephone Encounter (Signed)
Nutrition Assessment   Reason for Assessment:   Patient identified on Malnutrition Screening Tool for weight loss and poor appetite.   ASSESSMENT:  76 year old female with stage IV squamous cell carcinoma of left lung.  Patient has stopped chemotherapy and has completed radiation therapy.  Past medical history of CAD, COPD, CAD, HTN, HLD.    Spoke with patient via phone.  Patient reports that her appetite has been decreased since starting chemotherapy.  Reports food taste bland.  Reports that she is still eating but not able to eat the same amount as before.  Reports she eats cereal or toast for breakfast, 1/2 sandwich and chips and for dinner meat and vegetables but only eats about 1/2. Reports that she has been drinking boost original daily.    Nutrition Focused Physical Exam: unable to perform  Medications: MVI, prilosec, Vit D  Labs: glucose 102, BUN 40, creatinine 1.75  Anthropometrics:   Height: 64 inches Weight: 140 lb BMI: 24 2% weight loss in the last 2 weeks.    Estimated Energy Needs  Kcals: 6295-2841 calories/d Protein: 80-96 g/d Fluid: 1.9 L/d  NUTRITION DIAGNOSIS: Inadequate oral intake related to cancer related treatment side effects (lack of taste, poor appetite) as evidenced by 2% weight loos in 2 weeks and eating less than normal intake    INTERVENTION:   Discussed ways to increase calories and protein.  Will mail fact sheet. Discussed oral nutrition supplements.  Will mail coupons Contact information given to patient and patient will call if needed.    MONITORING, EVALUATION, GOAL: weight trends, intake   NEXT VISIT: as needed  Charee Tumblin B. Zenia Resides, [place], [place] Registered Dietitian [phone] (pager)

## 2017-06-30 ENCOUNTER — Encounter: Payer: Self-pay | Admitting: Pulmonary Disease

## 2017-06-30 ENCOUNTER — Ambulatory Visit (INDEPENDENT_AMBULATORY_CARE_PROVIDER_SITE_OTHER): Payer: PPO | Admitting: Pulmonary Disease

## 2017-06-30 DIAGNOSIS — J700 Acute pulmonary manifestations due to radiation: Secondary | ICD-10-CM | POA: Diagnosis not present

## 2017-06-30 DIAGNOSIS — C3492 Malignant neoplasm of unspecified part of left bronchus or lung: Secondary | ICD-10-CM | POA: Diagnosis not present

## 2017-06-30 MED ORDER — IPRATROPIUM-ALBUTEROL 0.5-2.5 (3) MG/3ML IN SOLN: 3.0000 mL | Freq: Four times a day (QID) | RESPIRATORY_TRACT | 0 refills | Status: AC | PRN

## 2017-06-30 MED ORDER — PREDNISONE 10 MG PO TABS: ORAL_TABLET | ORAL | 0 refills | Status: DC

## 2017-06-30 NOTE — Assessment & Plan Note (Signed)
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

## 2017-06-30 NOTE — Patient Instructions (Signed)
Start taking prednisone 10 mg tablets-take 2 tablets with food in the morning -do not take on empty stomach  Continue duo nebs every 6 hours as needed

## 2017-06-30 NOTE — Progress Notes (Signed)
   Subjective:    Patient ID: Renee Vance, female    DOB: May 19, 1941, 76 y.o.   MRN: [id number]  HPI  75 year old ex-smoker  for FU of COPD And lung cancer She smoked more than half pack per day for about 50 years before she quit in 2015- about 30-pack-years.  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 She underwent concurrent chemoradiation. She did not tolerate chemotherapy well and of the pancytopenia and several hospitalizations, unfortunately her disease progression with left adrenal metastases and underwent radiation for this  She was diagnosed with influenza in 10/2016 with community acquired pneumonia and has been maintained on oxygen since then   I note frequent office visits with my partner Dr. Melvyn Novas was given Levaquin for healthcare associated pneumonia. She now reports small amount of white sputum. I reviewed all her imaging studies CT chest from 05/2017 shows extensive infiltrates in the left lung and evidence of interstitial disease even in the right. On review of prior CT scans, ILD seems to date back to 05/2016 and seems to be considerably worse now  Hemoglobin was 8.4, in the past transfusion has not helped her dyspnea   Significant tests/ events reviewed  Spirometry 04/2011 showed ratio 62 with FEV1 of 56%, FVC of 69% and DLCO 73% and corrected for alveolar volume.  PFT's  06/29/16  FEV1 1.50 (74 % ) ratio 73  p 8 % improvement from saba p ? prior to study with DLCO  55 % corrects to 71  % for alv volume  With mild curvature and air trapping   CT chest  01/2017 -moderate left effusion  Review of Systems neg for any significant sore throat, dysphagia, itching, sneezing, nasal congestion or excess/ purulent secretions, fever, chills, sweats, unintended wt loss, pleuritic or exertional cp, hempoptysis, orthopnea pnd or change in chronic leg swelling.   Also denies presyncope, palpitations, heartburn, abdominal pain, nausea, vomiting,  diarrhea or change in bowel or urinary habits, dysuria,hematuria, rash, arthralgias, visual complaints, headache, numbness weakness or ataxia.     Objective:   Physical Exam   Gen. Pleasant, well-nourished, in no distress ENT - no thrush, no post nasal drip Neck: No JVD, no thyromegaly, no carotid bruits Lungs: no use of accessory muscles, no dullness to percussion, Bl scattered rales, no rhonchi  Cardiovascular: Rhythm regular, heart sounds  normal, no murmurs or gallops, no peripheral edema Musculoskeletal: No deformities, no cyanosis or clubbing         Assessment & Plan:

## 2017-06-30 NOTE — Assessment & Plan Note (Signed)
Differential diagnosis includes lymphangitic spread of cancer. Would defer to oncology she is a candidate for further treatment. She has not tolerated chemotherapy well in the past

## 2017-07-03 ENCOUNTER — Ambulatory Visit (HOSPITAL_BASED_OUTPATIENT_CLINIC_OR_DEPARTMENT_OTHER): Payer: PPO

## 2017-07-03 DIAGNOSIS — C7972 Secondary malignant neoplasm of left adrenal gland: Secondary | ICD-10-CM | POA: Diagnosis not present

## 2017-07-03 DIAGNOSIS — C3492 Malignant neoplasm of unspecified part of left bronchus or lung: Secondary | ICD-10-CM | POA: Diagnosis not present

## 2017-07-03 DIAGNOSIS — Z95828 Presence of other vascular implants and grafts: Secondary | ICD-10-CM

## 2017-07-03 DIAGNOSIS — Z452 Encounter for adjustment and management of vascular access device: Secondary | ICD-10-CM | POA: Diagnosis not present

## 2017-07-03 MED ORDER — SODIUM CHLORIDE 0.9% FLUSH
10.0000 mL | Freq: Once | INTRAVENOUS | Status: AC
  Administered 2017-07-03: 10 mL
  Filled 2017-07-03: qty 10

## 2017-07-03 MED ORDER — HEPARIN SOD (PORK) LOCK FLUSH 100 UNIT/ML IV SOLN
500.0000 [IU] | Freq: Once | INTRAVENOUS | Status: AC
  Administered 2017-07-03: 500 [IU]
  Filled 2017-07-03: qty 5

## 2017-07-04 ENCOUNTER — Ambulatory Visit: Payer: PPO | Admitting: Internal Medicine

## 2017-07-04 ENCOUNTER — Other Ambulatory Visit: Payer: PPO

## 2017-07-04 ENCOUNTER — Ambulatory Visit: Payer: PPO

## 2017-07-05 DIAGNOSIS — C3492 Malignant neoplasm of unspecified part of left bronchus or lung: Secondary | ICD-10-CM | POA: Diagnosis not present

## 2017-07-05 DIAGNOSIS — J449 Chronic obstructive pulmonary disease, unspecified: Secondary | ICD-10-CM | POA: Diagnosis not present

## 2017-07-06 ENCOUNTER — Ambulatory Visit (INDEPENDENT_AMBULATORY_CARE_PROVIDER_SITE_OTHER): Payer: PPO

## 2017-07-06 DIAGNOSIS — H353132 Nonexudative age-related macular degeneration, bilateral, intermediate dry stage: Secondary | ICD-10-CM | POA: Diagnosis not present

## 2017-07-06 DIAGNOSIS — H52223 Regular astigmatism, bilateral: Secondary | ICD-10-CM | POA: Diagnosis not present

## 2017-07-06 DIAGNOSIS — H25813 Combined forms of age-related cataract, bilateral: Secondary | ICD-10-CM | POA: Diagnosis not present

## 2017-07-06 DIAGNOSIS — Z23 Encounter for immunization: Secondary | ICD-10-CM

## 2017-07-07 DIAGNOSIS — C3492 Malignant neoplasm of unspecified part of left bronchus or lung: Secondary | ICD-10-CM | POA: Diagnosis not present

## 2017-07-07 DIAGNOSIS — J449 Chronic obstructive pulmonary disease, unspecified: Secondary | ICD-10-CM | POA: Diagnosis not present

## 2017-07-11 ENCOUNTER — Ambulatory Visit: Payer: PPO

## 2017-07-11 ENCOUNTER — Other Ambulatory Visit: Payer: PPO

## 2017-07-11 DIAGNOSIS — M9902 Segmental and somatic dysfunction of thoracic region: Secondary | ICD-10-CM | POA: Diagnosis not present

## 2017-07-11 DIAGNOSIS — M47812 Spondylosis without myelopathy or radiculopathy, cervical region: Secondary | ICD-10-CM | POA: Diagnosis not present

## 2017-07-11 DIAGNOSIS — M47816 Spondylosis without myelopathy or radiculopathy, lumbar region: Secondary | ICD-10-CM | POA: Diagnosis not present

## 2017-07-11 DIAGNOSIS — S134XXA Sprain of ligaments of cervical spine, initial encounter: Secondary | ICD-10-CM | POA: Diagnosis not present

## 2017-07-11 DIAGNOSIS — M9903 Segmental and somatic dysfunction of lumbar region: Secondary | ICD-10-CM | POA: Diagnosis not present

## 2017-07-11 DIAGNOSIS — M546 Pain in thoracic spine: Secondary | ICD-10-CM | POA: Diagnosis not present

## 2017-07-11 DIAGNOSIS — M9901 Segmental and somatic dysfunction of cervical region: Secondary | ICD-10-CM | POA: Diagnosis not present

## 2017-07-17 ENCOUNTER — Telehealth: Payer: Self-pay | Admitting: Pulmonary Disease

## 2017-07-17 NOTE — Telephone Encounter (Signed)
Pt has been scheduled for 07/31/17 with RA. Pt is aware and voiced her understanding. Nothing further needed.

## 2017-07-20 ENCOUNTER — Telehealth: Payer: Self-pay | Admitting: Oncology

## 2017-07-20 NOTE — Telephone Encounter (Signed)
Patient called and asked if her appointment for 07/31/17 with Dr. Sondra Come can be moved to 08/23/17.  She is seeing Dr. Julien Nordmann on that day and said it is hard for her to come for appointments as she lives in [place].  Appointment rescheduled for 08/23/17 at 2:30 pm.

## 2017-07-22 DIAGNOSIS — J449 Chronic obstructive pulmonary disease, unspecified: Secondary | ICD-10-CM | POA: Diagnosis not present

## 2017-07-22 DIAGNOSIS — C3410 Malignant neoplasm of upper lobe, unspecified bronchus or lung: Secondary | ICD-10-CM | POA: Diagnosis not present

## 2017-07-27 ENCOUNTER — Ambulatory Visit (INDEPENDENT_AMBULATORY_CARE_PROVIDER_SITE_OTHER): Payer: PPO | Admitting: Interventional Cardiology

## 2017-07-27 ENCOUNTER — Encounter: Payer: Self-pay | Admitting: Interventional Cardiology

## 2017-07-27 ENCOUNTER — Ambulatory Visit: Payer: PPO | Admitting: Interventional Cardiology

## 2017-07-27 VITALS — BP 156/70 | HR 85 | Ht 64.0 in | Wt 147.4 lb

## 2017-07-27 DIAGNOSIS — I10 Essential (primary) hypertension: Secondary | ICD-10-CM | POA: Diagnosis not present

## 2017-07-27 DIAGNOSIS — I25119 Atherosclerotic heart disease of native coronary artery with unspecified angina pectoris: Secondary | ICD-10-CM | POA: Diagnosis not present

## 2017-07-27 DIAGNOSIS — I313 Pericardial effusion (noninflammatory): Secondary | ICD-10-CM | POA: Diagnosis not present

## 2017-07-27 DIAGNOSIS — I480 Paroxysmal atrial fibrillation: Secondary | ICD-10-CM

## 2017-07-27 DIAGNOSIS — I3139 Other pericardial effusion (noninflammatory): Secondary | ICD-10-CM

## 2017-07-27 NOTE — Patient Instructions (Addendum)
Medication Instructions:  Your physician recommends that you continue on your current medications as directed. Please refer to the Current Medication list given to you today.   Labwork: None  Testing/Procedures: Your physician has requested that you have an echocardiogram. Echocardiography is a painless test that uses sound waves to create images of your heart. It provides your doctor with information about the size and shape of your heart and how well your heart's chambers and valves are working. This procedure takes approximately one hour. There are no restrictions for this procedure.   Follow-Up: Your physician recommends that you schedule a follow-up appointment with Dr. Percival Spanish in [place] in December.   Any Other Special Instructions Will Be Listed Below (If Applicable).     If you need a refill on your cardiac medications before your next appointment, please call your pharmacy.

## 2017-07-27 NOTE — Progress Notes (Addendum)
Cardiology Office Note    Date:  07/27/2017   ID:  RAVIN BENDALL, DOB [birth year], MRN [id number]  PCP:  [name], MD  Cardiologist: Sinclair Grooms, MD   Chief Complaint  Patient presents with  . Coronary Artery Disease  . Pericardial Effusion    History of Present Illness:  Renee Vance is a 76 y.o. female with cardiac history of paroxysmal atrial fibrillation, coronary artery disease with drug-eluting stent in the distal RCA and intermediate stenosis in the LAD had cath in 2011, COPD, hyperlipidemia, obesity, and relatively recent diagnosis of squamous cell carcinoma of the left upper lobe. She is status post radiochemotherapy with good response. She is now being considered for left upper lobectomy. FEV1 is 70%.  Referred for evaluation of fluid around her heart. CT scan from August 2018 demonstrated a moderate pericardial effusion that had increased in size compared to 12/26/2016 CT. She is not having chest pain. Over the last couple weeks she has been noticing lower extremity swelling and breathing is getting slightly worse. Her pulmonologist started prednisone because of shortness of breath. She feels that this is helped her breathing somewhat.   Past Medical History:  Diagnosis Date  . 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 Bradycardia   . CAD (coronary artery disease)   . Cellulitis of right forearm 05/10/2017  . COPD (chronic obstructive pulmonary disease) ([employer])   . Coronary atherosclerosis of native coronary artery 2011  . Encounter for antineoplastic chemotherapy 06/23/2016  . Fibrocystic breast   . History of radiation therapy 07/05/16 - 08/09/16    Left Lung: 45 Gy in 25 fractions  . HTN (hypertension)   . Hyperlipidemia   . Hypothyroidism   . Neutropenic fever ([place]) 03/15/2017  . Obesity   . Pain in joint   . Right renal mass 11/30/2016  . 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 Thrombocytopenia ([place])  03/15/2017    Past Surgical History:  Procedure Laterality Date  . CHOLECYSTECTOMY    . CORONARY ANGIOPLASTY WITH STENT PLACEMENT  2011   [place]main 30-40%, LAD 65-75% (FFR 0.88), CFX 55-60%, RCA 95%>0 w/ 2.5 x 12 mm monorail stent  . IR FLUORO GUIDE PORT INSERTION RIGHT  04/07/2017  . IR US GUIDE VASC ACCESS RIGHT  04/07/2017  . TUBAL LIGATION    . VIDEO BRONCHOSCOPY Bilateral 06/07/2016   Procedure: VIDEO BRONCHOSCOPY WITH FLUORO;  Surgeon: [name], MD;  Location: [facility];  Service: Cardiopulmonary;  Laterality: Bilateral;    Current Medications: Outpatient Medications Prior to Visit  Medication Sig Dispense Refill  . acetaminophen (TYLENOL) 325 MG tablet Take 650 mg by mouth every 6 (six) hours as needed.    Marland Kitchen amLODipine (NORVASC) 5 MG tablet Take 5 mg by mouth daily.    Marland Kitchen aspirin 81 MG tablet Take 81 mg by mouth daily.    Marland Kitchen atorvastatin (LIPITOR) 80 MG tablet Take 80 mg by mouth daily.    . budesonide (PULMICORT) 0.5 MG/2ML nebulizer solution Take 2 mLs (0.5 mg total) by nebulization 2 (two) times daily. 120 mL 3  . feeding supplement (BOOST HIGH PROTEIN) LIQD Take 1 Container by mouth daily.    Marland Kitchen ipratropium-albuterol (DUONEB) 0.5-2.5 (3) MG/3ML SOLN Take 3 mLs by nebulization every 6 (six) hours as needed. 360 mL 0  . Iron-Vitamin C 65-125 MG TABS Take by mouth.    . levothyroxine (SYNTHROID, LEVOTHROID) 125 MCG tablet Take 125 mcg by mouth daily  before breakfast.     . lidocaine-prilocaine (EMLA) cream Apply 1 application topically as needed. 30 g 0  . metoprolol succinate (TOPROL-XL) 50 MG 24 hr tablet TAKE ONE TABLET BY MOUTH TWICE DAILY 180 tablet 3  . Multiple Vitamins-Iron (MULTIVITAMIN/IRON PO) Take 1 tablet by mouth daily.    . Multiple Vitamins-Minerals (MULTIVITAMIN WITH MINERALS) tablet Take 1 tablet by mouth daily.    Marland Kitchen omeprazole (PRILOSEC) 20 MG capsule Take 30- 60 min before your first and last meals of the day 60 capsule 2  . OXYGEN Inhale 2 L into the lungs  continuous.    . predniSONE (DELTASONE) 10 MG tablet Take 2 tablets in the morning with food. 60 tablet 0  . Vitamin D, Cholecalciferol, 1000 UNITS CAPS Take 1,000 Units by mouth daily.      No facility-administered medications prior to visit.      Allergies:   Ampicillin and Codeine   Social History   Social History  . Marital status: Married    Spouse name: N/A  . Number of children: N/A  . Years of education: N/A   Occupational History  . retired    Social History Main Topics  . Smoking status: Former Smoker    Packs/day: 0.50    Years: 60.00    Quit date: 04/10/2014  . Smokeless tobacco: Never Used     Comment: june 2015  . Alcohol use No  . Drug use: No  . Sexual activity: Not Currently   Other Topics Concern  . None   Social History Narrative  . None     Family History:  The patient's family history includes Heart disease in her mother.   ROS:   Please see the history of present illness.    Vision disturbance, oxygen requirement increasing. Legs hurting.  All other systems reviewed and are negative.   PHYSICAL EXAM:   VS:  BP (!) 156/70 (BP Location: Right Arm)   Pulse 85   Ht 5\' 4"  (1.626 m)   Wt 147 lb 6.4 oz (66.9 kg)   BMI 25.30 kg/m    GEN: Well nourished, well developed, in no acute distress . Repeat blood pressure without any evidence of pulsus paradox. HEENT: normal  Neck: Moderate JVD with the patient sitting., carotid bruits, or masses Cardiac: RRR; no murmurs, rubs, or gallops,no edema  Respiratory:  clear to auscultation bilaterally, normal work of breathing GI: soft, nontender, nondistended, + BS MS: no deformity or atrophy  Skin: warm and dry, no rash Neuro:  Alert and Oriented x 3, Strength and sensation are intact Psych: euthymic mood, full affect  Wt Readings from Last 3 Encounters:  07/27/17 147 lb 6.4 oz (66.9 kg)  06/30/17 140 lb (63.5 kg)  06/20/17 140 lb (63.5 kg)      Studies/Labs Reviewed:   EKG:  EKG from 03/03/2017   Normal sinus rhythm, lateral T-wave abnormality, inferior ST elevation, and no acute change when compared to January.  Recent Labs: 10/30/2016: B Natriuretic Peptide 963.1 03/17/2017: TSH 0.619 03/21/2017: Magnesium 1.6 05/22/2017: ALT 28 06/20/2017: BUN 40; Creatinine, Ser 1.75; Hemoglobin 8.4 Repeated and verified X2.; Platelets 251.0; Potassium 4.4; Sodium 138   Lipid Panel No results found for: CHOL, TRIG, HDL, CHOLHDL, VLDL, LDLCALC, LDLDIRECT  Additional studies/ records that were reviewed today include:  Chest CT with contrast 05/28/2017: IMPRESSION: 1. Enlarging, previously hypermetabolic mass of the lateral limb left adrenal gland favoring metastatic disease. 2. Abnormal hypodense lesion medially along the right kidney lower pole  concerning for a renal cell carcinoma. This lesion has been difficult to characterize on multiple prior exams due to the lack of contrast due to the patient's renal insufficiency, and the normal high activity in the renal parenchyma on PET scans which tends to obscure renal lesions. However, back in 2014 there was very subtle heterogeneity of enhancement at this location and the underlying lesion now seems larger. Consider renal protocol MRI using half dose Magnevist contrast agent due to the patient's renal insufficiency in order to further clarify ; normally we try to avoid gadolinium contrast in patients with GFR under 30 due to the risk of NSF, but this patient's GFR has hovered around 30 ml/min/1.76m^2, and the benefits may outweigh the risk in this case. 3. Stable lung fibrosis and regions of airspace opacity in the left lung associated with left hemithoracic volume loss. 4. Low-density blood pool suggests anemia. 5.  Aortic Atherosclerosis (ICD10-I70.0).  Coronary atherosclerosis. 6. Moderate pericardial effusion, increased from prior. 7. Stable moderate left pleural effusion and left pleural thickening. 8. Emphysema (ICD10-J43.9). Probable  mild-to-moderate central narrowing of the thecal sac L4-5 due to degenerative subluxation and facet arthropathy. 9. Borderline wall thickening in the duodenum, proximal jejunum, and sigmoid colon.    ASSESSMENT:    1. Pericardial effusion   2. PAF (paroxysmal atrial fibrillation) ([place])   3. Coronary artery disease involving native coronary artery of native heart with angina pectoris ([place])   4. Essential hypertension      PLAN:  In order of problems listed above:  1. Progressive enlargement in pericardial effusion from March to August based on CT findings. She now has lower extremity swelling and modest jugular vein distention sitting upright. There has been some progressive shortness of breath. She is being treated with prednisone for the possibility of radiation pneumonitis by Dr. Elsworth Soho. I am concerned that she either inflammatory etiology from radiation or pericardial infiltration due to metastases. Plan to Doppler echocardiogram to assess for evidence of pericardial effusion. Further management will depend upon findings. My initial thought of given diuretics will be placed on hold until the pericardial issue can be resolved. 2. Currently stable without clinical evidence of recurrent A. fib. Last available EKG reveals sinus rhythm. 3. Stable without angina pectoris. 4. Blood pressure is elevated today. This could be related to prednisone. We may add low-dose diuretic therapy if significant pericardial effusion is not documented by echocardiography.  The patient plans to switch her cardiology to the [facility] office where she will be cared for by Dr. Percival Spanish. After reviewing her records, it appears that overall prognosis over the next 12-18 months is relatively poor.  Medication Adjustments/Labs and Tests Ordered: Current medicines are reviewed at length with the patient today.  Concerns regarding medicines are outlined above.  Medication changes, Labs and Tests ordered today are listed in  the Patient Instructions below. Patient Instructions  Medication Instructions:  Your physician recommends that you continue on your current medications as directed. Please refer to the Current Medication list given to you today.   Labwork: None  Testing/Procedures: Your physician has requested that you have an echocardiogram. Echocardiography is a painless test that uses sound waves to create images of your heart. It provides your doctor with information about the size and shape of your heart and how well your heart's chambers and valves are working. This procedure takes approximately one hour. There are no restrictions for this procedure.   Follow-Up: Your physician recommends that you schedule a follow-up appointment with Dr.  Hochrein in [place] in December.   Any Other Special Instructions Will Be Listed Below (If Applicable).     If you need a refill on your cardiac medications before your next appointment, please call your pharmacy.      Signed, Sinclair Grooms, MD  07/27/2017 5:24 PM    [address], [place], [place]  [address] Phone: [phone]; Fax: [phone]

## 2017-07-31 ENCOUNTER — Other Ambulatory Visit: Payer: Self-pay | Admitting: *Deleted

## 2017-07-31 ENCOUNTER — Ambulatory Visit (INDEPENDENT_AMBULATORY_CARE_PROVIDER_SITE_OTHER)
Admission: RE | Admit: 2017-07-31 | Discharge: 2017-07-31 | Disposition: A | Discharge status: Home / Self Care | Payer: PPO | Source: Ambulatory Visit | Attending: Pulmonary Disease | Admitting: Pulmonary Disease

## 2017-07-31 ENCOUNTER — Ambulatory Visit: Payer: PPO | Admitting: Radiation Oncology

## 2017-07-31 ENCOUNTER — Encounter: Payer: Self-pay | Admitting: Pulmonary Disease

## 2017-07-31 ENCOUNTER — Ambulatory Visit (INDEPENDENT_AMBULATORY_CARE_PROVIDER_SITE_OTHER): Payer: PPO | Admitting: Pulmonary Disease

## 2017-07-31 VITALS — BP 128/74 | HR 73 | Ht 64.0 in | Wt 147.6 lb

## 2017-07-31 DIAGNOSIS — Z880 Allergy status to penicillin: Secondary | ICD-10-CM | POA: Insufficient documentation

## 2017-07-31 DIAGNOSIS — J8410 Pulmonary fibrosis, unspecified: Secondary | ICD-10-CM | POA: Insufficient documentation

## 2017-07-31 DIAGNOSIS — Z7982 Long term (current) use of aspirin: Secondary | ICD-10-CM | POA: Insufficient documentation

## 2017-07-31 DIAGNOSIS — J90 Pleural effusion, not elsewhere classified: Secondary | ICD-10-CM | POA: Insufficient documentation

## 2017-07-31 DIAGNOSIS — Z885 Allergy status to narcotic agent status: Secondary | ICD-10-CM | POA: Insufficient documentation

## 2017-07-31 DIAGNOSIS — J700 Acute pulmonary manifestations due to radiation: Secondary | ICD-10-CM

## 2017-07-31 DIAGNOSIS — E279 Disorder of adrenal gland, unspecified: Secondary | ICD-10-CM | POA: Insufficient documentation

## 2017-07-31 DIAGNOSIS — Z510 Encounter for antineoplastic radiation therapy: Secondary | ICD-10-CM | POA: Insufficient documentation

## 2017-07-31 DIAGNOSIS — N289 Disorder of kidney and ureter, unspecified: Secondary | ICD-10-CM | POA: Insufficient documentation

## 2017-07-31 DIAGNOSIS — Z79899 Other long term (current) drug therapy: Secondary | ICD-10-CM | POA: Insufficient documentation

## 2017-07-31 DIAGNOSIS — C3490 Malignant neoplasm of unspecified part of unspecified bronchus or lung: Secondary | ICD-10-CM | POA: Diagnosis not present

## 2017-07-31 DIAGNOSIS — G8929 Other chronic pain: Secondary | ICD-10-CM | POA: Insufficient documentation

## 2017-07-31 DIAGNOSIS — C3492 Malignant neoplasm of unspecified part of left bronchus or lung: Secondary | ICD-10-CM | POA: Insufficient documentation

## 2017-07-31 MED ORDER — PREDNISONE 10 MG PO TABS: 10.0000 mg | ORAL_TABLET | Freq: Every day | ORAL | 0 refills | Status: DC

## 2017-07-31 MED ORDER — ATORVASTATIN CALCIUM 80 MG PO TABS: 80.0000 mg | ORAL_TABLET | Freq: Every day | ORAL | 1 refills | Status: AC

## 2017-07-31 MED ORDER — FUROSEMIDE 40 MG PO TABS: 40.0000 mg | ORAL_TABLET | Freq: Every day | ORAL | 0 refills | Status: DC

## 2017-07-31 NOTE — Assessment & Plan Note (Signed)
Chest x-ray today. Start Taking prednisone 10 mg daily until next visit # 40

## 2017-07-31 NOTE — Assessment & Plan Note (Signed)
Await CT chest in November

## 2017-07-31 NOTE — Patient Instructions (Signed)
Chest x-ray today. Start Taking prednisone 10 mg daily until next visit # 40  Take Lasix 40 mg daily for 7 days   Good luck with echocardiogram test tomorrow

## 2017-07-31 NOTE — Assessment & Plan Note (Signed)
Ct O2 

## 2017-07-31 NOTE — Progress Notes (Signed)
   Subjective:    Patient ID: Renee Vance, female    DOB: 01/03/1941, 76 y.o.   MRN: [id number]  HPI  76 year old ex-smoker for FU of COPD And lung cancer She smoked more than half pack per day for about 50 years before she quit in 2015- about 30-pack-years.  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 She underwent concurrent chemoradiation. She did not tolerate chemotherapy well and of the pancytopenia and several hospitalizations, unfortunately her disease progression with left adrenal metastases and underwent radiation for this  She was diagnosed with influenza in 10/2016 with community acquired pneumonia and has been maintained on oxygen since then   CT chest from 05/2017 shows extensive infiltrates in the left lung and evidence of interstitial disease even in the right. On review of prior CT scans, ILD seems to date back to 05/2016 and is worse now. Moderate pericardial effusion was noted  She was started on 20 mg of prednisone last visit 06/2017 and this was helped her breathing. She denies sputum production. She is developing increasing pedal edema since last visit, saw cardiologist and echocardiogram is planned for tomorrow She reports wheezing when she lies down on her left side especially in the mornings   Significant tests/ events reviewed  Spirometry 04/2011 showed ratio 62 with FEV1 of 56%, FVC of 69% and DLCO 73% and corrected for alveolar volume.  PFT's 06/29/16 FEV1 1.50 (74 % ) ratio 73 p 8 % improvement from saba p ? prior to study with DLCO 55 % corrects to 71 % for alv volume With mild curvature and air trapping   CT chest 01/2017 -moderate left effusion  Past Medical History:  Diagnosis Date  . 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 Bradycardia   . CAD (coronary artery disease)   . Cellulitis of right forearm 05/10/2017  . COPD (chronic obstructive pulmonary disease) ([place])   . Coronary atherosclerosis  of native coronary artery 2011  . Encounter for antineoplastic chemotherapy 06/23/2016  . Fibrocystic breast   . History of radiation therapy 07/05/16 - 08/09/16    Left Lung: 45 Gy in 25 fractions  . HTN (hypertension)   . Hyperlipidemia   . Hypothyroidism   . Neutropenic fever ([place]) 03/15/2017  . Obesity   . Pain in joint   . Right renal mass 11/30/2016  . 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 Thrombocytopenia ([place]) 03/15/2017     Review of Systems neg for any significant sore throat, dysphagia, itching, sneezing, nasal congestion or excess/ purulent secretions, fever, chills, sweats, unintended wt loss, pleuritic or exertional cp, hempoptysis, orthopnea pnd or change in chronic leg swelling. Also denies presyncope, palpitations, heartburn, abdominal pain, nausea, vomiting, diarrhea or change in bowel or urinary habits, dysuria,hematuria, rash, arthralgias, visual complaints, headache, numbness weakness or ataxia.     Objective:   Physical Exam  Gen. Pleasant, well-nourished, in no distress, on O2 ENT - no thrush, no post nasal drip Neck: No JVD, no thyromegaly, no carotid bruits Lungs: no use of accessory muscles, no dullness to percussion, decreased breath sounds on left without rales or rhonchi  Cardiovascular: Rhythm regular, heart sounds  normal, no murmurs or gallops, 2+ peripheral edema Musculoskeletal: No deformities, no cyanosis or clubbing        Assessment & Plan:

## 2017-07-31 NOTE — Assessment & Plan Note (Signed)
Echocardiogram planned for tomorrow, if worse would need pericardiocentesis  Take Lasix 40 mg daily for 7 days -she will call if she has any problems

## 2017-08-01 ENCOUNTER — Ambulatory Visit (HOSPITAL_COMMUNITY)
Admission: RE | Admit: 2017-08-01 | Discharge: 2017-08-01 | Disposition: A | Discharge status: Home / Self Care | Payer: PPO | Source: Ambulatory Visit | Attending: Interventional Cardiology | Admitting: Interventional Cardiology

## 2017-08-01 ENCOUNTER — Telehealth: Payer: Self-pay | Admitting: Interventional Cardiology

## 2017-08-01 DIAGNOSIS — I480 Paroxysmal atrial fibrillation: Secondary | ICD-10-CM | POA: Diagnosis not present

## 2017-08-01 DIAGNOSIS — I313 Pericardial effusion (noninflammatory): Secondary | ICD-10-CM | POA: Insufficient documentation

## 2017-08-01 DIAGNOSIS — I2510 Atherosclerotic heart disease of native coronary artery without angina pectoris: Secondary | ICD-10-CM | POA: Insufficient documentation

## 2017-08-01 DIAGNOSIS — Z85118 Personal history of other malignant neoplasm of bronchus and lung: Secondary | ICD-10-CM | POA: Diagnosis not present

## 2017-08-01 DIAGNOSIS — J449 Chronic obstructive pulmonary disease, unspecified: Secondary | ICD-10-CM | POA: Diagnosis not present

## 2017-08-01 DIAGNOSIS — I10 Essential (primary) hypertension: Secondary | ICD-10-CM | POA: Insufficient documentation

## 2017-08-01 DIAGNOSIS — I3139 Other pericardial effusion (noninflammatory): Secondary | ICD-10-CM

## 2017-08-01 LAB — ECHOCARDIOGRAM COMPLETE
CHL CUP MV DEC (S): 246
CHL CUP RV SYS PRESS: 17 mmHg
CHL CUP TV REG PEAK VELOCITY: 150 cm/s
E decel time: 246 msec
EERAT: 24.71
FS: 31 % (ref 28–44)
IVS/LV PW RATIO, ED: 0.97
LA diam end sys: 23 mm
LA diam index: 1.31 cm/m2
LA vol A4C: 41 ml
LASIZE: 23 mm
LAVOL: 40.1 mL
LAVOLIN: 22.9 mL/m2
LV E/e'average: 24.71
LV dias vol index: 35 mL/m2
LV dias vol: 61 mL (ref 46–106)
LV sys vol index: 11 mL/m2
LV sys vol: 19 mL
LVEEMED: 24.71
LVELAT: 3.59 cm/s
LVOT VTI: 21.2 cm
LVOT area: 2.84 cm2
LVOT diameter: 19 mm
LVOT peak grad rest: 4 mmHg
LVOTPV: 106 cm/s
LVOTSV: 60 mL
MV Peak grad: 3 mmHg
MV pk A vel: 108 m/s
MV pk E vel: 88.7 m/s
PW: 12.1 mm — AB (ref 0.6–1.1)
RV LATERAL S' VELOCITY: 16.1 cm/s
RV TAPSE: 15.5 mm
Simpson's disk: 68
Stroke v: 41 ml
TDI e' lateral: 3.59
TDI e' medial: 4.9
TR max vel: 150 cm/s

## 2017-08-01 NOTE — Progress Notes (Signed)
*  PRELIMINARY RESULTS* Echocardiogram 2D Echocardiogram has been performed.  Renee Vance 08/01/2017, 1:48 PM

## 2017-08-01 NOTE — Telephone Encounter (Signed)
New Message ° ° pt verbalized that she is returning call for rn °

## 2017-08-01 NOTE — Telephone Encounter (Signed)
Dr. Tamala Julian wanted pt to come in this week to discuss echo and further tx.  Scheduled pt to be seen 08/04/17.  Pt verbalized understanding and was in agreement with this plan.

## 2017-08-04 ENCOUNTER — Encounter: Payer: Self-pay | Admitting: Interventional Cardiology

## 2017-08-04 ENCOUNTER — Ambulatory Visit (INDEPENDENT_AMBULATORY_CARE_PROVIDER_SITE_OTHER): Payer: PPO | Admitting: Interventional Cardiology

## 2017-08-04 VITALS — BP 146/70 | HR 78 | Ht 64.0 in | Wt 142.1 lb

## 2017-08-04 DIAGNOSIS — C3492 Malignant neoplasm of unspecified part of left bronchus or lung: Secondary | ICD-10-CM | POA: Diagnosis not present

## 2017-08-04 DIAGNOSIS — I25119 Atherosclerotic heart disease of native coronary artery with unspecified angina pectoris: Secondary | ICD-10-CM | POA: Diagnosis not present

## 2017-08-04 DIAGNOSIS — J9611 Chronic respiratory failure with hypoxia: Secondary | ICD-10-CM

## 2017-08-04 DIAGNOSIS — I313 Pericardial effusion (noninflammatory): Secondary | ICD-10-CM

## 2017-08-04 DIAGNOSIS — I480 Paroxysmal atrial fibrillation: Secondary | ICD-10-CM | POA: Diagnosis not present

## 2017-08-04 DIAGNOSIS — J449 Chronic obstructive pulmonary disease, unspecified: Secondary | ICD-10-CM | POA: Diagnosis not present

## 2017-08-04 DIAGNOSIS — I3139 Other pericardial effusion (noninflammatory): Secondary | ICD-10-CM

## 2017-08-04 NOTE — Progress Notes (Signed)
Cardiology Office Note    Date:  08/04/2017   ID:  Renee Vance, DOB [birth year], MRN [id number]  PCP:  [name], MD  Cardiologist: Sinclair Grooms, MD   Chief Complaint  Patient presents with  . Follow-up    Large Pericardial effusion    History of Present Illness:  Renee Vance is a 76 y.o. female with cardiac history of paroxysmal atrial fibrillation, coronary artery disease with drug-eluting stent in the distal RCA and intermediate stenosis in the LAD had cath in 2011, COPD, hyperlipidemia, obesity, and relatively recent diagnosis of squamous cell carcinoma of the left upper lobe. She is status post radiochemotherapy with good response. She is now being considered for left upper lobectomy. FEV1 is 70%.  She returns today after we performed echocardiography to identify severity of pericardial effusion noted on recent chest CT scan. Over the past 3-6 months to paracardial effusion has been noted and on the last CT was considered large. Echocardiography demonstrated a large effusion with right ventricular inversion but no true echocardiographic evidence of tamponade. I spoke with Dr. Elsworth Soho, and we both agree that the next clinical move would be to drain the pericardial effusion percutaneously. She is on continuous oxygen therapy and a pericardial window which would require intubation could be an issue that would increase risk for pulmonary complications.   Past Medical History:  Diagnosis Date  . 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 Bradycardia   . CAD (coronary artery disease)   . Cellulitis of right forearm 05/10/2017  . COPD (chronic obstructive pulmonary disease) ([place])   . Coronary atherosclerosis of native coronary artery 2011  . Encounter for antineoplastic chemotherapy 06/23/2016  . Fibrocystic breast   . History of radiation therapy 07/05/16 - 08/09/16    Left Lung: 45 Gy in 25 fractions  . HTN (hypertension)   . Hyperlipidemia   .  Hypothyroidism   . Neutropenic fever ([place]) 03/15/2017  . Obesity   . Pain in joint   . Right renal mass 11/30/2016  . 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 Thrombocytopenia ([place]) 03/15/2017    Past Surgical History:  Procedure Laterality Date  . CHOLECYSTECTOMY    . CORONARY ANGIOPLASTY WITH STENT PLACEMENT  2011   Lmain 30-40%, LAD 65-75% (FFR 0.88), CFX 55-60%, RCA 95%>0 w/ 2.5 x 12 mm monorail stent  . IR FLUORO GUIDE PORT INSERTION RIGHT  04/07/2017  . IR US GUIDE VASC ACCESS RIGHT  04/07/2017  . TUBAL LIGATION    . VIDEO BRONCHOSCOPY Bilateral 06/07/2016   Procedure: VIDEO BRONCHOSCOPY WITH FLUORO;  Surgeon: [name], MD;  Location: [facility];  Service: Cardiopulmonary;  Laterality: Bilateral;    Current Medications: Outpatient Medications Prior to Visit  Medication Sig Dispense Refill  . acetaminophen (TYLENOL) 325 MG tablet Take 650 mg by mouth every 6 (six) hours as needed.    Marland Kitchen amLODipine (NORVASC) 5 MG tablet Take 5 mg by mouth daily.    Marland Kitchen aspirin 81 MG tablet Take 81 mg by mouth daily.    Marland Kitchen atorvastatin (LIPITOR) 80 MG tablet Take 1 tablet (80 mg total) by mouth daily. 90 tablet 1  . budesonide (PULMICORT) 0.5 MG/2ML nebulizer solution Take 2 mLs (0.5 mg total) by nebulization 2 (two) times daily. 120 mL 3  . feeding supplement (BOOST HIGH PROTEIN) LIQD Take 1 Container by mouth daily.    . furosemide (LASIX) 40 MG tablet Take 1 tablet (40 mg  total) by mouth daily. 10 tablet 0  . ipratropium-albuterol (DUONEB) 0.5-2.5 (3) MG/3ML SOLN Take 3 mLs by nebulization every 6 (six) hours as needed. 360 mL 0  . levothyroxine (SYNTHROID, LEVOTHROID) 125 MCG tablet Take 125 mcg by mouth daily before breakfast.     . lidocaine-prilocaine (EMLA) cream Apply 1 application topically as needed. 30 g 0  . metoprolol succinate (TOPROL-XL) 50 MG 24 hr tablet TAKE ONE TABLET BY MOUTH TWICE DAILY 180 tablet 3  . Multiple Vitamins-Iron (MULTIVITAMIN/IRON PO) Take 1  tablet by mouth daily.    . Multiple Vitamins-Minerals (MULTIVITAMIN WITH MINERALS) tablet Take 1 tablet by mouth daily.    Marland Kitchen omeprazole (PRILOSEC) 20 MG capsule Take 30- 60 min before your first and last meals of the day 60 capsule 2  . OXYGEN Inhale 2 L into the lungs continuous.    . predniSONE (DELTASONE) 10 MG tablet Take 1 tablet (10 mg total) by mouth daily with breakfast. 20 tablet 0  . Vitamin D, Cholecalciferol, 1000 UNITS CAPS Take 1,000 Units by mouth daily.      No facility-administered medications prior to visit.      Allergies:   Ampicillin and Codeine   Social History   Social History  . Marital status: Married    Spouse name: N/A  . Number of children: N/A  . Years of education: N/A   Occupational History  . retired    Social History Main Topics  . Smoking status: Former Smoker    Packs/day: 0.50    Years: 60.00    Quit date: 04/10/2014  . Smokeless tobacco: Never Used     Comment: june 2015  . Alcohol use No  . Drug use: No  . Sexual activity: Not Currently   Other Topics Concern  . None   Social History Narrative  . None     Family History:  The patient's family history includes Heart disease in her mother.   ROS:   Please see the history of present illness.    Progressive shortness of breath, worse over the past month.  All other systems reviewed and are negative.   PHYSICAL EXAM:   VS:  BP (!) 146/70   Pulse 78   Ht 5\' 4"  (1.626 m)   Wt 142 lb 1.9 oz (64.5 kg)   SpO2 91%   BMI 24.39 kg/m    GEN: Well nourished, well developed, in no acute distress . Pulses paradoxus is not identified. HEENT: normal  Neck: no significant JVD, carotid bruits, or masses Cardiac: RRR; no murmurs, rubs, or gallops,no edema  Respiratory:  clear to auscultation bilaterally, normal work of breathing GI: soft, nontender, nondistended, + BS MS: no deformity or atrophy  Skin: warm and dry, no rash Neuro:  Alert and Oriented x 3, Strength and sensation are  intact Psych: euthymic mood, full affect  Wt Readings from Last 3 Encounters:  08/04/17 142 lb 1.9 oz (64.5 kg)  07/31/17 147 lb 9.6 oz (67 kg)  07/27/17 147 lb 6.4 oz (66.9 kg)      Studies/Labs Reviewed:   EKG:  EKG  Not repeated.  Recent Labs: 10/30/2016: B Natriuretic Peptide 963.1 03/17/2017: TSH 0.619 03/21/2017: Magnesium 1.6 05/22/2017: ALT 28 06/20/2017: BUN 40; Creatinine, Ser 1.75; Hemoglobin 8.4 Repeated and verified X2.; Platelets 251.0; Potassium 4.4; Sodium 138   Lipid Panel No results found for: CHOL, TRIG, HDL, CHOLHDL, VLDL, LDLCALC, LDLDIRECT  Additional studies/ records that were reviewed today include:  Study Conclusions   -  Left ventricle: The cavity size was normal. There was mild   concentric hypertrophy. Systolic function was normal. The   estimated ejection fraction was in the range of 60% to 65%. Wall   motion was normal; there were no regional wall motion   abnormalities. Doppler parameters are consistent with abnormal   left ventricular relaxation (grade 1 diastolic dysfunction).   Doppler parameters are consistent with high ventricular filling   pressure. - Aortic valve: Mildly calcified annulus. Trileaflet; mildly   calcified leaflets. - Mitral valve: There was trivial regurgitation. - Right atrium: Central venous pressure (est): 15 mm Hg. - Tricuspid valve: There was trivial regurgitation. - Pulmonary arteries: PA peak pressure: 24 mm Hg (S). - Pericardium, extracardiac: A large pericardial effusion was   identified circumferential to the heart. There is evidence of   chronicity and organization with echodense material surrounding   the LV and RV. There is moderate undulation of the right atrial   free wall with compression, swinging of the heart within the   effusion, potential compression of the RV seen in the subcostal   views although not seen in parasternal or apical views   specifically. Mitral inflow not gated to respiration, but  there   does look to be variation which suggests LV and RV   interdependence. Taken together study consistent with early   tamponade physiology and a slowly enlarging effusion. Patient in   no distress at time of the study. Blood pressure 129/72, heart   rate around 70. Results called to Dr. Tamala Julian.   Impressions:   - A large pericardial effusion was identified circumferential to   the heart. There is evidence of chronicity and organization with   echodense material surrounding the LV and RV. There is moderate   undulation of the right atrial free wall with compression,   swinging of the heart within the effusion, potential compression   of the RV seen in the subcostal views although not seen in   parasternal or apical views specifically. Mitral inflow not gated   to respiration, but there does look to be variation which   suggests LV and RV interdependence. Taken together study   consistent with early tamponade physiology and a slowly enlarging   effusion. Patient in no distress at time of the study. Blood   pressure 129/72, heart rate around 70. Results called to Dr.   Tamala Julian.     ASSESSMENT:    1. Pericardial effusion   2. PAF (paroxysmal atrial fibrillation) (HCC)   3. Stage IV squamous cell carcinoma of left lung ([place])   4. Chronic respiratory failure with hypoxia (HCC)   5. Coronary artery disease involving native coronary artery of native heart with angina pectoris ([place])      PLAN:  In order of problems listed above:  1. Large pericardial effusion without clinical evidence of pericardial tap and not. The clinical situation suggests a malignant pericardial effusion in the setting of stage IV squamous cell carcinoma of the lung. After speaking with Dr. Elsworth Soho, percutaneous pericardial drainage will be arranged for this coming Monday. The procedure and risks were discussed with the patient and husband in detail. Risks include pneumothorax, hepatic laceration, cardiac  laceration, bleeding, arrhythmia, death, myocardial infarction,and respiratory failure. The patient understands the risks and potential benefits (diagnosis,prognosis, and improvement in respiratory status). 2. Currently in normal sinus rhythm 3. Addressed above under paracardial effusion and the likelihood of malignancy invading the paracardial. 4. Continue O2 therapy. Attempting to avoid intubation related  to surgical pericardial window.      Medication Adjustments/Labs and Tests Ordered: Current medicines are reviewed at length with the patient today.  Concerns regarding medicines are outlined above.  Medication changes, Labs and Tests ordered today are listed in the Patient Instructions below. Patient Instructions  Medication Instructions:  Your physician recommends that you continue on your current medications as directed. Please refer to the Current Medication list given to you today.  Labwork: BMET, CBC and INR today  Testing/Procedures: Your physician recommends that you have a Pericardiocentesis.   Follow-Up: Your physician recommends that you schedule a follow-up appointment in 1 week after your procedure.    Any Other Special Instructions Will Be Listed Below (If Applicable).    [place] OFFICE [address] [place] [address] Dept: [phone] Loc: [place]  08/04/2017  You are scheduled for a Pericardiocentesis on Monday, October 29 with Dr. Daneen Schick.  1. Please arrive at the [facility] (Main Entrance A) at [facility]: [address] [place], [place] [address] at 11:30 AM (two hours before your procedure to ensure your preparation). Free valet parking service is available.   Special note: Every effort is made to have your procedure done on time. Please understand that emergencies sometimes delay scheduled procedures.  2. Diet: Do  not eat or drink anything after midnight prior to your procedure except sips of water to take medications.  3. Labs: Will be drawn today  4. Medication instructions in preparation for your procedure:  Do not take your Aspirin or Furosemide the morning of your procedure.   On the morning of your procedure, take your  any morning medicines NOT listed above.  You may use sips of water.  5. Plan for one night stay--bring personal belongings. 6. Bring a current list of your medications and current insurance cards. 7. You MUST have a responsible person to drive you home. 8. Someone MUST be with you the first 24 hours after you arrive home or your discharge will be delayed. 9. Please wear clothes that are easy to get on and off and wear slip-on shoes.  Thank you for allowing [place] to care for you!   -- [facility]    If you need a refill on your cardiac medications before your next appointment, please call your pharmacy.      Signed, Sinclair Grooms, MD  08/04/2017 5:34 PM    [facility] [place], [place], [place]  [address] Phone: [phone]; Fax: [phone]

## 2017-08-04 NOTE — Patient Instructions (Signed)
Medication Instructions:  Your physician recommends that you continue on your current medications as directed. Please refer to the Current Medication list given to you today.  Labwork: BMET, CBC and INR today  Testing/Procedures: Your physician recommends that you have a Pericardiocentesis.   Follow-Up: Your physician recommends that you schedule a follow-up appointment in 1 week after your procedure.    Any Other Special Instructions Will Be Listed Below (If Applicable).    [place] OFFICE [address], [place] [address] Dept: [phone] Loc: [place]  08/04/2017  You are scheduled for a Pericardiocentesis on Monday, October 29 with Dr. Daneen Schick.  1. Please arrive at the [facility] (Main Entrance A) at [facility]: [address] [place], [place] [address] at 11:30 AM (two hours before your procedure to ensure your preparation). Free valet parking service is available.   Special note: Every effort is made to have your procedure done on time. Please understand that emergencies sometimes delay scheduled procedures.  2. Diet: Do not eat or drink anything after midnight prior to your procedure except sips of water to take medications.  3. Labs: Will be drawn today  4. Medication instructions in preparation for your procedure:  Do not take your Aspirin or Furosemide the morning of your procedure.   On the morning of your procedure, take your  any morning medicines NOT listed above.  You may use sips of water.  5. Plan for one night stay--bring personal belongings. 6. Bring a current list of your medications and current insurance cards. 7. You MUST have a responsible person to drive you home. 8. Someone MUST be with you the first 24 hours after you arrive home or your discharge will be delayed. 9. Please wear clothes that are easy to get on and  off and wear slip-on shoes.  Thank you for allowing [place] to care for you!   -- [place]    If you need a refill on your cardiac medications before your next appointment, please call your pharmacy.

## 2017-08-05 LAB — BASIC METABOLIC PANEL
BUN / CREAT RATIO: 24 (ref 12–28)
BUN: 36 mg/dL — ABNORMAL HIGH (ref 8–27)
CHLORIDE: 93 mmol/L — AB (ref 96–106)
CO2: 31 mmol/L — ABNORMAL HIGH (ref 20–29)
Calcium: 9.3 mg/dL (ref 8.7–10.3)
Creatinine, Ser: 1.48 mg/dL — ABNORMAL HIGH (ref 0.57–1.00)
GFR calc Af Amer: 39 mL/min/{1.73_m2} — ABNORMAL LOW (ref >59)
GFR calc non Af Amer: 34 mL/min/{1.73_m2} — ABNORMAL LOW (ref >59)
GLUCOSE: 135 mg/dL — AB (ref 65–99)
POTASSIUM: 4.5 mmol/L (ref 3.5–5.2)
SODIUM: 140 mmol/L (ref 134–144)

## 2017-08-05 LAB — PROTIME-INR
INR: 1.1 (ref 0.8–1.2)
PROTHROMBIN TIME: 11.2 s (ref 9.1–12.0)

## 2017-08-05 LAB — CBC
Hematocrit: 32.8 % — ABNORMAL LOW (ref 34.0–46.6)
Hemoglobin: 10.1 g/dL — ABNORMAL LOW (ref 11.1–15.9)
MCH: 31 pg (ref 26.6–33.0)
MCHC: 30.8 g/dL — ABNORMAL LOW (ref 31.5–35.7)
MCV: 101 fL — ABNORMAL HIGH (ref 79–97)
PLATELETS: 160 10*3/uL (ref 150–379)
RBC: 3.26 x10E6/uL — ABNORMAL LOW (ref 3.77–5.28)
RDW: 19.4 % — AB (ref 12.3–15.4)
WBC: 7.5 10*3/uL (ref 3.4–10.8)

## 2017-08-06 DIAGNOSIS — C3492 Malignant neoplasm of unspecified part of left bronchus or lung: Secondary | ICD-10-CM | POA: Diagnosis not present

## 2017-08-06 DIAGNOSIS — J449 Chronic obstructive pulmonary disease, unspecified: Secondary | ICD-10-CM | POA: Diagnosis not present

## 2017-08-07 ENCOUNTER — Inpatient Hospital Stay (HOSPITAL_COMMUNITY): Payer: PPO

## 2017-08-07 ENCOUNTER — Inpatient Hospital Stay (HOSPITAL_COMMUNITY)
Admission: RE | Admit: 2017-08-07 | Discharge: 2017-08-10 | DRG: 314 | Disposition: A | Discharge status: Home / Self Care | Payer: PPO | Source: Ambulatory Visit | Attending: Interventional Cardiology | Admitting: Interventional Cardiology

## 2017-08-07 ENCOUNTER — Encounter (HOSPITAL_COMMUNITY)
Admission: RE | Disposition: A | Discharge status: Home / Self Care | Payer: Self-pay | Source: Ambulatory Visit | Attending: Interventional Cardiology

## 2017-08-07 DIAGNOSIS — I313 Pericardial effusion (noninflammatory): Principal | ICD-10-CM | POA: Diagnosis present

## 2017-08-07 DIAGNOSIS — I25119 Atherosclerotic heart disease of native coronary artery with unspecified angina pectoris: Secondary | ICD-10-CM | POA: Diagnosis present

## 2017-08-07 DIAGNOSIS — N6019 Diffuse cystic mastopathy of unspecified breast: Secondary | ICD-10-CM | POA: Diagnosis not present

## 2017-08-07 DIAGNOSIS — Q205 Discordant atrioventricular connection: Secondary | ICD-10-CM

## 2017-08-07 DIAGNOSIS — J700 Acute pulmonary manifestations due to radiation: Secondary | ICD-10-CM | POA: Diagnosis present

## 2017-08-07 DIAGNOSIS — N183 Chronic kidney disease, stage 3 (moderate): Secondary | ICD-10-CM | POA: Diagnosis present

## 2017-08-07 DIAGNOSIS — Z9981 Dependence on supplemental oxygen: Secondary | ICD-10-CM | POA: Diagnosis not present

## 2017-08-07 DIAGNOSIS — I480 Paroxysmal atrial fibrillation: Secondary | ICD-10-CM | POA: Diagnosis not present

## 2017-08-07 DIAGNOSIS — E8770 Fluid overload, unspecified: Secondary | ICD-10-CM | POA: Diagnosis present

## 2017-08-07 DIAGNOSIS — Z79899 Other long term (current) drug therapy: Secondary | ICD-10-CM

## 2017-08-07 DIAGNOSIS — C3412 Malignant neoplasm of upper lobe, left bronchus or lung: Secondary | ICD-10-CM | POA: Diagnosis not present

## 2017-08-07 DIAGNOSIS — Z955 Presence of coronary angioplasty implant and graft: Secondary | ICD-10-CM | POA: Diagnosis not present

## 2017-08-07 DIAGNOSIS — Z7982 Long term (current) use of aspirin: Secondary | ICD-10-CM | POA: Diagnosis not present

## 2017-08-07 DIAGNOSIS — E785 Hyperlipidemia, unspecified: Secondary | ICD-10-CM | POA: Diagnosis present

## 2017-08-07 DIAGNOSIS — I129 Hypertensive chronic kidney disease with stage 1 through stage 4 chronic kidney disease, or unspecified chronic kidney disease: Secondary | ICD-10-CM | POA: Diagnosis present

## 2017-08-07 DIAGNOSIS — C3492 Malignant neoplasm of unspecified part of left bronchus or lung: Secondary | ICD-10-CM | POA: Diagnosis not present

## 2017-08-07 DIAGNOSIS — Z7952 Long term (current) use of systemic steroids: Secondary | ICD-10-CM

## 2017-08-07 DIAGNOSIS — E039 Hypothyroidism, unspecified: Secondary | ICD-10-CM | POA: Diagnosis present

## 2017-08-07 DIAGNOSIS — N179 Acute kidney failure, unspecified: Secondary | ICD-10-CM | POA: Diagnosis not present

## 2017-08-07 DIAGNOSIS — J9621 Acute and chronic respiratory failure with hypoxia: Secondary | ICD-10-CM | POA: Diagnosis not present

## 2017-08-07 DIAGNOSIS — Y842 Radiological procedure and radiotherapy as the cause of abnormal reaction of the patient, or of later complication, without mention of misadventure at the time of the procedure: Secondary | ICD-10-CM | POA: Diagnosis present

## 2017-08-07 DIAGNOSIS — I484 Atypical atrial flutter: Secondary | ICD-10-CM | POA: Diagnosis not present

## 2017-08-07 DIAGNOSIS — Z923 Personal history of irradiation: Secondary | ICD-10-CM

## 2017-08-07 DIAGNOSIS — I700 Atherosclerosis of aorta: Secondary | ICD-10-CM | POA: Diagnosis not present

## 2017-08-07 DIAGNOSIS — Z7951 Long term (current) use of inhaled steroids: Secondary | ICD-10-CM | POA: Diagnosis not present

## 2017-08-07 DIAGNOSIS — Z885 Allergy status to narcotic agent status: Secondary | ICD-10-CM

## 2017-08-07 DIAGNOSIS — R0602 Shortness of breath: Secondary | ICD-10-CM | POA: Diagnosis not present

## 2017-08-07 DIAGNOSIS — I4892 Unspecified atrial flutter: Secondary | ICD-10-CM | POA: Diagnosis not present

## 2017-08-07 DIAGNOSIS — I5033 Acute on chronic diastolic (congestive) heart failure: Secondary | ICD-10-CM | POA: Diagnosis not present

## 2017-08-07 DIAGNOSIS — J449 Chronic obstructive pulmonary disease, unspecified: Secondary | ICD-10-CM | POA: Diagnosis not present

## 2017-08-07 DIAGNOSIS — J939 Pneumothorax, unspecified: Secondary | ICD-10-CM | POA: Diagnosis not present

## 2017-08-07 DIAGNOSIS — I3139 Other pericardial effusion (noninflammatory): Secondary | ICD-10-CM | POA: Diagnosis present

## 2017-08-07 DIAGNOSIS — Z9221 Personal history of antineoplastic chemotherapy: Secondary | ICD-10-CM | POA: Diagnosis not present

## 2017-08-07 DIAGNOSIS — Z880 Allergy status to penicillin: Secondary | ICD-10-CM

## 2017-08-07 DIAGNOSIS — Z87891 Personal history of nicotine dependence: Secondary | ICD-10-CM

## 2017-08-07 HISTORY — PX: PERICARDIOCENTESIS: CATH118255

## 2017-08-07 LAB — BODY FLUID CELL COUNT WITH DIFFERENTIAL
EOS FL: 0 %
Lymphs, Fluid: 9 %
MONOCYTE-MACROPHAGE-SEROUS FLUID: 86 % (ref 50–90)
Neutrophil Count, Fluid: 5 % (ref 0–25)
WBC FLUID: 47 uL (ref 0–1000)

## 2017-08-07 LAB — GRAM STAIN

## 2017-08-07 LAB — MRSA PCR SCREENING: MRSA BY PCR: NEGATIVE

## 2017-08-07 SURGERY — PERICARDIOCENTESIS
Anesthesia: LOCAL

## 2017-08-07 MED ORDER — BUDESONIDE 0.5 MG/2ML IN SUSP
0.5000 mg | Freq: Two times a day (BID) | RESPIRATORY_TRACT | Status: DC
  Administered 2017-08-07 – 2017-08-08 (×3): 0.5 mg via RESPIRATORY_TRACT
  Filled 2017-08-07 (×3): qty 2

## 2017-08-07 MED ORDER — LIDOCAINE HCL (PF) 1 % IJ SOLN: INTRAMUSCULAR | Status: DC | PRN

## 2017-08-07 MED ORDER — AMLODIPINE BESYLATE 5 MG PO TABS
5.0000 mg | ORAL_TABLET | Freq: Every day | ORAL | Status: DC
  Administered 2017-08-08 – 2017-08-10 (×3): 5 mg via ORAL
  Filled 2017-08-07 (×3): qty 1

## 2017-08-07 MED ORDER — MORPHINE SULFATE (PF) 4 MG/ML IV SOLN
1.0000 mg | INTRAVENOUS | Status: DC | PRN
  Administered 2017-08-07 – 2017-08-08 (×2): 2 mg via INTRAVENOUS
  Filled 2017-08-07 (×2): qty 1

## 2017-08-07 MED ORDER — ASPIRIN EC 81 MG PO TBEC
81.0000 mg | DELAYED_RELEASE_TABLET | Freq: Every day | ORAL | Status: DC
  Administered 2017-08-07 – 2017-08-10 (×4): 81 mg via ORAL
  Filled 2017-08-07 (×4): qty 1

## 2017-08-07 MED ORDER — PANTOPRAZOLE SODIUM 40 MG PO TBEC
40.0000 mg | DELAYED_RELEASE_TABLET | Freq: Every day | ORAL | Status: DC
  Administered 2017-08-08 – 2017-08-10 (×3): 40 mg via ORAL
  Filled 2017-08-07 (×3): qty 1

## 2017-08-07 MED ORDER — ACETAMINOPHEN 325 MG PO TABS: 650.0000 mg | ORAL_TABLET | Freq: Four times a day (QID) | ORAL | Status: DC | PRN

## 2017-08-07 MED ORDER — FENTANYL CITRATE (PF) 100 MCG/2ML IJ SOLN
INTRAMUSCULAR | Status: AC
  Filled 2017-08-07: qty 2

## 2017-08-07 MED ORDER — METOPROLOL TARTRATE 5 MG/5ML IV SOLN
5.0000 mg | Freq: Four times a day (QID) | INTRAVENOUS | Status: DC | PRN
  Filled 2017-08-07: qty 5

## 2017-08-07 MED ORDER — HEPARIN (PORCINE) IN NACL 2-0.9 UNIT/ML-% IJ SOLN
INTRAMUSCULAR | Status: AC | PRN
  Administered 2017-08-07: 500 mL

## 2017-08-07 MED ORDER — PANTOPRAZOLE SODIUM 40 MG PO TBEC: 40.0000 mg | DELAYED_RELEASE_TABLET | Freq: Every day | ORAL | Status: DC

## 2017-08-07 MED ORDER — PREDNISONE 20 MG PO TABS
10.0000 mg | ORAL_TABLET | Freq: Every day | ORAL | Status: DC
  Administered 2017-08-08 – 2017-08-10 (×3): 10 mg via ORAL
  Filled 2017-08-07 (×3): qty 1

## 2017-08-07 MED ORDER — SODIUM CHLORIDE 0.9% FLUSH: 3.0000 mL | Freq: Two times a day (BID) | INTRAVENOUS | Status: DC

## 2017-08-07 MED ORDER — MIDAZOLAM HCL 2 MG/2ML IJ SOLN
INTRAMUSCULAR | Status: DC | PRN
  Administered 2017-08-07 (×2): 1 mg via INTRAVENOUS

## 2017-08-07 MED ORDER — SODIUM CHLORIDE 0.9 % IV SOLN: 250.0000 mL | INTRAVENOUS | Status: DC | PRN

## 2017-08-07 MED ORDER — LIDOCAINE HCL 2 % IJ SOLN
INTRAMUSCULAR | Status: DC | PRN
  Administered 2017-08-07: 20 mL

## 2017-08-07 MED ORDER — AMIODARONE HCL IN DEXTROSE 360-4.14 MG/200ML-% IV SOLN
30.0000 mg/h | INTRAVENOUS | Status: DC
  Administered 2017-08-08 – 2017-08-09 (×3): 30 mg/h via INTRAVENOUS
  Filled 2017-08-07 (×3): qty 200

## 2017-08-07 MED ORDER — ENSURE ENLIVE PO LIQD: 237.0000 mL | Freq: Two times a day (BID) | ORAL | Status: DC

## 2017-08-07 MED ORDER — SODIUM CHLORIDE 0.9% FLUSH: 3.0000 mL | INTRAVENOUS | Status: DC | PRN

## 2017-08-07 MED ORDER — AMIODARONE LOAD VIA INFUSION
150.0000 mg | Freq: Once | INTRAVENOUS | Status: AC
  Administered 2017-08-07: 150 mg via INTRAVENOUS
  Filled 2017-08-07: qty 83.34

## 2017-08-07 MED ORDER — ONDANSETRON HCL 4 MG/2ML IJ SOLN
4.0000 mg | Freq: Four times a day (QID) | INTRAMUSCULAR | Status: DC | PRN
  Administered 2017-08-08: 4 mg via INTRAVENOUS
  Filled 2017-08-07: qty 2

## 2017-08-07 MED ORDER — AMIODARONE HCL IN DEXTROSE 360-4.14 MG/200ML-% IV SOLN
60.0000 mg/h | INTRAVENOUS | Status: AC
  Administered 2017-08-07: 60 mg/h via INTRAVENOUS
  Filled 2017-08-07 (×2): qty 200

## 2017-08-07 MED ORDER — SODIUM CHLORIDE 0.9 % IV SOLN
INTRAVENOUS | Status: DC
  Administered 2017-08-07: 12:00:00 via INTRAVENOUS

## 2017-08-07 MED ORDER — FENTANYL CITRATE (PF) 100 MCG/2ML IJ SOLN
INTRAMUSCULAR | Status: DC | PRN
  Administered 2017-08-07 (×2): 50 ug via INTRAVENOUS

## 2017-08-07 MED ORDER — SODIUM CHLORIDE 0.9 % IV SOLN: INTRAVENOUS | Status: AC

## 2017-08-07 MED ORDER — KETOROLAC TROMETHAMINE 15 MG/ML IJ SOLN
15.0000 mg | Freq: Once | INTRAMUSCULAR | Status: AC
  Administered 2017-08-07: 15 mg via INTRAVENOUS
  Filled 2017-08-07: qty 1

## 2017-08-07 MED ORDER — SODIUM CHLORIDE 0.9% FLUSH
3.0000 mL | Freq: Two times a day (BID) | INTRAVENOUS | Status: DC
  Administered 2017-08-07 – 2017-08-09 (×4): 3 mL via INTRAVENOUS

## 2017-08-07 MED ORDER — LEVOTHYROXINE SODIUM 125 MCG PO TABS
125.0000 ug | ORAL_TABLET | Freq: Every day | ORAL | Status: DC
  Administered 2017-08-08 – 2017-08-10 (×3): 125 ug via ORAL
  Filled 2017-08-07 (×3): qty 1

## 2017-08-07 MED ORDER — ACETAMINOPHEN 325 MG PO TABS: 650.0000 mg | ORAL_TABLET | ORAL | Status: DC | PRN

## 2017-08-07 MED ORDER — METOPROLOL SUCCINATE ER 25 MG PO TB24
50.0000 mg | ORAL_TABLET | Freq: Two times a day (BID) | ORAL | Status: DC
  Administered 2017-08-07 – 2017-08-10 (×6): 50 mg via ORAL
  Filled 2017-08-07 (×6): qty 2

## 2017-08-07 MED ORDER — FENTANYL CITRATE (PF) 100 MCG/2ML IJ SOLN: 50.0000 ug | Freq: Once | INTRAMUSCULAR | Status: DC

## 2017-08-07 MED ORDER — VITAMIN D 1000 UNITS PO TABS: 1000.0000 [IU] | ORAL_TABLET | Freq: Every day | ORAL | Status: DC

## 2017-08-07 MED ORDER — HEPARIN (PORCINE) IN NACL 2-0.9 UNIT/ML-% IJ SOLN
INTRAMUSCULAR | Status: AC
  Filled 2017-08-07: qty 500

## 2017-08-07 MED ORDER — LIDOCAINE HCL 2 % IJ SOLN
INTRAMUSCULAR | Status: AC
  Filled 2017-08-07: qty 20

## 2017-08-07 MED ORDER — IPRATROPIUM-ALBUTEROL 0.5-2.5 (3) MG/3ML IN SOLN
3.0000 mL | Freq: Four times a day (QID) | RESPIRATORY_TRACT | Status: DC | PRN
  Administered 2017-08-08 – 2017-08-09 (×2): 3 mL via RESPIRATORY_TRACT
  Filled 2017-08-07 (×2): qty 3

## 2017-08-07 MED ORDER — MIDAZOLAM HCL 2 MG/2ML IJ SOLN
INTRAMUSCULAR | Status: AC
  Filled 2017-08-07: qty 2

## 2017-08-07 MED ORDER — ORAL CARE MOUTH RINSE
15.0000 mL | Freq: Two times a day (BID) | OROMUCOSAL | Status: DC
  Administered 2017-08-08 – 2017-08-09 (×3): 15 mL via OROMUCOSAL

## 2017-08-07 MED ORDER — VITAMIN D 1000 UNITS PO TABS
1000.0000 [IU] | ORAL_TABLET | Freq: Every day | ORAL | Status: DC
  Administered 2017-08-08 – 2017-08-10 (×3): 1000 [IU] via ORAL
  Filled 2017-08-07 (×3): qty 1

## 2017-08-07 MED ORDER — ATORVASTATIN CALCIUM 80 MG PO TABS: 80.0000 mg | ORAL_TABLET | Freq: Every day | ORAL | Status: DC

## 2017-08-07 MED ORDER — ATORVASTATIN CALCIUM 80 MG PO TABS
80.0000 mg | ORAL_TABLET | Freq: Every day | ORAL | Status: DC
  Administered 2017-08-08 – 2017-08-10 (×3): 80 mg via ORAL
  Filled 2017-08-07 (×3): qty 1

## 2017-08-07 SURGICAL SUPPLY — 6 items
COVER PRB 48X5XTLSCP FOLD TPE (BAG) ×1 IMPLANT
COVER PROBE 5X48 (BAG) ×2
EVACUATOR 1/8 PVC DRAIN (DRAIN) ×2 IMPLANT
PACK CARDIAC CATHETERIZATION (CUSTOM PROCEDURE TRAY) ×2 IMPLANT
PERIVAC PERICARDIOCENTESIS 8.3 (TRAY / TRAY PROCEDURE) ×2 IMPLANT
TRANSDUCER W/STOPCOCK (MISCELLANEOUS) ×2 IMPLANT

## 2017-08-07 NOTE — Consult Note (Signed)
Name: Renee Vance MRN: [id number] DOB: [birth year]    ADMISSION DATE:  08/07/2017 CONSULTATION DATE:  10/29  REFERRING MD :  Dr. Tamala Julian Cardiology  CHIEF COMPLAINT:  PTX L  HISTORY OF PRESENT ILLNESS:  76 year old female with PMH paroxysmal atrial fibrillation, coronary artery disease with drug-eluting stent in the distal RCA and intermediate stenosis in the LAD had cath in 2011, COPD, hyperlipidemia, obesity, and relatively recent diagnosis of squamous cell carcinoma of the left upper lobe s/p radiochemotherapy with good response. She is being considered for L upper lobectomy. She was found to have a large pericardial effusion on CT chest, which was confirmed on echo. She presented to [facility] 10/29 for elective percutaneous pericardial drain. Procedure complicated by L PTX. PCCM asked to evaluate. Currently she complains of chest pain with deep breathing related to pericardial drain. She does not feel dyspneic.   SIGNIFICANT EVENTS  10/29 present for pericardial drain > PTX > admit  STUDIES:  PCXR 10/29 >   PAST MEDICAL HISTORY :   has a past medical history of Antineoplastic chemotherapy induced anemia (09/26/2016); Bradycardia; CAD (coronary artery disease); Cellulitis of right forearm (05/10/2017); COPD (chronic obstructive pulmonary disease) ([place]); Coronary atherosclerosis of native coronary artery (2011); Encounter for antineoplastic chemotherapy (06/23/2016); Fibrocystic breast; History of radiation therapy (07/05/16 - 08/09/16); HTN (hypertension); Hyperlipidemia; Hypothyroidism; Neutropenic fever ([place]) (03/15/2017); Obesity; Pain in joint; Right renal mass (11/30/2016); 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  has a past surgical history that includes Cholecystectomy; Tubal ligation; Coronary angioplasty with stent (2011); Video bronchoscopy (Bilateral, 06/07/2016); IR FLUORO GUIDE PORT INSERTION RIGHT (04/07/2017); and IR US  Guide Vasc Access Right (04/07/2017). Prior to Admission medications   Medication Sig Start Date End Date Taking? Authorizing Provider  amLODipine (NORVASC) 5 MG tablet Take 5 mg by mouth daily.   Yes [provider]  aspirin 81 MG tablet Take 81 mg by mouth daily.   Yes [provider]  atorvastatin (LIPITOR) 80 MG tablet Take 1 tablet (80 mg total) by mouth daily. 07/31/17  Yes [name], MD  budesonide (PULMICORT) 0.5 MG/2ML nebulizer solution Take 2 mLs (0.5 mg total) by nebulization 2 (two) times daily. 06/19/17  Yes [name], MD  feeding supplement (BOOST HIGH PROTEIN) LIQD Take 1 Container by mouth daily.   Yes [provider]  furosemide (LASIX) 40 MG tablet Take 1 tablet (40 mg total) by mouth daily. 07/31/17  Yes [name], MD  ipratropium-albuterol (DUONEB) 0.5-2.5 (3) MG/3ML SOLN Take 3 mLs by nebulization every 6 (six) hours as needed. 06/30/17  Yes [name], MD  levothyroxine (SYNTHROID, LEVOTHROID) 125 MCG tablet Take 125 mcg by mouth daily before breakfast.  02/26/15  Yes [provider]  metoprolol succinate (TOPROL-XL) 50 MG 24 hr tablet TAKE ONE TABLET BY MOUTH TWICE DAILY 05/01/17  Yes Belva Crome, MD  Multiple Vitamins-Iron (MULTIVITAMIN/IRON PO) Take 1 tablet by mouth daily.   Yes [provider]  omeprazole (PRILOSEC) 20 MG capsule Take 30- 60 min before your first and last meals of the day 06/09/17  Yes Tanda Rockers, MD  OXYGEN Inhale 2 L into the lungs continuous.   Yes [provider]  predniSONE (DELTASONE) 10 MG tablet Take 1 tablet (10 mg total) by mouth daily with breakfast. 07/31/17  Yes [name], MD  Vitamin D, Cholecalciferol, 1000 UNITS CAPS Take 1,000 Units by mouth daily.  08/25/14  Yes [provider]  acetaminophen (TYLENOL) 325 MG tablet Take 650 mg by mouth every 6 (six) hours as needed.    [provider]  lidocaine-prilocaine (EMLA) cream Apply 1  application topically as needed. 04/14/17   [name], MD   Allergies  Allergen Reactions  . Ampicillin Nausea And Vomiting    Has patient had a PCN reaction causing immediate rash, facial/tongue/throat swelling, SOB or lightheadedness with hypotension: no Has patient had a PCN reaction causing severe rash involving mucus membranes or skin necrosis: no Has patient had a PCN reaction that required hospitalization: no Has patient had a PCN reaction occurring within the last 10 years: no If all of the above answers are "NO", then may proceed with Cephalosporin use  . Codeine Nausea And Vomiting    FAMILY HISTORY:  family history includes Heart disease in her mother. SOCIAL HISTORY:  reports that she quit smoking about 3 years ago. She has a 30.00 pack-year smoking history. She has never used smokeless tobacco. She reports that she does not drink alcohol or use drugs.  REVIEW OF SYSTEMS:   Bolds are positive  Constitutional: weight loss, gain, night sweats, Fevers, chills, fatigue .  HEENT: headaches, Sore throat, sneezing, nasal congestion, post nasal drip, Difficulty swallowing, Tooth/dental problems, visual complaints visual changes, ear ache CV:  chest pain at the level of xyphoid process related to pericardial drain, radiates: ,Orthopnea, PND, swelling in lower extremities, dizziness, palpitations, syncope.  GI  heartburn, indigestion, abdominal pain, nausea, vomiting, diarrhea, change in bowel habits, loss of appetite, bloody stools.  Resp: cough, productive:, hemoptysis, dyspnea, chest pain, pleuritic.  Skin: rash or itching or icterus GU: dysuria, change in color of urine, urgency or frequency. flank pain, hematuria  MS: joint pain or swelling. decreased range of motion  Psych: change in mood or affect. depression or anxiety.  Neuro: difficulty with speech, weakness, numbness, ataxia    SUBJECTIVE:   VITAL SIGNS: Temp:  [97.7 F (36.5 C)] 97.7 F (36.5 C) (10/29  1131) Pulse Rate:  [106-126] 106 (10/29 1228) BP: (111)/(81) 111/81 (10/29 1131) SpO2:  [91 %] 91 % (10/29 1131) Weight:  [64.4 kg (142 lb)] 64.4 kg (142 lb) (10/29 1131)  PHYSICAL EXAMINATION: General:  Elderly female of normal body habitus in NAD Neuro:  Alert, oriented, non-focal HEENT:  [place]/AT, PERRL, no JVD Cardiovascular:  Tachy, regular, no MRG Lungs: clear bilateral breath sounds Abdomen:  Soft, non-tender, non-distended Musculoskeletal:  No acute deformity or ROM limitation Skin:  Grossly intact   Recent Labs Lab 08/04/17 1554  NA 140  K 4.5  CL 93*  CO2 31*  BUN 36*  CREATININE 1.48*  GLUCOSE 135*    Recent Labs Lab 08/04/17 1554  HGB 10.1*  HCT 32.8*  WBC 7.5  PLT 160   No results found.  ASSESSMENT / PLAN:  76 year old female presented 10/29 for elective pericardial drain placement which was thought to be complicated by Left sided ptx on fluoro after drain was placed. STAT CXR does not support this diagnosis. Volume loss on CXR occupies same distribution as apparent PTX on fluoro. No need for intervention from pulmonary standpoint.   Pericardial effusion - drain placed 10/29 - per cardiology  Chronic respiratory failure - Continue home O2  Stage IV Squamous cell carcinoma - F/u CT in November  Radiation pneumonitis - continue prednisone 10mg  daily - Continue home budesonide and PRN duoneb  PCCM will sign off  Georgann Housekeeper, AGACNP-BC [facility] Pulmonology/Critical Care Pager  [phone] or [phone]  08/07/2017 4:13 PM

## 2017-08-07 NOTE — Interval H&P Note (Signed)
Cath Lab Visit (complete for each Cath Lab visit)  Clinical Evaluation Leading to the Procedure:   ACS: No.  Non-ACS:    Anginal Classification: CCS IV  Anti-ischemic medical therapy: No Therapy  Non-Invasive Test Results: No non-invasive testing performed  Prior CABG: No previous CABG      History and Physical Interval Note:  08/07/2017 2:54 PM  [name]  has presented today for surgery, with the diagnosis of pericardial effusion  The various methods of treatment have been discussed with the patient and family. After consideration of risks, benefits and other options for treatment, the patient has consented to  Procedure(s): PERICARDIOCENTESIS (N/A) as a surgical intervention .  The patient's history has been reviewed, patient examined, no change in status, stable for surgery.  I have reviewed the patient's chart and labs.  Questions were answered to the patient's satisfaction.     [name]

## 2017-08-07 NOTE — Progress Notes (Signed)
  Amiodarone Drug - Drug Interaction Consult Note  Recommendations: Monitor for possible side effects.  Amiodarone is metabolized by the cytochrome P450 system and therefore has the potential to cause many drug interactions. Amiodarone has an average plasma half-life of 50 days (range 20 to 100 days).   There is potential for drug interactions to occur several weeks or months after stopping treatment and the onset of drug interactions may be slow after initiating amiodarone.   [x]  Statins: Increased risk of myopathy. Simvastatin- restrict dose to 20mg  daily. Other statins: counsel patients to report any muscle pain or weakness immediately.  []  Anticoagulants: Amiodarone can increase anticoagulant effect. Consider warfarin dose reduction. Patients should be monitored closely and the dose of anticoagulant altered accordingly, remembering that amiodarone levels take several weeks to stabilize.  []  Antiepileptics: Amiodarone can increase plasma concentration of phenytoin, the dose should be reduced. Note that small changes in phenytoin dose can result in large changes in levels. Monitor patient and counsel on signs of toxicity.  [x]  Beta blockers: increased risk of bradycardia, AV block and myocardial depression. Sotalol - avoid concomitant use.  []   Calcium channel blockers (diltiazem and verapamil): increased risk of bradycardia, AV block and myocardial depression.  []   Cyclosporine: Amiodarone increases levels of cyclosporine. Reduced dose of cyclosporine is recommended.  []  Digoxin dose should be halved when amiodarone is started.  []  Diuretics: increased risk of cardiotoxicity if hypokalemia occurs.  []  Oral hypoglycemic agents (glyburide, glipizide, glimepiride): increased risk of hypoglycemia. Patient's glucose levels should be monitored closely when initiating amiodarone therapy.   []  Drugs that prolong the QT interval:  Torsades de pointes risk may be increased with concurrent use -  avoid if possible.  Monitor QTc, also keep magnesium/potassium WNL if concurrent therapy can't be avoided. Marland Kitchen Antibiotics: e.g. fluoroquinolones, erythromycin. . Antiarrhythmics: e.g. quinidine, procainamide, disopyramide, sotalol. . Antipsychotics: e.g. phenothiazines, haloperidol.  . Lithium, tricyclic antidepressants, and methadone.  Thank You,   Renold Genta, PharmD, BCPS Clinical Pharmacist Phone for today - [place] - [phone] 08/07/2017 6:30 PM

## 2017-08-07 NOTE — H&P (View-Only) (Signed)
Cardiology Office Note    Date:  08/04/2017   ID:  Renee Vance, DOB 1941-04-15, MRN [id number]  PCP:  [name], MD  Cardiologist: Sinclair Grooms, MD   Chief Complaint  Patient presents with  . Follow-up    Large Pericardial effusion    History of Present Illness:  Renee Vance is a 76 y.o. female with cardiac history of paroxysmal atrial fibrillation, coronary artery disease with drug-eluting stent in the distal RCA and intermediate stenosis in the LAD had cath in 2011, COPD, hyperlipidemia, obesity, and relatively recent diagnosis of squamous cell carcinoma of the left upper lobe. She is status post radiochemotherapy with good response. She is now being considered for left upper lobectomy. FEV1 is 70%.  She returns today after we performed echocardiography to identify severity of pericardial effusion noted on recent chest CT scan. Over the past 3-6 months to paracardial effusion has been noted and on the last CT was considered large. Echocardiography demonstrated a large effusion with right ventricular inversion but no true echocardiographic evidence of tamponade. I spoke with Dr. Elsworth Soho, and we both agree that the next clinical move would be to drain the pericardial effusion percutaneously. She is on continuous oxygen therapy and a pericardial window which would require intubation could be an issue that would increase risk for pulmonary complications.   Past Medical History:  Diagnosis Date  . 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 Bradycardia   . CAD (coronary artery disease)   . Cellulitis of right forearm 05/10/2017  . COPD (chronic obstructive pulmonary disease) ([place])   . Coronary atherosclerosis of native coronary artery 2011  . Encounter for antineoplastic chemotherapy 06/23/2016  . Fibrocystic breast   . History of radiation therapy 07/05/16 - 08/09/16    Left Lung: 45 Gy in 25 fractions  . HTN (hypertension)   . Hyperlipidemia   .  Hypothyroidism   . Neutropenic fever ([place]) 03/15/2017  . Obesity   . Pain in joint   . Right renal mass 11/30/2016  . 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 Thrombocytopenia ([place]) 03/15/2017    Past Surgical History:  Procedure Laterality Date  . CHOLECYSTECTOMY    . CORONARY ANGIOPLASTY WITH STENT PLACEMENT  2011   [place] 30-40%, LAD 65-75% (FFR 0.88), CFX 55-60%, RCA 95%>0 w/ 2.5 x 12 mm monorail stent  . IR FLUORO GUIDE PORT INSERTION RIGHT  04/07/2017  . IR US GUIDE VASC ACCESS RIGHT  04/07/2017  . TUBAL LIGATION    . VIDEO BRONCHOSCOPY Bilateral 06/07/2016   Procedure: VIDEO BRONCHOSCOPY WITH FLUORO;  Surgeon: [name], MD;  Location: [facility];  Service: Cardiopulmonary;  Laterality: Bilateral;    Current Medications: Outpatient Medications Prior to Visit  Medication Sig Dispense Refill  . acetaminophen (TYLENOL) 325 MG tablet Take 650 mg by mouth every 6 (six) hours as needed.    Marland Kitchen amLODipine (NORVASC) 5 MG tablet Take 5 mg by mouth daily.    Marland Kitchen aspirin 81 MG tablet Take 81 mg by mouth daily.    Marland Kitchen atorvastatin (LIPITOR) 80 MG tablet Take 1 tablet (80 mg total) by mouth daily. 90 tablet 1  . budesonide (PULMICORT) 0.5 MG/2ML nebulizer solution Take 2 mLs (0.5 mg total) by nebulization 2 (two) times daily. 120 mL 3  . feeding supplement (BOOST HIGH PROTEIN) LIQD Take 1 Container by mouth daily.    . furosemide (LASIX) 40 MG tablet Take 1 tablet (40 mg  total) by mouth daily. 10 tablet 0  . ipratropium-albuterol (DUONEB) 0.5-2.5 (3) MG/3ML SOLN Take 3 mLs by nebulization every 6 (six) hours as needed. 360 mL 0  . levothyroxine (SYNTHROID, LEVOTHROID) 125 MCG tablet Take 125 mcg by mouth daily before breakfast.     . lidocaine-prilocaine (EMLA) cream Apply 1 application topically as needed. 30 g 0  . metoprolol succinate (TOPROL-XL) 50 MG 24 hr tablet TAKE ONE TABLET BY MOUTH TWICE DAILY 180 tablet 3  . Multiple Vitamins-Iron (MULTIVITAMIN/IRON PO) Take 1  tablet by mouth daily.    . Multiple Vitamins-Minerals (MULTIVITAMIN WITH MINERALS) tablet Take 1 tablet by mouth daily.    Marland Kitchen omeprazole (PRILOSEC) 20 MG capsule Take 30- 60 min before your first and last meals of the day 60 capsule 2  . OXYGEN Inhale 2 L into the lungs continuous.    . predniSONE (DELTASONE) 10 MG tablet Take 1 tablet (10 mg total) by mouth daily with breakfast. 20 tablet 0  . Vitamin D, Cholecalciferol, 1000 UNITS CAPS Take 1,000 Units by mouth daily.      No facility-administered medications prior to visit.      Allergies:   Ampicillin and Codeine   Social History   Social History  . Marital status: Married    Spouse name: N/A  . Number of children: N/A  . Years of education: N/A   Occupational History  . retired    Social History Main Topics  . Smoking status: Former Smoker    Packs/day: 0.50    Years: 60.00    Quit date: 04/10/2014  . Smokeless tobacco: Never Used     Comment: june 2015  . Alcohol use No  . Drug use: No  . Sexual activity: Not Currently   Other Topics Concern  . None   Social History Narrative  . None     Family History:  The patient's family history includes Heart disease in her mother.   ROS:   Please see the history of present illness.    Progressive shortness of breath, worse over the past month.  All other systems reviewed and are negative.   PHYSICAL EXAM:   VS:  BP (!) 146/70   Pulse 78   Ht 5\' 4"  (1.626 m)   Wt 142 lb 1.9 oz (64.5 kg)   SpO2 91%   BMI 24.39 kg/m    GEN: Well nourished, well developed, in no acute distress . Pulses paradoxus is not identified. HEENT: normal  Neck: no significant JVD, carotid bruits, or masses Cardiac: RRR; no murmurs, rubs, or gallops,no edema  Respiratory:  clear to auscultation bilaterally, normal work of breathing GI: soft, nontender, nondistended, + BS MS: no deformity or atrophy  Skin: warm and dry, no rash Neuro:  Alert and Oriented x 3, Strength and sensation are  intact Psych: euthymic mood, full affect  Wt Readings from Last 3 Encounters:  08/04/17 142 lb 1.9 oz (64.5 kg)  07/31/17 147 lb 9.6 oz (67 kg)  07/27/17 147 lb 6.4 oz (66.9 kg)      Studies/Labs Reviewed:   EKG:  EKG  Not repeated.  Recent Labs: 10/30/2016: B Natriuretic Peptide 963.1 03/17/2017: TSH 0.619 03/21/2017: Magnesium 1.6 05/22/2017: ALT 28 06/20/2017: BUN 40; Creatinine, Ser 1.75; Hemoglobin 8.4 Repeated and verified X2.; Platelets 251.0; Potassium 4.4; Sodium 138   Lipid Panel No results found for: CHOL, TRIG, HDL, CHOLHDL, VLDL, LDLCALC, LDLDIRECT  Additional studies/ records that were reviewed today include:  Study Conclusions   -  Left ventricle: The cavity size was normal. There was mild   concentric hypertrophy. Systolic function was normal. The   estimated ejection fraction was in the range of 60% to 65%. Wall   motion was normal; there were no regional wall motion   abnormalities. Doppler parameters are consistent with abnormal   left ventricular relaxation (grade 1 diastolic dysfunction).   Doppler parameters are consistent with high ventricular filling   pressure. - Aortic valve: Mildly calcified annulus. Trileaflet; mildly   calcified leaflets. - Mitral valve: There was trivial regurgitation. - Right atrium: Central venous pressure (est): 15 mm Hg. - Tricuspid valve: There was trivial regurgitation. - Pulmonary arteries: PA peak pressure: 24 mm Hg (S). - Pericardium, extracardiac: A large pericardial effusion was   identified circumferential to the heart. There is evidence of   chronicity and organization with echodense material surrounding   the LV and RV. There is moderate undulation of the right atrial   free wall with compression, swinging of the heart within the   effusion, potential compression of the RV seen in the subcostal   views although not seen in parasternal or apical views   specifically. Mitral inflow not gated to respiration, but  there   does look to be variation which suggests LV and RV   interdependence. Taken together study consistent with early   tamponade physiology and a slowly enlarging effusion. Patient in   no distress at time of the study. Blood pressure 129/72, heart   rate around 70. Results called to Dr. Tamala Julian.   Impressions:   - A large pericardial effusion was identified circumferential to   the heart. There is evidence of chronicity and organization with   echodense material surrounding the LV and RV. There is moderate   undulation of the right atrial free wall with compression,   swinging of the heart within the effusion, potential compression   of the RV seen in the subcostal views although not seen in   parasternal or apical views specifically. Mitral inflow not gated   to respiration, but there does look to be variation which   suggests LV and RV interdependence. Taken together study   consistent with early tamponade physiology and a slowly enlarging   effusion. Patient in no distress at time of the study. Blood   pressure 129/72, heart rate around 70. Results called to Dr.   Tamala Julian.     ASSESSMENT:    1. Pericardial effusion   2. PAF (paroxysmal atrial fibrillation) (HCC)   3. Stage IV squamous cell carcinoma of left lung ([place])   4. Chronic respiratory failure with hypoxia (HCC)   5. Coronary artery disease involving native coronary artery of native heart with angina pectoris ([place])      PLAN:  In order of problems listed above:  1. Large pericardial effusion without clinical evidence of pericardial tap and not. The clinical situation suggests a malignant pericardial effusion in the setting of stage IV squamous cell carcinoma of the lung. After speaking with Dr. Elsworth Soho, percutaneous pericardial drainage will be arranged for this coming Monday. The procedure and risks were discussed with the patient and husband in detail. Risks include pneumothorax, hepatic laceration, cardiac  laceration, bleeding, arrhythmia, death, myocardial infarction,and respiratory failure. The patient understands the risks and potential benefits (diagnosis,prognosis, and improvement in respiratory status). 2. Currently in normal sinus rhythm 3. Addressed above under paracardial effusion and the likelihood of malignancy invading the paracardial. 4. Continue O2 therapy. Attempting to avoid intubation related  to surgical pericardial window.      Medication Adjustments/Labs and Tests Ordered: Current medicines are reviewed at length with the patient today.  Concerns regarding medicines are outlined above.  Medication changes, Labs and Tests ordered today are listed in the Patient Instructions below. Patient Instructions  Medication Instructions:  Your physician recommends that you continue on your current medications as directed. Please refer to the Current Medication list given to you today.  Labwork: BMET, CBC and INR today  Testing/Procedures: Your physician recommends that you have a Pericardiocentesis.   Follow-Up: Your physician recommends that you schedule a follow-up appointment in 1 week after your procedure.    Any Other Special Instructions Will Be Listed Below (If Applicable).    [facility] OFFICE [address] [place] [address] Dept: [phone] Loc: [place]  08/04/2017  You are scheduled for a Pericardiocentesis on Monday, October 29 with Dr. Daneen Schick.  1. Please arrive at the [facility] (Main Entrance A) at [facility]: [address] [place], [place] [address] at 11:30 AM (two hours before your procedure to ensure your preparation). Free valet parking service is available.   Special note: Every effort is made to have your procedure done on time. Please understand that emergencies sometimes delay scheduled procedures.  2. Diet: Do  not eat or drink anything after midnight prior to your procedure except sips of water to take medications.  3. Labs: Will be drawn today  4. Medication instructions in preparation for your procedure:  Do not take your Aspirin or Furosemide the morning of your procedure.   On the morning of your procedure, take your  any morning medicines NOT listed above.  You may use sips of water.  5. Plan for one night stay--bring personal belongings. 6. Bring a current list of your medications and current insurance cards. 7. You MUST have a responsible person to drive you home. 8. Someone MUST be with you the first 24 hours after you arrive home or your discharge will be delayed. 9. Please wear clothes that are easy to get on and off and wear slip-on shoes.  Thank you for allowing [place] to care for you!   -- [facility]    If you need a refill on your cardiac medications before your next appointment, please call your pharmacy.      Signed, Sinclair Grooms, MD  08/04/2017 5:34 PM    [facility] [place], [place], [place]  [address] Phone: [phone]; Fax: [phone]

## 2017-08-07 NOTE — Progress Notes (Signed)
Pt HR 120-130's sustaining. MD paged to make aware. Erin NP returned call. Will place orders. Will contiue to monitor.

## 2017-08-07 NOTE — Interval H&P Note (Signed)
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  Upon check-in today her heart rate was 145 with EKG appearance of atrial flutter.  This is new since being seen 72 hours ago.  She is hemodynamically stable but more short of breath than previously.  Plan pericardial drain, IV amiodarone, and ultimately cardioversion if required. Renee Vance  has presented today for surgery, with the diagnosis of pericardial effusion  The various methods of treatment have been discussed with the patient and family. After consideration of risks, benefits and other options for treatment, the patient has consented to  Procedure(s): PERICARDIOCENTESIS (N/A) as a surgical intervention .  The patient's history has been reviewed, patient examined, no change in status, stable for surgery.  I have reviewed the patient's chart and labs.  Questions were answered to the patient's satisfaction.     Renee Vance

## 2017-08-08 ENCOUNTER — Encounter (HOSPITAL_COMMUNITY): Payer: Self-pay | Admitting: Interventional Cardiology

## 2017-08-08 ENCOUNTER — Inpatient Hospital Stay (HOSPITAL_COMMUNITY): Payer: PPO

## 2017-08-08 DIAGNOSIS — I484 Atypical atrial flutter: Secondary | ICD-10-CM

## 2017-08-08 DIAGNOSIS — I313 Pericardial effusion (noninflammatory): Secondary | ICD-10-CM

## 2017-08-08 LAB — COMPREHENSIVE METABOLIC PANEL
ALK PHOS: 58 U/L (ref 38–126)
ALT: 61 U/L — AB (ref 14–54)
ANION GAP: 6 (ref 5–15)
AST: 76 U/L — ABNORMAL HIGH (ref 15–41)
Albumin: 2.5 g/dL — ABNORMAL LOW (ref 3.5–5.0)
BILIRUBIN TOTAL: 0.8 mg/dL (ref 0.3–1.2)
BUN: 48 mg/dL — ABNORMAL HIGH (ref 6–20)
CALCIUM: 8.6 mg/dL — AB (ref 8.9–10.3)
CO2: 31 mmol/L (ref 22–32)
CREATININE: 1.81 mg/dL — AB (ref 0.44–1.00)
Chloride: 100 mmol/L — ABNORMAL LOW (ref 101–111)
GFR, EST AFRICAN AMERICAN: 30 mL/min — AB (ref >60)
GFR, EST NON AFRICAN AMERICAN: 26 mL/min — AB (ref >60)
Glucose, Bld: 117 mg/dL — ABNORMAL HIGH (ref 65–99)
Potassium: 4.5 mmol/L (ref 3.5–5.1)
Sodium: 137 mmol/L (ref 135–145)
Total Protein: 5.7 g/dL — ABNORMAL LOW (ref 6.5–8.1)

## 2017-08-08 LAB — CBC
HCT: 30.8 % — ABNORMAL LOW (ref 36.0–46.0)
HEMOGLOBIN: 9.3 g/dL — AB (ref 12.0–15.0)
MCH: 30.8 pg (ref 26.0–34.0)
MCHC: 30.2 g/dL (ref 30.0–36.0)
MCV: 102 fL — AB (ref 78.0–100.0)
Platelets: 128 10*3/uL — ABNORMAL LOW (ref 150–400)
RBC: 3.02 MIL/uL — AB (ref 3.87–5.11)
RDW: 18.7 % — ABNORMAL HIGH (ref 11.5–15.5)
WBC: 6.1 10*3/uL (ref 4.0–10.5)

## 2017-08-08 LAB — LD, BODY FLUID (OTHER): LD, BODY FLUID: 236 IU/L

## 2017-08-08 LAB — PROTEIN, BODY FLUID (OTHER): TOTAL PROTEIN, BODY FLUID OTHER: 4.9 g/dL

## 2017-08-08 LAB — GLUCOSE, BODY FLUID OTHER: GLUCOSE, BODY FLUID OTHER: 121 mg/dL

## 2017-08-08 MED ORDER — BUDESONIDE 0.5 MG/2ML IN SUSP
0.5000 mg | Freq: Two times a day (BID) | RESPIRATORY_TRACT | Status: DC
  Administered 2017-08-09 – 2017-08-10 (×3): 0.5 mg via RESPIRATORY_TRACT
  Filled 2017-08-08 (×3): qty 2

## 2017-08-08 MED ORDER — BOOST PLUS PO LIQD
237.0000 mL | Freq: Every day | ORAL | Status: DC
  Administered 2017-08-09: 237 mL via ORAL
  Filled 2017-08-08 (×5): qty 237

## 2017-08-08 MED ORDER — APIXABAN 5 MG PO TABS
5.0000 mg | ORAL_TABLET | Freq: Two times a day (BID) | ORAL | Status: DC
  Administered 2017-08-08 – 2017-08-10 (×4): 5 mg via ORAL
  Filled 2017-08-08 (×4): qty 1

## 2017-08-08 NOTE — Progress Notes (Signed)
  Echocardiogram 2D Echocardiogram has been performed.  Coraline Talwar G Piedad Standiford 08/08/2017, 2:09 PM

## 2017-08-08 NOTE — Progress Notes (Signed)
Dr. Burt Knack at bedside pulled pericardial drain. Pt tolerated well. PRN pain medication given. Vitals stable will continue to monitor.

## 2017-08-08 NOTE — Progress Notes (Signed)
   No evidence of pneumothorax on chest x-ray.  The patient was in atrial flutter when she presented for pericardiocentesis yesterday.  Blood pressures/hemodynamics were stable.  IV amiodarone was started post procedure.  She has a prior history of brief atrial fibrillation noted on 30-day monitor.  She decided against anticoagulation at that time.  Atrial flutter has now been present perhaps as long as 96 hours or as short as 36 hours.  Recommend pleural pericardial drain today.  Start anticoagulation.  Continue amiodarone.  Cardioversion is she does not have pharmacologic conversion to discharge.

## 2017-08-08 NOTE — Progress Notes (Signed)
Progress Note  Patient Name: Renee Vance Date of Encounter: 08/08/2017  Primary Cardiologist: Tamala Julian  Subjective   The patient feels better today after her pericardiocentesis yesterday.  She has some pleuritic chest discomfort on the left.  Her breathing is improved.  She states her leg swelling is also improved.  Inpatient Medications    Scheduled Meds: . amLODipine  5 mg Oral Daily  . apixaban  5 mg Oral BID  . aspirin EC  81 mg Oral Daily  . atorvastatin  80 mg Oral Daily  . budesonide  0.5 mg Nebulization BID  . cholecalciferol  1,000 Units Oral Daily  . lactose free nutrition  237 mL Oral Daily  . levothyroxine  125 mcg Oral QAC breakfast  . mouth rinse  15 mL Mouth Rinse BID  . metoprolol succinate  50 mg Oral BID  . pantoprazole  40 mg Oral Daily  . predniSONE  10 mg Oral Q breakfast  . sodium chloride flush  3 mL Intravenous Q12H   Continuous Infusions: . sodium chloride    . amiodarone 30 mg/hr (08/08/17 0713)   PRN Meds: sodium chloride, acetaminophen, acetaminophen, ipratropium-albuterol, morphine injection, ondansetron (ZOFRAN) IV, sodium chloride flush   Vital Signs    Vitals:   08/08/17 1207 08/08/17 1230 08/08/17 1400 08/08/17 1500  BP: 108/83 115/84 115/73 112/69  Pulse: (!) 131 64 (!) 30 (!) 104  Resp:      Temp:      TempSrc:      SpO2: 100% 97% 100% 100%  Weight:      Height:        Intake/Output Summary (Last 24 hours) at 08/08/17 1555 Last data filed at 08/08/17 1500  Gross per 24 hour  Intake           2155.5 ml  Output             1285 ml  Net            870.5 ml   Filed Weights   08/07/17 1131 08/07/17 1608  Weight: 142 lb (64.4 kg) 137 lb 9.1 oz (62.4 kg)    Telemetry   Atrial flutter with 2: 1 block, heart rate 130 bpm- Personally Reviewed  Physical Exam  Elderly woman in no distress GEN: No acute distress.   Neck: No JVD Cardiac:  Tachycardic and regular, no murmurs, rubs, or gallops.  Respiratory: Clear to  auscultation bilaterally. GI: Soft, nontender, non-distended  MS:  1+ right ankle edema; No deformity. Neuro:  Nonfocal  Psych: Normal affect   Labs    Chemistry Recent Labs Lab 08/04/17 1554 08/08/17 0239  NA 140 137  K 4.5 4.5  CL 93* 100*  CO2 31* 31  GLUCOSE 135* 117*  BUN 36* 48*  CREATININE 1.48* 1.81*  CALCIUM 9.3 8.6*  PROT  --  5.7*  ALBUMIN  --  2.5*  AST  --  76*  ALT  --  61*  ALKPHOS  --  58  BILITOT  --  0.8  GFRNONAA 34* 26*  GFRAA 39* 30*  ANIONGAP  --  6     Hematology Recent Labs Lab 08/04/17 1554 08/08/17 0239  WBC 7.5 6.1  RBC 3.26* 3.02*  HGB 10.1* 9.3*  HCT 32.8* 30.8*  MCV 101* 102.0*  MCH 31.0 30.8  MCHC 30.8* 30.2  RDW 19.4* 18.7*  PLT 160 128*    Cardiac EnzymesNo results for input(s): TROPONINI in the last 168 hours. No results for  input(s): TROPIPOC in the last 168 hours.   BNPNo results for input(s): BNP, PROBNP in the last 168 hours.   DDimer No results for input(s): DDIMER in the last 168 hours.   Radiology    Dg Chest Port 1 View  Result Date: 08/07/2017 CLINICAL DATA:  Left-sided pneumothorax follow-up study EXAM: PORTABLE CHEST 1 VIEW COMPARISON:  Chest x-ray of July 31, 2017. FINDINGS: The right lung is well-expanded. The interstitial markings are mildly prominent though stable. On the left there is persistent volume loss. Pleural fluid or pleural thickening is present along the lateral aspect of the left upper lobe. The small caliber chest tube tip projects over the posteromedial aspect of the left seventh rib. There is calcification in the wall of the aortic arch. The power port catheter tip projects over the midportion of the SVC. The cardiac silhouette is mildly enlarged but stable. The left heart border remains obscured. IMPRESSION: No evidence of a pneumothorax. Stable volume loss on the left which may reflect pleural thickening or bite or loculated pleural fluid. New small caliber left-sided chest tube.  Cardiomegaly without pulmonary vascular congestion. Electronically Signed   By: David  Martinique M.D.   On: 08/07/2017 17:04    Patient Profile     76 y.o. female with lung cancer who has developed a large pericardial effusion and is admitted after pericardiocentesis 08/07/2017  Assessment & Plan    1.  Pericardial effusion: The patient has undergone successful pericardiocentesis in the cardiac catheterization lab by Dr. Tamala Julian.  I have reviewed her echo images from today and this demonstrates near resolution of her pericardial effusion.  There is a very small residual effusion compared to what was previously a large circumferential effusion.  She has had very little drainage from the pericardial tube today.  The patient is administered 2 mg of IV morphine at the bedside and her pericardial drain is removed without complication.  2.  Atrial flutter with RVR: The patient is tolerating IV amiodarone but remains in atrial flutter with a heart rate of 130 bpm.  We will start apixaban tonight and anticipate TEE/cardioversion Thursday pending availability.  For questions or updates, please contact [place] Please consult [email or web address] for contact info under Cardiology/STEMI.     Deatra James, MD  08/08/2017, 3:55 PM

## 2017-08-08 NOTE — Care Management Note (Addendum)
Case Management Note  Patient Details  Name: Renee Vance MRN: [id number] Date of Birth: 01-11-1941  Subjective/Objective:    From home with spouse, presents with pericardial effusion, s/p  Pericardiocentesis 10/29, now with aflutter with rvr on iv amio, will start eliquis tonight , then plan cardioversion on Thursday.  NCM awaiting benefit check for eliquis.   10/31 [place], BSN- NCM gave patient the 30 day free coupon for eliquis and her co pay amt of 45.00 with preferred pharmacy of [facility].                 Action/Plan: NCM will follow for dc needs.   Expected Discharge Date:                  Expected Discharge Plan:     In-House Referral:     Discharge planning Services  CM Consult  Post Acute Care Choice:    Choice offered to:     DME Arranged:    DME Agency:     HH Arranged:    HH Agency:     Status of Service:  In process, will continue to follow  If discussed at Long Length of Stay Meetings, dates discussed:    Additional Comments:  Zenon Mayo, RN 08/08/2017, 4:13 PM

## 2017-08-08 NOTE — Progress Notes (Signed)
Initial Nutrition Assessment  DOCUMENTATION CODES:   Non-severe (moderate) malnutrition in context of chronic illness  INTERVENTION:   -Boost Plus daily  -Snacks between meals (encouraged smaller, more frequent meals)   NUTRITION DIAGNOSIS:   Moderate Malnutrition related to cancer and cancer related treatments, chronic illness (COPD, cancer) as evidenced by percent weight loss, mild fat depletion, mild muscle depletion.  GOAL:   Patient will meet greater than or equal to 90% of their needs  MONITOR:   PO intake, Supplement acceptance, Labs, Weight trends  REASON FOR ASSESSMENT:   Malnutrition Screening Tool    ASSESSMENT:   76 yo female present to [facility] for elective percutaneous pericardial drain; pt noted to have L PTX. Pt with recent dx of squamous cell carcinoma of LUL. Additional hx of afib, CAD, COPD, HLD   Recorded po intake 100% of meals. Pt reports appetite fairly good at home; pt drinks Boost every AM followed by a good breakfast and lunch. Pt reports appetite decreases in the evening and her dinner meal is usually the smallest meal (might consist of a 1/2 hamburger or 1/2 sandwich).  Pt reports she also likes to snack throughout the day  Pt reports recent weight fluctuations. Reports pt UBW waround 142 pounds; pt had gained weight up to 147 pounds and then lost weight back down to 142 pounds. Current wt 135 pounds. Per weight encounters, pt with 6.8% wt loss in the past 1 month.  Labs: Creatinine 1.81, BUN 48 Meds: prednisone  NUTRITION - FOCUSED PHYSICAL EXAM:    Most Recent Value  Orbital Region  Mild depletion  Upper Arm Region  No depletion  Thoracic and Lumbar Region  No depletion  Temple Region  Mild depletion  Clavicle Bone Region  Mild depletion  Clavicle and Acromion Bone Region  Mild depletion  Scapular Bone Region  Mild depletion  Dorsal Hand  No depletion  Patellar Region  No depletion  Anterior Thigh Region  No depletion  Posterior Calf  Region  No depletion  Edema (RD Assessment)  None       Diet Order:  Diet regular Room service appropriate? Yes; Fluid consistency: Thin  EDUCATION NEEDS:   No education needs have been identified at this time  Skin:  Skin Assessment: Reviewed RN Assessment  Last BM:  10/29  Height:   Ht Readings from Last 1 Encounters:  08/07/17 5\' 4"  (1.626 m)    Weight:   Wt Readings from Last 1 Encounters:  08/07/17 137 lb 9.1 oz (62.4 kg)    BMI:  Body mass index is 23.61 kg/m.  Estimated Nutritional Needs:   Kcal:  1500-1700 kcals  Protein:  75-85 g   Fluid:  >/= 1.5 L  Kerman Passey MS, RD, LDN, CNSC [phone] Pager  [phone] Weekend/On-Call Pager

## 2017-08-08 NOTE — Progress Notes (Signed)
ANTICOAGULATION CONSULT NOTE - Initial Consult  Pharmacy Consult for apixaban Indication: atrial fibrillation  Allergies  Allergen Reactions  . Ampicillin Nausea And Vomiting    Has patient had a PCN reaction causing immediate rash, facial/tongue/throat swelling, SOB or lightheadedness with hypotension: no Has patient had a PCN reaction causing severe rash involving mucus membranes or skin necrosis: no Has patient had a PCN reaction that required hospitalization: no Has patient had a PCN reaction occurring within the last 10 years: no If all of the above answers are "NO", then may proceed with Cephalosporin use  . Codeine Nausea And Vomiting    Patient Measurements: Height: 5\' 4"  (162.6 cm) Weight: 137 lb 9.1 oz (62.4 kg) IBW/kg (Calculated) : 54.7  Vital Signs: Temp: 98.1 F (36.7 C) (10/30 1121) Temp Source: Oral (10/30 1121) BP: 107/97 (10/30 1152) Pulse Rate: 132 (10/30 1130)  Labs:  Recent Labs  08/08/17 0239  HGB 9.3*  HCT 30.8*  PLT 128*  CREATININE 1.81*    Estimated Creatinine Clearance: 22.5 mL/min (A) (by C-G formula based on SCr of 1.81 mg/dL (H)).   Medical History: Past Medical History:  Diagnosis Date  . 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 Bradycardia   . CAD (coronary artery disease)   . Cellulitis of right forearm 05/10/2017  . COPD (chronic obstructive pulmonary disease) ([place])   . Coronary atherosclerosis of native coronary artery 2011  . Encounter for antineoplastic chemotherapy 06/23/2016  . Fibrocystic breast   . History of radiation therapy 07/05/16 - 08/09/16    Left Lung: 45 Gy in 25 fractions  . HTN (hypertension)   . Hyperlipidemia   . Hypothyroidism   . Neutropenic fever ([place]) 03/15/2017  . Obesity   . Pain in joint   . Right renal mass 11/30/2016  . 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 Thrombocytopenia ([place]) 03/15/2017    Medications:  Scheduled:  . amLODipine  5 mg Oral Daily  .  apixaban  5 mg Oral BID  . aspirin EC  81 mg Oral Daily  . atorvastatin  80 mg Oral Daily  . budesonide  0.5 mg Nebulization BID  . cholecalciferol  1,000 Units Oral Daily  . feeding supplement (ENSURE ENLIVE)  237 mL Oral BID BM  . levothyroxine  125 mcg Oral QAC breakfast  . mouth rinse  15 mL Mouth Rinse BID  . metoprolol succinate  50 mg Oral BID  . pantoprazole  40 mg Oral Daily  . predniSONE  10 mg Oral Q breakfast  . sodium chloride flush  3 mL Intravenous Q12H    Assessment: 19 yof admitted for pericardiocentesis for pericardial effusion, with known history of stage IV squamous cell lung carcinoma. Patient was found in atrial fibrillation yesterday and started on amiodarone. Pericardial drain was removed this morning. Plan to start anticoagulation and complete pharmacologic cardioversion after 3 doses.   Dose will be 5 mg twice daily. Age< 80 years and weight>60 kg, with Scr currently greater than 1.5.   Goal of Therapy:  Monitor platelets by anticoagulation protocol: Yes   Plan:  Start apixaban 5 mg twice daily starting tonight Monitor body weight and renal function trend Will follow along with plan for cardioversion   Doylene Canard, PharmD Clinical Pharmacist  Pager: [phone] Clinical Phone for 08/08/2017 until 3:30pm: [phone] If after 3:30pm, please call main pharmacy at [phone]

## 2017-08-08 NOTE — Progress Notes (Signed)
Paged Erin NP with Cardiology to make aware pt converted to NSR at 1655. Vitals stable and remains on amio gtt.

## 2017-08-09 ENCOUNTER — Other Ambulatory Visit: Payer: Self-pay

## 2017-08-09 ENCOUNTER — Encounter (HOSPITAL_COMMUNITY): Payer: Self-pay | Admitting: *Deleted

## 2017-08-09 ENCOUNTER — Inpatient Hospital Stay (HOSPITAL_COMMUNITY): Payer: PPO

## 2017-08-09 DIAGNOSIS — J9621 Acute and chronic respiratory failure with hypoxia: Secondary | ICD-10-CM

## 2017-08-09 LAB — ECHOCARDIOGRAM COMPLETE
Area-P 1/2: 5.95 cm2
EWDT: 127 ms
FS: 24 % — AB (ref 28–44)
HEIGHTINCHES: 64 in
IV/PV OW: 1.3
LA ID, A-P, ES: 35 mm
LA vol A4C: 37.6 ml
LA vol index: 32.7 mL/m2
LADIAMINDEX: 2.08 cm/m2
LAVOL: 55.2 mL
LEFT ATRIUM END SYS DIAM: 35 mm
LVOT area: 2.27 cm2
LVOT diameter: 17 mm
MV Dec: 127
MV pk A vel: 48.5 m/s
MVPG: 8 mmHg
MVPKEVEL: 138 m/s
P 1/2 time: 37 ms
PW: 10 mm — AB (ref 0.6–1.1)
Reg peak vel: 308 cm/s
TAPSE: 17 mm
TRMAXVEL: 308 cm/s
WEIGHTICAEL: 2201.07 [oz_av]

## 2017-08-09 LAB — CBC
HCT: 33.1 % — ABNORMAL LOW (ref 36.0–46.0)
HEMOGLOBIN: 10.1 g/dL — AB (ref 12.0–15.0)
MCH: 31.3 pg (ref 26.0–34.0)
MCHC: 30.5 g/dL (ref 30.0–36.0)
MCV: 102.5 fL — AB (ref 78.0–100.0)
PLATELETS: 147 10*3/uL — AB (ref 150–400)
RBC: 3.23 MIL/uL — AB (ref 3.87–5.11)
RDW: 18.1 % — ABNORMAL HIGH (ref 11.5–15.5)
WBC: 10 10*3/uL (ref 4.0–10.5)

## 2017-08-09 LAB — BASIC METABOLIC PANEL
ANION GAP: 7 (ref 5–15)
BUN: 33 mg/dL — ABNORMAL HIGH (ref 6–20)
CO2: 29 mmol/L (ref 22–32)
Calcium: 8.9 mg/dL (ref 8.9–10.3)
Chloride: 100 mmol/L — ABNORMAL LOW (ref 101–111)
Creatinine, Ser: 1.46 mg/dL — ABNORMAL HIGH (ref 0.44–1.00)
GFR, EST AFRICAN AMERICAN: 39 mL/min — AB (ref >60)
GFR, EST NON AFRICAN AMERICAN: 33 mL/min — AB (ref >60)
GLUCOSE: 151 mg/dL — AB (ref 65–99)
POTASSIUM: 4.4 mmol/L (ref 3.5–5.1)
Sodium: 136 mmol/L (ref 135–145)

## 2017-08-09 LAB — PH, BODY FLUID: pH, Body Fluid: 7.6

## 2017-08-09 LAB — BRAIN NATRIURETIC PEPTIDE: B Natriuretic Peptide: 1025.1 pg/mL — ABNORMAL HIGH (ref 0.0–100.0)

## 2017-08-09 MED ORDER — AMIODARONE HCL 200 MG PO TABS
400.0000 mg | ORAL_TABLET | Freq: Two times a day (BID) | ORAL | Status: DC
  Administered 2017-08-09 – 2017-08-10 (×3): 400 mg via ORAL
  Filled 2017-08-09 (×3): qty 2

## 2017-08-09 MED ORDER — IPRATROPIUM-ALBUTEROL 0.5-2.5 (3) MG/3ML IN SOLN: 3.0000 mL | Freq: Two times a day (BID) | RESPIRATORY_TRACT | Status: DC

## 2017-08-09 MED ORDER — FUROSEMIDE 10 MG/ML IJ SOLN
40.0000 mg | Freq: Once | INTRAMUSCULAR | Status: AC
  Administered 2017-08-09: 40 mg via INTRAVENOUS
  Filled 2017-08-09: qty 4

## 2017-08-09 MED ORDER — IPRATROPIUM-ALBUTEROL 0.5-2.5 (3) MG/3ML IN SOLN
3.0000 mL | Freq: Four times a day (QID) | RESPIRATORY_TRACT | Status: DC
  Administered 2017-08-09 (×2): 3 mL via RESPIRATORY_TRACT
  Filled 2017-08-09 (×2): qty 3

## 2017-08-09 NOTE — Progress Notes (Signed)
#   7.  S/W GABBY @ [place] # [phone] OPT-2    1. ELIQUIS 2.5 MG BID   COVER- YES  CO-PAY- $ 45.00  TIER- 3 DRUG  PRIOR APPROVAL- NO   DEDUCTIBLE: NO   2. ELIQUIS 5 MG BID   COVER YES  CO-PAY- $ 45.00  TIER- 3 DRUG  PRIOR APPROVAL- NO    PREFERRED PHARMACY : [facility]

## 2017-08-09 NOTE — Progress Notes (Signed)
Pts only peripheral IV infiltrated on assessment. RN removed IV and consulted Dr. Irish Lack to ask about reinserting a peripheral IV or accessing patients port-a-cath. RN explained that pt is not receiving any fluids or IV medications. MD verbally stated to not insert peripheral IV or access port-a-cath unless needed.

## 2017-08-09 NOTE — Discharge Instructions (Signed)

## 2017-08-09 NOTE — Progress Notes (Signed)
Name: Renee Vance MRN: [id number] DOB: [birth year]    ADMISSION DATE:  08/07/2017 CONSULTATION DATE:  10/29  REFERRING MD :  Dr. Tamala Julian Cardiology  CHIEF COMPLAINT:  PTX L  HISTORY OF PRESENT ILLNESS:  76 year old female with PMH paroxysmal atrial fibrillation, coronary artery disease with drug-eluting stent in the distal RCA and intermediate stenosis in the LAD had cath in 2011, COPD, hyperlipidemia, obesity, and relatively recent diagnosis of squamous cell carcinoma of the left upper lobe s/p radiochemotherapy with good response. She is being considered for L upper lobectomy. She was found to have a large pericardial effusion on CT chest, which was confirmed on echo. She presented to [facility] 10/29 for elective percutaneous pericardial drain. Procedure complicated by L PTX. PCCM asked to evaluate.  F/u CXR revealed no ptx and PCCM signed off.  On 10/31 husband asked for PCCM to re-visit r/t SOB.     SIGNIFICANT EVENTS  10/29 present for pericardial drain > PTX > admit  STUDIES:    SUBJECTIVE:   VITAL SIGNS: Temp:  [98.1 F (36.7 C)-98.6 F (37 C)] 98.3 F (36.8 C) (10/31 1242) Pulse Rate:  [61-130] 69 (10/31 1300) BP: (103-159)/(58-103) 135/70 (10/31 1300) SpO2:  [94 %-100 %] 100 % (10/31 1300)  PHYSICAL EXAMINATION: General:  Elderly female of normal body habitus in NAD Neuro:  Alert, oriented, non-focal HEENT:  [place]/AT, PERRL, no JVD Cardiovascular:  Tachy, regular, no MRG Lungs: resps even non labored, scattered crackles  Abdomen:  Soft, non-tender, non-distended Musculoskeletal:  No acute deformity or ROM limitation Skin:  Grossly intact   Recent Labs Lab 08/04/17 1554 08/08/17 0239 08/09/17 1016  NA 140 137 136  K 4.5 4.5 4.4  CL 93* 100* 100*  CO2 31* 31 29  BUN 36* 48* 33*  CREATININE 1.48* 1.81* 1.46*  GLUCOSE 135* 117* 151*    Recent Labs Lab 08/04/17 1554 08/08/17 0239 08/09/17 1016  HGB 10.1* 9.3* 10.1*  HCT 32.8* 30.8* 33.1*  WBC 7.5 6.1  10.0  PLT 160 128* 147*   Dg Chest Port 1 View  Result Date: 08/09/2017 CLINICAL DATA:  Shortness of Breath EXAM: PORTABLE CHEST 1 VIEW COMPARISON:  August 07, 2017 and June 09, 2017; chest CT May 18, 2017 FINDINGS: There is underlying pulmonary fibrosis. There is a Port-A-Cath with tip in superior vena cava. No pneumothorax. Previously noted chest tube on the left is been removed. There is loculated pleural effusion on the right with areas of patchy airspace consolidation in the left perihilar and left base regions, stable. No new opacity evident. Heart is mildly enlarged. The pulmonary vascularity is within normal limits. There is aortic atherosclerosis. No evident adenopathy. No bone lesions. IMPRESSION: Underlying pulmonary fibrosis. Perihilar airspace consolidation on the left as well as patchy infiltrate in the left base remain. There are areas of loculated pleural effusion on the left, stable. Stable cardiac prominence. Note that on prior CT, a pericardial effusion contributing to the cardiac enlargement. Aortic atherosclerosis present. No adenopathy demonstrable by radiography. Aortic Atherosclerosis (ICD10-I70.0). Electronically Signed   By: Lowella Grip III M.D.   On: 08/09/2017 10:40   Dg Chest Port 1 View  Result Date: 08/07/2017 CLINICAL DATA:  Left-sided pneumothorax follow-up study EXAM: PORTABLE CHEST 1 VIEW COMPARISON:  Chest x-ray of July 31, 2017. FINDINGS: The right lung is well-expanded. The interstitial markings are mildly prominent though stable. On the left there is persistent volume loss. Pleural fluid or pleural thickening is present along the lateral aspect  of the left upper lobe. The small caliber chest tube tip projects over the posteromedial aspect of the left seventh rib. There is calcification in the wall of the aortic arch. The power port catheter tip projects over the midportion of the SVC. The cardiac silhouette is mildly enlarged but stable. The left heart  border remains obscured. IMPRESSION: No evidence of a pneumothorax. Stable volume loss on the left which may reflect pleural thickening or bite or loculated pleural fluid. New small caliber left-sided chest tube. Cardiomegaly without pulmonary vascular congestion. Electronically Signed   By: David  Martinique M.D.   On: 08/07/2017 17:04    ASSESSMENT / PLAN:  76yo female with acute on chronic respiratory failure - multifactorial in setting volume overload r/t PAF c/b AKI, large pericardial effusion s/p pericardiocentesis with underlying stage IV squamous cell lung can and significant radiation pneumonitis.   PLAN -  Ongoing diuresis per cards and BP and Scr will tolerate  Schedule duonebs q6  Continue budesonide  Continue prednisone at maintenance dose - doubt any utility in increasing steroids at this time   Supplemental O2 - 4L [place] baseline  HR control    Nickolas Madrid, NP 08/09/2017  3:28 PM Pager: [phone] or [phone]

## 2017-08-09 NOTE — Progress Notes (Signed)
Progress Note  Patient Name: Renee Vance Date of Encounter: 08/09/2017  Primary Cardiologist: Dr Tamala Julian  Subjective   Feels more short of breath this morning.  Denies chest pain.  Inpatient Medications    Scheduled Meds: . amLODipine  5 mg Oral Daily  . apixaban  5 mg Oral BID  . aspirin EC  81 mg Oral Daily  . atorvastatin  80 mg Oral Daily  . budesonide  0.5 mg Nebulization BID  . cholecalciferol  1,000 Units Oral Daily  . ipratropium-albuterol  3 mL Nebulization BID  . lactose free nutrition  237 mL Oral Daily  . levothyroxine  125 mcg Oral QAC breakfast  . mouth rinse  15 mL Mouth Rinse BID  . metoprolol succinate  50 mg Oral BID  . pantoprazole  40 mg Oral Daily  . predniSONE  10 mg Oral Q breakfast  . sodium chloride flush  3 mL Intravenous Q12H   Continuous Infusions: . sodium chloride    . amiodarone 30 mg/hr (08/09/17 0805)   PRN Meds: sodium chloride, acetaminophen, acetaminophen, morphine injection, ondansetron (ZOFRAN) IV, sodium chloride flush   Vital Signs    Vitals:   08/09/17 0700 08/09/17 0745 08/09/17 0800 08/09/17 0812  BP: (!) 159/72  (!) 152/90   Pulse: 66  65   Resp:      Temp:  98.1 F (36.7 C)    TempSrc:  Oral    SpO2: 98%  100% 100%  Weight:      Height:        Intake/Output Summary (Last 24 hours) at 08/09/17 0930 Last data filed at 08/09/17 0802  Gross per 24 hour  Intake           1104.1 ml  Output             1095 ml  Net              9.1 ml   Filed Weights   08/07/17 1131 08/07/17 1608  Weight: 142 lb (64.4 kg) 137 lb 9.1 oz (62.4 kg)    Telemetry    Converted from atrial flutter to normal sinus rhythm yesterday.  Maintaining sinus rhythm without arrhythmia at this morning.- Personally Reviewed   Physical Exam  Alert, oriented, elderly woman with purse-lip breathing GEN: mild respiratory distress with conversation   Neck: JVP elevated Cardiac: RRR, 2/6 SEM at the RUSB Respiratory:  Prolonged expiration  with diminished breath sounds left worse than right GI: Soft, nontender, non-distended  MS: No edema; No deformity. Neuro:  Nonfocal  Psych: Normal affect   Labs    Chemistry Recent Labs Lab 08/04/17 1554 08/08/17 0239  NA 140 137  K 4.5 4.5  CL 93* 100*  CO2 31* 31  GLUCOSE 135* 117*  BUN 36* 48*  CREATININE 1.48* 1.81*  CALCIUM 9.3 8.6*  PROT  --  5.7*  ALBUMIN  --  2.5*  AST  --  76*  ALT  --  61*  ALKPHOS  --  58  BILITOT  --  0.8  GFRNONAA 34* 26*  GFRAA 39* 30*  ANIONGAP  --  6     Hematology Recent Labs Lab 08/04/17 1554 08/08/17 0239  WBC 7.5 6.1  RBC 3.26* 3.02*  HGB 10.1* 9.3*  HCT 32.8* 30.8*  MCV 101* 102.0*  MCH 31.0 30.8  MCHC 30.8* 30.2  RDW 19.4* 18.7*  PLT 160 128*    Cardiac EnzymesNo results for input(s): TROPONINI in the last 168  hours. No results for input(s): TROPIPOC in the last 168 hours.   BNPNo results for input(s): BNP, PROBNP in the last 168 hours.   DDimer No results for input(s): DDIMER in the last 168 hours.   Radiology    Dg Chest Port 1 View  Result Date: 08/07/2017 CLINICAL DATA:  Left-sided pneumothorax follow-up study EXAM: PORTABLE CHEST 1 VIEW COMPARISON:  Chest x-ray of July 31, 2017. FINDINGS: The right lung is well-expanded. The interstitial markings are mildly prominent though stable. On the left there is persistent volume loss. Pleural fluid or pleural thickening is present along the lateral aspect of the left upper lobe. The small caliber chest tube tip projects over the posteromedial aspect of the left seventh rib. There is calcification in the wall of the aortic arch. The power port catheter tip projects over the midportion of the SVC. The cardiac silhouette is mildly enlarged but stable. The left heart border remains obscured. IMPRESSION: No evidence of a pneumothorax. Stable volume loss on the left which may reflect pleural thickening or bite or loculated pleural fluid. New small caliber left-sided chest  tube. Cardiomegaly without pulmonary vascular congestion. Electronically Signed   By: David  Martinique M.D.   On: 08/07/2017 17:04    Cardiac Studies   2D echo images reviewed, formal interpretation from yesterday's study is still pending.  Marked reduction in the size of her pericardial effusion with small persistent effusion noted.  Patient Profile     76 y.o. female with stage IV squamous cell lung cancer and large pericardial effusion who presented 08/07/2017 for pericardiocentesis  Assessment & Plan    1.  Large pericardial effusion: Improved after pericardiocentesis.  Drain was removed yesterday.  I have reviewed her echo images and will await formal interpretation.  2.  Paroxysmal atrial flutter with RVR: Converted to sinus rhythm on IV amiodarone.  Will convert to oral amiodarone 400 mg twice daily today.  Continue anticoagulation with apixaban.  3.  Acute on chronic respiratory failure: Will add morning labs, portable chest x-ray, and await results.  The patient is on 24/7 home O2 with COPD and lung cancer.  Not sure we will be able to make much headway with her breathing.  Continue treatment for COPD.  I am reluctant to diurese her with worsening renal function.  Will await her renal panel this morning.  4.  Acute on chronic kidney injury: Yesterday's creatinine was 1.8 mg/dL which is increased from baseline.  Will reassess this morning.  For questions or updates, please contact [place] Please consult [email or web address] for contact info under Cardiology/STEMI.      Deatra James, MD  08/09/2017, 9:30 AM

## 2017-08-09 NOTE — Plan of Care (Signed)
Problem: Pain Managment: Goal: General experience of comfort will improve Outcome: Completed/Met Date Met: 08/09/17 Pt is experiencing no pain.  Problem: Activity: Goal: Risk for activity intolerance will decrease Outcome: Progressing Pt states that her respiratory status is back to baseline and gets up and down to the bedside commode without difficulty.

## 2017-08-10 ENCOUNTER — Other Ambulatory Visit: Payer: Self-pay | Admitting: Physician Assistant

## 2017-08-10 DIAGNOSIS — I700 Atherosclerosis of aorta: Secondary | ICD-10-CM

## 2017-08-10 DIAGNOSIS — I5033 Acute on chronic diastolic (congestive) heart failure: Secondary | ICD-10-CM

## 2017-08-10 DIAGNOSIS — I313 Pericardial effusion (noninflammatory): Secondary | ICD-10-CM

## 2017-08-10 DIAGNOSIS — I3139 Other pericardial effusion (noninflammatory): Secondary | ICD-10-CM

## 2017-08-10 LAB — BASIC METABOLIC PANEL
ANION GAP: 7 (ref 5–15)
BUN: 29 mg/dL — ABNORMAL HIGH (ref 6–20)
CALCIUM: 9.1 mg/dL (ref 8.9–10.3)
CO2: 35 mmol/L — AB (ref 22–32)
CREATININE: 1.53 mg/dL — AB (ref 0.44–1.00)
Chloride: 98 mmol/L — ABNORMAL LOW (ref 101–111)
GFR, EST AFRICAN AMERICAN: 37 mL/min — AB (ref >60)
GFR, EST NON AFRICAN AMERICAN: 32 mL/min — AB (ref >60)
Glucose, Bld: 92 mg/dL (ref 65–99)
Potassium: 4.8 mmol/L (ref 3.5–5.1)
Sodium: 140 mmol/L (ref 135–145)

## 2017-08-10 MED ORDER — APIXABAN 5 MG PO TABS: 5.0000 mg | ORAL_TABLET | Freq: Two times a day (BID) | ORAL | 11 refills | Status: DC

## 2017-08-10 MED ORDER — AMIODARONE HCL 200 MG PO TABS: ORAL_TABLET | ORAL | 0 refills | Status: DC

## 2017-08-10 MED ORDER — APIXABAN 5 MG PO TABS: 5.0000 mg | ORAL_TABLET | Freq: Two times a day (BID) | ORAL | 0 refills | Status: DC

## 2017-08-10 MED ORDER — IPRATROPIUM-ALBUTEROL 0.5-2.5 (3) MG/3ML IN SOLN
3.0000 mL | Freq: Three times a day (TID) | RESPIRATORY_TRACT | Status: DC
  Administered 2017-08-10: 3 mL via RESPIRATORY_TRACT
  Filled 2017-08-10 (×2): qty 3

## 2017-08-10 NOTE — Progress Notes (Signed)
Progress Note  Patient Name: Renee Vance Date of Encounter: 08/10/2017  Primary Cardiologist: Elbert   Patient is feeling better today.  Her breathing is improved.  She did not feel episodes of atrial fibrillation with RVR overnight but has been back in sinus rhythm since about 3:30 AM.  No chest pain.  Inpatient Medications    Scheduled Meds: . amiodarone  400 mg Oral BID  . amLODipine  5 mg Oral Daily  . apixaban  5 mg Oral BID  . aspirin EC  81 mg Oral Daily  . atorvastatin  80 mg Oral Daily  . budesonide  0.5 mg Nebulization BID  . cholecalciferol  1,000 Units Oral Daily  . ipratropium-albuterol  3 mL Nebulization TID  . lactose free nutrition  237 mL Oral Daily  . levothyroxine  125 mcg Oral QAC breakfast  . mouth rinse  15 mL Mouth Rinse BID  . metoprolol succinate  50 mg Oral BID  . pantoprazole  40 mg Oral Daily  . predniSONE  10 mg Oral Q breakfast  . sodium chloride flush  3 mL Intravenous Q12H   Continuous Infusions: . sodium chloride     PRN Meds: sodium chloride, acetaminophen, acetaminophen, morphine injection, ondansetron (ZOFRAN) IV, sodium chloride flush   Vital Signs    Vitals:   08/10/17 0700 08/10/17 0800 08/10/17 0804 08/10/17 0952  BP: 139/78 (!) 130/102 (!) 130/102 136/65  Pulse: 69 96 72 77  Resp:   18   Temp: 98 F (36.7 C)     TempSrc: Oral     SpO2: 100% 94% 100%   Weight:      Height:        Intake/Output Summary (Last 24 hours) at 08/10/17 1039 Last data filed at 08/10/17 0400  Gross per 24 hour  Intake           799.33 ml  Output                0 ml  Net           799.33 ml   Filed Weights   08/07/17 1131 08/07/17 1608  Weight: 142 lb (64.4 kg) 137 lb 9.1 oz (62.4 kg)    Telemetry    Normal sinus rhythm with atrial fibrillation, 2 episodes totaling approximately 5-6 hours - Personally Reviewed   Physical Exam  Pleasant elderly woman, comfortable lying in bed GEN: No acute distress.   Neck:  Mild  JVD Cardiac: RRR, no murmurs, rubs, or gallops.  Respiratory: Clear to auscultation bilaterally. GI: Soft, nontender, non-distended  MS: No edema; No deformity. Neuro:  Nonfocal  Psych: Normal affect   Labs    Chemistry Recent Labs Lab 08/08/17 0239 08/09/17 1016 08/10/17 0358  NA 137 136 140  K 4.5 4.4 4.8  CL 100* 100* 98*  CO2 31 29 35*  GLUCOSE 117* 151* 92  BUN 48* 33* 29*  CREATININE 1.81* 1.46* 1.53*  CALCIUM 8.6* 8.9 9.1  PROT 5.7*  --   --   ALBUMIN 2.5*  --   --   AST 76*  --   --   ALT 61*  --   --   ALKPHOS 58  --   --   BILITOT 0.8  --   --   GFRNONAA 26* 33* 32*  GFRAA 30* 39* 37*  ANIONGAP 6 7 7      Hematology Recent Labs Lab 08/04/17 1554 08/08/17 0239 08/09/17 1016  WBC 7.5 6.1  10.0  RBC 3.26* 3.02* 3.23*  HGB 10.1* 9.3* 10.1*  HCT 32.8* 30.8* 33.1*  MCV 101* 102.0* 102.5*  MCH 31.0 30.8 31.3  MCHC 30.8* 30.2 30.5  RDW 19.4* 18.7* 18.1*  PLT 160 128* 147*    Cardiac EnzymesNo results for input(s): TROPONINI in the last 168 hours. No results for input(s): TROPIPOC in the last 168 hours.   BNP Recent Labs Lab 08/09/17 1016  BNP 1,025.1*     DDimer No results for input(s): DDIMER in the last 168 hours.   Radiology    Dg Chest Port 1 View  Result Date: 08/09/2017 CLINICAL DATA:  Shortness of Breath EXAM: PORTABLE CHEST 1 VIEW COMPARISON:  August 07, 2017 and June 09, 2017; chest CT May 18, 2017 FINDINGS: There is underlying pulmonary fibrosis. There is a Port-A-Cath with tip in superior vena cava. No pneumothorax. Previously noted chest tube on the left is been removed. There is loculated pleural effusion on the right with areas of patchy airspace consolidation in the left perihilar and left base regions, stable. No new opacity evident. Heart is mildly enlarged. The pulmonary vascularity is within normal limits. There is aortic atherosclerosis. No evident adenopathy. No bone lesions. IMPRESSION: Underlying pulmonary fibrosis.  Perihilar airspace consolidation on the left as well as patchy infiltrate in the left base remain. There are areas of loculated pleural effusion on the left, stable. Stable cardiac prominence. Note that on prior CT, a pericardial effusion contributing to the cardiac enlargement. Aortic atherosclerosis present. No adenopathy demonstrable by radiography. Aortic Atherosclerosis (ICD10-I70.0). Electronically Signed   By: Lowella Grip III M.D.   On: 08/09/2017 10:40    Cardiac Studies   2D echocardiogram 08/08/2017: Study Conclusions  - Left ventricle: The cavity size was normal. There was moderate   concentric hypertrophy. Systolic function was normal. The   estimated ejection fraction was in the range of 60% to 65%. The   study is not technically sufficient to allow evaluation of LV   diastolic function. - Aortic valve: Mildly calcified leaflets. There was no stenosis.   There was no regurgitation. - Mitral valve: Mildly thickened leaflets . There was mild   regurgitation. - Left atrium: The atrium was mildly dilated. - Right ventricle: The cavity size was mildly dilated. Systolic   function was normal. - Right atrium: The atrium was normal in size. - Atrial septum: There was increased thickness of the septum,   consistent with lipomatous hypertrophy. - Pulmonary arteries: PA peak pressure: 41 mm Hg (S). - Pericardium, extracardiac: A trivial pericardial effusion was   identified posterior to the heart. Features were not consistent   with tamponade physiology.  Impressions:  - Compared to a prior study on 08/01/2017, there is now only a   trivial posterior pericardial effusion.  Patient Profile     76 y.o. female with stage IV squamous cell lung cancer and large pericardial effusion who presented 08/07/2017 for pericardiocentesis  Assessment & Plan    1.  Pericardial effusion: Improved after pericardiocentesis with trace residual effusion.  I have recommended a repeat  echocardiogram in 2 weeks.  2.  Paroxysmal atrial fibrillation/flutter with RVR: Continue oral amiodarone loading as an outpatient 400 mg twice daily times 2 weeks, then 200 mg daily thereafter.  The patient is tolerating anticoagulation with apixaban.  3.  Acute on chronic respiratory failure: Likely contribution from diastolic heart failure.  Start Lasix 40 mg p.o. daily.  She responded well to IV Lasix yesterday and her creatinine is  stable.  4.  Acute on chronic kidney injury: Renal function is back to baseline.  Disposition: The patient is stable for discharge home today.  She is on 24/7 home O2 at baseline.  We discussed her new medications which include amiodarone, apixaban, and she understands to restart furosemide 40 Will arrange close outpatient follow-up with Dr. Tamala Julian or me.  She should have an echocardiogram in 2 weeks to reassess her effusion.  For questions or updates, please contact Vivian Please consult [email or web address] for contact info under Cardiology/STEMI.      Deatra James, MD  08/10/2017, 10:39 AM

## 2017-08-10 NOTE — Discharge Summary (Signed)
Discharge Summary    Patient ID: Renee Vance,  MRN: [id number], DOB/AGE: 1941/09/27 76 y.o.  Admit date: 08/07/2017 Discharge date: 08/10/2017  Primary Care Provider: [name] Primary Cardiologist: Dr. 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 Ampicillin Nausea And Vomiting    Has patient had a PCN reaction causing immediate rash, facial/tongue/throat swelling, SOB or lightheadedness with hypotension: no Has patient had a PCN reaction causing severe rash involving mucus membranes or skin necrosis: no Has patient had a PCN reaction that required hospitalization: no Has patient had a PCN reaction occurring within the last 10 years: no If all of the above answers are "NO", then may proceed with Cephalosporin use  . Codeine Nausea And Vomiting    Diagnostic Studies/Procedures    PERICARDIOCENTESIS  08/07/17  Conclusion    Successful pericardiocentesis with removal of 900 cc of serous pericardial fluid.  Moderate elevation of ventral pericardial pressure was noted upon entering the pericardial space, 16 mmHg mean.  Possible procedure related left lower lobe pneumothorax.  Atrial flutter developing in the interval between her office visit 72 hours ago and elective procedure today.  RECOMMENDATIONS:   Continue pericardial drain times 24-48 hours.  Critical care medicine was consulted concerning the pneumothorax.  Patient will likely need a chest tube placed.  ECG upon arrival in the unit.  Echocardiogram in  a.m.  Cytology, bacteriology, and chemical profile/analysis of the drain pericardial fluid has been sent.  May require electrical cardioversion of atrial flutter continues.  Amiodarone bolus and infusion.     Echo 08/09/47 Study Conclusions  - Left ventricle: The cavity size was normal. There was moderate   concentric hypertrophy. Systolic function was normal. The   estimated ejection fraction was in the range of 60% to 65%. The   study is not technically sufficient to allow evaluation of LV   diastolic function. - Aortic valve: Mildly calcified leaflets. There was no stenosis.   There was no regurgitation. - Mitral valve: Mildly thickened leaflets . There was mild   regurgitation. - Left atrium: The atrium was mildly dilated. - Right ventricle: The cavity size was mildly dilated. Systolic   function was normal. - Right atrium: The atrium was normal in size. - Atrial septum: There was increased thickness of the septum,   consistent with lipomatous hypertrophy. - Pulmonary arteries: PA peak pressure: 41 mm Hg (S). - Pericardium, extracardiac: A trivial pericardial effusion was   identified posterior to the heart. Features were not consistent   with tamponade physiology.  Impressions:  - Compared to a prior study on 08/01/2017, there is now only a   trivial posterior pericardial effusion. History of Present Illness     Renee Vance is a 76 y.o. female with cardiac history of paroxysmal atrial fibrillation, coronary artery disease with drug-eluting stent in the distal RCA and intermediate stenosis in the LAD had cath in 2011, COPD, hyperlipidemia, obesity, and relatively recent diagnosis of squamous cell carcinoma of the left upper lobe s/p radiochemotherapy with good response (She is  now being considered for left upper lobectomy. FEV1 is 70%) admitted 08/07/17 for scheduled pericardiocentesis.   Performed echocardiography 08/01/17 to identify severity of pericardial  effusion noted on recent chest CT scan. Over the past 3-6 months, paracardial effusion has been noted and on the last CT was considered large. Echocardiography demonstrated a large effusion with right ventricular inversion but no true echocardiographic evidence of tamponade. Dr. Tamala Julian spoke with Dr. Elsworth Soho, and felt next clinical move would be to drain the pericardial effusion percutaneously. She is on continuous oxygen therapy and a pericardial window which would require intubation could be an issue that would increase risk for pulmonary complications.   Hospital Course     Consultants: Critical Care  1.  Pericardial effusion:  - The patient has undergone successful pericardiocentesis with removal of 900 cc of serous pericardial fluid in the cardiac catheterization lab by Dr. Tamala Julian.  Echo next day demonstrates near resolution of her pericardial effusion.  There is a very small residual effusion compared to what was previously a large circumferential effusion. recommended repeat echo in 2 weeks. Culture pending, no growth so far.   2.  Atrial flutter with RVR:  - Noted in aflutter RVR during procedure. She was in sinus rhythm during her office visit. Started on amiodarone bolus and infusion. Converted to sinus rhythm. Started anticoagulation with apixaban. CHADSVASCs score of at least 5. Continue oral amiodarone loading as an outpatient 400 mg twice daily times 2 weeks, then 200 mg daily thereafter.    3.  Acute on chronic respiratory failure with hypoxia - Likely contribution from diastolic heart failure, pulmonary edema and COPD. Responded well with IV lasix.  Start Lasix 40 mg p.o. daily.  Her creatinine is stable.  4.  Acute on chronic kidney injury: -  Renal function is back to baseline.   The patient has been seen by Dr. Burt Knack  today and deemed ready for discharge home. All follow-up appointments have been scheduled. Discharge medications are listed below.  _____________   Discharge  Vitals Blood pressure (!) 135/56, pulse 72, temperature 98 F (36.7 C), temperature source Oral, resp. rate 18, height 5\' 4"  (1.626 m), weight 137 lb 9.1 oz (62.4 kg), SpO2 97 %.  Filed Weights   08/07/17 1131 08/07/17 1608  Weight: 142 lb (64.4 kg) 137 lb 9.1 oz (62.4 kg)    Labs & Radiologic Studies     CBC  Recent Labs  08/08/17 0239 08/09/17 1016  WBC 6.1 10.0  HGB 9.3* 10.1*  HCT 30.8* 33.1*  MCV 102.0* 102.5*  PLT 128* 409*   Basic Metabolic Panel  Recent Labs  08/09/17 1016 08/10/17 0358  NA 136 140  K 4.4 4.8  CL 100* 98*  CO2 29 35*  GLUCOSE 151* 92  BUN 33* 29*  CREATININE 1.46* 1.53*  CALCIUM 8.9 9.1   Liver Function Tests  Recent Labs  08/08/17 0239  AST 76*  ALT 61*  ALKPHOS 58  BILITOT 0.8  PROT 5.7*  ALBUMIN 2.5*    Dg Chest 2 View  Result Date: 07/31/2017 CLINICAL DATA:  Lung cancer.  Chemotherapy and radiation. EXAM: CHEST  2 VIEW COMPARISON:  Two-view chest x-ray a 06/20/2017 and 05/18/2017 FINDINGS: Heart is enlarged. Bilateral fibrotic changes are again noted. A right IJ Port-A-Cath is in place. Left-sided airspace opacity is again seen. Chronic left pleural effusion is stable. IMPRESSION: 1. No acute cardiopulmonary disease or significant interval change. 2. Stable cardiomegaly without failure. 3. Similar appearance of pulmonary fibrosis. 4.  Stable areas of airspace opacity in the left lung compatible with treated tumor. 5. Chronic left pleural effusion. Electronically Signed   By: San Morelle M.D.   On: 07/31/2017 15:15   Dg Chest Port 1 View  Result Date: 08/09/2017 CLINICAL DATA:  Shortness of Breath EXAM: PORTABLE CHEST 1 VIEW COMPARISON:  August 07, 2017 and June 09, 2017; chest CT May 18, 2017 FINDINGS: There is underlying pulmonary fibrosis. There is a Port-A-Cath with tip in superior vena cava. No pneumothorax. Previously noted chest tube on the left is been removed. There is loculated pleural effusion on the right  with areas of patchy airspace consolidation in the left perihilar and left base regions, stable. No new opacity evident. Heart is mildly enlarged. The pulmonary vascularity is within normal limits. There is aortic atherosclerosis. No evident adenopathy. No bone lesions. IMPRESSION: Underlying pulmonary fibrosis. Perihilar airspace consolidation on the left as well as patchy infiltrate in the left base remain. There are areas of loculated pleural effusion on the left, stable. Stable cardiac prominence. Note that on prior CT, a pericardial effusion contributing to the cardiac enlargement. Aortic atherosclerosis present. No adenopathy demonstrable by radiography. Aortic Atherosclerosis (ICD10-I70.0). Electronically Signed   By: Lowella Grip III M.D.   On: 08/09/2017 10:40   Dg Chest Port 1 View  Result Date: 08/07/2017 CLINICAL DATA:  Left-sided pneumothorax follow-up study EXAM: PORTABLE CHEST 1 VIEW COMPARISON:  Chest x-ray of July 31, 2017. FINDINGS: The right lung is well-expanded. The interstitial markings are mildly prominent though stable. On the left there is persistent volume loss. Pleural fluid or pleural thickening is present along the lateral aspect of the left upper lobe. The small caliber chest tube tip projects over the posteromedial aspect of the left seventh rib. There is calcification in the wall of the aortic arch. The power port catheter tip projects over the midportion of the SVC. The cardiac silhouette is mildly enlarged but stable. The left heart border remains obscured. IMPRESSION: No evidence of a pneumothorax. Stable volume loss on the left which may reflect pleural thickening or bite or loculated pleural fluid. New small caliber left-sided chest tube. Cardiomegaly without pulmonary vascular congestion. Electronically Signed   By: David  Martinique M.D.   On: 08/07/2017 17:04    Disposition   Pt is being discharged home today in good condition.  Follow-up Plans & Appointments     Follow-up Information    Burtis Junes, NP. Go on 08/16/2017.   Specialties:  Nurse Practitioner, Interventional Cardiology, Cardiology, Radiology Why:  @2pm  for post hospital follow up  Office will call with time and date of echo. call monday if did not heard anything by then Contact information: [place]. [address] [place] [address] [phone]          Discharge Instructions    Call MD for:  redness, tenderness, or signs of infection (pain, swelling, redness, odor or green/yellow discharge around incision site)    Complete by:  As directed    Diet - low sodium heart healthy    Complete by:  As directed    Increase activity slowly    Complete by:  As directed       Discharge Medications   Current Discharge Medication List    START taking these medications   Details  amiodarone (PACERONE) 200 MG tablet Take 400 mg (2 tables) twice daily for 2 weeks, then 200 mg  (1 tablet) daily thereafter. Qty: 70 tablet, Refills: 0    !!  apixaban (ELIQUIS) 5 MG TABS tablet Take 1 tablet (5 mg total) by mouth 2 (two) times daily. Qty: 60 tablet, Refills: 11    !! apixaban (ELIQUIS) 5 MG TABS tablet Take 1 tablet (5 mg total) by mouth 2 (two) times daily. For 30 days free supply Qty: 60 tablet, Refills: 0     !! - Potential duplicate medications found. Please discuss with provider.    CONTINUE these medications which have NOT CHANGED   Details  amLODipine (NORVASC) 5 MG tablet Take 5 mg by mouth daily.    aspirin 81 MG tablet Take 81 mg by mouth daily.    atorvastatin (LIPITOR) 80 MG tablet Take 1 tablet (80 mg total) by mouth daily. Qty: 90 tablet, Refills: 1    budesonide (PULMICORT) 0.5 MG/2ML nebulizer solution Take 2 mLs (0.5 mg total) by nebulization 2 (two) times daily. Qty: 120 mL, Refills: 3    ipratropium-albuterol (DUONEB) 0.5-2.5 (3) MG/3ML SOLN Take 3 mLs by nebulization every 6 (six) hours as needed. Qty: 360 mL, Refills: 0     levothyroxine (SYNTHROID, LEVOTHROID) 125 MCG tablet Take 125 mcg by mouth daily before breakfast.     metoprolol succinate (TOPROL-XL) 50 MG 24 hr tablet TAKE ONE TABLET BY MOUTH TWICE DAILY Qty: 180 tablet, Refills: 3    omeprazole (PRILOSEC) 20 MG capsule Take 30- 60 min before your first and last meals of the day Qty: 60 capsule, Refills: 2    predniSONE (DELTASONE) 10 MG tablet Take 1 tablet (10 mg total) by mouth daily with breakfast. Qty: 20 tablet, Refills: 0   Associated Diagnoses: Radiation pneumonitis (HCC)    Vitamin D, Cholecalciferol, 1000 UNITS CAPS Take 1,000 Units by mouth daily.     acetaminophen (TYLENOL) 325 MG tablet Take 650 mg by mouth every 6 (six) hours as needed.           Outstanding Labs/Studies   Echo in 2 weeks  Duration of Discharge Encounter   Greater than 30 minutes including physician time.  Signed, Patrycja Mumpower PA-C 08/10/2017, 12:54 PM

## 2017-08-10 NOTE — Progress Notes (Signed)
PCCM Progress Note  Subjective: She feels back to baseline with breathing.  Slept better last night.  BP 136/65   Pulse 77   Temp 98 F (36.7 C) (Oral)   Resp 18   Ht 5\' 4"  (1.626 m)   Wt 137 lb 9.1 oz (62.4 kg)   SpO2 100%   BMI 23.61 kg/m   I/O last 3 completed shifts: In: 1239.7 [P.O.:960; I.V.:279.7] Out: 400 [Urine:400]  General - pleasant Eyes - pupils reactive ENT - no sinus tenderness, no oral exudate, no LAN Cardiac - regular, no murmur Chest - better air movement, no wheeze Abd - soft, non tender Ext - no edema Skin - no rashes Neuro - normal strength Psych - normal mood   CMP Latest Ref Rng & Units 08/10/2017 08/09/2017 08/08/2017  Glucose 65 - 99 mg/dL 92 151(H) 117(H)  BUN 6 - 20 mg/dL 29(H) 33(H) 48(H)  Creatinine 0.44 - 1.00 mg/dL 1.53(H) 1.46(H) 1.81(H)  Sodium 135 - 145 mmol/L 140 136 137  Potassium 3.5 - 5.1 mmol/L 4.8 4.4 4.5  Chloride 101 - 111 mmol/L 98(L) 100(L) 100(L)  CO2 22 - 32 mmol/L 35(H) 29 31  Calcium 8.9 - 10.3 mg/dL 9.1 8.9 8.6(L)  Total Protein 6.5 - 8.1 g/dL - - 5.7(L)  Total Bilirubin 0.3 - 1.2 mg/dL - - 0.8  Alkaline Phos 38 - 126 U/L - - 58  AST 15 - 41 U/L - - 76(H)  ALT 14 - 54 U/L - - 61(H)   CBC Latest Ref Rng & Units 08/09/2017 08/08/2017 08/04/2017  WBC 4.0 - 10.5 K/uL 10.0 6.1 7.5  Hemoglobin 12.0 - 15.0 g/dL 10.1(L) 9.3(L) 10.1(L)  Hematocrit 36.0 - 46.0 % 33.1(L) 30.8(L) 32.8(L)  Platelets 150 - 400 K/uL 147(L) 128(L) 160    Dg Chest Port 1 View  Result Date: 08/09/2017 CLINICAL DATA:  Shortness of Breath EXAM: PORTABLE CHEST 1 VIEW COMPARISON:  August 07, 2017 and June 09, 2017; chest CT May 18, 2017 FINDINGS: There is underlying pulmonary fibrosis. There is a Port-A-Cath with tip in superior vena cava. No pneumothorax. Previously noted chest tube on the left is been removed. There is loculated pleural effusion on the right with areas of patchy airspace consolidation in the left perihilar and left base  regions, stable. No new opacity evident. Heart is mildly enlarged. The pulmonary vascularity is within normal limits. There is aortic atherosclerosis. No evident adenopathy. No bone lesions. IMPRESSION: Underlying pulmonary fibrosis. Perihilar airspace consolidation on the left as well as patchy infiltrate in the left base remain. There are areas of loculated pleural effusion on the left, stable. Stable cardiac prominence. Note that on prior CT, a pericardial effusion contributing to the cardiac enlargement. Aortic atherosclerosis present. No adenopathy demonstrable by radiography. Aortic Atherosclerosis (ICD10-I70.0). Electronically Signed   By: Lowella Grip III M.D.   On: 08/09/2017 10:40    Assessment/plan:  Acute on chronic respiratory failure with hypoxia. - from pulmonary edema, COPD, and radiation fibrosis - oxygen to keep SpO2 90 to 95% - continue nebulizer tx - diuresis per cardiology - continue maintenance dose of prednisone  She has f/u with Dr. Elsworth Soho in pulmonary office on 09/06/17.  Okay for d/c home from pulmonary stand point.  Chesley Mires, MD [facility] Pulmonary/Critical Care 08/10/2017, 11:10 AM Pager:  [phone] After 3pm call: [phone]

## 2017-08-11 ENCOUNTER — Telehealth: Payer: Self-pay | Admitting: Pulmonary Disease

## 2017-08-11 DIAGNOSIS — J449 Chronic obstructive pulmonary disease, unspecified: Secondary | ICD-10-CM

## 2017-08-11 NOTE — Telephone Encounter (Signed)
ATC patient again at home. Received busy signal. Called the cell number and left a VM.

## 2017-08-11 NOTE — Telephone Encounter (Signed)
ATC patient twice. Received a busy tone each time. Will call back later.   I reviewed the discharge summary and RA's notes. RA wanted her to be on Lasix 40mg . Per her hospital D/C papers, doctor there wanted her to stay on Lasix 40mg  as well. Not sure why it was taken off of her list.

## 2017-08-12 LAB — CULTURE, BODY FLUID-BOTTLE: CULTURE: NO GROWTH

## 2017-08-15 NOTE — Telephone Encounter (Signed)
OK to order neb from [employer]

## 2017-08-15 NOTE — Telephone Encounter (Signed)
Called and spoke with pts husband and he stated that the medication has been resolved.    He stated that the pt has the house oxygen and the POC from [employer].  He would like to cancel the order for the nebulizer from [facility] and reorder with [employer].  RA are you ok to get this set up for the pt.

## 2017-08-16 ENCOUNTER — Encounter: Payer: Self-pay | Admitting: Nurse Practitioner

## 2017-08-16 ENCOUNTER — Ambulatory Visit (HOSPITAL_COMMUNITY): Payer: PPO | Attending: Cardiovascular Disease

## 2017-08-16 ENCOUNTER — Other Ambulatory Visit: Payer: Self-pay

## 2017-08-16 ENCOUNTER — Ambulatory Visit (INDEPENDENT_AMBULATORY_CARE_PROVIDER_SITE_OTHER): Payer: PPO | Admitting: Nurse Practitioner

## 2017-08-16 VITALS — BP 118/60 | HR 62 | Ht 64.0 in | Wt 136.1 lb

## 2017-08-16 DIAGNOSIS — Z7901 Long term (current) use of anticoagulants: Secondary | ICD-10-CM

## 2017-08-16 DIAGNOSIS — I480 Paroxysmal atrial fibrillation: Secondary | ICD-10-CM | POA: Diagnosis not present

## 2017-08-16 DIAGNOSIS — I3139 Other pericardial effusion (noninflammatory): Secondary | ICD-10-CM

## 2017-08-16 DIAGNOSIS — Z79899 Other long term (current) drug therapy: Secondary | ICD-10-CM

## 2017-08-16 DIAGNOSIS — I313 Pericardial effusion (noninflammatory): Secondary | ICD-10-CM

## 2017-08-16 DIAGNOSIS — Z9889 Other specified postprocedural states: Secondary | ICD-10-CM | POA: Diagnosis not present

## 2017-08-16 DIAGNOSIS — I081 Rheumatic disorders of both mitral and tricuspid valves: Secondary | ICD-10-CM | POA: Diagnosis not present

## 2017-08-16 DIAGNOSIS — J90 Pleural effusion, not elsewhere classified: Secondary | ICD-10-CM | POA: Diagnosis not present

## 2017-08-16 LAB — ECHOCARDIOGRAM LIMITED
FS: 33 % (ref 28–44)
Height: 64 in
IVS/LV PW RATIO, ED: 0.99
LA ID, A-P, ES: 36 mm
LA diam end sys: 36 mm
LA diam index: 2.17 cm/m2
LV PW d: 10.1 mm — AB (ref 0.6–1.1)
LV e' LATERAL: 4.81 cm/s
Reg peak vel: 305 cm/s
TDI e' lateral: 4.81
TDI e' medial: 5.44
TR max vel: 305 cm/s
Weight: 2177.92 oz

## 2017-08-16 NOTE — Patient Instructions (Addendum)
We will be checking the following labs today - BMET, CBC, HPF, TSH   Medication Instructions:    Continue with your current medicines.     Testing/Procedures To Be Arranged:  Repeat limited echocardiogram  Follow-Up:   See Dr. Tamala Julian in about a month    Other Special Instructions:   N/A    If you need a refill on your cardiac medications before your next appointment, please call your pharmacy.   Call the [place] office at [phone] if you have any questions, problems or concerns.

## 2017-08-16 NOTE — Progress Notes (Signed)
CARDIOLOGY OFFICE NOTE  Date:  08/16/2017    Renee Vance Date of Birth: 06-27-1941 Medical Record #[id number]  PCP:  Renee Maize, MD  Cardiologist:  Renee Vance   Chief Complaint  Patient presents with  . Follow-up    Post hospital following pericardiocentesis - seen for Renee Vance    History of Present Illness: Renee Vance is a 76 y.o. female who presents today for a post hospital visit. Seen for Renee Vance.   She has a history of paroxysmal atrial fibrillation, coronary artery disease with drug-eluting stent in the distal RCA and intermediate stenosis in the LAD with cath in 2011, COPD, hyperlipidemia, obesity, and diagnosis of squamous cell carcinoma of the left upper lobe. She is status post radiochemotherapy with good response. She was going to consider possible left upper lobectomy - however noted that she had declined this option on prior visit with me back in June of 2018. FEV1 is 70%.  I saw her back this past summer of 2018 - she had not tolerated her chemo very well. Had admissions for both bleeding and neutropenia. Her coumadin was stopped at that time.  The risks associated with anticoagulation were felt to outweigh the benefits.   Most recently, she had a pericardial effusion noted on recent chest CT scan and noted to be large.  Echocardiography demonstrated a large effusion with right ventricular inversion but no true echocardiographic evidence of tamponade. Renee Vance spoke with Renee Vance, and they both agreed that percutaneous intervention was the best option.  She is on continuous oxygen therapy and a pericardial window which would require intubation could be an issue that would increase risk for pulmonary complications.  She underwent successful pericardiocentesis - 900 cc removed by Renee Vance. Echo the following day showed near resolution. Needed repeat echo in 2 weeks (not scheduled that I can see). She had atrial flutter with RVR with this  procedure - treated with amiodarone. Subsequently started on Eliquis - CHADSVASC of at least 5. Had some diastolic HF which was treated with IV Lasix with improvement. No growth noted on culture of the pericardial fluid. During pericardial drain placement, patient became tachypneic to RR 30 and Tachycardic to HR 120's. There was concern for possible pneumothorax seen on fluoroscopy. Patient was hemodynamically stable with Pox 100% on 3L O2. She was transferred to [facility] ICU where CXR showed Left mid-lung infiltrate unchanged from prior consistent with her known diagnosis of lung cancer. However it did not show any pneumothorax. By this time patient's RR had improved to 20's although she remained tachycardic and subsequently found to have atrial flutter.   Comes in today. Here with her husband. He was under the impression that she did have a pneumothorax - we cleared that misunderstanding up. She says she is doing ok. Her breathing has not really changed since the procedure. Will have some occasional wheezing.  She does occasionally cough up maroon colored sputum - she was doing this even prior to being put on Eliquis. No chest pain. No real palpitations noted. She remains on amiodarone. She remains on her oxygen. She paces herself at home. Overall, she feels like she is doing ok. Seeing oncology soon - she says they will discuss possible chemotherapy.   Past Medical History:  Diagnosis Date  . 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 Bradycardia   . CAD (coronary artery disease)   . Cellulitis of right forearm 05/10/2017  . COPD (chronic obstructive pulmonary  disease) ([place])   . Coronary atherosclerosis of native coronary artery 2011  . Encounter for antineoplastic chemotherapy 06/23/2016  . Fibrocystic breast   . History of radiation therapy 07/05/16 - 08/09/16    Left Lung: 45 Gy in 25 fractions  . HTN (hypertension)   . Hyperlipidemia   . Hypothyroidism   . Neutropenic fever ([place])  03/15/2017  . Obesity   . Pain in joint   . Right renal mass 11/30/2016  . 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 Thrombocytopenia ([place]) 03/15/2017    Past Surgical History:  Procedure Laterality Date  . CHOLECYSTECTOMY    . CORONARY ANGIOPLASTY WITH STENT PLACEMENT  2011   Lmain 30-40%, LAD 65-75% (FFR 0.88), CFX 55-60%, RCA 95%>0 w/ 2.5 x 12 mm monorail stent  . IR FLUORO GUIDE PORT INSERTION RIGHT  04/07/2017  . IR US GUIDE VASC ACCESS RIGHT  04/07/2017  . TUBAL LIGATION       Medications: Current Meds  Medication Sig  . acetaminophen (TYLENOL) 325 MG tablet Take 650 mg by mouth every 6 (six) hours as needed.  Marland Kitchen amiodarone (PACERONE) 200 MG tablet Take 400 mg (2 tables) twice daily for 2 weeks, then 200 mg  (1 tablet) daily thereafter.  Marland Kitchen amLODipine (NORVASC) 5 MG tablet Take 5 mg by mouth daily.  Marland Kitchen apixaban (ELIQUIS) 5 MG TABS tablet Take 1 tablet (5 mg total) by mouth 2 (two) times daily.  Marland Kitchen aspirin 81 MG tablet Take 81 mg by mouth daily.  Marland Kitchen atorvastatin (LIPITOR) 80 MG tablet Take 1 tablet (80 mg total) by mouth daily.  . budesonide (PULMICORT) 0.5 MG/2ML nebulizer solution Take 2 mLs (0.5 mg total) by nebulization 2 (two) times daily.  . ferrous sulfate 325 (65 FE) MG EC tablet Take 325 mg daily with breakfast by mouth.  Marland Kitchen ipratropium-albuterol (DUONEB) 0.5-2.5 (3) MG/3ML SOLN Take 3 mLs by nebulization every 6 (six) hours as needed.  Marland Kitchen levothyroxine (SYNTHROID, LEVOTHROID) 125 MCG tablet Take 125 mcg by mouth daily before breakfast.   . metoprolol succinate (TOPROL-XL) 50 MG 24 hr tablet TAKE ONE TABLET BY MOUTH TWICE DAILY  . Multiple Vitamins-Minerals (ICAPS AREDS FORMULA PO) Take 1 tablet 2 (two) times daily by mouth.  Marland Kitchen omeprazole (PRILOSEC) 20 MG capsule Take 30- 60 min before your first and last meals of the day  . predniSONE (DELTASONE) 10 MG tablet Take 1 tablet (10 mg total) by mouth daily with breakfast.  . Vitamin D, Cholecalciferol, 1000  UNITS CAPS Take 1,000 Units by mouth daily.   . [DISCONTINUED] apixaban (ELIQUIS) 5 MG TABS tablet Take 1 tablet (5 mg total) by mouth 2 (two) times daily. For 30 days free supply     Allergies: Allergies  Allergen Reactions  . Ampicillin Nausea And Vomiting    Has patient had a PCN reaction causing immediate rash, facial/tongue/throat swelling, SOB or lightheadedness with hypotension: no Has patient had a PCN reaction causing severe rash involving mucus membranes or skin necrosis: no Has patient had a PCN reaction that required hospitalization: no Has patient had a PCN reaction occurring within the last 10 years: no If all of the above answers are "NO", then may proceed with Cephalosporin use  . Codeine Nausea And Vomiting    Social History: The patient  reports that she quit smoking about 3 years ago. She has a 30.00 pack-year smoking history. she has never used smokeless tobacco. She reports that she does not drink alcohol or  use drugs.   Family History: The patient's family history includes Heart disease in her mother.   Review of Systems: Please see the history of present illness.   Otherwise, the review of systems is positive for none.   All other systems are reviewed and negative.   Physical Exam: VS:  BP 118/60 (BP Location: Left Arm, Patient Position: Sitting, Cuff Size: Normal)   Pulse 62   Ht 5\' 4"  (1.626 m)   Wt 136 lb 1.9 oz (61.7 kg)   SpO2 98% Comment: 4 L of O2/2 L 82 %  BMI 23.36 kg/m  .  BMI Body mass index is 23.36 kg/m.  Wt Readings from Last 3 Encounters:  08/16/17 136 lb 1.9 oz (61.7 kg)  08/07/17 137 lb 9.1 oz (62.4 kg)  08/04/17 142 lb 1.9 oz (64.5 kg)    General: Pleasant. Chronically ill but alert and in no acute distress. She has oxygen in place. O2 sats low on 2 liter - 82% - increased to 4l - up to 98%   HEENT: Normal.  Neck: Supple, no JVD, carotid bruits, or masses noted.  Cardiac: Regular rate and rhythm. No rub that I can appreciate on  exam.  No edema.  Respiratory:  Lungs are coarse - she has oxygen in place.  GI: Soft and nontender.  MS: No deformity or atrophy. Gait and ROM intact.  Skin: Warm and dry. Color is normal.  Neuro:  Strength and sensation are intact and no gross focal deficits noted.  Psych: Alert, appropriate and with normal affect.   LABORATORY DATA:  EKG:  EKG is ordered today. This demonstrates NSR - diffuse St and T wave changes. Septal Q's noted.  Lab Results  Component Value Date   WBC 10.0 08/09/2017   HGB 10.1 (L) 08/09/2017   HCT 33.1 (L) 08/09/2017   PLT 147 (L) 08/09/2017   GLUCOSE 92 08/10/2017   ALT 61 (H) 08/08/2017   AST 76 (H) 08/08/2017   NA 140 08/10/2017   K 4.8 08/10/2017   CL 98 (L) 08/10/2017   CREATININE 1.53 (H) 08/10/2017   BUN 29 (H) 08/10/2017   CO2 35 (H) 08/10/2017   TSH 0.619 03/17/2017   INR 1.1 08/04/2017   HGBA1C 5.7 (H) 10/30/2016     BNP (last 3 results) Recent Labs    10/30/16 0529 08/09/17 1016  BNP 963.1* 1,025.1*    ProBNP (last 3 results) No results for input(s): PROBNP in the last 8760 hours.   Other Studies Reviewed Today: PERICARDIOCENTESIS  08/07/17  Conclusion    Successful pericardiocentesis with removal of 900 cc of serous pericardial fluid. Moderate elevation of ventral pericardial pressure was noted upon entering the pericardial space, 16 mmHg mean.  Possible procedure related left lower lobe pneumothorax.  Atrial flutter developing in the interval between her office visit 72 hours ago and elective procedure today.  RECOMMENDATIONS:   Continue pericardial drain times 24-48 hours.  Critical care medicine was consulted concerning the pneumothorax. Patient will likely need a chest tube placed.  ECG upon arrival in the unit.  Echocardiogram in a.m.  Cytology, bacteriology, and chemical profile/analysis of the drain pericardial fluid has been sent.  May require electrical cardioversion of atrial flutter continues.  Amiodarone bolus and infusion.     Echo 08/08/17 (post pericardiocentesis)  Study Conclusions  - Left ventricle: The cavity size was normal. There was moderate concentric hypertrophy. Systolic function was normal. The estimated ejection fraction was in the range of 60% to 65%.  The study is not technically sufficient to allow evaluation of LV diastolic function. - Aortic valve: Mildly calcified leaflets. There was no stenosis. There was no regurgitation. - Mitral valve: Mildly thickened leaflets . There was mild regurgitation. - Left atrium: The atrium was mildly dilated. - Right ventricle: The cavity size was mildly dilated. Systolic function was normal. - Right atrium: The atrium was normal in size. - Atrial septum: There was increased thickness of the septum, consistent with lipomatous hypertrophy. - Pulmonary arteries: PA peak pressure: 41 mm Hg (S). - Pericardium, extracardiac: A trivial pericardial effusion was identified posterior to the heart. Features were not consistent with tamponade physiology.  Impressions:  - Compared to a prior study on 08/01/2017, there is now only a trivial posterior pericardial effusion.   Assessment/Plan:  1. Pericardial effusion: she has had removal of 900 cc of fluid with pericardiocentesis. Noted that the echo the following day showed near resolution. Needs repeat echo. Able to have done today so we will proceed with repeat limited study.  Studies look to be ok upon my review and from what I see in Epic - still concerning to me that this was malignant. Clinically she appears ok.   2. Atrial flutter with RVR: occurred during the above procedure. Now on amiodarone - tapering dose. Back on anticoagulation - now with Eliquis. She has some hemoptysis - says this is unchanged from what she was having before being placed on Eliquis - will need close follow up. Repeat lab today.   3. Acute on chronic respiratory  failure with hypoxia - most likely multifactorial - contribution from diastolic heart failure, pulmonary edema, radiation fibrosis, COPD and known lung cancer.   4. Acute on chronic kidney injury: Renal function is back to baseline. Getting lab today  5. Squamous cell lung cancer - seeing oncology soon.   6. Chronic anticoagulation - high risk for recurrent bleeding (has had previously)  7. Elevated LFTs - recheck today   Overall prognosis tenuous at best.  Current medicines are reviewed with the patient today.  The patient does not have concerns regarding medicines other than what has been noted above.  The following changes have been made:  See above.  Labs/ tests ordered today include:    Orders Placed This Encounter  Procedures  . Basic metabolic panel  . CBC  . Hepatic function panel  . TSH  . EKG 12-Lead  . ECHOCARDIOGRAM LIMITED     Disposition:   FU with Renee Vance in one month. Limited echo being done here today. Lab today.    Patient is agreeable to this plan and will call if any problems develop in the interim.   SignedTruitt Merle, NP  08/16/2017 2:36 PM  [facility] [address] [place], [place]  [address] Phone: [phone] Fax: [phone]

## 2017-08-17 ENCOUNTER — Telehealth: Payer: Self-pay | Admitting: *Deleted

## 2017-08-17 DIAGNOSIS — J90 Pleural effusion, not elsewhere classified: Secondary | ICD-10-CM

## 2017-08-17 LAB — CBC
Hematocrit: 31.5 % — ABNORMAL LOW (ref 34.0–46.6)
Hemoglobin: 10.4 g/dL — ABNORMAL LOW (ref 11.1–15.9)
MCH: 32.3 pg (ref 26.6–33.0)
MCHC: 33 g/dL (ref 31.5–35.7)
MCV: 98 fL — ABNORMAL HIGH (ref 79–97)
Platelets: 221 10*3/uL (ref 150–379)
RBC: 3.22 x10E6/uL — ABNORMAL LOW (ref 3.77–5.28)
RDW: 17.5 % — ABNORMAL HIGH (ref 12.3–15.4)
WBC: 7.7 10*3/uL (ref 3.4–10.8)

## 2017-08-17 LAB — HEPATIC FUNCTION PANEL
ALT: 41 IU/L — ABNORMAL HIGH (ref 0–32)
AST: 40 IU/L (ref 0–40)
Albumin: 3.5 g/dL (ref 3.5–4.8)
Alkaline Phosphatase: 81 IU/L (ref 39–117)
Bilirubin Total: 0.7 mg/dL (ref 0.0–1.2)
Bilirubin, Direct: 0.27 mg/dL (ref 0.00–0.40)
Total Protein: 6.4 g/dL (ref 6.0–8.5)

## 2017-08-17 LAB — BASIC METABOLIC PANEL
BUN/Creatinine Ratio: 20 (ref 12–28)
BUN: 37 mg/dL — ABNORMAL HIGH (ref 8–27)
CO2: 34 mmol/L — ABNORMAL HIGH (ref 20–29)
Calcium: 9.1 mg/dL (ref 8.7–10.3)
Chloride: 95 mmol/L — ABNORMAL LOW (ref 96–106)
Creatinine, Ser: 1.88 mg/dL — ABNORMAL HIGH (ref 0.57–1.00)
GFR calc Af Amer: 29 mL/min/{1.73_m2} — ABNORMAL LOW (ref >59)
GFR calc non Af Amer: 25 mL/min/{1.73_m2} — ABNORMAL LOW (ref >59)
Glucose: 124 mg/dL — ABNORMAL HIGH (ref 65–99)
Potassium: 4.8 mmol/L (ref 3.5–5.2)
Sodium: 139 mmol/L (ref 134–144)

## 2017-08-17 LAB — TSH: TSH: 0.463 u[IU]/mL (ref 0.450–4.500)

## 2017-08-17 NOTE — Addendum Note (Signed)
Addended by: Loren Racer on: 08/17/2017 09:27 AM   Modules accepted: Orders

## 2017-08-17 NOTE — Telephone Encounter (Addendum)
Notes recorded by Burtis Junes, NP on 08/17/2017 at 7:09 AM EST Valetta Fuller,  See lab note. Forward to [place] if she is there today.  See Dr. Antionette Char recommendations - he agrees with stopping Eliquis, has advised repeat echo next week and arranging OV with Dr. Tamala Julian to discuss options, goals, etc., in light of the recurrent effusion.   Spoke with pt and went over results and recommendations per Cecille Rubin and Dr. Burt Knack.  Pt scheduled for echo 11/15 and will come in to see Dr. Tamala Julian 11/20.  Pt aware to stop Eliquis. Pt verbalized understanding and was in agreement with this plan.

## 2017-08-18 ENCOUNTER — Other Ambulatory Visit: Payer: Self-pay | Admitting: Pediatrics

## 2017-08-18 MED ORDER — LEVOTHYROXINE SODIUM 125 MCG PO TABS: 125.0000 ug | ORAL_TABLET | Freq: Every day | ORAL | 1 refills | Status: AC

## 2017-08-18 NOTE — Telephone Encounter (Signed)
Refill sent to pharmacy.   

## 2017-08-21 ENCOUNTER — Inpatient Hospital Stay (HOSPITAL_COMMUNITY): Admission: RE | Admit: 2017-08-21 | Payer: PPO | Source: Ambulatory Visit

## 2017-08-22 ENCOUNTER — Ambulatory Visit: Payer: PPO

## 2017-08-22 ENCOUNTER — Other Ambulatory Visit (HOSPITAL_BASED_OUTPATIENT_CLINIC_OR_DEPARTMENT_OTHER): Payer: PPO

## 2017-08-22 ENCOUNTER — Telehealth: Payer: Self-pay | Admitting: Pulmonary Disease

## 2017-08-22 ENCOUNTER — Ambulatory Visit (HOSPITAL_COMMUNITY)
Admission: RE | Admit: 2017-08-22 | Discharge: 2017-08-22 | Disposition: A | Discharge status: Home / Self Care | Payer: PPO | Source: Ambulatory Visit | Attending: Oncology | Admitting: Oncology

## 2017-08-22 DIAGNOSIS — I313 Pericardial effusion (noninflammatory): Secondary | ICD-10-CM | POA: Diagnosis not present

## 2017-08-22 DIAGNOSIS — J8410 Pulmonary fibrosis, unspecified: Secondary | ICD-10-CM | POA: Insufficient documentation

## 2017-08-22 DIAGNOSIS — J90 Pleural effusion, not elsewhere classified: Secondary | ICD-10-CM | POA: Diagnosis not present

## 2017-08-22 DIAGNOSIS — R590 Localized enlarged lymph nodes: Secondary | ICD-10-CM | POA: Diagnosis not present

## 2017-08-22 DIAGNOSIS — C3492 Malignant neoplasm of unspecified part of left bronchus or lung: Secondary | ICD-10-CM

## 2017-08-22 DIAGNOSIS — E279 Disorder of adrenal gland, unspecified: Secondary | ICD-10-CM | POA: Insufficient documentation

## 2017-08-22 DIAGNOSIS — C7972 Secondary malignant neoplasm of left adrenal gland: Secondary | ICD-10-CM

## 2017-08-22 DIAGNOSIS — J700 Acute pulmonary manifestations due to radiation: Secondary | ICD-10-CM

## 2017-08-22 DIAGNOSIS — C3410 Malignant neoplasm of upper lobe, unspecified bronchus or lung: Secondary | ICD-10-CM | POA: Diagnosis not present

## 2017-08-22 DIAGNOSIS — J449 Chronic obstructive pulmonary disease, unspecified: Secondary | ICD-10-CM | POA: Diagnosis not present

## 2017-08-22 DIAGNOSIS — N289 Disorder of kidney and ureter, unspecified: Secondary | ICD-10-CM | POA: Diagnosis not present

## 2017-08-22 DIAGNOSIS — D1771 Benign lipomatous neoplasm of kidney: Secondary | ICD-10-CM | POA: Diagnosis not present

## 2017-08-22 DIAGNOSIS — Z95828 Presence of other vascular implants and grafts: Secondary | ICD-10-CM

## 2017-08-22 LAB — COMPREHENSIVE METABOLIC PANEL
ALT: 29 U/L (ref 0–55)
AST: 28 U/L (ref 5–34)
Albumin: 2.8 g/dL — ABNORMAL LOW (ref 3.5–5.0)
Alkaline Phosphatase: 69 U/L (ref 40–150)
Anion Gap: 6 mEq/L (ref 3–11)
BILIRUBIN TOTAL: 0.63 mg/dL (ref 0.20–1.20)
BUN: 39.4 mg/dL — ABNORMAL HIGH (ref 7.0–26.0)
CO2: 32 meq/L — AB (ref 22–29)
Calcium: 9.6 mg/dL (ref 8.4–10.4)
Chloride: 101 mEq/L (ref 98–109)
Creatinine: 1.5 mg/dL — ABNORMAL HIGH (ref 0.6–1.1)
EGFR: 32 mL/min/{1.73_m2} — AB (ref >60)
GLUCOSE: 86 mg/dL (ref 70–140)
Potassium: 5 mEq/L (ref 3.5–5.1)
SODIUM: 139 meq/L (ref 136–145)
TOTAL PROTEIN: 6.8 g/dL (ref 6.4–8.3)

## 2017-08-22 LAB — CBC WITH DIFFERENTIAL/PLATELET
BASO%: 0.3 % (ref 0.0–2.0)
Basophils Absolute: 0 10*3/uL (ref 0.0–0.1)
EOS%: 0.3 % (ref 0.0–7.0)
Eosinophils Absolute: 0 10*3/uL (ref 0.0–0.5)
HCT: 28.3 % — ABNORMAL LOW (ref 34.8–46.6)
HGB: 9.1 g/dL — ABNORMAL LOW (ref 11.6–15.9)
LYMPH%: 2.1 % — AB (ref 14.0–49.7)
MCH: 31.8 pg (ref 25.1–34.0)
MCHC: 32 g/dL (ref 31.5–36.0)
MCV: 99.2 fL (ref 79.5–101.0)
MONO#: 0.6 10*3/uL (ref 0.1–0.9)
MONO%: 6.5 % (ref 0.0–14.0)
NEUT%: 90.8 % — ABNORMAL HIGH (ref 38.4–76.8)
NEUTROS ABS: 8.4 10*3/uL — AB (ref 1.5–6.5)
Platelets: 192 10*3/uL (ref 145–400)
RBC: 2.85 10*6/uL — AB (ref 3.70–5.45)
RDW: 18.6 % — ABNORMAL HIGH (ref 11.2–14.5)
WBC: 9.3 10*3/uL (ref 3.9–10.3)
lymph#: 0.2 10*3/uL — ABNORMAL LOW (ref 0.9–3.3)

## 2017-08-22 MED ORDER — PREDNISONE 10 MG PO TABS: 10.0000 mg | ORAL_TABLET | Freq: Every day | ORAL | 0 refills | Status: AC

## 2017-08-22 MED ORDER — IOPAMIDOL (ISOVUE-300) INJECTION 61%
INTRAVENOUS | Status: AC
  Administered 2017-08-22: 80 mL
  Filled 2017-08-22: qty 100

## 2017-08-22 MED ORDER — SODIUM CHLORIDE 0.9% FLUSH
10.0000 mL | Freq: Once | INTRAVENOUS | Status: AC
  Administered 2017-08-22: 10 mL
  Filled 2017-08-22: qty 10

## 2017-08-22 MED ORDER — HEPARIN SOD (PORK) LOCK FLUSH 100 UNIT/ML IV SOLN
INTRAVENOUS | Status: AC
  Filled 2017-08-22: qty 5

## 2017-08-22 NOTE — Telephone Encounter (Signed)
Pharm is [facility] in [place].

## 2017-08-22 NOTE — Telephone Encounter (Signed)
Pt was advised to take 10mg  pred daily, but has ran out.  Refill called in to last until next OV.  Nothing further needed.

## 2017-08-23 ENCOUNTER — Encounter: Payer: Self-pay | Admitting: Oncology

## 2017-08-23 ENCOUNTER — Ambulatory Visit (HOSPITAL_BASED_OUTPATIENT_CLINIC_OR_DEPARTMENT_OTHER): Payer: PPO | Admitting: Oncology

## 2017-08-23 ENCOUNTER — Ambulatory Visit
Admission: RE | Admit: 2017-08-23 | Discharge: 2017-08-23 | Disposition: A | Discharge status: Home / Self Care | Payer: PPO | Source: Ambulatory Visit | Attending: Radiation Oncology | Admitting: Radiation Oncology

## 2017-08-23 VITALS — BP 120/60 | HR 58 | Temp 98.1°F | Resp 18 | Ht 64.0 in | Wt 143.2 lb

## 2017-08-23 DIAGNOSIS — R0602 Shortness of breath: Secondary | ICD-10-CM | POA: Diagnosis not present

## 2017-08-23 DIAGNOSIS — R05 Cough: Secondary | ICD-10-CM

## 2017-08-23 DIAGNOSIS — C3492 Malignant neoplasm of unspecified part of left bronchus or lung: Secondary | ICD-10-CM | POA: Diagnosis not present

## 2017-08-23 DIAGNOSIS — Z880 Allergy status to penicillin: Secondary | ICD-10-CM | POA: Insufficient documentation

## 2017-08-23 DIAGNOSIS — Z7982 Long term (current) use of aspirin: Secondary | ICD-10-CM | POA: Diagnosis not present

## 2017-08-23 DIAGNOSIS — J90 Pleural effusion, not elsewhere classified: Secondary | ICD-10-CM | POA: Diagnosis not present

## 2017-08-23 DIAGNOSIS — Z885 Allergy status to narcotic agent status: Secondary | ICD-10-CM | POA: Insufficient documentation

## 2017-08-23 DIAGNOSIS — C7972 Secondary malignant neoplasm of left adrenal gland: Secondary | ICD-10-CM | POA: Diagnosis not present

## 2017-08-23 DIAGNOSIS — Z79899 Other long term (current) drug therapy: Secondary | ICD-10-CM | POA: Diagnosis not present

## 2017-08-23 DIAGNOSIS — Z7952 Long term (current) use of systemic steroids: Secondary | ICD-10-CM | POA: Insufficient documentation

## 2017-08-23 DIAGNOSIS — E279 Disorder of adrenal gland, unspecified: Secondary | ICD-10-CM | POA: Diagnosis not present

## 2017-08-23 DIAGNOSIS — N2889 Other specified disorders of kidney and ureter: Secondary | ICD-10-CM

## 2017-08-23 DIAGNOSIS — I313 Pericardial effusion (noninflammatory): Secondary | ICD-10-CM | POA: Insufficient documentation

## 2017-08-23 DIAGNOSIS — Y842 Radiological procedure and radiotherapy as the cause of abnormal reaction of the patient, or of later complication, without mention of misadventure at the time of the procedure: Secondary | ICD-10-CM | POA: Insufficient documentation

## 2017-08-23 NOTE — Progress Notes (Signed)
Renee Vance is here for follow up after treatment to her left kidney. She denies having pain but is having shortness of breath.  She reports having occasional cough with cranberry colored sputum.  She said her sputum today was clear.  She will be having a thoracentesis on Friday and also said she needs another pericardiocentesis.  She will have an echo tomorrow.  She reports having a good energy level and denies having any skin irritation in the treatment area.  Vitals taken in Med Onc today:  bp 120/60, hr 58, rr 18, temp 98.1 and oxygen sat 100% on 4 L.

## 2017-08-23 NOTE — Progress Notes (Addendum)
Radiation Oncology         [phone] ________________________________  Name: Renee Vance MRN: [id number]  Date: 08/23/2017  DOB: Feb 16, 1941  Follow-Up Visit Note  CC: [name], MD  [name], MD    ICD-10-CM   1. Stage IV squamous cell carcinoma of left lung [facility]) C34.92    Diagnosis:   Stage IIB(T2a, N1, M0) non-small cell lung cancer, squamous cell carcinoma,presented with left hilar mass, now with an enlarging left adrenal mass (oligometastasis).  Interval Since Last Radiation: 2  months  Narrative:  The patient returns today for routine follow-up for her left adrenal mass. She denies any pain but she reports that she is having continued shortness of breath. She is scheduled for a thoracentesis on Friday to remove the fluid from her chest. She also reports that she will have an occasional cough which is productive of cranberry colored sputum, but this is manageable. She denies any chest pain associated with this. She currently receives 4L O2 via [place].  She denies any flank pain.     ALLERGIES:  is allergic to ampicillin and codeine.  Meds: Current Outpatient Medications  Medication Sig Dispense Refill  . acetaminophen (TYLENOL) 325 MG tablet Take 650 mg by mouth every 6 (six) hours as needed.    Marland Kitchen amiodarone (PACERONE) 200 MG tablet Take 400 mg (2 tables) twice daily for 2 weeks, then 200 mg  (1 tablet) daily thereafter. 70 tablet 0  . amLODipine (NORVASC) 5 MG tablet Take 5 mg by mouth daily.    Marland Kitchen aspirin 81 MG tablet Take 81 mg by mouth daily.    Marland Kitchen atorvastatin (LIPITOR) 80 MG tablet Take 1 tablet (80 mg total) by mouth daily. 90 tablet 1  . budesonide (PULMICORT) 0.5 MG/2ML nebulizer solution Take 2 mLs (0.5 mg total) by nebulization 2 (two) times daily. 120 mL 3  . ferrous sulfate 325 (65 FE) MG EC tablet Take 325 mg daily with breakfast by mouth.    Marland Kitchen ipratropium-albuterol (DUONEB) 0.5-2.5 (3) MG/3ML SOLN Take 3 mLs by nebulization every 6 (six)  hours as needed. 360 mL 0  . levothyroxine (SYNTHROID, LEVOTHROID) 125 MCG tablet Take 1 tablet (125 mcg total) daily before breakfast by mouth. 30 tablet 1  . metoprolol succinate (TOPROL-XL) 50 MG 24 hr tablet TAKE ONE TABLET BY MOUTH TWICE DAILY 180 tablet 3  . Multiple Vitamins-Minerals (ICAPS AREDS FORMULA PO) Take 1 tablet 2 (two) times daily by mouth.    Marland Kitchen omeprazole (PRILOSEC) 20 MG capsule Take 30- 60 min before your first and last meals of the day 60 capsule 2  . predniSONE (DELTASONE) 10 MG tablet Take 1 tablet (10 mg total) daily with breakfast by mouth. 20 tablet 0  . Vitamin D, Cholecalciferol, 1000 UNITS CAPS Take 1,000 Units by mouth daily.      No current facility-administered medications for this encounter.     Physical Findings: The patient is in no acute distress. Patient is alert and oriented. Decreased breath sounds on the left. She is receiving oxygen via [place] at 4L. Heart has regular rate and rhythm. No palpable cervical, supraclavicular, or axillary adenopathy. Abdomen soft, non-tender, normal bowel sounds.   Lab Findings: Lab Results  Component Value Date   WBC 9.3 08/22/2017   HGB 9.1 (L) 08/22/2017   HCT 28.3 (L) 08/22/2017   MCV 99.2 08/22/2017   PLT 192 08/22/2017    Radiographic Findings: Dg Chest 2 View  Result Date: 07/31/2017 CLINICAL DATA:  Lung cancer.  Chemotherapy and radiation. EXAM: CHEST  2 VIEW COMPARISON:  Two-view chest x-ray a 06/20/2017 and 05/18/2017 FINDINGS: Heart is enlarged. Bilateral fibrotic changes are again noted. A right IJ Port-A-Cath is in place. Left-sided airspace opacity is again seen. Chronic left pleural effusion is stable. IMPRESSION: 1. No acute cardiopulmonary disease or significant interval change. 2. Stable cardiomegaly without failure. 3. Similar appearance of pulmonary fibrosis. 4. Stable areas of airspace opacity in the left lung compatible with treated tumor. 5. Chronic left pleural effusion. Electronically Signed    By: San Morelle M.D.   On: 07/31/2017 15:15   Ct Chest W Contrast  Result Date: 08/22/2017 CLINICAL DATA:  Patient with history of stage IV squamous cell carcinoma of the left lung. Shortness of breath and weight loss. EXAM: CT CHEST, ABDOMEN, AND PELVIS WITH CONTRAST TECHNIQUE: Multidetector CT imaging of the chest, abdomen and pelvis was performed following the standard protocol during bolus administration of intravenous contrast. CONTRAST:  58mL ISOVUE-300 IOPAMIDOL (ISOVUE-300) INJECTION 61% COMPARISON:  CT chest 03/03/2017 ; CT CAP 05/18/2017 FINDINGS: CT CHEST FINDINGS Cardiovascular: Right anterior chest wall Port-A-Cath is present with tip terminating in the superior vena cava. Interval development of a moderate pericardial effusion. Coronary arterial vascular calcifications. Thoracic aortic vascular calcifications. Mediastinum/Nodes: Unchanged 11 mm pretracheal lymph node (image 21; series 2). Interval increase in size of prevascular lymph node measuring 9 mm (image 23; series 2), previously 5 mm. Interval increase in size of precarinal lymph node measuring 9 mm (image 24; series 2), previously 6 mm. Esophagus is normal in appearance. No axillary adenopathy. Lungs/Pleura: Central airways are patent. Re- demonstrated bilateral subpleural reticular opacities with associated architectural distortion. Interval increase in size of moderate left pleural effusion and associated pleural thickening/ irregularity. Re- demonstrated masslike consolidative opacity within the left mid lung likely secondary to post radiation changes. No pneumothorax. Musculoskeletal: Thoracic spine degenerative changes. No aggressive or acute appearing osseous lesions. CT ABDOMEN PELVIS FINDINGS Hepatobiliary: Liver is normal in size and contour. No focal hepatic lesion is identified. Gallbladder surgically absent. Mild intrahepatic biliary ductal dilatation. Common bile duct is normal in diameter. Pancreas: Unremarkable  Spleen: Unremarkable Adrenals/Urinary Tract: Interval decrease in size of the left adrenal mass measuring 0.9 x 0.7 cm (image 64; series 2), previously 1.3 x 2.5 cm. Right adrenal gland is normal. There is a 2.2 x 2.0 cm mass within the interpolar region of the right kidney (image 69; series 2) with suggestion of heterogeneous enhancement. Cyst interpolar region left kidney. Urinary bladder is decompressed. Stomach/Bowel: Mild wall thickening of the distal colon. No free fluid or free intraperitoneal air. Normal morphology of the stomach. No evidence for bowel obstruction. Vascular/Lymphatic: Normal caliber abdominal aorta. Peripheral calcified atherosclerotic plaque. No retroperitoneal lymphadenopathy. Reproductive: Uterus and adnexal structures are unremarkable. Other: None. Musculoskeletal: Lumbar spine degenerative changes. No aggressive or acute appearing osseous lesions. IMPRESSION: 1. Interval decrease in size of mass within the lateral limb left adrenal gland. 2. Interval increase in size of mediastinal adenopathy. 3. Interval increase in size of moderate pericardial effusion. 4. Interval increase in size of moderate left pleural effusion. 5. Similar-appearing pulmonary fibrosis and regions of airspace opacity in the left lung. 6. Within the medial interpolar region of the right kidney there is suggestion of and enhancing renal mass. Recommend dedicated evaluation with pre and post contrast-enhanced CT or MRI however overall findings are concerning for the possibility of a renal cell carcinoma. Electronically Signed   By: Polly Cobia.D.  On: 08/22/2017 16:55   Ct Abdomen Pelvis W Contrast  Result Date: 08/22/2017 CLINICAL DATA:  Patient with history of stage IV squamous cell carcinoma of the left lung. Shortness of breath and weight loss. EXAM: CT CHEST, ABDOMEN, AND PELVIS WITH CONTRAST TECHNIQUE: Multidetector CT imaging of the chest, abdomen and pelvis was performed following the standard protocol  during bolus administration of intravenous contrast. CONTRAST:  45mL ISOVUE-300 IOPAMIDOL (ISOVUE-300) INJECTION 61% COMPARISON:  CT chest 03/03/2017 ; CT CAP 05/18/2017 FINDINGS: CT CHEST FINDINGS Cardiovascular: Right anterior chest wall Port-A-Cath is present with tip terminating in the superior vena cava. Interval development of a moderate pericardial effusion. Coronary arterial vascular calcifications. Thoracic aortic vascular calcifications. Mediastinum/Nodes: Unchanged 11 mm pretracheal lymph node (image 21; series 2). Interval increase in size of prevascular lymph node measuring 9 mm (image 23; series 2), previously 5 mm. Interval increase in size of precarinal lymph node measuring 9 mm (image 24; series 2), previously 6 mm. Esophagus is normal in appearance. No axillary adenopathy. Lungs/Pleura: Central airways are patent. Re- demonstrated bilateral subpleural reticular opacities with associated architectural distortion. Interval increase in size of moderate left pleural effusion and associated pleural thickening/ irregularity. Re- demonstrated masslike consolidative opacity within the left mid lung likely secondary to post radiation changes. No pneumothorax. Musculoskeletal: Thoracic spine degenerative changes. No aggressive or acute appearing osseous lesions. CT ABDOMEN PELVIS FINDINGS Hepatobiliary: Liver is normal in size and contour. No focal hepatic lesion is identified. Gallbladder surgically absent. Mild intrahepatic biliary ductal dilatation. Common bile duct is normal in diameter. Pancreas: Unremarkable Spleen: Unremarkable Adrenals/Urinary Tract: Interval decrease in size of the left adrenal mass measuring 0.9 x 0.7 cm (image 64; series 2), previously 1.3 x 2.5 cm. Right adrenal gland is normal. There is a 2.2 x 2.0 cm mass within the interpolar region of the right kidney (image 69; series 2) with suggestion of heterogeneous enhancement. Cyst interpolar region left kidney. Urinary bladder is  decompressed. Stomach/Bowel: Mild wall thickening of the distal colon. No free fluid or free intraperitoneal air. Normal morphology of the stomach. No evidence for bowel obstruction. Vascular/Lymphatic: Normal caliber abdominal aorta. Peripheral calcified atherosclerotic plaque. No retroperitoneal lymphadenopathy. Reproductive: Uterus and adnexal structures are unremarkable. Other: None. Musculoskeletal: Lumbar spine degenerative changes. No aggressive or acute appearing osseous lesions. IMPRESSION: 1. Interval decrease in size of mass within the lateral limb left adrenal gland. 2. Interval increase in size of mediastinal adenopathy. 3. Interval increase in size of moderate pericardial effusion. 4. Interval increase in size of moderate left pleural effusion. 5. Similar-appearing pulmonary fibrosis and regions of airspace opacity in the left lung. 6. Within the medial interpolar region of the right kidney there is suggestion of and enhancing renal mass. Recommend dedicated evaluation with pre and post contrast-enhanced CT or MRI however overall findings are concerning for the possibility of a renal cell carcinoma. Electronically Signed   By: Lovey Newcomer M.D.   On: 08/22/2017 16:55   Dg Chest Port 1 View  Result Date: 08/09/2017 CLINICAL DATA:  Shortness of Breath EXAM: PORTABLE CHEST 1 VIEW COMPARISON:  August 07, 2017 and June 09, 2017; chest CT May 18, 2017 FINDINGS: There is underlying pulmonary fibrosis. There is a Port-A-Cath with tip in superior vena cava. No pneumothorax. Previously noted chest tube on the left is been removed. There is loculated pleural effusion on the right with areas of patchy airspace consolidation in the left perihilar and left base regions, stable. No new opacity evident. Heart is mildly enlarged. The  pulmonary vascularity is within normal limits. There is aortic atherosclerosis. No evident adenopathy. No bone lesions. IMPRESSION: Underlying pulmonary fibrosis. Perihilar  airspace consolidation on the left as well as patchy infiltrate in the left base remain. There are areas of loculated pleural effusion on the left, stable. Stable cardiac prominence. Note that on prior CT, a pericardial effusion contributing to the cardiac enlargement. Aortic atherosclerosis present. No adenopathy demonstrable by radiography. Aortic Atherosclerosis (ICD10-I70.0). Electronically Signed   By: Lowella Grip III M.D.   On: 08/09/2017 10:40   Dg Chest Port 1 View  Result Date: 08/07/2017 CLINICAL DATA:  Left-sided pneumothorax follow-up study EXAM: PORTABLE CHEST 1 VIEW COMPARISON:  Chest x-ray of July 31, 2017. FINDINGS: The right lung is well-expanded. The interstitial markings are mildly prominent though stable. On the left there is persistent volume loss. Pleural fluid or pleural thickening is present along the lateral aspect of the left upper lobe. The small caliber chest tube tip projects over the posteromedial aspect of the left seventh rib. There is calcification in the wall of the aortic arch. The power port catheter tip projects over the midportion of the SVC. The cardiac silhouette is mildly enlarged but stable. The left heart border remains obscured. IMPRESSION: No evidence of a pneumothorax. Stable volume loss on the left which may reflect pleural thickening or bite or loculated pleural fluid. New small caliber left-sided chest tube. Cardiomegaly without pulmonary vascular congestion. Electronically Signed   By: David  Martinique M.D.   On: 08/07/2017 17:04    Impression:  The patient is recovering from the effects of radiation. Her adrenal mass has decreased in size per CT CAP yesterday. However, her chest CT showed interval progression in the chest. She will proceed with thoracentesis later this week. She will also have echo later this week and may need drainage of her pericardial effusion.  Plan:  PRN f/u in rad onc.   ____________________________________  Blair Promise, PhD, MD  This document serves as a record of services personally performed by Gery Pray, MD. It was created on his behalf by Reola Mosher, a trained medical scribe. The creation of this record is based on the scribe's personal observations and the provider's statements to them. This document has been checked and approved by the attending provider.

## 2017-08-24 ENCOUNTER — Telehealth: Payer: Self-pay

## 2017-08-24 ENCOUNTER — Other Ambulatory Visit: Payer: Self-pay

## 2017-08-24 ENCOUNTER — Encounter: Payer: Self-pay | Admitting: Oncology

## 2017-08-24 ENCOUNTER — Ambulatory Visit (HOSPITAL_COMMUNITY): Payer: PPO | Attending: Cardiology

## 2017-08-24 DIAGNOSIS — J449 Chronic obstructive pulmonary disease, unspecified: Secondary | ICD-10-CM | POA: Diagnosis not present

## 2017-08-24 DIAGNOSIS — E785 Hyperlipidemia, unspecified: Secondary | ICD-10-CM | POA: Diagnosis not present

## 2017-08-24 DIAGNOSIS — Z85528 Personal history of other malignant neoplasm of kidney: Secondary | ICD-10-CM | POA: Diagnosis not present

## 2017-08-24 DIAGNOSIS — J90 Pleural effusion, not elsewhere classified: Secondary | ICD-10-CM

## 2017-08-24 DIAGNOSIS — Z923 Personal history of irradiation: Secondary | ICD-10-CM | POA: Diagnosis not present

## 2017-08-24 DIAGNOSIS — Z8249 Family history of ischemic heart disease and other diseases of the circulatory system: Secondary | ICD-10-CM | POA: Insufficient documentation

## 2017-08-24 DIAGNOSIS — Z85118 Personal history of other malignant neoplasm of bronchus and lung: Secondary | ICD-10-CM | POA: Insufficient documentation

## 2017-08-24 DIAGNOSIS — Z9221 Personal history of antineoplastic chemotherapy: Secondary | ICD-10-CM | POA: Diagnosis not present

## 2017-08-24 DIAGNOSIS — I313 Pericardial effusion (noninflammatory): Secondary | ICD-10-CM | POA: Insufficient documentation

## 2017-08-24 DIAGNOSIS — I2510 Atherosclerotic heart disease of native coronary artery without angina pectoris: Secondary | ICD-10-CM | POA: Diagnosis not present

## 2017-08-24 DIAGNOSIS — Z87891 Personal history of nicotine dependence: Secondary | ICD-10-CM | POA: Diagnosis not present

## 2017-08-24 DIAGNOSIS — I10 Essential (primary) hypertension: Secondary | ICD-10-CM | POA: Insufficient documentation

## 2017-08-24 MED ORDER — COLCHICINE 0.6 MG PO TABS: ORAL_TABLET | ORAL | 0 refills | Status: DC

## 2017-08-24 NOTE — Progress Notes (Signed)
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  PRIOR THERAPY:  1) Concurrent chemoradiation with weekly carboplatin for AUC of 2 and paclitaxel 45 MG/M2 started 07/05/2016, status post 5 cycles. Last cycle was given 08/01/2016. 2) Systemic chemotherapy with carboplatin for AUC of 5 on day 1 and gemcitabine 1000 MG/M2 on days 1 and 8 every 3 weeks. Status post 2 cycles. Starting from cycle #2 carboplatin will be reduced to AUC of 3 on day 1 and gemcitabine 800 MG/M2 on days 1 and 8 every 3 weeks secondary to intolerance to the standard doses.  CURRENT THERAPY: Observation  INTERVAL HISTORY: Renee Vance 76 y.o. female returns for routine follow-up visit with her husband.  Patient is having more difficulty with shortness of breath.  She gets very short of breath even with getting dressed in the morning.  Since her last visit, she underwent a pericardiocentesis and 900 cc of fluid was removed.  Fluid was sent for cytology which was negative for malignancy.  The patient has a repeat echocardiogram scheduled for tomorrow.  The patient is being followed closely by cardiology and pulmonology.  Patient denies fevers and chills.  Denies chest pain or hemoptysis.  She does have a nonproductive cough.  Denies nausea, vomiting, constipation, diarrhea.  Patient had a recent restaging CT scan of the chest, abdomen, pelvis and is here to discuss the results.  MEDICAL HISTORY: Past Medical History:  Diagnosis Date  . 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 Bradycardia   . CAD (coronary artery disease)   . Cellulitis of right forearm 05/10/2017  . COPD (chronic obstructive pulmonary disease) ([place])   . Coronary atherosclerosis of  native coronary artery 2011  . Encounter for antineoplastic chemotherapy 06/23/2016  . Fibrocystic breast   . History of radiation therapy 07/05/16 - 08/09/16    Left Lung: 45 Gy in 25 fractions  . History of radiation therapy 06/14/2017-06/23/2017   left kidney was treated to 40 Gy in 5 fractions  . HTN (hypertension)   . Hyperlipidemia   . Hypothyroidism   . Neutropenic fever ([place]) 03/15/2017  . Obesity   . Pain in joint   . Right renal mass 11/30/2016  . 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 Thrombocytopenia ([place]) 03/15/2017    ALLERGIES:  is allergic to ampicillin and codeine.  MEDICATIONS:  Current Outpatient Medications  Medication Sig Dispense Refill  . acetaminophen (TYLENOL) 325 MG tablet Take 650 mg by mouth every 6 (six) hours as needed.    Marland Kitchen amiodarone (PACERONE) 200 MG tablet Take 400 mg (2 tables) twice daily for 2 weeks, then 200 mg  (1 tablet) daily thereafter. 70 tablet 0  . amLODipine (NORVASC) 5 MG tablet Take 5 mg by mouth daily.    Marland Kitchen aspirin 81 MG tablet Take 81 mg by mouth daily.    Marland Kitchen atorvastatin (LIPITOR) 80 MG tablet Take 1 tablet (80 mg total) by mouth daily. 90 tablet 1  . budesonide (PULMICORT) 0.5 MG/2ML nebulizer solution Take 2 mLs (0.5 mg total) by nebulization 2 (two) times daily. 120 mL 3  . ferrous sulfate 325 (65 FE) MG EC tablet Take 325 mg daily with breakfast by mouth.    Marland Kitchen ipratropium-albuterol (DUONEB) 0.5-2.5 (  3) MG/3ML SOLN Take 3 mLs by nebulization every 6 (six) hours as needed. 360 mL 0  . levothyroxine (SYNTHROID, LEVOTHROID) 125 MCG tablet Take 1 tablet (125 mcg total) daily before breakfast by mouth. 30 tablet 1  . metoprolol succinate (TOPROL-XL) 50 MG 24 hr tablet TAKE ONE TABLET BY MOUTH TWICE DAILY 180 tablet 3  . Multiple Vitamins-Minerals (ICAPS AREDS FORMULA PO) Take 1 tablet 2 (two) times daily by mouth.    Marland Kitchen omeprazole (PRILOSEC) 20 MG capsule Take 30- 60 min before your first and last meals of the day 60  capsule 2  . predniSONE (DELTASONE) 10 MG tablet Take 1 tablet (10 mg total) daily with breakfast by mouth. 20 tablet 0  . Vitamin D, Cholecalciferol, 1000 UNITS CAPS Take 1,000 Units by mouth daily.      No current facility-administered medications for this visit.     SURGICAL HISTORY:  Past Surgical History:  Procedure Laterality Date  . CHOLECYSTECTOMY    . CORONARY ANGIOPLASTY WITH STENT PLACEMENT  2011   [place] 30-40%, LAD 65-75% (FFR 0.88), CFX 55-60%, RCA 95%>0 w/ 2.5 x 12 mm monorail stent  . IR FLUORO GUIDE PORT INSERTION RIGHT  04/07/2017  . IR US GUIDE VASC ACCESS RIGHT  04/07/2017  . PERICARDIOCENTESIS N/A 08/07/2017   Procedure: PERICARDIOCENTESIS;  Surgeon: Belva Crome, MD;  Location: [facility];  Service: Cardiovascular;  Laterality: N/A;  . TUBAL LIGATION    . VIDEO BRONCHOSCOPY Bilateral 06/07/2016   Procedure: VIDEO BRONCHOSCOPY WITH FLUORO;  Surgeon: [name], MD;  Location: [facility];  Service: Cardiopulmonary;  Laterality: Bilateral;    REVIEW OF SYSTEMS:   Review of Systems  Constitutional: Negative for appetite change, chills, fever and unexpected weight change. Positive for fatigue. HENT:   Negative for mouth sores, nosebleeds, sore throat and trouble swallowing.   Eyes: Negative for eye problems and icterus.  Respiratory: Negative for hemoptysis and wheezing.  Positive for cough and shortness of breath. Cardiovascular: Negative for chest pain and leg swelling.  Gastrointestinal: Negative for abdominal pain, constipation, diarrhea, nausea and vomiting.  Genitourinary: Negative for bladder incontinence, difficulty urinating, dysuria, frequency and hematuria.   Musculoskeletal: Negative for back pain, gait problem, neck pain and neck stiffness.  Skin: Negative for itching and rash.  Neurological: Negative for dizziness, extremity weakness, gait problem, headaches, light-headedness and seizures.  Hematological: Negative for adenopathy. Does not  bruise/bleed easily.  Psychiatric/Behavioral: Negative for confusion, depression and sleep disturbance. The patient is not nervous/anxious.     PHYSICAL EXAMINATION:  Blood pressure 120/60, pulse (!) 58, temperature 98.1 F (36.7 C), temperature source Oral, resp. rate 18, height 5\' 4"  (1.626 m), weight 143 lb 3.2 oz (65 kg), SpO2 100 %.  ECOG PERFORMANCE STATUS: 1 - Symptomatic but completely ambulatory  Physical Exam  Constitutional: Oriented to person, place, and time and well-developed, well-nourished, and in no distress. No distress.  HENT:  Head: Normocephalic and atraumatic.  Mouth/Throat: Oropharynx is clear and moist. No oropharyngeal exudate.  Eyes: Conjunctivae are normal. Right eye exhibits no discharge. Left eye exhibits no discharge. No scleral icterus.  Neck: Normal range of motion. Neck supple.  Cardiovascular: Normal rate, regular rhythm, normal heart sounds and intact distal pulses.   Pulmonary/Chest: Effort normal and breath sounds normal. No respiratory distress. No wheezes. No rales.  Abdominal: Soft. Bowel sounds are normal. Exhibits no distension and no mass. There is no tenderness.  Musculoskeletal: Normal range of motion. Exhibits no edema.  Lymphadenopathy:  No cervical adenopathy.  Neurological: Alert and oriented to person, place, and time. Exhibits normal muscle tone. Gait normal. Coordination normal.  Skin: Skin is warm and dry. No rash noted. Not diaphoretic. No erythema. No pallor.  Psychiatric: Mood, memory and judgment normal.  Vitals reviewed.  LABORATORY DATA: Lab Results  Component Value Date   WBC 9.3 08/22/2017   HGB 9.1 (L) 08/22/2017   HCT 28.3 (L) 08/22/2017   MCV 99.2 08/22/2017   PLT 192 08/22/2017      Chemistry      Component Value Date/Time   NA 139 08/22/2017 1054   K 5.0 08/22/2017 1054   CL 95 (L) 08/16/2017 1511   CO2 32 (H) 08/22/2017 1054   BUN 39.4 (H) 08/22/2017 1054   CREATININE 1.5 (H) 08/22/2017 1054       Component Value Date/Time   CALCIUM 9.6 08/22/2017 1054   ALKPHOS 69 08/22/2017 1054   AST 28 08/22/2017 1054   ALT 29 08/22/2017 1054   BILITOT 0.63 08/22/2017 1054       RADIOGRAPHIC STUDIES:  Dg Chest 2 View  Result Date: 07/31/2017 CLINICAL DATA:  Lung cancer.  Chemotherapy and radiation. EXAM: CHEST  2 VIEW COMPARISON:  Two-view chest x-ray a 06/20/2017 and 05/18/2017 FINDINGS: Heart is enlarged. Bilateral fibrotic changes are again noted. A right IJ Port-A-Cath is in place. Left-sided airspace opacity is again seen. Chronic left pleural effusion is stable. IMPRESSION: 1. No acute cardiopulmonary disease or significant interval change. 2. Stable cardiomegaly without failure. 3. Similar appearance of pulmonary fibrosis. 4. Stable areas of airspace opacity in the left lung compatible with treated tumor. 5. Chronic left pleural effusion. Electronically Signed   By: San Morelle M.D.   On: 07/31/2017 15:15   Ct Chest W Contrast  Result Date: 08/22/2017 CLINICAL DATA:  Patient with history of stage IV squamous cell carcinoma of the left lung. Shortness of breath and weight loss. EXAM: CT CHEST, ABDOMEN, AND PELVIS WITH CONTRAST TECHNIQUE: Multidetector CT imaging of the chest, abdomen and pelvis was performed following the standard protocol during bolus administration of intravenous contrast. CONTRAST:  20mL ISOVUE-300 IOPAMIDOL (ISOVUE-300) INJECTION 61% COMPARISON:  CT chest 03/03/2017 ; CT CAP 05/18/2017 FINDINGS: CT CHEST FINDINGS Cardiovascular: Right anterior chest wall Port-A-Cath is present with tip terminating in the superior vena cava. Interval development of a moderate pericardial effusion. Coronary arterial vascular calcifications. Thoracic aortic vascular calcifications. Mediastinum/Nodes: Unchanged 11 mm pretracheal lymph node (image 21; series 2). Interval increase in size of prevascular lymph node measuring 9 mm (image 23; series 2), previously 5 mm. Interval increase in  size of precarinal lymph node measuring 9 mm (image 24; series 2), previously 6 mm. Esophagus is normal in appearance. No axillary adenopathy. Lungs/Pleura: Central airways are patent. Re- demonstrated bilateral subpleural reticular opacities with associated architectural distortion. Interval increase in size of moderate left pleural effusion and associated pleural thickening/ irregularity. Re- demonstrated masslike consolidative opacity within the left mid lung likely secondary to post radiation changes. No pneumothorax. Musculoskeletal: Thoracic spine degenerative changes. No aggressive or acute appearing osseous lesions. CT ABDOMEN PELVIS FINDINGS Hepatobiliary: Liver is normal in size and contour. No focal hepatic lesion is identified. Gallbladder surgically absent. Mild intrahepatic biliary ductal dilatation. Common bile duct is normal in diameter. Pancreas: Unremarkable Spleen: Unremarkable Adrenals/Urinary Tract: Interval decrease in size of the left adrenal mass measuring 0.9 x 0.7 cm (image 64; series 2), previously 1.3 x 2.5 cm. Right adrenal gland is normal. There is a 2.2 x  2.0 cm mass within the interpolar region of the right kidney (image 69; series 2) with suggestion of heterogeneous enhancement. Cyst interpolar region left kidney. Urinary bladder is decompressed. Stomach/Bowel: Mild wall thickening of the distal colon. No free fluid or free intraperitoneal air. Normal morphology of the stomach. No evidence for bowel obstruction. Vascular/Lymphatic: Normal caliber abdominal aorta. Peripheral calcified atherosclerotic plaque. No retroperitoneal lymphadenopathy. Reproductive: Uterus and adnexal structures are unremarkable. Other: None. Musculoskeletal: Lumbar spine degenerative changes. No aggressive or acute appearing osseous lesions. IMPRESSION: 1. Interval decrease in size of mass within the lateral limb left adrenal gland. 2. Interval increase in size of mediastinal adenopathy. 3. Interval increase  in size of moderate pericardial effusion. 4. Interval increase in size of moderate left pleural effusion. 5. Similar-appearing pulmonary fibrosis and regions of airspace opacity in the left lung. 6. Within the medial interpolar region of the right kidney there is suggestion of and enhancing renal mass. Recommend dedicated evaluation with pre and post contrast-enhanced CT or MRI however overall findings are concerning for the possibility of a renal cell carcinoma. Electronically Signed   By: Lovey Newcomer M.D.   On: 08/22/2017 16:55   Ct Abdomen Pelvis W Contrast  Result Date: 08/22/2017 CLINICAL DATA:  Patient with history of stage IV squamous cell carcinoma of the left lung. Shortness of breath and weight loss. EXAM: CT CHEST, ABDOMEN, AND PELVIS WITH CONTRAST TECHNIQUE: Multidetector CT imaging of the chest, abdomen and pelvis was performed following the standard protocol during bolus administration of intravenous contrast. CONTRAST:  71mL ISOVUE-300 IOPAMIDOL (ISOVUE-300) INJECTION 61% COMPARISON:  CT chest 03/03/2017 ; CT CAP 05/18/2017 FINDINGS: CT CHEST FINDINGS Cardiovascular: Right anterior chest wall Port-A-Cath is present with tip terminating in the superior vena cava. Interval development of a moderate pericardial effusion. Coronary arterial vascular calcifications. Thoracic aortic vascular calcifications. Mediastinum/Nodes: Unchanged 11 mm pretracheal lymph node (image 21; series 2). Interval increase in size of prevascular lymph node measuring 9 mm (image 23; series 2), previously 5 mm. Interval increase in size of precarinal lymph node measuring 9 mm (image 24; series 2), previously 6 mm. Esophagus is normal in appearance. No axillary adenopathy. Lungs/Pleura: Central airways are patent. Re- demonstrated bilateral subpleural reticular opacities with associated architectural distortion. Interval increase in size of moderate left pleural effusion and associated pleural thickening/ irregularity. Re-  demonstrated masslike consolidative opacity within the left mid lung likely secondary to post radiation changes. No pneumothorax. Musculoskeletal: Thoracic spine degenerative changes. No aggressive or acute appearing osseous lesions. CT ABDOMEN PELVIS FINDINGS Hepatobiliary: Liver is normal in size and contour. No focal hepatic lesion is identified. Gallbladder surgically absent. Mild intrahepatic biliary ductal dilatation. Common bile duct is normal in diameter. Pancreas: Unremarkable Spleen: Unremarkable Adrenals/Urinary Tract: Interval decrease in size of the left adrenal mass measuring 0.9 x 0.7 cm (image 64; series 2), previously 1.3 x 2.5 cm. Right adrenal gland is normal. There is a 2.2 x 2.0 cm mass within the interpolar region of the right kidney (image 69; series 2) with suggestion of heterogeneous enhancement. Cyst interpolar region left kidney. Urinary bladder is decompressed. Stomach/Bowel: Mild wall thickening of the distal colon. No free fluid or free intraperitoneal air. Normal morphology of the stomach. No evidence for bowel obstruction. Vascular/Lymphatic: Normal caliber abdominal aorta. Peripheral calcified atherosclerotic plaque. No retroperitoneal lymphadenopathy. Reproductive: Uterus and adnexal structures are unremarkable. Other: None. Musculoskeletal: Lumbar spine degenerative changes. No aggressive or acute appearing osseous lesions. IMPRESSION: 1. Interval decrease in size of mass within the lateral limb  left adrenal gland. 2. Interval increase in size of mediastinal adenopathy. 3. Interval increase in size of moderate pericardial effusion. 4. Interval increase in size of moderate left pleural effusion. 5. Similar-appearing pulmonary fibrosis and regions of airspace opacity in the left lung. 6. Within the medial interpolar region of the right kidney there is suggestion of and enhancing renal mass. Recommend dedicated evaluation with pre and post contrast-enhanced CT or MRI however overall  findings are concerning for the possibility of a renal cell carcinoma. Electronically Signed   By: Lovey Newcomer M.D.   On: 08/22/2017 16:55   Dg Chest Port 1 View  Result Date: 08/09/2017 CLINICAL DATA:  Shortness of Breath EXAM: PORTABLE CHEST 1 VIEW COMPARISON:  August 07, 2017 and June 09, 2017; chest CT May 18, 2017 FINDINGS: There is underlying pulmonary fibrosis. There is a Port-A-Cath with tip in superior vena cava. No pneumothorax. Previously noted chest tube on the left is been removed. There is loculated pleural effusion on the right with areas of patchy airspace consolidation in the left perihilar and left base regions, stable. No new opacity evident. Heart is mildly enlarged. The pulmonary vascularity is within normal limits. There is aortic atherosclerosis. No evident adenopathy. No bone lesions. IMPRESSION: Underlying pulmonary fibrosis. Perihilar airspace consolidation on the left as well as patchy infiltrate in the left base remain. There are areas of loculated pleural effusion on the left, stable. Stable cardiac prominence. Note that on prior CT, a pericardial effusion contributing to the cardiac enlargement. Aortic atherosclerosis present. No adenopathy demonstrable by radiography. Aortic Atherosclerosis (ICD10-I70.0). Electronically Signed   By: Lowella Grip III M.D.   On: 08/09/2017 10:40   Dg Chest Port 1 View  Result Date: 08/07/2017 CLINICAL DATA:  Left-sided pneumothorax follow-up study EXAM: PORTABLE CHEST 1 VIEW COMPARISON:  Chest x-ray of July 31, 2017. FINDINGS: The right lung is well-expanded. The interstitial markings are mildly prominent though stable. On the left there is persistent volume loss. Pleural fluid or pleural thickening is present along the lateral aspect of the left upper lobe. The small caliber chest tube tip projects over the posteromedial aspect of the left seventh rib. There is calcification in the wall of the aortic arch. The power port catheter  tip projects over the midportion of the SVC. The cardiac silhouette is mildly enlarged but stable. The left heart border remains obscured. IMPRESSION: No evidence of a pneumothorax. Stable volume loss on the left which may reflect pleural thickening or bite or loculated pleural fluid. New small caliber left-sided chest tube. Cardiomegaly without pulmonary vascular congestion. Electronically Signed   By: David  Martinique M.D.   On: 08/07/2017 17:04     ASSESSMENT/PLAN:  Stage IV squamous cell carcinoma of left lung ([place]) This is a very pleasant 76 year old white female with metastatic non-small cell lung cancer that was initially diagnosed as unresectable a stage IIB squamous cell carcinoma status post course of concurrent chemoradiation with weekly carboplatin and paclitaxel. She was unable to receive consolidation chemotherapy at that time because of poor performance status and complete location from the previous treatment with radiation. The patient had evidence for metastatic disease based on biopsy of the left adrenal gland with PDL 1 expression of 0% The patient was started on treatment with systemic chemotherapy with reduced dose carboplatin and gemcitabine status post 3 cycles. She tolerated the third cycle, but  had significant pancytopenia especially anemia and thrombocytopenia with her treatment.  Her treatment was discontinued and she received radiation to  her enlarging left adrenal mass.  Patient was seen with Dr. Julien Nordmann. CT scan results were discussed and the images were shown to the patient and her husband.  The patient has a moderate pericardial effusion and a moderate left pleural effusion which is likely affecting her breathing.  The patient has follow-up with cardiology with an echocardiogram within 1 week and she will keep these appointments.  Before the pleural effusion, referral has been made to interventional radiology for consideration of a thoracentesis.  Discussed with the patient  that there is a mass in the right kidney that needs further evaluation.  We will get an MRI within 1 month to further characterize this.  Explained to her that there is a slight increase in some lymph nodes in the chest, but this could be due to cancer versus her pericardial/pleural effusions.  We will continue to watch this.  The patient will return in 1 month with an MRI prior to that visit.  She will follow-up with cardiology in the interim.  All questions were answered. The patient knows to call the clinic with any problems, questions or concerns. We can certainly see the patient much sooner if necessary.   Orders Placed This Encounter  Procedures  . US THORACENTESIS ASP PLEURAL SPACE W/IMG GUIDE    Send fluid for cytology    Standing Status:   Future    Standing Expiration Date:   10/23/2018    Order Specific Question:   Reason for exam:    Answer:   Left pleural effusion. Increased shortness of breath.    Order Specific Question:   Preferred imaging location?    Answer:   [facility]  . MR Abdomen W Wo Contrast    Standing Status:   Future    Standing Expiration Date:   10/23/2018    Order Specific Question:   If indicated for the ordered procedure, I authorize the administration of contrast media per Radiology protocol    Answer:   Yes    Order Specific Question:   What is the patient's sedation requirement?    Answer:   No Sedation    Order Specific Question:   Does the patient have a pacemaker or implanted devices?    Answer:   No    Order Specific Question:   Radiology Contrast Protocol - do NOT remove file path    Answer:   [email or web address]    Order Specific Question:   Reason for Exam additional comments    Answer:   F/U right kidney mass.    Order Specific Question:   Preferred imaging location?    Answer:   [facility] (table limit-350 lbs)  . CBC with Differential/Platelet    Standing Status:   Future    Standing Expiration  Date:   08/23/2018  . Comprehensive metabolic panel    Standing Status:   Future    Standing Expiration Date:   08/23/2018    Mikey Bussing, DNP, AGPCNP-BC, AOCNP 08/24/17  ADDENDUM: Hematology/Oncology Attending: I had a face-to-face encounter with the patient.  I recommended her care plan.  This is a very pleasant 76 years old white female with metastatic non-small cell lung cancer, squamous cell carcinoma that was initially diagnosed as unresectable stage IIb status post concurrent chemoradiation.  She had evidence for disease progression and she was treated with systemic chemotherapy with carboplatin and gemcitabine discontinued secondary to intolerance.  The patient has been in observation for the last few months.  She had  repeat CT scan of the chest, abdomen and pelvis performed recently that showed enlarging pericardial effusion as well as left pleural effusion.  The patient underwent pericardiocentesis recently by her cardiologist. I recommended for her to see Dr. Tamala Julian again for consideration of another pericardiocentesis or pericardial window.  We will also arrange for the patient to have ultrasound-guided left thoracentesis. For the questionable right kidney mass, we will consider the patient for MRI of the abdomen to evaluate this lesion. Otherwise her scans showed mild increase in small mediastinal lymph nodes. We will bring the patient back for follow-up visit in 1 month for reevaluation after the MRI of the abdomen and hopefully management of the pericardial effusion by her cardiologist. She does not do very well with chemotherapy and I may consider the patient for treatment with immunotherapy if needed in the future. The patient was advised to call immediately if she has any other concerning symptoms in the interval. Disclaimer: This note was dictated with voice recognition software. Similar sounding words can inadvertently be transcribed and may be missed upon review. [name], MD 08/25/17

## 2017-08-24 NOTE — Assessment & Plan Note (Signed)
This is a very pleasant 76 year old white female with metastatic non-small cell lung cancer that was initially diagnosed as unresectable a stage IIB squamous cell carcinoma status post course of concurrent chemoradiation with weekly carboplatin and paclitaxel. She was unable to receive consolidation chemotherapy at that time because of poor performance status and complete location from the previous treatment with radiation. The patient had evidence for metastatic disease based on biopsy of the left adrenal gland with PDL 1 expression of 0% The patient was started on treatment with systemic chemotherapy with reduced dose carboplatin and gemcitabine status post 3 cycles. She tolerated the third cycle, but  had significant pancytopenia especially anemia and thrombocytopenia with her treatment.  Her treatment was discontinued and she received radiation to her enlarging left adrenal mass.  Patient was seen with Dr. Julien Nordmann. CT scan results were discussed and the images were shown to the patient and her husband.  The patient has a moderate pericardial effusion and a moderate left pleural effusion which is likely affecting her breathing.  The patient has follow-up with cardiology with an echocardiogram within 1 week and she will keep these appointments.  Before the pleural effusion, referral has been made to interventional radiology for consideration of a thoracentesis.  Discussed with the patient that there is a mass in the right kidney that needs further evaluation.  We will get an MRI within 1 month to further characterize this.  Explained to her that there is a slight increase in some lymph nodes in the chest, but this could be due to cancer versus her pericardial/pleural effusions.  We will continue to watch this.  The patient will return in 1 month with an MRI prior to that visit.  She will follow-up with cardiology in the interim.  All questions were answered. The patient knows to call the clinic with any  problems, questions or concerns. We can certainly see the patient much sooner if necessary.

## 2017-08-24 NOTE — Telephone Encounter (Signed)
-----   Message from Belva Crome, MD sent at 08/24/2017  4:12 PM EST ----- Regarding: Pericardial effusion Start colchicine .6 mg BID for 1 week then one daily. This may keep fluid around heart from building up.  HS

## 2017-08-24 NOTE — Telephone Encounter (Signed)
Called and made patient aware of Dr. Thompson Caul recommendations to start colchicine 0.6 mg BID x 1 week and then decrease to QD after that. Patient will keep appointment with HS on 11/20. Patient verbalized understanding and thanked me for the call. Rx sent to patient's preferred pharmacy.

## 2017-08-25 ENCOUNTER — Ambulatory Visit (HOSPITAL_COMMUNITY)
Admission: RE | Admit: 2017-08-25 | Discharge: 2017-08-25 | Disposition: A | Discharge status: Home / Self Care | Payer: PPO | Source: Ambulatory Visit | Attending: Oncology | Admitting: Oncology

## 2017-08-25 ENCOUNTER — Ambulatory Visit (HOSPITAL_COMMUNITY)
Admission: RE | Admit: 2017-08-25 | Discharge: 2017-08-25 | Disposition: A | Discharge status: Home / Self Care | Payer: PPO | Source: Ambulatory Visit | Attending: General Surgery | Admitting: General Surgery

## 2017-08-25 ENCOUNTER — Ambulatory Visit: Admit: 2017-08-25 | Payer: PPO | Admitting: Ophthalmology

## 2017-08-25 DIAGNOSIS — J90 Pleural effusion, not elsewhere classified: Secondary | ICD-10-CM | POA: Insufficient documentation

## 2017-08-25 DIAGNOSIS — Z9889 Other specified postprocedural states: Secondary | ICD-10-CM

## 2017-08-25 DIAGNOSIS — R846 Abnormal cytological findings in specimens from respiratory organs and thorax: Secondary | ICD-10-CM | POA: Diagnosis not present

## 2017-08-25 SURGERY — PHACOEMULSIFICATION, CATARACT, WITH IOL INSERTION
Anesthesia: Monitor Anesthesia Care | Laterality: Right

## 2017-08-25 MED ORDER — LIDOCAINE HCL 2 % IJ SOLN
INTRAMUSCULAR | Status: AC
  Filled 2017-08-25: qty 10

## 2017-08-25 NOTE — Procedures (Signed)
Ultrasound-guided diagnostic and therapeutic left thoracentesis performed yielding 0.9 liters of serous colored fluid. No immediate complications. Follow-up chest x-ray pending.      Henreitta Cea 10:47 AM 08/25/2017

## 2017-08-28 ENCOUNTER — Other Ambulatory Visit: Payer: Self-pay | Admitting: *Deleted

## 2017-08-28 DIAGNOSIS — I3139 Other pericardial effusion (noninflammatory): Secondary | ICD-10-CM

## 2017-08-28 DIAGNOSIS — I313 Pericardial effusion (noninflammatory): Secondary | ICD-10-CM

## 2017-08-28 NOTE — Progress Notes (Deleted)
Cardiology Office Note    Date:  08/28/2017   ID:  Renee Vance, DOB 02-16-1941, MRN [id number]  PCP:  [name], MD  Cardiologist: Sinclair Grooms, MD   No chief complaint on file.   History of Present Illness:  Renee Vance is a 76 y.o. female with cardiac history of paroxysmal atrial fibrillation, coronary artery disease with drug-eluting stent in the distal RCA and intermediate stenosis in the LAD had cath in 2011, COPD, hyperlipidemia, obesity, and relatively recent diagnosis of squamous cell carcinoma of the left upper lobe. She is status post radiochemotherapy with good response. She is now being considered for left upper lobectomy. FEV1 is 70%.      Past Medical History:  Diagnosis Date  . 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 Bradycardia   . CAD (coronary artery disease)   . Cellulitis of right forearm 05/10/2017  . COPD (chronic obstructive pulmonary disease) ([name])   . Coronary atherosclerosis of native coronary artery 2011  . Encounter for antineoplastic chemotherapy 06/23/2016  . Fibrocystic breast   . History of radiation therapy 07/05/16 - 08/09/16    Left Lung: 45 Gy in 25 fractions  . History of radiation therapy 06/14/2017-06/23/2017   left kidney was treated to 40 Gy in 5 fractions  . HTN (hypertension)   . Hyperlipidemia   . Hypothyroidism   . Neutropenic fever ([place]) 03/15/2017  . Obesity   . Pain in joint   . Right renal mass 11/30/2016  . Stage II squamous cell carcinoma of left lung ([place]) 06/23/2016  . Thrombocytopenia ([facility]) 03/15/2017    Past Surgical History:  Procedure Laterality Date  . CHOLECYSTECTOMY    . CORONARY ANGIOPLASTY WITH STENT PLACEMENT  2011   [place] 30-40%, LAD 65-75% (FFR 0.88), CFX 55-60%, RCA 95%>0 w/ 2.5 x 12 mm monorail stent  . IR FLUORO GUIDE PORT INSERTION RIGHT  04/07/2017  . IR US GUIDE VASC ACCESS RIGHT  04/07/2017  . PERICARDIOCENTESIS N/A 08/07/2017   Performed by Belva Crome, MD at  [facility]  . TUBAL LIGATION    . VIDEO BRONCHOSCOPY WITH FLUORO Bilateral 06/07/2016   Performed by [name], MD at [facility] ENDOSCOPY    Current Medications: Outpatient Medications Prior to Visit  Medication Sig Dispense Refill  . acetaminophen (TYLENOL) 325 MG tablet Take 650 mg by mouth every 6 (six) hours as needed.    Marland Kitchen amiodarone (PACERONE) 200 MG tablet Take 400 mg (2 tables) twice daily for 2 weeks, then 200 mg  (1 tablet) daily thereafter. 70 tablet 0  . amLODipine (NORVASC) 5 MG tablet Take 5 mg by mouth daily.    Marland Kitchen aspirin 81 MG tablet Take 81 mg by mouth daily.    Marland Kitchen atorvastatin (LIPITOR) 80 MG tablet Take 1 tablet (80 mg total) by mouth daily. 90 tablet 1  . budesonide (PULMICORT) 0.5 MG/2ML nebulizer solution Take 2 mLs (0.5 mg total) by nebulization 2 (two) times daily. 120 mL 3  . colchicine 0.6 MG tablet Take 0.6 mg (1 tablet) TWICE a day for 1 WEEK and then decrease to 0.6 mg (1 tablet) ONCE a day 37 tablet 0  . ferrous sulfate 325 (65 FE) MG EC tablet Take 325 mg daily with breakfast by mouth.    Marland Kitchen ipratropium-albuterol (DUONEB) 0.5-2.5 (3) MG/3ML SOLN Take 3 mLs by nebulization every 6 (six) hours as needed. 360 mL 0  . levothyroxine (SYNTHROID, LEVOTHROID) 125 MCG tablet Take 1  tablet (125 mcg total) daily before breakfast by mouth. 30 tablet 1  . metoprolol succinate (TOPROL-XL) 50 MG 24 hr tablet TAKE ONE TABLET BY MOUTH TWICE DAILY 180 tablet 3  . Multiple Vitamins-Minerals (ICAPS AREDS FORMULA PO) Take 1 tablet 2 (two) times daily by mouth.    Marland Kitchen omeprazole (PRILOSEC) 20 MG capsule Take 30- 60 min before your first and last meals of the day 60 capsule 2  . predniSONE (DELTASONE) 10 MG tablet Take 1 tablet (10 mg total) daily with breakfast by mouth. 20 tablet 0  . Vitamin D, Cholecalciferol, 1000 UNITS CAPS Take 1,000 Units by mouth daily.      No facility-administered medications prior to visit.      Allergies:   Ampicillin and Codeine   Social History     Socioeconomic History  . Marital status: Married    Spouse name: Not on file  . Number of children: Not on file  . Years of education: Not on file  . Highest education level: Not on file  Social Needs  . Financial resource strain: Not on file  . Food insecurity - worry: Not on file  . Food insecurity - inability: Not on file  . Transportation needs - medical: Not on file  . Transportation needs - non-medical: Not on file  Occupational History  . Occupation: retired  Tobacco Use  . Smoking status: Former Smoker    Packs/day: 0.50    Years: 60.00    Pack years: 30.00    Last attempt to quit: 04/10/2014    Years since quitting: 3.3  . Smokeless tobacco: Never Used  . Tobacco comment: june 2015  Substance and Sexual Activity  . Alcohol use: No    Alcohol/week: 0.0 oz  . Drug use: No  . Sexual activity: Not Currently  Other Topics Concern  . Not on file  Social History Narrative  . Not on file     Family History:  The patient's ***family history includes Heart disease in her mother.   ROS:   Please see the history of present illness.    ***  All other systems reviewed and are negative.   PHYSICAL EXAM:   VS:  There were no vitals taken for this visit.   GEN: Well nourished, well developed, in no acute distress  HEENT: normal  Neck: no JVD, carotid bruits, or masses Cardiac: ***RRR; no murmurs, rubs, or gallops,no edema  Respiratory:  clear to auscultation bilaterally, normal work of breathing GI: soft, nontender, nondistended, + BS MS: no deformity or atrophy  Skin: warm and dry, no rash Neuro:  Alert and Oriented x 3, Strength and sensation are intact Psych: euthymic mood, full affect  Wt Readings from Last 3 Encounters:  08/23/17 143 lb 3.2 oz (65 kg)  08/16/17 136 lb 1.9 oz (61.7 kg)  08/07/17 137 lb 9.1 oz (62.4 kg)      Studies/Labs Reviewed:   EKG:  EKG  ***  Recent Labs: 03/21/2017: Magnesium 1.6 08/09/2017: B Natriuretic Peptide  1,025.1 08/16/2017: TSH 0.463 08/22/2017: ALT 29; BUN 39.4; Creatinine 1.5; HGB 9.1; Platelets 192; Potassium 5.0; Sodium 139   Lipid Panel No results found for: CHOL, TRIG, HDL, CHOLHDL, VLDL, LDLCALC, LDLDIRECT  Additional studies/ records that were reviewed today include:  ***    ASSESSMENT:    1. Pericardial effusion   2. Atypical atrial flutter (HCC)   3. Coronary artery disease involving native coronary artery of native heart with angina pectoris ([place])   4.  Essential hypertension   5. PAF (paroxysmal atrial fibrillation) (HCC)      PLAN:  In order of problems listed above:  1. ***    Medication Adjustments/Labs and Tests Ordered: Current medicines are reviewed at length with the patient today.  Concerns regarding medicines are outlined above.  Medication changes, Labs and Tests ordered today are listed in the Patient Instructions below. There are no Patient Instructions on file for this visit.   Signed, Sinclair Grooms, MD  08/28/2017 5:36 PM    [facility] [address], [place], [place]  [address] Phone: [phone]; Fax: [phone]

## 2017-08-29 ENCOUNTER — Telehealth: Payer: Self-pay | Admitting: Interventional Cardiology

## 2017-08-29 ENCOUNTER — Ambulatory Visit: Payer: PPO | Admitting: Interventional Cardiology

## 2017-08-29 NOTE — Telephone Encounter (Signed)
Spoke with husband, DPR on file.  He states pt has developed severe diarrhea since starting Colchicine.  Pt unable to come to appt today.  Rescheduled for next Tuesday, 11/27.  Advised I will speak with Dr. Tamala Julian and call back with recommendations.  Spoke with Dr. Tamala Julian and he said for pt to stop Colchicine and keep appt for next week.  Spoke with husband and made him aware.  Husband verbalized understanding and was appreciative for call.

## 2017-08-29 NOTE — Telephone Encounter (Signed)
Pt c/o medication issue:  1. Name of Medication: Colchicine 0.6 mg  2. How are you currently taking this medication (dosage and times per day)? 2x dailly for 7 and then 1x dailty after  3. Are you having a reaction (difficulty breathing--STAT)?no  4. What is your medication issue? Medication causing diarrhea

## 2017-09-04 ENCOUNTER — Other Ambulatory Visit: Payer: Self-pay

## 2017-09-04 ENCOUNTER — Encounter: Payer: Self-pay | Admitting: Thoracic Surgery (Cardiothoracic Vascular Surgery)

## 2017-09-04 ENCOUNTER — Ambulatory Visit: Payer: PPO | Admitting: Thoracic Surgery (Cardiothoracic Vascular Surgery)

## 2017-09-04 VITALS — BP 133/75 | HR 64 | Ht 64.0 in | Wt 144.0 lb

## 2017-09-04 DIAGNOSIS — I313 Pericardial effusion (noninflammatory): Secondary | ICD-10-CM | POA: Diagnosis not present

## 2017-09-04 DIAGNOSIS — J449 Chronic obstructive pulmonary disease, unspecified: Secondary | ICD-10-CM | POA: Diagnosis not present

## 2017-09-04 DIAGNOSIS — C3492 Malignant neoplasm of unspecified part of left bronchus or lung: Secondary | ICD-10-CM | POA: Diagnosis not present

## 2017-09-04 DIAGNOSIS — I3139 Other pericardial effusion (noninflammatory): Secondary | ICD-10-CM

## 2017-09-04 NOTE — H&P (View-Only) (Signed)
[address]       [place] [address]             [phone]     HPI: Mrs. Mullet is sent for consultation re: a pericardial effusion  Mrs. Navis is a 76 year old lung cancer was recently found to have a pericardial effusion.  Her past medical history is significant for coronary artery disease with previous stenting, paroxysmal atrial fibrillation, tobacco abuse, COPD, interstitial lung disease, hyperlipidemia, and hypertension.  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  She was treated with chemo and radiation.  She had to quit chemotherapy due to side effects.  She recently was treated with radiation for her left adrenal mass.  She was having problems with worsening shortness of breath.  And also was having problems with leg swelling.  A CT back in October showed a pericardial and left pleural effusion.  She was having a pericardiocentesis on 08/07/2017 when she became tachypneic and tachycardic she was in atrial flutter and was started on amiodarone.  She was admitted to the hospital and was discharged 5 days later.  900 mL of fluid was drained with pericardiocentesis.  On 08/25/2017 she had 900 mL of serous fluid drained from her left pleural space.  Cytology from both effusions was negative.  She currently is on home oxygen at 4 L nasal cannula.  She does get short of breath easily.  She is also having worsening swelling in her lower extremities, right greater than left.  She does not have orthopnea. Zubrod Score: At the time of surgery this patient's most appropriate activity status/level should be described as: []     0    Normal activity, no symptoms []     1    Restricted in physical strenuous activity but ambulatory, able to do out light work [x]     2    Ambulatory and capable of self care, unable to do work activities, up and about >50 % of waking hours                               []     3    Only limited self care, in bed greater than 50% of waking hours []     4    Completely disabled, no self care, confined to bed or chair []     5    Moribund   Current Outpatient Medications  Medication Sig Dispense Refill  . acetaminophen (TYLENOL) 325 MG tablet Take 650 mg by mouth every 6 (six) hours as needed.    Marland Kitchen amLODipine (NORVASC) 5 MG tablet Take 5 mg by mouth daily.    Marland Kitchen aspirin 81 MG tablet Take 81 mg by mouth daily.    Marland Kitchen atorvastatin (LIPITOR) 80 MG tablet Take 1 tablet (80 mg total) by mouth daily. 90 tablet 1  . budesonide (PULMICORT) 0.5 MG/2ML nebulizer solution Take 2 mLs (0.5 mg total) by nebulization 2 (two) times daily. 120 mL 3  . ferrous sulfate 325 (65 FE) MG EC tablet Take 325 mg daily with breakfast by mouth.    Marland Kitchen ipratropium-albuterol (DUONEB) 0.5-2.5 (3) MG/3ML SOLN Take 3 mLs by nebulization every 6 (six) hours as needed. 360 mL 0  . levothyroxine (SYNTHROID, LEVOTHROID) 125 MCG tablet Take 1 tablet (125 mcg total) daily before breakfast by  mouth. 30 tablet 1  . metoprolol succinate (TOPROL-XL) 50 MG 24 hr tablet TAKE ONE TABLET BY MOUTH TWICE DAILY 180 tablet 3  . Multiple Vitamins-Minerals (ICAPS AREDS FORMULA PO) Take 1 tablet 2 (two) times daily by mouth.    Marland Kitchen omeprazole (PRILOSEC) 20 MG capsule Take 30- 60 min before your first and last meals of the day 60 capsule 2  . predniSONE (DELTASONE) 10 MG tablet Take 1 tablet (10 mg total) daily with breakfast by mouth. 20 tablet 0  . Vitamin D, Cholecalciferol, 1000 UNITS CAPS Take 1,000 Units by mouth daily.     . feeding supplement (BOOST HIGH PROTEIN) LIQD Take by mouth.     No current facility-administered medications for this visit.     Physical Exam  Constitutional: She is oriented to person, place, and time. She appears well-developed and well-nourished. No distress.  HENT:  Head: Normocephalic and atraumatic.  Mouth/Throat: No oropharyngeal exudate.  Nasal cannula in  place  Eyes: Conjunctivae and EOM are normal. No scleral icterus.  Neck: JVD present. No thyromegaly present.  Cardiovascular: Normal rate, regular rhythm and normal heart sounds. Exam reveals no friction rub.  No murmur heard. Pulmonary/Chest: No respiratory distress. She has no wheezes.  Diminished breath sounds left base  Abdominal: Soft. She exhibits no distension. There is no tenderness.  Musculoskeletal: She exhibits edema (3+ on right, 2+ on left).  Lymphadenopathy:    She has no cervical adenopathy.  Neurological: She is alert and oriented to person, place, and time. No cranial nerve deficit. She exhibits normal muscle tone. Coordination normal.  Skin: Skin is warm.   BP 133/75 (BP Location: Left Arm, Patient Position: Sitting, Cuff Size: Normal)   Pulse 64   Ht 5\' 4"  (1.626 m)   Wt 144 lb (65.3 kg)   SpO2 99% Comment: 4L O2  BMI 24.72 kg/m   Diagnostic Tests: Echocardiogram 08/24/2017 Study Conclusions  - Left ventricle: Wall thickness was increased in a pattern of   moderate LVH. Systolic function was normal. The estimated   ejection fraction was in the range of 55% to 60%. Doppler   parameters are consistent with high ventricular filling pressure. - Left atrium: The atrium was mildly dilated. - Pericardium, extracardiac: There was moderate thickening. A   moderate (1.6cm) pericardial effusion was identified   circumferential to the heart. The fluid exhibited a fibrinous   appearance. The appearance is consistent with an   effusive-constrictive pathology. There was moderateright atrial   chamber collapse. There was a left pleural effusion (6cm).  Impressions:  - Recurrent pericardial and pleural effusion with marked thickening   of the pericardium (prior radiation noted). Question the need for   pericardial stripping (pericardectomy) to avoid constrictive   physiology.  Impression: Mrs. Shetterly is a 76 year old woman with metastatic has a recurrent  pericardial effusion.  She was treated with pericardiocentesis about a month ago.  Cytology was negative.  She did not tolerate colchicine due to diarrhea.  She had an echocardiogram about 10 days ago which showed reaccumulation of the pericardial effusion.  There was no sign of cardiac tamponade.  She also had a left pleural effusion.  Cytology from that was negative as well.  The fluid was serous I suspect this all may just be reactive or inflammatory in nature.  She had a thoracentesis on that about a week ago.  She has not had any reaccumulation of the pericardial effusion to date.  I offered Mrs. Galentine the option of a  subxiphoid pericardial window.  We discussed the general nature of the procedure including the incision to be used, the use of a drainage tube postoperatively, the expected hospital stay, and the overall recovery.  She and her husband understand that this is not completely eliminate the possibility of a recurrent effusion but does make it less likely.  I reviewed the indications, risks, benefits, and alternatives.  She understands the risks include, but not limited to death, MI, DVT, PE, stroke, bleeding, possible need for transfusion, infection, cardiac injury, recurrent effusion, as well as possibility of other unforeseeable complications.  She is interested in proceeding but wants to speak with Dr. Elsworth Soho and Dr. Tamala Julian before making a final decision.  We have tentatively scheduled her for Monday, 09/09/2017.  She sees Dr. Elsworth Soho and Dr. Tamala Julian later this week.  Plan: Subxiphoid pericardial window  Melrose Nakayama, MD [facility] [phone]

## 2017-09-04 NOTE — Progress Notes (Signed)
[address]       [place] [address]             [phone]     HPI: Renee Vance is sent for consultation re: a pericardial effusion  Renee Vance is a 76 year old lung cancer was recently found to have a pericardial effusion.  Her past medical history is significant for coronary artery disease with previous stenting, paroxysmal atrial fibrillation, tobacco abuse, COPD, interstitial lung disease, hyperlipidemia, and hypertension.  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  She was treated with chemo and radiation.  She had to quit chemotherapy due to side effects.  She recently was treated with radiation for her left adrenal mass.  She was having problems with worsening shortness of breath.  And also was having problems with leg swelling.  A CT back in October showed a pericardial and left pleural effusion.  She was having a pericardiocentesis on 08/07/2017 when she became tachypneic and tachycardic she was in atrial flutter and was started on amiodarone.  She was admitted to the hospital and was discharged 5 days later.  900 mL of fluid was drained with pericardiocentesis.  On 08/25/2017 she had 900 mL of serous fluid drained from her left pleural space.  Cytology from both effusions was negative.  She currently is on home oxygen at 4 L nasal cannula.  She does get short of breath easily.  She is also having worsening swelling in her lower extremities, right greater than left.  She does not have orthopnea. Zubrod Score: At the time of surgery this patient's most appropriate activity status/level should be described as: []     0    Normal activity, no symptoms []     1    Restricted in physical strenuous activity but ambulatory, able to do out light work [x]     2    Ambulatory and capable of self care, unable to do work activities, up and about >50 % of waking hours                               []     3    Only limited self care, in bed greater than 50% of waking hours []     4    Completely disabled, no self care, confined to bed or chair []     5    Moribund   Current Outpatient Medications  Medication Sig Dispense Refill  . acetaminophen (TYLENOL) 325 MG tablet Take 650 mg by mouth every 6 (six) hours as needed.    Marland Kitchen amLODipine (NORVASC) 5 MG tablet Take 5 mg by mouth daily.    Marland Kitchen aspirin 81 MG tablet Take 81 mg by mouth daily.    Marland Kitchen atorvastatin (LIPITOR) 80 MG tablet Take 1 tablet (80 mg total) by mouth daily. 90 tablet 1  . budesonide (PULMICORT) 0.5 MG/2ML nebulizer solution Take 2 mLs (0.5 mg total) by nebulization 2 (two) times daily. 120 mL 3  . ferrous sulfate 325 (65 FE) MG EC tablet Take 325 mg daily with breakfast by mouth.    Marland Kitchen ipratropium-albuterol (DUONEB) 0.5-2.5 (3) MG/3ML SOLN Take 3 mLs by nebulization every 6 (six) hours as needed. 360 mL 0  . levothyroxine (SYNTHROID, LEVOTHROID) 125 MCG tablet Take 1 tablet (125 mcg total) daily before breakfast by  mouth. 30 tablet 1  . metoprolol succinate (TOPROL-XL) 50 MG 24 hr tablet TAKE ONE TABLET BY MOUTH TWICE DAILY 180 tablet 3  . Multiple Vitamins-Minerals (ICAPS AREDS FORMULA PO) Take 1 tablet 2 (two) times daily by mouth.    Marland Kitchen omeprazole (PRILOSEC) 20 MG capsule Take 30- 60 min before your first and last meals of the day 60 capsule 2  . predniSONE (DELTASONE) 10 MG tablet Take 1 tablet (10 mg total) daily with breakfast by mouth. 20 tablet 0  . Vitamin D, Cholecalciferol, 1000 UNITS CAPS Take 1,000 Units by mouth daily.     . feeding supplement (BOOST HIGH PROTEIN) LIQD Take by mouth.     No current facility-administered medications for this visit.     Physical Exam  Constitutional: She is oriented to person, place, and time. She appears well-developed and well-nourished. No distress.  HENT:  Head: Normocephalic and atraumatic.  Mouth/Throat: No oropharyngeal exudate.  Nasal cannula in  place  Eyes: Conjunctivae and EOM are normal. No scleral icterus.  Neck: JVD present. No thyromegaly present.  Cardiovascular: Normal rate, regular rhythm and normal heart sounds. Exam reveals no friction rub.  No murmur heard. Pulmonary/Chest: No respiratory distress. She has no wheezes.  Diminished breath sounds left base  Abdominal: Soft. She exhibits no distension. There is no tenderness.  Musculoskeletal: She exhibits edema (3+ on right, 2+ on left).  Lymphadenopathy:    She has no cervical adenopathy.  Neurological: She is alert and oriented to person, place, and time. No cranial nerve deficit. She exhibits normal muscle tone. Coordination normal.  Skin: Skin is warm.   BP 133/75 (BP Location: Left Arm, Patient Position: Sitting, Cuff Size: Normal)   Pulse 64   Ht 5\' 4"  (1.626 m)   Wt 144 lb (65.3 kg)   SpO2 99% Comment: 4L O2  BMI 24.72 kg/m   Diagnostic Tests: Echocardiogram 08/24/2017 Study Conclusions  - Left ventricle: Wall thickness was increased in a pattern of   moderate LVH. Systolic function was normal. The estimated   ejection fraction was in the range of 55% to 60%. Doppler   parameters are consistent with high ventricular filling pressure. - Left atrium: The atrium was mildly dilated. - Pericardium, extracardiac: There was moderate thickening. A   moderate (1.6cm) pericardial effusion was identified   circumferential to the heart. The fluid exhibited a fibrinous   appearance. The appearance is consistent with an   effusive-constrictive pathology. There was moderateright atrial   chamber collapse. There was a left pleural effusion (6cm).  Impressions:  - Recurrent pericardial and pleural effusion with marked thickening   of the pericardium (prior radiation noted). Question the need for   pericardial stripping (pericardectomy) to avoid constrictive   physiology.  Impression: Renee Vance is a 76 year old woman with metastatic has a recurrent  pericardial effusion.  She was treated with pericardiocentesis about a month ago.  Cytology was negative.  She did not tolerate colchicine due to diarrhea.  She had an echocardiogram about 10 days ago which showed reaccumulation of the pericardial effusion.  There was no sign of cardiac tamponade.  She also had a left pleural effusion.  Cytology from that was negative as well.  The fluid was serous I suspect this all may just be reactive or inflammatory in nature.  She had a thoracentesis on that about a week ago.  She has not had any reaccumulation of the pericardial effusion to date.  I offered Renee Vance the option of a  subxiphoid pericardial window.  We discussed the general nature of the procedure including the incision to be used, the use of a drainage tube postoperatively, the expected hospital stay, and the overall recovery.  She and her husband understand that this is not completely eliminate the possibility of a recurrent effusion but does make it less likely.  I reviewed the indications, risks, benefits, and alternatives.  She understands the risks include, but not limited to death, MI, DVT, PE, stroke, bleeding, possible need for transfusion, infection, cardiac injury, recurrent effusion, as well as possibility of other unforeseeable complications.  She is interested in proceeding but wants to speak with Dr. Elsworth Soho and Dr. Tamala Julian before making a final decision.  We have tentatively scheduled her for Monday, 10/08/2017.  She sees Dr. Elsworth Soho and Dr. Tamala Julian later this week.  Plan: Subxiphoid pericardial window  Melrose Nakayama, MD [facility] [phone]

## 2017-09-05 ENCOUNTER — Encounter: Payer: Self-pay | Admitting: Interventional Cardiology

## 2017-09-05 ENCOUNTER — Telehealth: Payer: Self-pay | Admitting: Pulmonary Disease

## 2017-09-05 ENCOUNTER — Ambulatory Visit (INDEPENDENT_AMBULATORY_CARE_PROVIDER_SITE_OTHER): Payer: PPO | Admitting: Interventional Cardiology

## 2017-09-05 VITALS — BP 136/60 | HR 64 | Ht 64.0 in | Wt 145.4 lb

## 2017-09-05 DIAGNOSIS — I3139 Other pericardial effusion (noninflammatory): Secondary | ICD-10-CM

## 2017-09-05 DIAGNOSIS — I313 Pericardial effusion (noninflammatory): Secondary | ICD-10-CM

## 2017-09-05 NOTE — Patient Instructions (Signed)
Medication Instructions:  Your physician recommends that you continue on your current medications as directed. Please refer to the Current Medication list given to you today.  Labwork: None  Testing/Procedures: None  Follow-Up: Your physician recommends that you schedule a follow-up appointment when needed.  Please feel free to contact the office with any cardiac issues.     Any Other Special Instructions Will Be Listed Below (If Applicable).     If you need a refill on your cardiac medications before your next appointment, please call your pharmacy.

## 2017-09-05 NOTE — Telephone Encounter (Signed)
Okay to reschedule until after procedure

## 2017-09-05 NOTE — Telephone Encounter (Signed)
Called and spoke to pt's spouse, Gwyndolyn Saxon. Gwyndolyn Saxon states pt is scheduled for Subxiphoid pericardial window on 09/24/2017. Pt is scheduled for OV with RA on 09/06/17. Gwyndolyn Saxon wanted to know if pt should reschedule until have procedure. Gwyndolyn Saxon states pt is not having any worsen symptoms.  RA please advise. Thanks.

## 2017-09-05 NOTE — Progress Notes (Signed)
   Cardiology Office Note    Date:  09/05/2017    At this appointment was scheduled by mistake.  There should be no charge for this encounter.  No note is being generated.

## 2017-09-05 NOTE — Telephone Encounter (Signed)
Spoke with patient's husband. Advised him that RA stated that it was ok to wait until after the procedure to reschedule. He stated that he will call back once the patient has been home for a few days. Appt for tomorrow has been cancelled.   Nothing else needed at time of call.

## 2017-09-06 ENCOUNTER — Encounter: Payer: Self-pay | Admitting: Interventional Cardiology

## 2017-09-06 ENCOUNTER — Ambulatory Visit: Payer: PPO | Admitting: Pulmonary Disease

## 2017-09-06 DIAGNOSIS — J449 Chronic obstructive pulmonary disease, unspecified: Secondary | ICD-10-CM | POA: Diagnosis not present

## 2017-09-06 DIAGNOSIS — C3492 Malignant neoplasm of unspecified part of left bronchus or lung: Secondary | ICD-10-CM | POA: Diagnosis not present

## 2017-09-07 ENCOUNTER — Encounter (HOSPITAL_COMMUNITY)
Admission: RE | Admit: 2017-09-07 | Discharge: 2017-09-07 | Disposition: A | Discharge status: Home / Self Care | Payer: PPO | Source: Ambulatory Visit | Attending: Thoracic Surgery (Cardiothoracic Vascular Surgery) | Admitting: Thoracic Surgery (Cardiothoracic Vascular Surgery)

## 2017-09-07 ENCOUNTER — Other Ambulatory Visit: Payer: Self-pay

## 2017-09-07 ENCOUNTER — Encounter (HOSPITAL_COMMUNITY): Payer: Self-pay

## 2017-09-07 DIAGNOSIS — I3139 Other pericardial effusion (noninflammatory): Secondary | ICD-10-CM

## 2017-09-07 DIAGNOSIS — I313 Pericardial effusion (noninflammatory): Secondary | ICD-10-CM | POA: Diagnosis not present

## 2017-09-07 DIAGNOSIS — Z01812 Encounter for preprocedural laboratory examination: Secondary | ICD-10-CM | POA: Insufficient documentation

## 2017-09-07 HISTORY — DX: Pneumonia, unspecified organism: J18.9

## 2017-09-07 HISTORY — DX: Unspecified osteoarthritis, unspecified site: M19.90

## 2017-09-07 HISTORY — DX: Anemia, unspecified: D64.9

## 2017-09-07 HISTORY — DX: Gastro-esophageal reflux disease without esophagitis: K21.9

## 2017-09-07 HISTORY — DX: Cardiac arrhythmia, unspecified: I49.9

## 2017-09-07 LAB — URINALYSIS, ROUTINE W REFLEX MICROSCOPIC
Bilirubin Urine: NEGATIVE
Glucose, UA: NEGATIVE mg/dL
Ketones, ur: NEGATIVE mg/dL
Leukocytes, UA: NEGATIVE
Nitrite: NEGATIVE
PH: 5 (ref 5.0–8.0)
Protein, ur: 100 mg/dL — AB
SPECIFIC GRAVITY, URINE: 1.016 (ref 1.005–1.030)

## 2017-09-07 LAB — CBC
HCT: 29.7 % — ABNORMAL LOW (ref 36.0–46.0)
HEMOGLOBIN: 9 g/dL — AB (ref 12.0–15.0)
MCH: 30.4 pg (ref 26.0–34.0)
MCHC: 30.3 g/dL (ref 30.0–36.0)
MCV: 100.3 fL — ABNORMAL HIGH (ref 78.0–100.0)
Platelets: 204 10*3/uL (ref 150–400)
RBC: 2.96 MIL/uL — AB (ref 3.87–5.11)
RDW: 17.1 % — ABNORMAL HIGH (ref 11.5–15.5)
WBC: 9.8 10*3/uL (ref 4.0–10.5)

## 2017-09-07 LAB — COMPREHENSIVE METABOLIC PANEL
ALK PHOS: 71 U/L (ref 38–126)
ALT: 33 U/L (ref 14–54)
ANION GAP: 7 (ref 5–15)
AST: 38 U/L (ref 15–41)
Albumin: 2.9 g/dL — ABNORMAL LOW (ref 3.5–5.0)
BUN: 33 mg/dL — ABNORMAL HIGH (ref 6–20)
CALCIUM: 9.3 mg/dL (ref 8.9–10.3)
CO2: 28 mmol/L (ref 22–32)
Chloride: 100 mmol/L — ABNORMAL LOW (ref 101–111)
Creatinine, Ser: 1.37 mg/dL — ABNORMAL HIGH (ref 0.44–1.00)
GFR calc Af Amer: 42 mL/min — ABNORMAL LOW (ref >60)
GFR calc non Af Amer: 36 mL/min — ABNORMAL LOW (ref >60)
Glucose, Bld: 119 mg/dL — ABNORMAL HIGH (ref 65–99)
POTASSIUM: 5 mmol/L (ref 3.5–5.1)
SODIUM: 135 mmol/L (ref 135–145)
Total Bilirubin: 0.2 mg/dL — ABNORMAL LOW (ref 0.3–1.2)
Total Protein: 6.7 g/dL (ref 6.5–8.1)

## 2017-09-07 LAB — TYPE AND SCREEN
ABO/RH(D): B NEG
ANTIBODY SCREEN: NEGATIVE
WEAK D: POSITIVE

## 2017-09-07 LAB — BLOOD GAS, ARTERIAL
Acid-Base Excess: 6.5 mmol/L — ABNORMAL HIGH (ref 0.0–2.0)
Bicarbonate: 30.6 mmol/L — ABNORMAL HIGH (ref 20.0–28.0)
DRAWN BY: 270591
O2 CONTENT: 4 L/min
O2 SAT: 99.5 %
PATIENT TEMPERATURE: 98.6
PO2 ART: 156 mmHg — AB (ref 83.0–108.0)
pCO2 arterial: 45 mmHg (ref 32.0–48.0)
pH, Arterial: 7.448 (ref 7.350–7.450)

## 2017-09-07 LAB — APTT: aPTT: 31 seconds (ref 24–36)

## 2017-09-07 LAB — SURGICAL PCR SCREEN
MRSA, PCR: NEGATIVE
STAPHYLOCOCCUS AUREUS: NEGATIVE

## 2017-09-07 LAB — PROTIME-INR
INR: 1.03
Prothrombin Time: 13.5 seconds (ref 11.4–15.2)

## 2017-09-07 NOTE — Progress Notes (Addendum)
PCP is Dr. Assunta Found Cardiologist is Dr Daneen Schick  Pulmonary is Dr Elsworth Soho Echo 08-24-17 Cath 03-19-10 and had fluid drawn off 08-07-17 Denies chest pain states she gets short of breath with activity she states this is her norm.  Denies fever Reports a cough at times states that this is normal too. She is on 4 liters of O2 at home Informed Huntley Dec, NP Pt states she feels like she normal does and has no questions for Anesthesia.  Reports she was not told if and when to stop taking aspirin message left with Larene Beach at Dr Hendrickson's office as to when and if she should stop aspirin. Instructed pt if she has not heard anything by later today to call Dr Hendrickson's office and ask again. Voices understanding.

## 2017-09-07 NOTE — Progress Notes (Signed)
Called Dr Hendrickson's office spoke with Manuela Schwartz informed her of labs and to have Dr Roxan Hockey review.

## 2017-09-07 NOTE — Pre-Procedure Instructions (Signed)
SHANIGUA GIBB  09/07/2017      [facility] [address], [place] [address] [place] [address] Phone: [phone] Fax: [place], [place] [address] [place] [address] Phone: [phone] Fax: [phone]   Your procedure is scheduled on Monday, December 3.  Report to [facility] Admitting at 9:15 AM                 Your surgery or procedure is scheduled for 11:15 AM    Call this number if you have problems the morning of surgery: [phone]- pre- op desk     For any other questions, please call [phone], Monday - Friday 8 AM - 4 PM.    Remember:  Do not eat food or drink liquids after midnight Sunday, December 2.  Take these medicines the morning of surgery with A SIP OF WATER : amLODipine (NORVASC)  atorvastatin (LIPITOR) metoprolol succinate (TOPROL-XL) levothyroxine (SYNTHROID, LEVOTHROID) omeprazole (PRILOSEC)        May take Tylenol if needed.   STOP taking Aspirin , Aspirin Products (Goody Powder, Excedrin Migraine), Ibuprofen (Advil), Naproxen (Aleve), Vitiamins and Herbal Products (ie Fish Oil)  Special instructions:   [facility]- Preparing For Surgery  Before surgery, you can play an important role. Because skin is not sterile, your skin needs to be as free of germs as possible. You can reduce the number of germs on your skin by washing with CHG (chlorahexidine gluconate) Soap before surgery.  CHG is an antiseptic cleaner which kills germs and bonds with the skin to continue killing germs even after washing.  Please do not use if you have an allergy to CHG or antibacterial soaps. If your skin becomes reddened/irritated stop using the CHG.  Do not shave (including legs and underarms) for at least 48 hours prior to first CHG shower. It is OK to shave your face.  Please follow these instructions carefully.   1. Shower the NIGHT  BEFORE SURGERY and the MORNING OF SURGERY with CHG.   2. If you chose to wash your hair, wash your hair first as usual with your normal shampoo.  3. After you shampoo, rinse your hair and body thoroughly to remove the shampoo.    Wash your face and private area with the soap you use at home, then rinse.  4. Use CHG as you would any other liquid soap. You can apply CHG directly to the skin and wash gently with a scrungie or a clean washcloth.   5. Apply the CHG Soap to your body ONLY FROM THE NECK DOWN.  Do not use on open wounds or open sores. Avoid contact with your eyes, ears, mouth and genitals (private parts). Wash Face and genitals (private parts)  with your normal soap.  6. Wash thoroughly, paying special attention to the area where your surgery will be performed.  7. Thoroughly rinse your body with warm water from the neck down.  8. DO NOT shower/wash with your normal soap after using and rinsing off the CHG Soap.  9. Pat yourself dry with a CLEAN TOWEL.  10. Wear CLEAN PAJAMAS to bed the night before surgery, wear comfortable clothes the morning of surgery  11. Place CLEAN SHEETS on your bed the night of your first shower and DO NOT SLEEP WITH PETS.  Day of Surgery: Shower as above Do not apply any deodorants/lotions, powders  and colognes. Please wear clean clothes to the hospital/surgery center.    Do not shave 48 hours prior to surgery.  Men may shave face and neck.  Do not bring valuables to the hospital.  [facility] is not responsible for any belongings or valuables.  Contacts, dentures or bridgework may not be worn into surgery.  Leave your suitcase in the car.  After surgery it may be brought to your room.  For patients admitted to the hospital, discharge time will be determined by your treatment team.  Patients discharged the day of surgery will not be allowed to drive home.   Please read over the following fact sheets that you were given.

## 2017-09-07 NOTE — Progress Notes (Signed)
Spoke with Manuela Schwartz at Dr Hendrickson's office states ok to continue 81mg  aspirin. Pt called and informed

## 2017-09-08 LAB — ABO/RH: ABO/RH(D): B NEG

## 2017-09-08 NOTE — Progress Notes (Signed)
Anesthesia Chart Review:  Pt is a 76 year old females scheduled for subxyphoid pericardial window on 10/01/2017 with Modesto Charon, MD  - PCP is Assunta Found, MD - Cardiologist is Daneen Schick, MD.  Last office visit 08/16/17 with Truitt Merle, NP - Pulmonologist is Kara Mead, MD who is aware of upcoming surgery - Oncologist is Lorna Few, MD  PMH includes:  CAD (DES to RCA 2011), atrial fibrillation, HTN, hyperlipidemia, hypothyroidism, COPD, lung cancer, anemia, GERD. Uses 4L O2 at all times. Former smoker (quit 2015). BMI 24.5.   - Hospitalized 10/29-11/1/18 for pericardial effusion, underwent pericardiocentesis removing 900cc pericardial fluid  - S/p thoracentesis 08/25/17 that removed 900cc fluid from L pleural space  Medications include: amlodipine, ASA 81mg , lipitor, pulmicort, iron, duoneb, levothyroxine, metoprolol, prilosec, prednisone  BP 135/66   Pulse 63   Temp 36.8 C (Oral)   Resp 18   Ht 5\' 4"  (1.626 m)   Wt 143 lb (64.9 kg)   SpO2 99%   BMI 24.55 kg/m   Preoperative labs reviewed.   - UA consistent with UTI. Pre-admission testing RN notified Manuela Schwartz in Dr. Leonarda Salon office.   CXR will be obtained day of surgery  CT chest 08/22/17:  1. Interval decrease in size of mass within the lateral limb left adrenal gland. 2. Interval increase in size of mediastinal adenopathy. 3. Interval increase in size of moderate pericardial effusion. 4. Interval increase in size of moderate left pleural effusion. 5. Similar-appearing pulmonary fibrosis and regions of airspace opacity in the left lung. 6. Within the medial interpolar region of the right kidney there is suggestion of and enhancing renal mass.   EKG 08/16/17: NSR. Possible LA enlargement. Septal infarct, age undetermined. ST & T abnormality, consider inferolateral ischemia.   Echo 08/24/17:  - Left ventricle: Wall thickness was increased in a pattern of moderate LVH. Systolic function was normal. The  estimated ejection fraction was in the range of 55% to 60%. Doppler parameters are consistent with high ventricular filling pressure. - Left atrium: The atrium was mildly dilated. - Pericardium, extracardiac: There was moderate thickening. A moderate (1.6cm) pericardial effusion was identified circumferential to the heart. The fluid exhibited a fibrinous appearance. The appearance is consistent with an effusive-constrictive pathology. There was moderateright atrial chamber collapse. There was a left pleural effusion (6cm). - Impressions: Recurrent pericardial and pleural effusion with marked thickening of the pericardium (prior radiation noted). Question the need for pericardial stripping (pericardectomy) to avoid constrictive physiology.  Cardiac event monitor 02/17/15:  - question brief atrial fibrillation runs. Start metoprolol  Cardiac cath 03/16/10:  - LM: distal 3-40% narrowing - LAD: midvessel tubular eccentric 65-75% stenosis which also encompasses the ostium of a small to moderate sized branching diagonal. Moderate irregularities up to 30-40% proximally.  - CX: Two OM branches.  OM1 50-60% ostial/proximal narrowing - RCA: 90-95% distal stenosis for large PDA. PDA/continuation of RCA also diseased with borderline significant obstruction in both branches up to 75%  Cardiac cath 03/19/10:  - DES to RCA  - Fractional flow reserve of 0.88 cross the borderline stenosis in the mid LAD. This was hemodynamically insignificant  Pt was at her baseline respiratory status at pre-admission testing.    If no changes, I anticipate pt can proceed with surgery as scheduled.   Willeen Cass, FNP-BC [facility]/Anesthesiology Phone: [phone] 09/08/2017 10:23 AM

## 2017-09-09 NOTE — Progress Notes (Signed)
Spoke to patient she advised the earliest she could leave her home is 0800 with an estimated arrival at 41. Spoke to Quincy Simmonds, PA with [facility] she will have Dr. Servando Snare notify Dr. Roxan Hockey during sign out.

## 2017-09-11 ENCOUNTER — Encounter (HOSPITAL_COMMUNITY)
Admission: RE | Disposition: E | Payer: Self-pay | Source: Ambulatory Visit | Attending: Thoracic Surgery (Cardiothoracic Vascular Surgery)

## 2017-09-11 ENCOUNTER — Inpatient Hospital Stay (HOSPITAL_COMMUNITY)
Admission: RE | Admit: 2017-09-11 | Discharge: 2017-10-10 | DRG: 270 | Disposition: E | Payer: PPO | Source: Ambulatory Visit | Attending: Thoracic Surgery (Cardiothoracic Vascular Surgery) | Admitting: Thoracic Surgery (Cardiothoracic Vascular Surgery)

## 2017-09-11 ENCOUNTER — Inpatient Hospital Stay (HOSPITAL_COMMUNITY): Payer: PPO | Admitting: Emergency Medicine

## 2017-09-11 ENCOUNTER — Other Ambulatory Visit: Payer: Self-pay

## 2017-09-11 ENCOUNTER — Inpatient Hospital Stay (HOSPITAL_COMMUNITY): Payer: PPO

## 2017-09-11 ENCOUNTER — Encounter (HOSPITAL_COMMUNITY): Payer: Self-pay | Admitting: Certified Registered"

## 2017-09-11 ENCOUNTER — Inpatient Hospital Stay (HOSPITAL_COMMUNITY): Payer: PPO | Admitting: Certified Registered Nurse Anesthetist

## 2017-09-11 DIAGNOSIS — J181 Lobar pneumonia, unspecified organism: Secondary | ICD-10-CM | POA: Diagnosis not present

## 2017-09-11 DIAGNOSIS — J189 Pneumonia, unspecified organism: Secondary | ICD-10-CM

## 2017-09-11 DIAGNOSIS — T402X5A Adverse effect of other opioids, initial encounter: Secondary | ICD-10-CM | POA: Diagnosis not present

## 2017-09-11 DIAGNOSIS — I739 Peripheral vascular disease, unspecified: Secondary | ICD-10-CM | POA: Diagnosis not present

## 2017-09-11 DIAGNOSIS — C7970 Secondary malignant neoplasm of unspecified adrenal gland: Secondary | ICD-10-CM | POA: Diagnosis not present

## 2017-09-11 DIAGNOSIS — K219 Gastro-esophageal reflux disease without esophagitis: Secondary | ICD-10-CM | POA: Diagnosis not present

## 2017-09-11 DIAGNOSIS — Z7982 Long term (current) use of aspirin: Secondary | ICD-10-CM

## 2017-09-11 DIAGNOSIS — Z4682 Encounter for fitting and adjustment of non-vascular catheter: Secondary | ICD-10-CM | POA: Diagnosis not present

## 2017-09-11 DIAGNOSIS — J441 Chronic obstructive pulmonary disease with (acute) exacerbation: Secondary | ICD-10-CM | POA: Diagnosis not present

## 2017-09-11 DIAGNOSIS — I10 Essential (primary) hypertension: Secondary | ICD-10-CM | POA: Diagnosis present

## 2017-09-11 DIAGNOSIS — C3492 Malignant neoplasm of unspecified part of left bronchus or lung: Secondary | ICD-10-CM | POA: Diagnosis not present

## 2017-09-11 DIAGNOSIS — J449 Chronic obstructive pulmonary disease, unspecified: Secondary | ICD-10-CM

## 2017-09-11 DIAGNOSIS — Z66 Do not resuscitate: Secondary | ICD-10-CM | POA: Diagnosis present

## 2017-09-11 DIAGNOSIS — N179 Acute kidney failure, unspecified: Secondary | ICD-10-CM | POA: Diagnosis not present

## 2017-09-11 DIAGNOSIS — R112 Nausea with vomiting, unspecified: Secondary | ICD-10-CM | POA: Diagnosis not present

## 2017-09-11 DIAGNOSIS — Z79899 Other long term (current) drug therapy: Secondary | ICD-10-CM

## 2017-09-11 DIAGNOSIS — I313 Pericardial effusion (noninflammatory): Principal | ICD-10-CM | POA: Diagnosis present

## 2017-09-11 DIAGNOSIS — J9621 Acute and chronic respiratory failure with hypoxia: Secondary | ICD-10-CM | POA: Diagnosis not present

## 2017-09-11 DIAGNOSIS — R0602 Shortness of breath: Secondary | ICD-10-CM

## 2017-09-11 DIAGNOSIS — Z79890 Hormone replacement therapy (postmenopausal): Secondary | ICD-10-CM

## 2017-09-11 DIAGNOSIS — J90 Pleural effusion, not elsewhere classified: Secondary | ICD-10-CM | POA: Diagnosis present

## 2017-09-11 DIAGNOSIS — R0603 Acute respiratory distress: Secondary | ICD-10-CM

## 2017-09-11 DIAGNOSIS — E039 Hypothyroidism, unspecified: Secondary | ICD-10-CM | POA: Diagnosis not present

## 2017-09-11 DIAGNOSIS — I700 Atherosclerosis of aorta: Secondary | ICD-10-CM | POA: Diagnosis not present

## 2017-09-11 DIAGNOSIS — Z09 Encounter for follow-up examination after completed treatment for conditions other than malignant neoplasm: Secondary | ICD-10-CM

## 2017-09-11 DIAGNOSIS — E785 Hyperlipidemia, unspecified: Secondary | ICD-10-CM | POA: Diagnosis present

## 2017-09-11 DIAGNOSIS — E871 Hypo-osmolality and hyponatremia: Secondary | ICD-10-CM | POA: Diagnosis present

## 2017-09-11 DIAGNOSIS — J9382 Other air leak: Secondary | ICD-10-CM | POA: Diagnosis not present

## 2017-09-11 DIAGNOSIS — D649 Anemia, unspecified: Secondary | ICD-10-CM | POA: Diagnosis not present

## 2017-09-11 DIAGNOSIS — J849 Interstitial pulmonary disease, unspecified: Secondary | ICD-10-CM | POA: Diagnosis not present

## 2017-09-11 DIAGNOSIS — Z85118 Personal history of other malignant neoplasm of bronchus and lung: Secondary | ICD-10-CM

## 2017-09-11 DIAGNOSIS — J9601 Acute respiratory failure with hypoxia: Secondary | ICD-10-CM | POA: Diagnosis not present

## 2017-09-11 DIAGNOSIS — K5900 Constipation, unspecified: Secondary | ICD-10-CM | POA: Diagnosis present

## 2017-09-11 DIAGNOSIS — I2510 Atherosclerotic heart disease of native coronary artery without angina pectoris: Secondary | ICD-10-CM | POA: Diagnosis present

## 2017-09-11 DIAGNOSIS — Z9981 Dependence on supplemental oxygen: Secondary | ICD-10-CM

## 2017-09-11 DIAGNOSIS — E875 Hyperkalemia: Secondary | ICD-10-CM | POA: Diagnosis not present

## 2017-09-11 DIAGNOSIS — F419 Anxiety disorder, unspecified: Secondary | ICD-10-CM | POA: Diagnosis present

## 2017-09-11 DIAGNOSIS — I3139 Other pericardial effusion (noninflammatory): Secondary | ICD-10-CM

## 2017-09-11 DIAGNOSIS — Z7951 Long term (current) use of inhaled steroids: Secondary | ICD-10-CM

## 2017-09-11 DIAGNOSIS — Z9221 Personal history of antineoplastic chemotherapy: Secondary | ICD-10-CM

## 2017-09-11 DIAGNOSIS — Z923 Personal history of irradiation: Secondary | ICD-10-CM

## 2017-09-11 DIAGNOSIS — Z515 Encounter for palliative care: Secondary | ICD-10-CM | POA: Diagnosis not present

## 2017-09-11 DIAGNOSIS — Z9049 Acquired absence of other specified parts of digestive tract: Secondary | ICD-10-CM

## 2017-09-11 DIAGNOSIS — J948 Other specified pleural conditions: Secondary | ICD-10-CM | POA: Diagnosis not present

## 2017-09-11 DIAGNOSIS — R001 Bradycardia, unspecified: Secondary | ICD-10-CM | POA: Diagnosis not present

## 2017-09-11 DIAGNOSIS — I319 Disease of pericardium, unspecified: Secondary | ICD-10-CM | POA: Diagnosis not present

## 2017-09-11 DIAGNOSIS — E878 Other disorders of electrolyte and fluid balance, not elsewhere classified: Secondary | ICD-10-CM | POA: Diagnosis present

## 2017-09-11 DIAGNOSIS — I25119 Atherosclerotic heart disease of native coronary artery with unspecified angina pectoris: Secondary | ICD-10-CM | POA: Diagnosis not present

## 2017-09-11 DIAGNOSIS — R0609 Other forms of dyspnea: Secondary | ICD-10-CM | POA: Diagnosis not present

## 2017-09-11 DIAGNOSIS — R0600 Dyspnea, unspecified: Secondary | ICD-10-CM

## 2017-09-11 DIAGNOSIS — J690 Pneumonitis due to inhalation of food and vomit: Secondary | ICD-10-CM | POA: Diagnosis not present

## 2017-09-11 DIAGNOSIS — J431 Panlobular emphysema: Secondary | ICD-10-CM | POA: Diagnosis not present

## 2017-09-11 DIAGNOSIS — C3410 Malignant neoplasm of upper lobe, unspecified bronchus or lung: Secondary | ICD-10-CM | POA: Diagnosis not present

## 2017-09-11 DIAGNOSIS — Z87891 Personal history of nicotine dependence: Secondary | ICD-10-CM

## 2017-09-11 DIAGNOSIS — Z955 Presence of coronary angioplasty implant and graft: Secondary | ICD-10-CM

## 2017-09-11 DIAGNOSIS — J939 Pneumothorax, unspecified: Secondary | ICD-10-CM | POA: Diagnosis not present

## 2017-09-11 DIAGNOSIS — I480 Paroxysmal atrial fibrillation: Secondary | ICD-10-CM | POA: Diagnosis present

## 2017-09-11 DIAGNOSIS — T39395A Adverse effect of other nonsteroidal anti-inflammatory drugs [NSAID], initial encounter: Secondary | ICD-10-CM | POA: Diagnosis present

## 2017-09-11 DIAGNOSIS — Z7952 Long term (current) use of systemic steroids: Secondary | ICD-10-CM

## 2017-09-11 DIAGNOSIS — C3490 Malignant neoplasm of unspecified part of unspecified bronchus or lung: Secondary | ICD-10-CM | POA: Diagnosis not present

## 2017-09-11 DIAGNOSIS — L8990 Pressure ulcer of unspecified site, unspecified stage: Secondary | ICD-10-CM

## 2017-09-11 DIAGNOSIS — R918 Other nonspecific abnormal finding of lung field: Secondary | ICD-10-CM | POA: Diagnosis not present

## 2017-09-11 HISTORY — PX: CHEST TUBE INSERTION: SHX231

## 2017-09-11 HISTORY — PX: SUBXYPHOID PERICARDIAL WINDOW: SHX5075

## 2017-09-11 LAB — ECHO INTRAOPERATIVE TEE
HEIGHTINCHES: 64 in
WEIGHTICAEL: 2288 [oz_av]

## 2017-09-11 LAB — GRAM STAIN: Gram Stain: NONE SEEN

## 2017-09-11 LAB — LACTATE DEHYDROGENASE: LDH: 219 U/L — ABNORMAL HIGH (ref 98–192)

## 2017-09-11 SURGERY — CREATION, PERICARDIAL WINDOW, SUBXIPHOID APPROACH
Anesthesia: General | Site: Chest

## 2017-09-11 MED ORDER — ACETAMINOPHEN 160 MG/5ML PO SOLN
1000.0000 mg | Freq: Four times a day (QID) | ORAL | Status: DC
  Administered 2017-09-13: 1000 mg via ORAL
  Filled 2017-09-11: qty 40.6

## 2017-09-11 MED ORDER — SUCCINYLCHOLINE CHLORIDE 20 MG/ML IJ SOLN
INTRAMUSCULAR | Status: DC | PRN
  Administered 2017-09-11: 80 mg via INTRAVENOUS

## 2017-09-11 MED ORDER — FENTANYL CITRATE (PF) 100 MCG/2ML IJ SOLN
25.0000 ug | INTRAMUSCULAR | Status: DC | PRN
  Administered 2017-09-11 (×3): 50 ug via INTRAVENOUS

## 2017-09-11 MED ORDER — FENTANYL CITRATE (PF) 100 MCG/2ML IJ SOLN
INTRAMUSCULAR | Status: AC
  Administered 2017-09-11: 50 ug via INTRAVENOUS
  Filled 2017-09-11: qty 2

## 2017-09-11 MED ORDER — BOOST HIGH PROTEIN PO LIQD
1.0000 | Freq: Every day | ORAL | Status: DC
  Filled 2017-09-11 (×3): qty 237

## 2017-09-11 MED ORDER — ACETAMINOPHEN 10 MG/ML IV SOLN
INTRAVENOUS | Status: AC
Start: 2017-09-11 — End: 2017-09-11
  Administered 2017-09-11: 1000 mg
  Filled 2017-09-11: qty 100

## 2017-09-11 MED ORDER — LIDOCAINE-PRILOCAINE 2.5-2.5 % EX CREA: 1.0000 "application " | TOPICAL_CREAM | CUTANEOUS | Status: DC | PRN

## 2017-09-11 MED ORDER — BISACODYL 5 MG PO TBEC
10.0000 mg | DELAYED_RELEASE_TABLET | Freq: Every day | ORAL | Status: DC
  Administered 2017-09-12 – 2017-09-20 (×9): 10 mg via ORAL
  Filled 2017-09-11 (×11): qty 2

## 2017-09-11 MED ORDER — FENTANYL CITRATE (PF) 100 MCG/2ML IJ SOLN
INTRAMUSCULAR | Status: DC | PRN
  Administered 2017-09-11 (×5): 50 ug via INTRAVENOUS

## 2017-09-11 MED ORDER — ROCURONIUM BROMIDE 100 MG/10ML IV SOLN
INTRAVENOUS | Status: DC | PRN
  Administered 2017-09-11: 10 mg via INTRAVENOUS
  Administered 2017-09-11: 50 mg via INTRAVENOUS

## 2017-09-11 MED ORDER — AMLODIPINE BESYLATE 5 MG PO TABS
5.0000 mg | ORAL_TABLET | Freq: Every day | ORAL | Status: DC
  Administered 2017-09-12 – 2017-09-21 (×10): 5 mg via ORAL
  Filled 2017-09-11 (×10): qty 1

## 2017-09-11 MED ORDER — METOPROLOL SUCCINATE ER 50 MG PO TB24
50.0000 mg | ORAL_TABLET | Freq: Two times a day (BID) | ORAL | Status: DC
  Administered 2017-09-12 – 2017-09-20 (×11): 50 mg via ORAL
  Filled 2017-09-11 (×17): qty 1

## 2017-09-11 MED ORDER — FENTANYL CITRATE (PF) 100 MCG/2ML IJ SOLN
INTRAMUSCULAR | Status: AC
  Filled 2017-09-11: qty 2

## 2017-09-11 MED ORDER — PHENYLEPHRINE 40 MCG/ML (10ML) SYRINGE FOR IV PUSH (FOR BLOOD PRESSURE SUPPORT)
PREFILLED_SYRINGE | INTRAVENOUS | Status: DC | PRN
  Administered 2017-09-11 (×2): 80 ug via INTRAVENOUS
  Administered 2017-09-11: 40 ug via INTRAVENOUS

## 2017-09-11 MED ORDER — SUGAMMADEX SODIUM 200 MG/2ML IV SOLN
INTRAVENOUS | Status: DC | PRN
  Administered 2017-09-11: 200 mg via INTRAVENOUS

## 2017-09-11 MED ORDER — PROMETHAZINE HCL 25 MG/ML IJ SOLN
INTRAMUSCULAR | Status: AC
  Filled 2017-09-11: qty 1

## 2017-09-11 MED ORDER — ATORVASTATIN CALCIUM 80 MG PO TABS
80.0000 mg | ORAL_TABLET | Freq: Every day | ORAL | Status: DC
  Administered 2017-09-12 – 2017-09-21 (×10): 80 mg via ORAL
  Filled 2017-09-11 (×10): qty 1

## 2017-09-11 MED ORDER — ONDANSETRON HCL 4 MG/2ML IJ SOLN
INTRAMUSCULAR | Status: AC
  Filled 2017-09-11: qty 2

## 2017-09-11 MED ORDER — DEXTROSE 5 % IV SOLN
1.5000 g | Freq: Two times a day (BID) | INTRAVENOUS | Status: AC
  Administered 2017-09-11 – 2017-09-12 (×2): 1.5 g via INTRAVENOUS
  Filled 2017-09-11 (×2): qty 1.5

## 2017-09-11 MED ORDER — PHENYLEPHRINE 40 MCG/ML (10ML) SYRINGE FOR IV PUSH (FOR BLOOD PRESSURE SUPPORT)
PREFILLED_SYRINGE | INTRAVENOUS | Status: AC
  Filled 2017-09-11: qty 10

## 2017-09-11 MED ORDER — FENTANYL CITRATE (PF) 100 MCG/2ML IJ SOLN: 25.0000 ug | INTRAMUSCULAR | Status: DC | PRN

## 2017-09-11 MED ORDER — LIDOCAINE 2% (20 MG/ML) 5 ML SYRINGE
INTRAMUSCULAR | Status: AC
  Filled 2017-09-11: qty 5

## 2017-09-11 MED ORDER — ONDANSETRON HCL 4 MG/2ML IJ SOLN
4.0000 mg | Freq: Four times a day (QID) | INTRAMUSCULAR | Status: DC | PRN
  Administered 2017-09-13 – 2017-09-21 (×9): 4 mg via INTRAVENOUS
  Filled 2017-09-11 (×9): qty 2

## 2017-09-11 MED ORDER — SUCCINYLCHOLINE CHLORIDE 200 MG/10ML IV SOSY
PREFILLED_SYRINGE | INTRAVENOUS | Status: AC
  Filled 2017-09-11: qty 10

## 2017-09-11 MED ORDER — ACETAMINOPHEN 500 MG PO TABS
1000.0000 mg | ORAL_TABLET | Freq: Four times a day (QID) | ORAL | Status: DC
  Administered 2017-09-11 – 2017-09-15 (×13): 1000 mg via ORAL
  Filled 2017-09-11 (×15): qty 2

## 2017-09-11 MED ORDER — ONDANSETRON HCL 4 MG/2ML IJ SOLN
INTRAMUSCULAR | Status: DC | PRN
  Administered 2017-09-11: 4 mg via INTRAVENOUS

## 2017-09-11 MED ORDER — SUGAMMADEX SODIUM 200 MG/2ML IV SOLN
INTRAVENOUS | Status: AC
  Filled 2017-09-11: qty 2

## 2017-09-11 MED ORDER — PHENYLEPHRINE HCL 10 MG/ML IJ SOLN
INTRAVENOUS | Status: DC | PRN
  Administered 2017-09-11: 60 ug/min via INTRAVENOUS

## 2017-09-11 MED ORDER — PROPOFOL 10 MG/ML IV BOLUS
INTRAVENOUS | Status: DC | PRN
  Administered 2017-09-11: 80 mg via INTRAVENOUS

## 2017-09-11 MED ORDER — LEVOTHYROXINE SODIUM 25 MCG PO TABS
125.0000 ug | ORAL_TABLET | Freq: Every day | ORAL | Status: DC
  Administered 2017-09-12 – 2017-09-21 (×10): 125 ug via ORAL
  Filled 2017-09-11 (×10): qty 1

## 2017-09-11 MED ORDER — DEXAMETHASONE SODIUM PHOSPHATE 4 MG/ML IJ SOLN
INTRAMUSCULAR | Status: DC | PRN
  Administered 2017-09-11: 8 mg via INTRAVENOUS

## 2017-09-11 MED ORDER — ASPIRIN EC 81 MG PO TBEC
81.0000 mg | DELAYED_RELEASE_TABLET | Freq: Every day | ORAL | Status: DC
  Administered 2017-09-12 – 2017-09-21 (×10): 81 mg via ORAL
  Filled 2017-09-11 (×10): qty 1

## 2017-09-11 MED ORDER — FENTANYL CITRATE (PF) 250 MCG/5ML IJ SOLN
INTRAMUSCULAR | Status: AC
  Filled 2017-09-11: qty 5

## 2017-09-11 MED ORDER — PANTOPRAZOLE SODIUM 40 MG PO TBEC
40.0000 mg | DELAYED_RELEASE_TABLET | Freq: Every day | ORAL | Status: DC
  Administered 2017-09-12 – 2017-09-17 (×6): 40 mg via ORAL
  Filled 2017-09-11 (×6): qty 1

## 2017-09-11 MED ORDER — DEXTROSE-NACL 5-0.45 % IV SOLN
INTRAVENOUS | Status: DC
  Administered 2017-09-11 – 2017-09-12 (×3): via INTRAVENOUS

## 2017-09-11 MED ORDER — EPHEDRINE 5 MG/ML INJ
INTRAVENOUS | Status: AC
  Filled 2017-09-11: qty 10

## 2017-09-11 MED ORDER — PROSIGHT PO TABS
1.0000 | ORAL_TABLET | Freq: Two times a day (BID) | ORAL | Status: DC
  Administered 2017-09-11 – 2017-09-21 (×20): 1 via ORAL
  Filled 2017-09-11 (×21): qty 1

## 2017-09-11 MED ORDER — POTASSIUM CHLORIDE 10 MEQ/50ML IV SOLN: 10.0000 meq | Freq: Every day | INTRAVENOUS | Status: DC | PRN

## 2017-09-11 MED ORDER — ROCURONIUM BROMIDE 10 MG/ML (PF) SYRINGE
PREFILLED_SYRINGE | INTRAVENOUS | Status: AC
  Filled 2017-09-11: qty 5

## 2017-09-11 MED ORDER — OXYCODONE HCL 5 MG PO TABS
5.0000 mg | ORAL_TABLET | ORAL | Status: DC | PRN
  Administered 2017-09-11 – 2017-09-19 (×7): 10 mg via ORAL
  Filled 2017-09-11 (×6): qty 2

## 2017-09-11 MED ORDER — FERROUS SULFATE 325 (65 FE) MG PO TABS
325.0000 mg | ORAL_TABLET | Freq: Every day | ORAL | Status: DC
  Administered 2017-09-12 – 2017-09-15 (×4): 325 mg via ORAL
  Filled 2017-09-11 (×4): qty 1

## 2017-09-11 MED ORDER — BUDESONIDE 0.5 MG/2ML IN SUSP
0.5000 mg | Freq: Two times a day (BID) | RESPIRATORY_TRACT | Status: DC
  Administered 2017-09-11 – 2017-09-23 (×24): 0.5 mg via RESPIRATORY_TRACT
  Filled 2017-09-11 (×23): qty 2

## 2017-09-11 MED ORDER — PROPOFOL 10 MG/ML IV BOLUS
INTRAVENOUS | Status: AC
  Filled 2017-09-11: qty 20

## 2017-09-11 MED ORDER — NAPROXEN 250 MG PO TABS
250.0000 mg | ORAL_TABLET | Freq: Two times a day (BID) | ORAL | Status: DC
  Administered 2017-09-11 – 2017-09-13 (×4): 250 mg via ORAL
  Filled 2017-09-11 (×4): qty 1

## 2017-09-11 MED ORDER — PROMETHAZINE HCL 25 MG/ML IJ SOLN
6.2500 mg | INTRAMUSCULAR | Status: DC | PRN
  Administered 2017-09-11: 12.5 mg via INTRAVENOUS

## 2017-09-11 MED ORDER — LIDOCAINE 2% (20 MG/ML) 5 ML SYRINGE
INTRAMUSCULAR | Status: DC | PRN
  Administered 2017-09-11: 60 mg via INTRAVENOUS

## 2017-09-11 MED ORDER — TRAMADOL HCL 50 MG PO TABS
50.0000 mg | ORAL_TABLET | Freq: Four times a day (QID) | ORAL | Status: DC | PRN
  Administered 2017-09-16 (×2): 50 mg via ORAL
  Administered 2017-09-17 – 2017-09-18 (×2): 100 mg via ORAL
  Filled 2017-09-11: qty 2
  Filled 2017-09-11: qty 1
  Filled 2017-09-11: qty 2
  Filled 2017-09-11: qty 1

## 2017-09-11 MED ORDER — OXYCODONE HCL 5 MG PO TABS
ORAL_TABLET | ORAL | Status: AC
  Administered 2017-09-11: 10 mg via ORAL
  Filled 2017-09-11: qty 2

## 2017-09-11 MED ORDER — VANCOMYCIN HCL IN DEXTROSE 1-5 GM/200ML-% IV SOLN
1000.0000 mg | INTRAVENOUS | Status: AC
  Administered 2017-09-11: 1000 mg via INTRAVENOUS
  Filled 2017-09-11: qty 200

## 2017-09-11 MED ORDER — IPRATROPIUM-ALBUTEROL 0.5-2.5 (3) MG/3ML IN SOLN
3.0000 mL | Freq: Two times a day (BID) | RESPIRATORY_TRACT | Status: DC
  Administered 2017-09-11 – 2017-09-16 (×11): 3 mL via RESPIRATORY_TRACT
  Filled 2017-09-11 (×10): qty 3

## 2017-09-11 MED ORDER — 0.9 % SODIUM CHLORIDE (POUR BTL) OPTIME
TOPICAL | Status: DC | PRN
  Administered 2017-09-11: 2000 mL

## 2017-09-11 MED ORDER — DEXAMETHASONE SODIUM PHOSPHATE 10 MG/ML IJ SOLN
INTRAMUSCULAR | Status: AC
  Filled 2017-09-11: qty 1

## 2017-09-11 MED ORDER — SENNOSIDES-DOCUSATE SODIUM 8.6-50 MG PO TABS
1.0000 | ORAL_TABLET | Freq: Every day | ORAL | Status: DC
  Administered 2017-09-12 – 2017-09-17 (×6): 1 via ORAL
  Filled 2017-09-11 (×8): qty 1

## 2017-09-11 MED ORDER — LACTATED RINGERS IV SOLN
INTRAVENOUS | Status: DC
  Administered 2017-09-11: 09:00:00 via INTRAVENOUS

## 2017-09-11 MED ORDER — LACTATED RINGERS IV SOLN
INTRAVENOUS | Status: DC | PRN
  Administered 2017-09-11: 10:00:00 via INTRAVENOUS

## 2017-09-11 MED ORDER — PREDNISONE 10 MG PO TABS
10.0000 mg | ORAL_TABLET | Freq: Every day | ORAL | Status: DC
  Filled 2017-09-11: qty 1

## 2017-09-11 MED ORDER — VITAMIN D 1000 UNITS PO TABS
1000.0000 [IU] | ORAL_TABLET | Freq: Every day | ORAL | Status: DC
  Administered 2017-09-12 – 2017-09-21 (×10): 1000 [IU] via ORAL
  Filled 2017-09-11 (×10): qty 1

## 2017-09-11 SURGICAL SUPPLY — 52 items
ADH SKN CLS LQ APL DERMABOND (GAUZE/BANDAGES/DRESSINGS) ×1
CANISTER SUCT 3000ML PPV (MISCELLANEOUS) ×4 IMPLANT
CATH THORACIC 28FR (CATHETERS) ×4 IMPLANT
CATH THORACIC 28FR RT ANG (CATHETERS) IMPLANT
CATH THORACIC 36FR (CATHETERS) IMPLANT
CATH THORACIC 36FR RT ANG (CATHETERS) IMPLANT
CLIP VESOCCLUDE MED 24/CT (CLIP) ×4 IMPLANT
CLIP VESOCCLUDE SM WIDE 24/CT (CLIP) ×4 IMPLANT
CONN ST 1/4X3/8  BEN (MISCELLANEOUS) ×2
CONN ST 1/4X3/8 BEN (MISCELLANEOUS) ×2 IMPLANT
CONT SPEC 4OZ CLIKSEAL STRL BL (MISCELLANEOUS) ×16 IMPLANT
DERMABOND ADHESIVE PROPEN (GAUZE/BANDAGES/DRESSINGS) ×2
DERMABOND ADVANCED .7 DNX6 (GAUZE/BANDAGES/DRESSINGS) ×2 IMPLANT
DRAIN CHANNEL 28F RND 3/8 FF (WOUND CARE) IMPLANT
DRAIN CHANNEL 32F RND 10.7 FF (WOUND CARE) IMPLANT
DRAPE LAPAROSCOPIC ABDOMINAL (DRAPES) ×4 IMPLANT
DRAPE SLUSH/WARMER DISC (DRAPES) IMPLANT
ELECT REM PT RETURN 9FT ADLT (ELECTROSURGICAL) ×4
ELECTRODE REM PT RTRN 9FT ADLT (ELECTROSURGICAL) ×2 IMPLANT
GAUZE SPONGE 4X4 12PLY STRL (GAUZE/BANDAGES/DRESSINGS) ×4 IMPLANT
GLOVE BIO SURGEON STRL SZ 6.5 (GLOVE) ×6 IMPLANT
GLOVE BIO SURGEON STRL SZ7.5 (GLOVE) ×8 IMPLANT
GLOVE BIO SURGEONS STRL SZ 6.5 (GLOVE) ×2
GLOVE SURG SIGNA 7.5 PF LTX (GLOVE) ×8 IMPLANT
GOWN STRL REUS W/ TWL LRG LVL3 (GOWN DISPOSABLE) ×2 IMPLANT
GOWN STRL REUS W/ TWL XL LVL3 (GOWN DISPOSABLE) ×8 IMPLANT
GOWN STRL REUS W/TWL LRG LVL3 (GOWN DISPOSABLE) ×2
GOWN STRL REUS W/TWL XL LVL3 (GOWN DISPOSABLE) ×8
KIT BASIN OR (CUSTOM PROCEDURE TRAY) ×4 IMPLANT
KIT ROOM TURNOVER OR (KITS) ×4 IMPLANT
KIT SUCTION CATH 14FR (SUCTIONS) IMPLANT
NS IRRIG 1000ML POUR BTL (IV SOLUTION) ×8 IMPLANT
PACK CHEST (CUSTOM PROCEDURE TRAY) ×4 IMPLANT
PAD ARMBOARD 7.5X6 YLW CONV (MISCELLANEOUS) ×8 IMPLANT
PAD ELECT DEFIB RADIOL ZOLL (MISCELLANEOUS) ×4 IMPLANT
STAPLER VISISTAT 35W (STAPLE) IMPLANT
SUT SILK  1 MH (SUTURE) ×4
SUT SILK 1 MH (SUTURE) ×4 IMPLANT
SUT VIC AB 1 CTX 36 (SUTURE) ×2
SUT VIC AB 1 CTX36XBRD ANBCTR (SUTURE) ×2 IMPLANT
SUT VIC AB 2-0 CTX 36 (SUTURE) ×4 IMPLANT
SUT VIC AB 3-0 X1 27 (SUTURE) ×4 IMPLANT
SWAB COLLECTION DEVICE MRSA (MISCELLANEOUS) IMPLANT
SWAB CULTURE ESWAB REG 1ML (MISCELLANEOUS) IMPLANT
SYSTEM SAHARA CHEST DRAIN ATS (WOUND CARE) ×12 IMPLANT
TAPE CLOTH SURG 4X10 WHT LF (GAUZE/BANDAGES/DRESSINGS) ×4 IMPLANT
TOWEL GREEN STERILE (TOWEL DISPOSABLE) ×8 IMPLANT
TOWEL OR 17X24 6PK STRL BLUE (TOWEL DISPOSABLE) ×4 IMPLANT
TOWEL OR 17X26 10 PK STRL BLUE (TOWEL DISPOSABLE) ×4 IMPLANT
TRAP SPECIMEN MUCOUS 40CC (MISCELLANEOUS) ×12 IMPLANT
TRAY FOLEY W/METER SILVER 16FR (SET/KITS/TRAYS/PACK) ×4 IMPLANT
WATER STERILE IRR 1000ML POUR (IV SOLUTION) ×8 IMPLANT

## 2017-09-11 NOTE — Interval H&P Note (Signed)
History and Physical Interval Note: CXR shows increased left pleural effusion. Discussed left chest tube placement at time of pericardial window with Renee Vance and her husband. She agrees to proceed.   09/28/2017 12:29 PM  Renee Vance  has presented today for surgery, with the diagnosis of Pericardial Effusion  The various methods of treatment have been discussed with the patient and family. After consideration of risks, benefits and other options for treatment, the patient has consented to  Procedure(s): SUBXYPHOID PERICARDIAL WINDOW (N/A) CHEST TUBE INSERTION (Left) as a surgical intervention .  The patient's history has been reviewed, patient examined, no change in status, stable for surgery.  I have reviewed the patient's chart and labs.  Questions were answered to the patient's satisfaction.     Melrose Nakayama

## 2017-09-11 NOTE — Progress Notes (Signed)
CXR completed

## 2017-09-11 NOTE — Progress Notes (Signed)
Pt's husband updated - given 2 c unit number -pt's husband had been in to see pt earlier while in PACU (aware of bed wait)

## 2017-09-11 NOTE — Plan of Care (Signed)
Continue current care plan 

## 2017-09-11 NOTE — Transfer of Care (Signed)
Immediate Anesthesia Transfer of Care Note  Patient: Renee Vance  Procedure(s) Performed: SUBXYPHOID PERICARDIAL WINDOW (N/A Chest) CHEST TUBE INSERTION (Left Chest)  Patient Location: PACU  Anesthesia Type:General  Level of Consciousness: awake, oriented and patient cooperative  Airway & Oxygen Therapy: Patient Spontanous Breathing and Patient connected to face mask oxygen  Post-op Assessment: Report given to RN and Post -op Vital signs reviewed and stable  Post vital signs: Reviewed and stable  Last Vitals:  Vitals:   09/19/2017 0823  BP: (!) 167/70  Pulse: 72  Resp: 20  Temp: 36.7 C  SpO2: 100%    Last Pain:  Vitals:   09/28/2017 0823  TempSrc: Oral      Patients Stated Pain Goal: 2 (16/01/09 3235)  Complications: No apparent anesthesia complications

## 2017-09-11 NOTE — Anesthesia Procedure Notes (Signed)
Arterial Line Insertion Start/End12/25/2018 9:15 AM, 10/09/2017 9:25 AM Performed by: Harden Mo, CRNA, CRNA  Patient location: Pre-op. Preanesthetic checklist: patient identified, IV checked, site marked, risks and benefits discussed, surgical consent, monitors and equipment checked, pre-op evaluation and anesthesia consent Lidocaine 1% used for infiltration Left, radial was placed Catheter size: 20 G Hand hygiene performed  and maximum sterile barriers used   Attempts: 1 Procedure performed without using ultrasound guided technique. Ultrasound Notes:anatomy identified, needle tip was noted to be adjacent to the nerve/plexus identified and no ultrasound evidence of intravascular and/or intraneural injection Following insertion, dressing applied and Biopatch. Post procedure assessment: normal  Patient tolerated the procedure well with no immediate complications.

## 2017-09-11 NOTE — Anesthesia Postprocedure Evaluation (Signed)
Anesthesia Post Note  Patient: Renee Vance  Procedure(s) Performed: SUBXYPHOID PERICARDIAL WINDOW (N/A Chest) CHEST TUBE INSERTION (Left Chest)     Patient location during evaluation: PACU Anesthesia Type: General Level of consciousness: awake and alert Pain management: pain level controlled Vital Signs Assessment: post-procedure vital signs reviewed and stable Respiratory status: spontaneous breathing, nonlabored ventilation, respiratory function stable and patient connected to nasal cannula oxygen Cardiovascular status: blood pressure returned to baseline and stable Postop Assessment: no apparent nausea or vomiting Anesthetic complications: no    Last Vitals:  Vitals:   09/18/2017 1505 10/06/2017 1530  BP: 108/61   Pulse: 67 65  Resp: 16 18  Temp:    SpO2: 100% 100%    Last Pain:  Vitals:   09/27/2017 1545  TempSrc:   PainSc: [name]

## 2017-09-11 NOTE — Anesthesia Procedure Notes (Signed)
Procedure Name: Intubation Date/Time: 09/10/2017 11:13 AM Performed by: Orlie Dakin, CRNA Pre-anesthesia Checklist: Patient identified, Emergency Drugs available, Suction available, Patient being monitored and Timeout performed Patient Re-evaluated:Patient Re-evaluated prior to induction Oxygen Delivery Method: Circle system utilized Preoxygenation: Pre-oxygenation with 100% oxygen Induction Type: IV induction Ventilation: Mask ventilation without difficulty and Oral airway inserted - appropriate to patient size Laryngoscope Size: Sabra Heck and 3 Grade View: Grade I Tube type: Oral Tube size: 7.5 mm Number of attempts: 1 Airway Equipment and Method: Stylet Placement Confirmation: ETT inserted through vocal cords under direct vision,  positive ETCO2 and breath sounds checked- equal and bilateral Secured at: 22 cm Tube secured with: Tape Dental Injury: Teeth and Oropharynx as per pre-operative assessment

## 2017-09-11 NOTE — Op Note (Signed)
NAMEFREDA, JAQUITH            ACCOUNT NO.:  [id number]  MEDICAL RECORD NO.:  [id number]  LOCATION:                                 FACILITY:  PHYSICIAN:  Revonda Standard. Roxan Hockey, M.D.DATE OF BIRTH:  [birth year]  DATE OF PROCEDURE:  09/22/2017 DATE OF DISCHARGE:                              OPERATIVE REPORT   PREOPERATIVE DIAGNOSES:  Pericardial effusion and left pleural effusion.  POSTOPERATIVE DIAGNOSES:  Pericardial effusion and left pleural effusion.  PROCEDURE:  Subxiphoid pericardial window and left chest tube placement.  SURGEON:  Revonda Standard. Roxan Hockey, MD.  ASSISTANT:  Jadene Pierini, PA-C.  ANESTHESIA:  General.  FINDINGS:  Approximately 500 mL of amber fluid evacuated from Pericardium. Approximately a liter of serous fluid from left pleural space.  Post-drainage transesophageal echocardiography revealed near- complete resolution of the pericardial effusion.  CLINICAL NOTE:  Ms. Disbrow is a 76 year old woman with numerous medical problems including stage IV lung cancer.  She developed a pericardial effusion and required pericardiocentesis about 6 weeks ago. She also had a thoracentesis at that time.  She developed recurrence of both the pericardial and pleural effusions.  She was referred for subxiphoid pericardial window.  The indications, risks, benefits, and alternatives were discussed in detail with the patient.  We also discussed possibly placing a left chest tube at the same setting while under anesthesia.  The indications, risks, benefits, and alternatives of that were discussed as well.  She understood and accepted the risks and wished to proceed.  OPERATIVE NOTE:  Mrs. Frane was brought to the operating room on September 11, 2017.  She had induction of general anesthesia.  Intravenous antibiotics were administered.  A Foley catheter was placed.  Sequential compression devices were placed on the calves for DVT prophylaxis.  The chest and abdomen were  prepped and draped in the usual sterile fashion. After performing a time-out, an incision was made in the midline centered over the xiphoid process.  It was carried through the skin and subcutaneous tissue.  The xiphoid was cartilaginous.  Upward retraction was placed on the xiphoid and the muscular attachments were taken off. The mediastinal fat then was dissected off.  The pericardium was identified.  The pericardium was incised and there was an outpouring of amber-colored fluid, which was sent for pathology as well as chemistries and AFB, fungal, bacterial, and viral cultures.  A 2 x 2 cm piece of the pericardium then was excised using electrocautery.  A sucker was advanced into the pericardium and the remainder of the effusion was evacuated.  A 28-French Blake drain was tunneled through a separate subcostal incision and tunneled into the pericardial space.  It was secured to skin with #1 silk suture.  An incision was then made in the left anterolateral chest just below the mammary fold.  The chest was entered bluntly using a hemostat.  A 28-French chest tube then was placed into the left chest.  Approximately a liter of serous fluid was evacuated.  This fluid was also sent for cytology.  The chest tube was secured with #1 silk suture.  The wound then was copiously irrigated with warm saline.  A #1 Vicryl fascial suture was followed by 2-0 Vicryl  subcutaneous suture and a 3-0 Vicryl subcuticular suture.  Dermabond was applied.  The chest tubes were placed to suction.  The patient was extubated in the operating room and taken to the postanesthetic care unit in good condition.     Revonda Standard Roxan Hockey, M.D.     SCH/MEDQ  D:  09/13/2017  T:  09/30/2017  Job:  [id number]

## 2017-09-11 NOTE — Brief Op Note (Addendum)
09/20/2017  12:15 PM  PATIENT:  Renee Vance  76 y.o. female  PRE-OPERATIVE DIAGNOSIS:  Pericardial Effusion  POST-OPERATIVE DIAGNOSIS:  Pericardial Effusion and pleural effusion  PROCEDURE:  Procedure(s): SUBXYPHOID PERICARDIAL WINDOW (N/A) CHEST TUBE INSERTION (Left)  SURGEON:  Surgeon(s) and Role:    * Melrose Nakayama, MD - Primary  PHYSICIAN ASSISTANT: WAYNE GOLD PA-C  ANESTHESIA:   general  EBL: MINIMAL  BLOOD ADMINISTERED:none  DRAINS: 1 28 F CHEST TUBE IN LEFT HEMITHORAX AND 1 28 F BARD PERICARDIUM   LOCAL MEDICATIONS USED:  NONE  SPECIMEN:  Source of Specimen:  PERIVARDIUM /PLEURAL AND PERICARDIAL FLUID  DISPOSITION OF SPECIMEN:  PATH/LAB/MICRO  COUNTS:  YES  TOURNIQUET:  * No tourniquets in log *  DICTATION: .Other Dictation: Dictation Number [id number]  PLAN OF CARE: Admit to inpatient   PATIENT DISPOSITION:  PACU - hemodynamically stable.   Delay start of Pharmacological VTE agent (>24hrs) due to surgical blood loss or risk of bleeding: yes

## 2017-09-11 NOTE — Anesthesia Preprocedure Evaluation (Addendum)
Anesthesia Evaluation  Patient identified by MRN, date of birth, ID band Patient awake    Reviewed: Allergy & Precautions, NPO status , Patient's Chart, lab work & pertinent test results, reviewed documented beta blocker date and time   Airway Mallampati: II  TM Distance: >3 FB Neck ROM: Full    Dental  (+) Dental Advisory Given   Pulmonary COPD,  COPD inhaler and oxygen dependent, former smoker,  Lung CA s/p radiation. Left pleural effusion.    + decreased breath sounds      Cardiovascular hypertension, Pt. on medications and Pt. on home beta blockers + CAD, + Cardiac Stents and + Peripheral Vascular Disease  + dysrhythmias  Rhythm:Regular Rate:Normal     Neuro/Psych negative neurological ROS     GI/Hepatic Neg liver ROS, GERD  ,  Endo/Other  Hypothyroidism   Renal/GU CRFRenal disease     Musculoskeletal  (+) Arthritis ,   Abdominal   Peds  Hematology  (+) anemia ,   Anesthesia Other Findings   Reproductive/Obstetrics                            Lab Results  Component Value Date   WBC 9.8 09/07/2017   HGB 9.0 (L) 09/07/2017   HCT 29.7 (L) 09/07/2017   MCV 100.3 (H) 09/07/2017   PLT 204 09/07/2017   Lab Results  Component Value Date   CREATININE 1.37 (H) 09/07/2017   BUN 33 (H) 09/07/2017   NA 135 09/07/2017   K 5.0 09/07/2017   CL 100 (L) 09/07/2017   CO2 28 09/07/2017    Anesthesia Physical Anesthesia Plan  ASA: IV  Anesthesia Plan: General   Post-op Pain Management:    Induction: Intravenous  PONV Risk Score and Plan: 3 and Ondansetron, Dexamethasone and Treatment may vary due to age or medical condition  Airway Management Planned: Oral ETT  Additional Equipment: Arterial line  Intra-op Plan:   Post-operative Plan: Extubation in OR and Possible Post-op intubation/ventilation  Informed Consent: I have reviewed the patients History and Physical, chart, labs and  discussed the procedure including the risks, benefits and alternatives for the proposed anesthesia with the patient or authorized representative who has indicated his/her understanding and acceptance.   Dental advisory given  Plan Discussed with: CRNA  Anesthesia Plan Comments:        Anesthesia Quick Evaluation

## 2017-09-12 ENCOUNTER — Encounter (HOSPITAL_COMMUNITY): Payer: Self-pay | Admitting: Thoracic Surgery (Cardiothoracic Vascular Surgery)

## 2017-09-12 DIAGNOSIS — L8990 Pressure ulcer of unspecified site, unspecified stage: Secondary | ICD-10-CM

## 2017-09-12 LAB — CBC
HCT: 28.8 % — ABNORMAL LOW (ref 36.0–46.0)
HEMOGLOBIN: 8.6 g/dL — AB (ref 12.0–15.0)
MCH: 30.6 pg (ref 26.0–34.0)
MCHC: 29.9 g/dL — AB (ref 30.0–36.0)
MCV: 102.5 fL — ABNORMAL HIGH (ref 78.0–100.0)
PLATELETS: 179 10*3/uL (ref 150–400)
RBC: 2.81 MIL/uL — ABNORMAL LOW (ref 3.87–5.11)
RDW: 17.4 % — AB (ref 11.5–15.5)
WBC: 9.2 10*3/uL (ref 4.0–10.5)

## 2017-09-12 LAB — BASIC METABOLIC PANEL
Anion gap: 8 (ref 5–15)
BUN: 31 mg/dL — AB (ref 6–20)
CALCIUM: 8.5 mg/dL — AB (ref 8.9–10.3)
CHLORIDE: 95 mmol/L — AB (ref 101–111)
CO2: 30 mmol/L (ref 22–32)
CREATININE: 1.56 mg/dL — AB (ref 0.44–1.00)
GFR calc Af Amer: 36 mL/min — ABNORMAL LOW (ref >60)
GFR calc non Af Amer: 31 mL/min — ABNORMAL LOW (ref >60)
Glucose, Bld: 162 mg/dL — ABNORMAL HIGH (ref 65–99)
Potassium: 5 mmol/L (ref 3.5–5.1)
SODIUM: 133 mmol/L — AB (ref 135–145)

## 2017-09-12 LAB — BLOOD GAS, ARTERIAL
ACID-BASE EXCESS: 5.5 mmol/L — AB (ref 0.0–2.0)
BICARBONATE: 30.3 mmol/L — AB (ref 20.0–28.0)
Drawn by: 41977
O2 CONTENT: 2 L/min
O2 SAT: 94.8 %
PCO2 ART: 51.4 mmHg — AB (ref 32.0–48.0)
PO2 ART: 76.3 mmHg — AB (ref 83.0–108.0)
Patient temperature: 98.6
pH, Arterial: 7.389 (ref 7.350–7.450)

## 2017-09-12 LAB — BODY FLUID CELL COUNT WITH DIFFERENTIAL: Total Nucleated Cell Count, Fluid: 3 cu mm (ref 0–1000)

## 2017-09-12 LAB — PATHOLOGIST SMEAR REVIEW

## 2017-09-12 MED ORDER — ENOXAPARIN SODIUM 40 MG/0.4ML ~~LOC~~ SOLN: 40.0000 mg | SUBCUTANEOUS | Status: DC

## 2017-09-12 MED ORDER — PREDNISONE 20 MG PO TABS: 20.0000 mg | ORAL_TABLET | Freq: Every day | ORAL | Status: DC

## 2017-09-12 MED ORDER — ENOXAPARIN SODIUM 30 MG/0.3ML ~~LOC~~ SOLN
30.0000 mg | SUBCUTANEOUS | Status: DC
  Administered 2017-09-12 – 2017-09-21 (×10): 30 mg via SUBCUTANEOUS
  Filled 2017-09-12 (×10): qty 0.3

## 2017-09-12 MED ORDER — PREDNISONE 20 MG PO TABS
20.0000 mg | ORAL_TABLET | Freq: Every day | ORAL | Status: DC
  Administered 2017-09-12 – 2017-09-14 (×3): 20 mg via ORAL
  Filled 2017-09-12 (×3): qty 1

## 2017-09-12 MED ORDER — PREMIER PROTEIN SHAKE
2.0000 [oz_av] | Freq: Every day | ORAL | Status: DC
  Administered 2017-09-12 – 2017-09-19 (×2): 2 [oz_av] via ORAL
  Filled 2017-09-12 (×13): qty 325.31

## 2017-09-12 NOTE — Progress Notes (Signed)
[address]       [place] [address]             [phone]      1 Day Post-Op Procedure(s) (LRB): SUBXYPHOID PERICARDIAL WINDOW (N/A) CHEST TUBE INSERTION (Left) Subjective: Feels pretty well, not SOB currently  Objective: Vital signs in last 24 hours: Temp:  [97.2 F (36.2 C)-98.1 F (36.7 C)] 97.9 F (36.6 C) (12/04 0813) Pulse Rate:  [56-87] 56 (12/04 0813) Cardiac Rhythm: Normal sinus rhythm (12/04 0254) Resp:  [10-34] 22 (12/04 0813) BP: (88-167)/(54-72) 101/54 (12/04 0813) SpO2:  [92 %-100 %] 100 % (12/04 0813) Arterial Line BP: (79-132)/(37-67) 90/66 (12/03 1731) Weight:  [143 lb (64.9 kg)-153 lb 14.1 oz (69.8 kg)] 153 lb 14.1 oz (69.8 kg) (12/03 1828)  Hemodynamic parameters for last 24 hours:    Intake/Output from previous day: 12/03 0701 - 12/04 0700 In: 3343.3 [P.O.:340; I.V.:2153.3; IV Piggyback:50] Out: 1480 [Urine:500; Chest Tube:480] Intake/Output this shift: No intake/output data recorded.  General appearance: alert, cooperative and no distress Heart: regular rate and rhythm and + rub with tubes in place Lungs: mildly dim in left base Abdomen: benign Extremities: no edema Wound: dressings cdi  Lab Results: Recent Labs    09/12/17 0230  WBC 9.2  HGB 8.6*  HCT 28.8*  PLT 179   BMET:  Recent Labs    09/12/17 0230  NA 133*  K 5.0  CL 95*  CO2 30  GLUCOSE 162*  BUN 31*  CREATININE 1.56*  CALCIUM 8.5*    PT/INR: No results for input(s): LABPROT, INR in the last 72 hours. ABG    Component Value Date/Time   PHART 7.389 09/12/2017 0310   HCO3 30.3 (H) 09/12/2017 0310   TCO2 27 03/19/2010 0726   O2SAT 94.8 09/12/2017 0310   CBG (last 3)  No results for input(s): GLUCAP in the last 72 hours.  Meds Scheduled Meds: . acetaminophen  1,000 mg Oral Q6H   Or  . acetaminophen (TYLENOL) oral liquid 160 mg/5 mL  1,000 mg Oral Q6H  . amLODipine  5 mg Oral Daily  . aspirin EC  81 mg Oral Daily  . atorvastatin   80 mg Oral Daily  . bisacodyl  10 mg Oral Daily  . budesonide  0.5 mg Nebulization BID  . cholecalciferol  1,000 Units Oral Daily  . feeding supplement  1 Container Oral Daily  . ferrous sulfate  325 mg Oral Q breakfast  . ipratropium-albuterol  3 mL Nebulization BID  . levothyroxine  125 mcg Oral QAC breakfast  . metoprolol succinate  50 mg Oral BID  . multivitamin  1 tablet Oral BID  . naproxen  250 mg Oral BID WC  . pantoprazole  40 mg Oral Daily  . predniSONE  10 mg Oral Q breakfast  . senna-docusate  1 tablet Oral QHS   Continuous Infusions: . dextrose 5 % and 0.45% NaCl 100 mL/hr at 09/12/17 0301  . lactated ringers Stopped (10/01/2017 1215)  . potassium chloride     PRN Meds:.fentaNYL (SUBLIMAZE) injection, lidocaine-prilocaine, ondansetron (ZOFRAN) IV, oxyCODONE, potassium chloride, traMADol  Xrays Dg Chest 2 View  Result Date: 10/02/2017 CLINICAL DATA:  Examination. History of pericardial effusion required pericardiocentesis in October 2018. History of COPD with shortness of breath, coronary artery disease, hypertension EXAM: CHEST  2 VIEW COMPARISON:  Chest x-ray of August 25, 2017 and chest CT scan of 22 August 2017. FINDINGS: The lungs are well-expanded. There is increased  airspace opacity on the left as compared to the previous study. There may be increased pleural fluid on the left along the upper lateral pleural surface. The cardiac silhouette remains enlarged. The pulmonary vascularity is not clearly engorged. There is calcification in the wall of the aortic arch. The power port catheter tip projects over the midportion of the SVC. IMPRESSION: Stable cardiomegaly without pulmonary vascular congestion. Increased opacity in the left perihilar region is worrisome for pneumonia with parapneumonic effusion. Persistent increased interstitial markings in both lungs consistent with known pulmonary fibrosis, history of COPD, and smoking. There is known mediastinal lymphadenopathy  which is not clearly evident on this study. A follow-up chest CT scan for direct comparison with the scan of 13 November would be useful. Thoracic aortic atherosclerosis. Electronically Signed   By: David  Martinique M.D.   On: 10/06/2017 08:42   Dg Chest Port 1 View  Result Date: 09/27/2017 CLINICAL DATA:  Pericardial effusion with chest tube placement EXAM: PORTABLE CHEST 1 VIEW COMPARISON:  September 11, 2017 chest radiograph and chest CT August 22, 2017 FINDINGS: There is a chest tube on the left. There is a small pneumothorax on the left laterally and inferiorly without tension component. There is consolidation in the left upper lobe compared to earlier in the day. The cardiac silhouette appears smaller. There is patchy consolidation in the left mid lower lung zones. There is underlying parenchymal fibrotic change diffusely. Heart remains mildly prominent on the right with pulmonary vascularity within normal limits. There is aortic atherosclerosis. Port-A-Cath tip is in the superior vena cava. No adenopathy. IMPRESSION: Chest tube now present on the left with small pneumothorax on the left. No tension component. Heart appears mildly smaller with less consolidation in the left mid and lower lung zones. There is underlying parenchymal lung fibrosis. Port-A-Cath tip in superior vena cava. Heart remains prominent with pulmonary vascularity within normal limits. There is aortic atherosclerosis. Aortic Atherosclerosis (ICD10-I70.0) and Emphysema (ICD10-J43.9). Electronically Signed   By: Lowella Grip III M.D.   On: 09/19/2017 13:56    Assessment/Plan: S/P Procedure(s) (LRB): SUBXYPHOID PERICARDIAL WINDOW (N/A) CHEST TUBE INSERTION (Left)   1 both tubes with mod drainage- cont for now, place on H2O seal 2 hemodyn stable in sinus - monitor on current meds 3 creat up a bit, monitor, cont IVF till we see how she does with diet 4 enc pulm toilet, wean O2 as able PCO2 elevated a bit- cont nebs as well,  mobilize as able today 5 d/c foley 6 blood sugars adeq control- we are increasing prednisone so watch carefully 7 lovenox for DVT proph   LOS: 1 day    John Giovanni 09/12/2017

## 2017-09-12 NOTE — Progress Notes (Signed)
Pt voided 150cc. Post void bladder scan stated that too low volume detected in bladder to count. Will continue to monitor.

## 2017-09-12 NOTE — Plan of Care (Signed)
Continue current care plan 

## 2017-09-12 NOTE — Progress Notes (Signed)
Pt experienced a episode of SOB after getting back to bed and had audible fluid shifting near her chest tube sites. Pt sating 100% on 2L and SWOT nurse came to assess chest tubes, along with consulting with rapid response nurse. Chest tubes continue to rub near pericardial site sounding similar to a squeaky toy. Dr. Roxy Manns was made aware and no additional orders at this time, will continue to monitor patient.

## 2017-09-13 ENCOUNTER — Inpatient Hospital Stay (HOSPITAL_COMMUNITY): Payer: PPO

## 2017-09-13 LAB — ACID FAST SMEAR (AFB): ACID FAST SMEAR - AFSCU2: NEGATIVE

## 2017-09-13 LAB — COMPREHENSIVE METABOLIC PANEL
ALT: 26 U/L (ref 14–54)
ANION GAP: 9 (ref 5–15)
AST: 32 U/L (ref 15–41)
Albumin: 2.2 g/dL — ABNORMAL LOW (ref 3.5–5.0)
Alkaline Phosphatase: 60 U/L (ref 38–126)
BILIRUBIN TOTAL: 0.8 mg/dL (ref 0.3–1.2)
BUN: 42 mg/dL — AB (ref 6–20)
CHLORIDE: 89 mmol/L — AB (ref 101–111)
CO2: 29 mmol/L (ref 22–32)
Calcium: 8.4 mg/dL — ABNORMAL LOW (ref 8.9–10.3)
Creatinine, Ser: 2.04 mg/dL — ABNORMAL HIGH (ref 0.44–1.00)
GFR, EST AFRICAN AMERICAN: 26 mL/min — AB (ref >60)
GFR, EST NON AFRICAN AMERICAN: 22 mL/min — AB (ref >60)
Glucose, Bld: 150 mg/dL — ABNORMAL HIGH (ref 65–99)
POTASSIUM: 5.3 mmol/L — AB (ref 3.5–5.1)
Sodium: 127 mmol/L — ABNORMAL LOW (ref 135–145)
TOTAL PROTEIN: 5.6 g/dL — AB (ref 6.5–8.1)

## 2017-09-13 LAB — GLUCOSE, BODY FLUID OTHER: Glucose, Body Fluid Other: 97 mg/dL

## 2017-09-13 LAB — CBC
HEMATOCRIT: 29.1 % — AB (ref 36.0–46.0)
Hemoglobin: 8.8 g/dL — ABNORMAL LOW (ref 12.0–15.0)
MCH: 30.3 pg (ref 26.0–34.0)
MCHC: 30.2 g/dL (ref 30.0–36.0)
MCV: 100.3 fL — AB (ref 78.0–100.0)
Platelets: 191 10*3/uL (ref 150–400)
RBC: 2.9 MIL/uL — ABNORMAL LOW (ref 3.87–5.11)
RDW: 17.1 % — AB (ref 11.5–15.5)
WBC: 10.3 10*3/uL (ref 4.0–10.5)

## 2017-09-13 LAB — ACID FAST SMEAR (AFB, MYCOBACTERIA): Acid Fast Smear: NEGATIVE

## 2017-09-13 MED ORDER — SODIUM CHLORIDE 0.9 % IV SOLN
INTRAVENOUS | Status: DC
  Administered 2017-09-13 – 2017-09-22 (×7): via INTRAVENOUS

## 2017-09-13 NOTE — Evaluation (Signed)
Occupational Therapy Evaluation Patient Details Name: Renee Vance MRN: [id number] DOB: November 16, 1940 Today's Date: 09/13/2017    History of Present Illness this 76 y.o. female admitted for pericardial window due to pericardial effusion.   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 She demonstrates the below listed deficits and will benefit from continued OT to maximize safety and independence with BADLs.  Pt presents with generalized weakness, decreased activity tolerance, decreased balance, and mild pain.  She requires min - max A for ADLs and min a for functional transfers.  She fatigues quite rapidly with minimal activity - DOE 3/4 (unable to obtain accurate pulse ox reading).   Pt on 4L supplemental 02.  Pt lived with spouse at home and was mod I with ADLs, required intermittent assist with IADLs.  Spouse is supportive and able to provide assist as needed.         Follow Up Recommendations  Home health OT    Equipment Recommendations  3 in 1 bedside commode    Recommendations for Other Services       Precautions / Restrictions Precautions Precautions: Fall      Mobility Bed Mobility Overal bed mobility: Needs Assistance Bed Mobility: Supine to Sit     Supine to sit: Min assist;HOB elevated     General bed mobility comments: assist to lift trunk   Transfers Overall transfer level: Needs assistance Equipment used: 1 person hand held assist Transfers: Sit to/from Stand;Stand Pivot Transfers Sit to Stand: Min assist Stand pivot transfers: Min assist       General transfer comment: min A to steady     Balance Overall balance assessment: Needs assistance Sitting-balance support: Feet supported Sitting balance-Leahy Scale: Good     Standing balance support: Single extremity supported Standing balance-Leahy Scale:  Poor Standing balance comment: requires UE support                            ADL either performed or assessed with clinical judgement   ADL Overall ADL's : Needs assistance/impaired Eating/Feeding: Independent   Grooming: Wash/dry hands;Wash/dry face;Oral care;Brushing hair;Set up;Sitting   Upper Body Bathing: Minimal assistance;Sitting   Lower Body Bathing: Moderate assistance;Sit to/from stand   Upper Body Dressing : Moderate assistance;Sitting   Lower Body Dressing: Total assistance;Sit to/from stand   Toilet Transfer: Minimal assistance;Stand-pivot;BSC   Toileting- Clothing Manipulation and Hygiene: Minimal assistance;Sit to/from stand       Functional mobility during ADLs: Minimal assistance General ADL Comments: Pt limited by DOE and nausea      Vision         Perception     Praxis      Pertinent Vitals/Pain Pain Assessment: Faces Faces Pain Scale: Hurts a little bit Pain Location: Rt chest  Pain Descriptors / Indicators: Aching Pain Intervention(s): Monitored during session     Hand Dominance Right   Extremity/Trunk Assessment Upper Extremity Assessment Upper Extremity Assessment: Generalized weakness   Lower Extremity Assessment Lower Extremity Assessment: Defer to PT evaluation   Cervical / Trunk Assessment Cervical / Trunk Assessment: Kyphotic   Communication Communication Communication: No difficulties   Cognition Arousal/Alertness: Awake/alert Behavior During Therapy: WFL for tasks assessed/performed Overall Cognitive Status: Within Functional Limits for tasks assessed  General Comments  HR 69; DOE 3/4 with activity.  Unable to get accurate pulse ox reading during activity     Exercises     Shoulder Instructions      Home Living Family/patient expects to be discharged to:: Private residence Living Arrangements: Spouse/significant other Available Help at Discharge:  Family;Available 24 hours/day Type of Home: House Home Access: Stairs to enter [employer] of Steps: 1 Entrance Stairs-Rails: None Home Layout: Multi-level;Bed/bath upstairs Alternate Level Stairs-Number of Steps: 5 Alternate Level Stairs-Rails: Right Bathroom Shower/Tub: Teacher, early years/pre: Standard     Home Equipment: Shower seat   Additional Comments: Pt uses 4L supplemental 02 at home       Prior Functioning/Environment Level of Independence: Needs assistance  Gait / Transfers Assistance Needed: ambulates without AD ADL's / Homemaking Assistance Needed: Pt reports she is independent with ADLs, she does IADLs, but takes on one task a day, and takes frequent rest breaks.  She now longer drives - spouse provides transportation             OT Problem List: Decreased strength;Decreased activity tolerance;Impaired balance (sitting and/or standing);Decreased knowledge of use of DME or AE;Cardiopulmonary status limiting activity;Pain      OT Treatment/Interventions: Self-care/ADL training;DME and/or AE instruction;Therapeutic activities;Patient/family education;Balance training;Energy conservation    OT Goals(Current goals can be found in the care plan section) Acute Rehab OT Goals Patient Stated Goal: to get better?  OT Goal Formulation: With patient Time For Goal Achievement: 09/27/17 Potential to Achieve Goals: Good ADL Goals Pt Will Perform Grooming: with min guard assist;standing Pt Will Perform Upper Body Bathing: with set-up;sitting Pt Will Perform Lower Body Bathing: with min guard assist;sit to/from stand Pt Will Perform Upper Body Dressing: with set-up;sitting Pt Will Perform Lower Body Dressing: with min guard assist;sit to/from stand Pt Will Transfer to Toilet: with min guard assist;ambulating;regular height toilet;grab bars;bedside commode Pt Will Perform Toileting - Clothing Manipulation and hygiene: with min guard assist Pt Will  Perform Tub/Shower Transfer: Tub transfer;with min guard assist;ambulating;shower seat;rolling walker Additional ADL Goal #1: Pt incorporate energy conservation techniques into ADLs independently.  OT Frequency: Min 2X/week   Barriers to D/C:            Co-evaluation              AM-PAC PT "6 Clicks" Daily Activity     Outcome Measure Help from another person eating meals?: None Help from another person taking care of personal grooming?: A Little Help from another person toileting, which includes using toliet, bedpan, or urinal?: A Little Help from another person bathing (including washing, rinsing, drying)?: A Lot Help from another person to put on and taking off regular upper body clothing?: A Lot Help from another person to put on and taking off regular lower body clothing?: Total 6 Click Score: 15   End of Session Equipment Utilized During Treatment: Oxygen Nurse Communication: Mobility status  Activity Tolerance: Patient limited by fatigue;Treatment limited secondary to medical complications (Comment)(N/V ) Patient left: in chair;with call bell/phone within reach  OT Visit Diagnosis: Unsteadiness on feet (R26.81)                Time: 7124-5809 OT Time Calculation (min): 29 min Charges:  OT General Charges $OT Visit: 1 Visit OT Evaluation $OT Eval Moderate Complexity: 1 Mod OT Treatments $Self Care/Home Management : 8-22 mins G-Codes:     [facility], OTR/L [phone]   Tarri Guilfoil M 09/13/2017, 1:04 PM

## 2017-09-13 NOTE — Progress Notes (Signed)
Pt. Has been feeling nausea and vomited twice today. She did not feel up for walking today but did sit in the chair for several hours.

## 2017-09-13 NOTE — Progress Notes (Signed)
2 Days Post-Op Procedure(s) (LRB): SUBXYPHOID PERICARDIAL WINDOW (N/A) CHEST TUBE INSERTION (Left) Subjective: C/o nausea  Objective: Vital signs in last 24 hours: Temp:  [97.6 F (36.4 C)-98.2 F (36.8 C)] 97.7 F (36.5 C) (12/05 0410) Pulse Rate:  [57-61] 58 (12/05 0410) Cardiac Rhythm: Sinus bradycardia (12/05 0705) Resp:  [11-27] 11 (12/05 0410) BP: (91-117)/(56-61) 117/61 (12/05 0410) SpO2:  [99 %-100 %] 99 % (12/05 0410)  Hemodynamic parameters for last 24 hours:    Intake/Output from previous day: 12/04 0701 - 12/05 0700 In: 2683.3 [P.O.:240; I.V.:2443.3] Out: 1190 [Urine:600; Chest Tube:590] Intake/Output this shift: Total I/O In: -  Out: 100 [Urine:100]  General appearance: alert, cooperative and no distress Neurologic: intact Heart: regular rate and rhythm and friction rub heard - Lungs: clear to auscultation bilaterally Abdomen: normal findings: soft, non-tender faintly blood tinged fluid from drains + pleural rub as well  Lab Results: Recent Labs    09/12/17 0230 09/13/17 0225  WBC 9.2 10.3  HGB 8.6* 8.8*  HCT 28.8* 29.1*  PLT 179 191   BMET:  Recent Labs    09/12/17 0230 09/13/17 0225  NA 133* 127*  K 5.0 5.3*  CL 95* 89*  CO2 30 29  GLUCOSE 162* 150*  BUN 31* 42*  CREATININE 1.56* 2.04*  CALCIUM 8.5* 8.4*    PT/INR: No results for input(s): LABPROT, INR in the last 72 hours. ABG    Component Value Date/Time   PHART 7.389 09/12/2017 0310   HCO3 30.3 (H) 09/12/2017 0310   TCO2 27 03/19/2010 0726   O2SAT 94.8 09/12/2017 0310   CBG (last 3)  No results for input(s): GLUCAP in the last 72 hours.  Assessment/Plan: S/P Procedure(s) (LRB): SUBXYPHOID PERICARDIAL WINDOW (N/A) CHEST TUBE INSERTION (Left) -POD # 2 Creatinine up to 2- will dc naprosyn and use prednisone alone for pericarditis Continue IVF today- change to NS due to hyponatremia Keep both tubes in place until drainage down Continue ambulation SCD + enoxaparin   LOS: 2 days    Melrose Nakayama 09/13/2017

## 2017-09-13 NOTE — Progress Notes (Signed)
Small hematoma on patient's left AC. MD notified.

## 2017-09-14 ENCOUNTER — Inpatient Hospital Stay (HOSPITAL_COMMUNITY): Payer: PPO

## 2017-09-14 LAB — CBC
HEMATOCRIT: 28.8 % — AB (ref 36.0–46.0)
HEMOGLOBIN: 8.9 g/dL — AB (ref 12.0–15.0)
MCH: 30.5 pg (ref 26.0–34.0)
MCHC: 30.9 g/dL (ref 30.0–36.0)
MCV: 98.6 fL (ref 78.0–100.0)
Platelets: 175 10*3/uL (ref 150–400)
RBC: 2.92 MIL/uL — ABNORMAL LOW (ref 3.87–5.11)
RDW: 16.7 % — ABNORMAL HIGH (ref 11.5–15.5)
WBC: 8.4 10*3/uL (ref 4.0–10.5)

## 2017-09-14 LAB — BASIC METABOLIC PANEL
ANION GAP: 9 (ref 5–15)
BUN: 40 mg/dL — ABNORMAL HIGH (ref 6–20)
CALCIUM: 8.3 mg/dL — AB (ref 8.9–10.3)
CHLORIDE: 91 mmol/L — AB (ref 101–111)
CO2: 25 mmol/L (ref 22–32)
Creatinine, Ser: 1.69 mg/dL — ABNORMAL HIGH (ref 0.44–1.00)
GFR calc non Af Amer: 28 mL/min — ABNORMAL LOW (ref >60)
GFR, EST AFRICAN AMERICAN: 32 mL/min — AB (ref >60)
Glucose, Bld: 107 mg/dL — ABNORMAL HIGH (ref 65–99)
POTASSIUM: 5 mmol/L (ref 3.5–5.1)
Sodium: 125 mmol/L — ABNORMAL LOW (ref 135–145)

## 2017-09-14 MED ORDER — MAGNESIUM HYDROXIDE 400 MG/5ML PO SUSP
30.0000 mL | Freq: Every day | ORAL | Status: DC | PRN
  Administered 2017-09-14 – 2017-09-18 (×3): 30 mL via ORAL
  Filled 2017-09-14 (×4): qty 30

## 2017-09-14 MED ORDER — SODIUM CHLORIDE 0.9% FLUSH: 10.0000 mL | INTRAVENOUS | Status: DC | PRN

## 2017-09-14 NOTE — Care Management Note (Signed)
Case Management Note  Patient Details  Name: Renee Vance MRN: [id number] Date of Birth: 22-Apr-1941  Subjective/Objective:  Pt is s/p pericardial window - lung CA                  Action/Plan:   PTA independent from home with spouse.  Pt has PCP and prescription coverage.  Pt is now recommended for HH/DME - CM left agency choice list with pt and will follow back up with pt.    Expected Discharge Date:                  Expected Discharge Plan:  [place]  In-House Referral:     Discharge planning Services  CM Consult  Post Acute Care Choice:    Choice offered to:     DME Arranged:    DME Agency:     HH Arranged:    HH Agency:     Status of Service:     If discussed at [employer], dates discussed:    Additional Comments:  Maryclare Labrador, RN 09/14/2017, 4:01 PM

## 2017-09-14 NOTE — Evaluation (Signed)
Physical Therapy Evaluation Patient Details Name: Renee Vance MRN: [id number] DOB: 1941/08/11 Today's Date: 09/14/2017   History of Present Illness  Pt is a 76 y.o. female admitted on 09/19/2017 for pericardial window due to pericardial effusion with chest tube placement. 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    Clinical Impression  Pt presents with decreased activity tolerance and an overall decrease in functional mobility secondary to above. PTA, pt indep with mobility, but is limited by DOE; lives with husband who will be available for 24/7 support. Today, pt able to amb 73' with RW and intermittent min guard for balance; further distance limited by DOE 3/4 with amb and nausea. Discussed recommendation for HHPT and potential for Phase II Cardiac Rehab (handout for CR provided). Pt would benefit from continued acute PT services to maximize functional mobility and independence prior to d/c with HHPT.     Follow Up Recommendations Home health PT;Supervision for mobility/OOB    Equipment Recommendations  Rolling walker with 5" wheels    Recommendations for Other Services       Precautions / Restrictions Precautions Precautions: Fall Precaution Comments: Chest tubes Restrictions Weight Bearing Restrictions: No      Mobility  Bed Mobility Overal bed mobility: Needs Assistance             General bed mobility comments: Received sitting in chair  Transfers Overall transfer level: Needs assistance Equipment used: Rolling walker (2 wheeled) Transfers: Sit to/from Stand Sit to Stand: Min guard         General transfer comment: Assist for lines/leads, but no physical assist required  Ambulation/Gait Ambulation/Gait assistance: Min guard Ambulation Distance (Feet): 50 Feet Assistive device: Rolling walker (2 wheeled) Gait Pattern/deviations: Step-through  pattern;Decreased stride length;Trunk flexed Gait velocity: Decreased Gait velocity interpretation: <1.8 ft/sec, indicative of risk for recurrent falls General Gait Details: Slow, steady amb with RW and min guard for balance. Pt required 3x standing rest break, leaning on hand rail secondary to dyspnea 3/4 on 3L O2 [place]; increased time and effort secondary to DOE, but no physical assist required  Stairs            Wheelchair Mobility    Modified Rankin (Stroke Patients Only)       Balance Overall balance assessment: Needs assistance Sitting-balance support: Feet supported Sitting balance-Leahy Scale: Good       Standing balance-Leahy Scale: Poor Standing balance comment: requires UE support                              Pertinent Vitals/Pain Pain Assessment: Faces Faces Pain Scale: Hurts a little bit Pain Location: Chest tube insertion site (R) Pain Descriptors / Indicators: Sore Pain Intervention(s): Monitored during session    Home Living Family/patient expects to be discharged to:: Private residence Living Arrangements: Spouse/significant other Available Help at Discharge: Family;Available 24 hours/day Type of Home: House Home Access: Stairs to enter Entrance Stairs-Rails: None Entrance Stairs-Number of Steps: 1 Home Layout: Multi-level;Bed/bath upstairs Home Equipment: Shower seat Additional Comments: Pt uses 4L supplemental 02 at home     Prior Function Level of Independence: Needs assistance   Gait / Transfers Assistance Needed: Amb without AD; distance limited, requiring rest breaks secondary to SOB  ADL's / Homemaking Assistance Needed: Pt reports she is independent with ADLs, she does IADLs, but takes on one task a day, and takes  frequent rest breaks.  She now longer drives - spouse provides transportation         Hand Dominance   Dominant Hand: Right    Extremity/Trunk Assessment   Upper Extremity Assessment Upper Extremity  Assessment: Generalized weakness    Lower Extremity Assessment Lower Extremity Assessment: Generalized weakness    Cervical / Trunk Assessment Cervical / Trunk Assessment: Kyphotic  Communication   Communication: No difficulties  Cognition Arousal/Alertness: Awake/alert Behavior During Therapy: WFL for tasks assessed/performed Overall Cognitive Status: Within Functional Limits for tasks assessed                                        General Comments General comments (skin integrity, edema, etc.): DOE 3/4 with 3L O2 [place]; unable to get accurate reading with pulse ox when amb    Exercises     Assessment/Plan    PT Assessment Patient needs continued PT services  PT Problem List Decreased strength;Decreased activity tolerance;Decreased balance;Decreased mobility;Decreased knowledge of use of DME;Cardiopulmonary status limiting activity       PT Treatment Interventions DME instruction;Gait training;Stair training;Functional mobility training;Therapeutic activities;Therapeutic exercise;Balance training;Patient/family education    PT Goals (Current goals can be found in the Care Plan section)  Acute Rehab PT Goals Patient Stated Goal: Return home PT Goal Formulation: With patient Time For Goal Achievement: 09/28/17 Potential to Achieve Goals: Good    Frequency Min 3X/week   Barriers to discharge        Co-evaluation               AM-PAC PT "6 Clicks" Daily Activity  Outcome Measure Difficulty turning over in bed (including adjusting bedclothes, sheets and blankets)?: A Little Difficulty moving from lying on back to sitting on the side of the bed? : A Little Difficulty sitting down on and standing up from a chair with arms (e.g., wheelchair, bedside commode, etc,.)?: A Little Help needed moving to and from a bed to chair (including a wheelchair)?: A Little Help needed walking in hospital room?: A Little Help needed climbing 3-5 steps with a railing?  : A Little 6 Click Score: 18    End of Session Equipment Utilized During Treatment: Gait belt;Oxygen Activity Tolerance: Patient tolerated treatment well;Patient limited by fatigue Patient left: in chair;with call bell/phone within reach;with family/visitor present Nurse Communication: Mobility status PT Visit Diagnosis: Other abnormalities of gait and mobility (R26.89);Muscle weakness (generalized) (M62.81)    Time: 7628-3151 PT Time Calculation (min) (ACUTE ONLY): 26 min   Charges:   PT Evaluation $PT Eval Moderate Complexity: 1 Mod PT Treatments $Gait Training: 8-22 mins   PT G Codes:       Mabeline Caras, PT, DPT Acute Rehab Services  Pager: [place] 09/14/2017, 3:09 PM

## 2017-09-14 NOTE — Progress Notes (Addendum)
[address]       [place] [address]             [phone]      3 Days Post-Op Procedure(s) (LRB): SUBXYPHOID PERICARDIAL WINDOW (N/A) CHEST TUBE INSERTION (Left) Subjective: Having nausea and vomiting, No BM + flatus, no abdominal pain  Objective: Vital signs in last 24 hours: Temp:  [97.4 F (36.3 C)-98.3 F (36.8 C)] 98.3 F (36.8 C) (12/06 0457) Pulse Rate:  [57-69] 67 (12/06 0712) Cardiac Rhythm: Normal sinus rhythm (12/06 0700) Resp:  [17-22] 19 (12/06 0712) BP: (109-133)/(64-70) 133/70 (12/06 0457) SpO2:  [98 %-100 %] 99 % (12/06 0712)  Hemodynamic parameters for last 24 hours:    Intake/Output from previous day: 12/05 0701 - 12/06 0700 In: 1350 [I.V.:1350] Out: 940 [Urine:550; Chest Tube:390] Intake/Output this shift: No intake/output data recorded.  General appearance: alert, cooperative and no distress Heart: regular rate and rhythm Lungs: coarse BS with crackles and inspiratory pseudo-wheeze Abdomen: nontender,non-distended, + BS Extremities: no calf pain or edema Wound: incis healing well  Lab Results: Recent Labs    09/13/17 0225 09/14/17 0330  WBC 10.3 8.4  HGB 8.8* 8.9*  HCT 29.1* 28.8*  PLT 191 175   BMET:  Recent Labs    09/13/17 0225 09/14/17 0330  NA 127* 125*  K 5.3* 5.0  CL 89* 91*  CO2 29 25  GLUCOSE 150* 107*  BUN 42* 40*  CREATININE 2.04* 1.69*  CALCIUM 8.4* 8.3*    PT/INR: No results for input(s): LABPROT, INR in the last 72 hours. ABG    Component Value Date/Time   PHART 7.389 09/12/2017 0310   HCO3 30.3 (H) 09/12/2017 0310   TCO2 27 03/19/2010 0726   O2SAT 94.8 09/12/2017 0310   CBG (last 3)  No results for input(s): GLUCAP in the last 72 hours.  Meds Scheduled Meds: . acetaminophen  1,000 mg Oral Q6H   Or  . acetaminophen (TYLENOL) oral liquid 160 mg/5 mL  1,000 mg Oral Q6H  . amLODipine  5 mg Oral Daily  . aspirin EC  81 mg Oral Daily  . atorvastatin  80 mg Oral Daily  .  bisacodyl  10 mg Oral Daily  . budesonide  0.5 mg Nebulization BID  . cholecalciferol  1,000 Units Oral Daily  . enoxaparin (LOVENOX) injection  30 mg Subcutaneous Q24H  . ferrous sulfate  325 mg Oral Q breakfast  . ipratropium-albuterol  3 mL Nebulization BID  . levothyroxine  125 mcg Oral QAC breakfast  . metoprolol succinate  50 mg Oral BID  . multivitamin  1 tablet Oral BID  . pantoprazole  40 mg Oral Daily  . predniSONE  20 mg Oral Q breakfast  . protein supplement shake  2 oz Oral Daily  . senna-docusate  1 tablet Oral QHS   Continuous Infusions: . sodium chloride 75 mL/hr at 09/13/17 2232  . lactated ringers Stopped (09/22/2017 1215)  . potassium chloride     PRN Meds:.fentaNYL (SUBLIMAZE) injection, lidocaine-prilocaine, ondansetron (ZOFRAN) IV, oxyCODONE, potassium chloride, traMADol  Xrays Dg Chest Port 1 View  Result Date: 09/13/2017 CLINICAL DATA:  Follow-up chest tube placement EXAM: PORTABLE CHEST 1 VIEW COMPARISON:  09/16/2017 FINDINGS: Cardiac shadow remains enlarged. Left chest tube and pericardial drain are again identified and stable. The known left pneumothorax has decreased somewhat in the interval from the prior exam. Patchy infiltrate is noted throughout the midportion of the left lung stable from the prior study.  Diffuse chronic fibrotic changes are again noted as well. Right chest wall port is stable. IMPRESSION: Tubes and lines as described. Persistent left mid lung consolidation is noted. Improving left pneumothorax. Electronically Signed   By: Inez Catalina M.D.   On: 09/13/2017 09:09   Results for orders placed or performed during the hospital encounter of 10/02/2017  Acid Fast Smear (AFB)     Status: None   Collection Time: 10/01/2017 12:02 PM  Result Value Ref Range Status   AFB Specimen Processing Concentration  Final   Acid Fast Smear Negative  Final    Comment: (NOTE) Performed At: [facility] [address] [place], [place] [id number] Rush Farmer MD JK:[id number]    Source (AFB) FLUID  Final    Comment: PERICARDIAL  Culture, body fluid-bottle     Status: None (Preliminary result)   Collection Time: 09/10/2017 12:02 PM  Result Value Ref Range Status   Specimen Description FLUID PERICARDIAL  Final   Special Requests NONE  Final   Culture NO GROWTH 2 DAYS  Final   Report Status PENDING  Incomplete  Gram stain     Status: None   Collection Time: 09/21/2017 12:02 PM  Result Value Ref Range Status   Specimen Description FLUID PERICARDIAL  Final   Special Requests NONE  Final   Gram Stain NO WBC SEEN NO ORGANISMS SEEN   Final   Report Status 09/12/2017 FINAL  Final  Aerobic/Anaerobic Culture (surgical/deep wound)     Status: None (Preliminary result)   Collection Time: 10/09/2017 12:05 PM  Result Value Ref Range Status   Specimen Description TISSUE  Final   Special Requests PERICARDIUM  Final   Gram Stain   Final    RARE WBC PRESENT, PREDOMINANTLY PMN NO ORGANISMS SEEN    Culture   Final    NO GROWTH 2 DAYS NO ANAEROBES ISOLATED; CULTURE IN PROGRESS FOR 5 DAYS   Report Status PENDING  Incomplete  Acid Fast Smear (AFB)     Status: None   Collection Time: 10/09/2017 12:05 PM  Result Value Ref Range Status   AFB Specimen Processing Comment  Final    Comment: Tissue Grinding and Digestion/Decontamination   Acid Fast Smear Negative  Final    Comment: (NOTE) Performed At: [facility] [place], [place] [id number] Rush Farmer MD YB:[id number]    Source (AFB) TISSUE  Final    Comment: PERICARDIUM  Virus culture     Status: None   Collection Time: 09/20/2017 12:05 PM  Result Value Ref Range Status   Viral Culture Comment  Final    Comment: (NOTE) Preliminary Report: No virus isolated at 24 hours. Next report to follow after 4 days. Performed At: [facility] [place], [place] [id number] Rush Farmer MD TI:[id number]    Source of Sample TISSUE  Final    Comment: PERICARDIUM    Chest tubes with serosang drainage- 420 cc yesterday and 120 cc so far today  Path: pending  Assessment/Plan: S/P Procedure(s) (LRB): SUBXYPHOID PERICARDIAL WINDOW (N/A) CHEST TUBE INSERTION (Left)  1 hemodyn stable in sinus rhythm 2 nausea and vomiting, some constipation- add laxative 3 cont saline with hyponatremia and hypochloremia- she is not metabolically alkalotic currently- monitor. Renal fxn is improved , now off NSAIDS 4 cont prednisone 5 cont pulm toilet /nebs 6 H/H stable, no leukocytosis or fevers 7 poss d/c mediastinal tube soon- difficult to determine drainage but doesn't appear to have a lot in system 8 cultures  unremarkable so far 9 path pending  LOS: 3 days    Renee Vance 09/14/2017 Patient seen and examined, agree with above Put out 60 from pericardial drain yesterday, 400 ml total since OR, keep tube for now Acute kidney injury due to NSAID- improving Chronic respiratory failure on home O2 4L prior to admission  Tiyana Galla C. Roxan Hockey, MD [facility] [phone]

## 2017-09-15 LAB — BASIC METABOLIC PANEL
ANION GAP: 7 (ref 5–15)
BUN: 35 mg/dL — ABNORMAL HIGH (ref 6–20)
CALCIUM: 8.2 mg/dL — AB (ref 8.9–10.3)
CO2: 26 mmol/L (ref 22–32)
Chloride: 95 mmol/L — ABNORMAL LOW (ref 101–111)
Creatinine, Ser: 1.62 mg/dL — ABNORMAL HIGH (ref 0.44–1.00)
GFR calc Af Amer: 34 mL/min — ABNORMAL LOW (ref >60)
GFR, EST NON AFRICAN AMERICAN: 29 mL/min — AB (ref >60)
GLUCOSE: 125 mg/dL — AB (ref 65–99)
Potassium: 4.9 mmol/L (ref 3.5–5.1)
SODIUM: 128 mmol/L — AB (ref 135–145)

## 2017-09-15 MED ORDER — ACETAMINOPHEN 160 MG/5ML PO SOLN: 1000.0000 mg | Freq: Four times a day (QID) | ORAL | Status: DC | PRN

## 2017-09-15 MED ORDER — ACETAMINOPHEN 500 MG PO TABS: 1000.0000 mg | ORAL_TABLET | Freq: Four times a day (QID) | ORAL | Status: DC | PRN

## 2017-09-15 MED ORDER — PREDNISONE 20 MG PO TABS
40.0000 mg | ORAL_TABLET | Freq: Every day | ORAL | Status: DC
  Administered 2017-09-15 – 2017-09-17 (×3): 40 mg via ORAL
  Filled 2017-09-15 (×4): qty 2

## 2017-09-15 NOTE — Consult Note (Signed)
   [facility] CM Inpatient Consult   09/29/2017  MARVELYN BOUCHILLON 01-24-1941 [id number]   Patient evaluated for community based chronic disease management services with [facility] as a benefit of patient's [employer] plan Spoke with patient and husband, Gwyndolyn Saxon at bedside to explain [facility].  Consent form signed and copies of information was given.  Mr. Gwyndolyn Saxon states that they have concerns for the nebulizer medications cost and possibly any anti-coagulants cost except for Coumadin.  She had been started on Eliquis and it was costly.  They are also concerned about financial assistance. Patient will be assigned to [facility] staff for post hospital transition needs,  They use Product/process development scientist.  They endorse Dr. Assunta Found as the primary care provider.  Patient will receive post hospital discharge call and will be evaluated for monthly home visits for assessments and disease process education.  Left contact information and [facility] literature at bedside. Patient state they will have home health with [facility].  Of note, [facility] services does not replace or interfere with any services that are arranged by inpatient case management or social work.  For additional questions or referrals please contact:    Natividad Brood, RN BSN [facility] Liaison  [phone] business mobile phone Toll free office [phone]

## 2017-09-15 NOTE — Care Management Note (Signed)
Case Management Note  Patient Details  Name: Renee Vance MRN: [id number] Date of Birth: 1941/06/27  Subjective/Objective:       Pt is s/p pericardial window - CT insertion             Action/Plan:   PTA independent from home with husband.  CM offered choice for [facility] and DME as recommended - pt chose [facility] - agency contacted and referral accepted   Expected Discharge Date:                  Expected Discharge Plan:  [place]  In-House Referral:     Discharge planning Services  CM Consult  Post Acute Care Choice:  Durable Medical Equipment Choice offered to:  Patient  DME Arranged:  3-N-1, Walker rolling DME Agency:  [place]:  PT, OT [facility] Agency:  [place]  Status of Service:  In process, will continue to follow  If discussed at Long Length of Stay Meetings, dates discussed:    Additional Comments:  Maryclare Labrador, RN 10/06/2017, 2:33 PM

## 2017-09-15 NOTE — Progress Notes (Addendum)
[place] [address]       [place] [address]             [phone]      4 Days Post-Op Procedure(s) (LRB): SUBXYPHOID PERICARDIAL WINDOW (N/A) CHEST TUBE INSERTION (Left) Subjective: Feels some SOB, still with some N/V , no BM, + flatus, no abdominal pain  Objective: Vital signs in last 24 hours: Temp:  [97.4 F (36.3 C)-98.4 F (36.9 C)] 98.4 F (36.9 C) (12/07 0352) Pulse Rate:  [65-77] 65 (12/07 0733) Cardiac Rhythm: Normal sinus rhythm (12/07 0700) Resp:  [13-29] 17 (12/07 0733) BP: (109-129)/(59-76) 118/67 (12/07 0352) SpO2:  [98 %-100 %] 99 % (12/07 0733)  Hemodynamic parameters for last 24 hours:    Intake/Output from previous day: 12/06 0701 - 12/07 0700 In: 900 [I.V.:900] Out: 1220 [Urine:525; Emesis/NG output:5; Chest Tube:690] Intake/Output this shift: No intake/output data recorded.  General appearance: alert, cooperative and no distress Heart: regular rate and rhythm Lungs: coarse BS, somewhat dim throughout Abdomen: benign Extremities: no edema Wound: incis healing well  Lab Results: Recent Labs    09/13/17 0225 09/14/17 0330  WBC 10.3 8.4  HGB 8.8* 8.9*  HCT 29.1* 28.8*  PLT 191 175   BMET:  Recent Labs    09/14/17 0330 10/05/2017 0330  NA 125* 128*  K 5.0 4.9  CL 91* 95*  CO2 25 26  GLUCOSE 107* 125*  BUN 40* 35*  CREATININE 1.69* 1.62*  CALCIUM 8.3* 8.2*    PT/INR: No results for input(s): LABPROT, INR in the last 72 hours. ABG    Component Value Date/Time   PHART 7.389 09/12/2017 0310   HCO3 30.3 (H) 09/12/2017 0310   TCO2 27 03/19/2010 0726   O2SAT 94.8 09/12/2017 0310   CBG (last 3)  No results for input(s): GLUCAP in the last 72 hours.  Meds Scheduled Meds: . acetaminophen  1,000 mg Oral Q6H   Or  . acetaminophen (TYLENOL) oral liquid 160 mg/5 mL  1,000 mg Oral Q6H  . amLODipine  5 mg Oral Daily  . aspirin EC  81 mg Oral Daily  . atorvastatin  80 mg Oral Daily  . bisacodyl  10 mg Oral Daily  .  budesonide  0.5 mg Nebulization BID  . cholecalciferol  1,000 Units Oral Daily  . enoxaparin (LOVENOX) injection  30 mg Subcutaneous Q24H  . ferrous sulfate  325 mg Oral Q breakfast  . ipratropium-albuterol  3 mL Nebulization BID  . levothyroxine  125 mcg Oral QAC breakfast  . metoprolol succinate  50 mg Oral BID  . multivitamin  1 tablet Oral BID  . pantoprazole  40 mg Oral Daily  . predniSONE  20 mg Oral Q breakfast  . protein supplement shake  2 oz Oral Daily  . senna-docusate  1 tablet Oral QHS   Continuous Infusions: . sodium chloride 75 mL/hr at 09/12/2017 0251  . lactated ringers Stopped (09/10/2017 1215)  . potassium chloride     PRN Meds:.fentaNYL (SUBLIMAZE) injection, lidocaine-prilocaine, magnesium hydroxide, ondansetron (ZOFRAN) IV, oxyCODONE, potassium chloride, sodium chloride flush, traMADol  Xrays Dg Chest Port 1 View  Result Date: 09/14/2017 CLINICAL DATA:  Shortness of breath. Pleural effusion. Left chest tube. EXAM: PORTABLE CHEST 1 VIEW COMPARISON:  09/13/2017 FINDINGS: Left chest tube remains in place. Tiny amount of pleural air persists, not increased. Left pleural and parenchymal density appears similar. Chronic interstitial scarring in the right lung appears the same with overall code aeration. Power port  is unchanged. IMPRESSION: No increase in a small left pneumothorax. Similar appearance of persistent pleural and parenchymal density on the left. Electronically Signed   By: Nelson Chimes M.D.   On: 09/14/2017 08:18   Results for orders placed or performed during the hospital encounter of 09/26/2017  Fungus Culture With Stain     Status: None (Preliminary result)   Collection Time: 09/12/2017 12:02 PM  Result Value Ref Range Status   Fungus Stain Final report  Final    Comment: (NOTE) Performed At: [facility] [place], [place] [id number] Rush Farmer MD [id number]    Fungus (Mycology) Culture PENDING  Incomplete   Fungal Source FLUID   Final    Comment: PERICARDIAL  Acid Fast Smear (AFB)     Status: None   Collection Time: 09/27/2017 12:02 PM  Result Value Ref Range Status   AFB Specimen Processing Concentration  Final   Acid Fast Smear Negative  Final    Comment: (NOTE) Performed At: [facility] [place], [place] [id number] Rush Farmer MD OY:[id number]    Source (AFB) FLUID  Final    Comment: PERICARDIAL  Culture, body fluid-bottle     Status: None (Preliminary result)   Collection Time: 10/02/2017 12:02 PM  Result Value Ref Range Status   Specimen Description FLUID PERICARDIAL  Final   Special Requests NONE  Final   Culture NO GROWTH 3 DAYS  Final   Report Status PENDING  Incomplete  Gram stain     Status: None   Collection Time: 09/16/2017 12:02 PM  Result Value Ref Range Status   Specimen Description FLUID PERICARDIAL  Final   Special Requests NONE  Final   Gram Stain NO WBC SEEN NO ORGANISMS SEEN   Final   Report Status 10/02/2017 FINAL  Final  Fungus Culture Result     Status: None   Collection Time: 09/22/2017 12:02 PM  Result Value Ref Range Status   Result 1 Comment  Final    Comment: (NOTE) KOH/Calcofluor preparation:  no fungus observed. Performed At: [facility] [address] [place], [place] [id number] Rush Farmer MD GG:[id number]   Fungus Culture With Stain     Status: None (Preliminary result)   Collection Time: 09/19/2017 12:05 PM  Result Value Ref Range Status   Fungus Stain Final report  Final    Comment: (NOTE) Performed At: [facility] [place], [place] [id number] Rush Farmer MD [id number]    Fungus (Mycology) Culture PENDING  Incomplete   Fungal Source TISSUE  Final    Comment: PERICARDIUM  Aerobic/Anaerobic Culture (surgical/deep wound)     Status: None (Preliminary result)   Collection Time: 09/26/2017 12:05 PM  Result Value Ref Range Status   Specimen Description TISSUE  Final   Special Requests PERICARDIUM  Final    Gram Stain   Final    RARE WBC PRESENT, PREDOMINANTLY PMN NO ORGANISMS SEEN    Culture   Final    NO GROWTH 3 DAYS NO ANAEROBES ISOLATED; CULTURE IN PROGRESS FOR 5 DAYS   Report Status PENDING  Incomplete  Acid Fast Smear (AFB)     Status: None   Collection Time: 09/10/2017 12:05 PM  Result Value Ref Range Status   AFB Specimen Processing Comment  Final    Comment: Tissue Grinding and Digestion/Decontamination   Acid Fast Smear Negative  Final    Comment: (NOTE) Performed At: [facility] [place], [place] [id number] Rush Farmer MD  LH:[id number]    Source (AFB) TISSUE  Final    Comment: PERICARDIUM  Virus culture     Status: None   Collection Time: 10/06/2017 12:05 PM  Result Value Ref Range Status   Viral Culture Comment  Final    Comment: (NOTE) Preliminary Report: No virus isolated at 4 days.  Next report to follow after 7 days. Performed At: [facility] [place], [place] [id number] Rush Farmer MD HR:[id number]    Source of Sample TISSUE  Final    Comment: PERICARDIUM  Fungus Culture Result     Status: None   Collection Time: 10/03/2017 12:05 PM  Result Value Ref Range Status   Result 1 Comment  Final    Comment: (NOTE) KOH/Calcofluor preparation:  no fungus observed. Performed At: [facility] [place], [place] [id number] Rush Farmer MD [id number]    Path:pending   Assessment/Plan: S/P Procedure(s) (LRB): SUBXYPHOID PERICARDIAL WINDOW (N/A) CHEST TUBE INSERTION (Left)  1 conts to have moderate CT drainage- 690 recorded yesterday and 120 cc so far today- leave tubes for now 2 still with N/V - somewhat improved- multifactorial- cont IVF for now 3 Creat , sodium and chloride all improving 4 path pending, micro unremarkable 5 hemodyn stable in sinus rhythm 6 cont pulm toilet and rehab as able  LOS: 4 days    Renee Vance 10/02/2017 Patient seen and examined, agree with  above Still high output from pleural tube and less from pericardial drain Pericardial drain at 500 ml since OR Will keep both drains today Creatinine trending down- will change IV to KVO Nausea persists- will give Reglan for 24 hours Continue OOB/ ambulation Cultures negative so far Path no malignancy, mild inflammation  Remo Lipps C. Roxan Hockey, MD [facility] [phone]

## 2017-09-15 NOTE — Discharge Instructions (Signed)
Pericardial Effusion Pericardial effusion is a buildup of fluid around your heart. The heart is surrounded by a double-layered sac (pericardium). This sac normally contains a small amount of fluid. When too much builds up, it can put pressure on your heart and cause problems. As fluid builds up in the pericardial sac and pressure on your heart increases, it becomes harder for your heart to pump blood. When fluid prevents your heart from pumping enough blood, it is called cardiac tamponade. Cardiac tamponade is a life-threatening condition. What are the causes? Often the cause of pericardial effusion is not known (idiopathic effusion). Possible causes are from:  Infections, such as from a virus, bacteria, fungus, or parasite.  Damage to the pericardium from heart surgery or a heart attack.  Inflammatory diseases, such as rheumatoid arthritis or lupus.  Kidney disease.  Thyroid disease.  Cancer.  Cancer treatment, including radiation or chemotherapy.  Certain drugs, including tuberculosis drugs or seizure drugs.  Chest trauma.  What are the signs or symptoms? Pericardial effusion may not cause symptoms at first, especially if the fluid builds up slowly. In time, pressure on the heart may cause:  Chest pain.  Trouble breathing.  Pain and shortness of breath that is worse when lying down.  Dizziness.  Fainting.  Cough.  Hiccups.  Skipped heartbeats (palpitations).  Anxiety and confusion.  A bluish skin color (cyanosis).  Swollen legs and ankles.  How is this diagnosed? Your health care provider may suspect pericardial effusion based on your symptoms. Your health care provider may also do a physical exam to check for:  Low blood pressure and weak pulses.  Soft (muffled) heart sounds.  Rapid heartbeat.  Full veins in your neck (distended jugular veins).  Decreased breathing sounds and a rubbing sound (friction rub) when listening to your lungs.  Your health care  provider may also do several tests to confirm the diagnosis and find out what is causing the pericardial effusion. These may include:  Chest X-ray.  Imaging study of the heart using sound waves (echocardiogram).  CT scan or MRI.  Electrical study of the heart (electrocardiogram [ECG]).  A procedure using a needle to remove fluid from the pericardium for examination (pericardiocentesis).  Blood tests to check for: ? Infection. ? Heart damage. ? Thyroid abnormalities. ? Kidney disease. ? Inflammatory disorders.  How is this treated? Treatment for pericardial effusion depends on the cause of your symptoms and how severe your symptoms are. If a specific cause was found, that cause will be treated. Treatment may include:  Medicines, such as: ? Nonsteroidal anti-inflammatory drugs (NSAIDs). ? Other anti-inflammatory drugs, such as steroids. ? Antibiotic medicine. ? Antifungal medicine.  Hospital treatment may be necessary, such as for cardiac tamponade. This may include: ? Intravenous (IV) fluids. ? Breathing support.  Surgery may be needed in severe cases. Surgery may include: ? Pericardiocentesis. ? Open heart surgery. ? A procedure to make a permanent opening in the pericardium (pericardial window).  Contact a health care provider if:  You feel dizzy or light-headed.  You have swelling in your legs or ankles.  You have heart palpitations.  You have persistent cough or hiccups. Get help right away if:  You faint.  You have chest pain.  You have trouble breathing. These symptoms may represent a serious problem that is an emergency. Do not wait to see if the symptoms will go away. Get medical help right away. Call your local emergency services (911 in the [place] Do not drive yourself to  the hospital. This information is not intended to replace advice given to you by your health care provider. Make sure you discuss any questions you have with your health care  provider. Document Released: 05/24/2005 Document Revised: 03/03/2016 Document Reviewed: 02/13/2014 Elsevier Interactive Patient Education  2017 [employer].

## 2017-09-15 NOTE — Progress Notes (Signed)
Occupational Therapy Treatment Patient Details Name: Renee Vance MRN: [id number] DOB: [birth year] Today's Date: 10/01/2017    History of present illness Pt is a 76 y.o. female admitted on 09/29/2017 for pericardial window due to pericardial effusion with chest tube placement. 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   OT comments  Pt demonstrates improving activity tolerance.  She was able to ambulate ~63ft with min guard assist.  She requires min A for ADLs.  02 sats dropped to 79% with activity on 4L supplemental 02, but improved to mid 90s with seated rest break.    Follow Up Recommendations  Home health OT    Equipment Recommendations  3 in 1 bedside commode    Recommendations for Other Services      Precautions / Restrictions Precautions Precautions: Fall Precaution Comments: Chest tubes Restrictions Weight Bearing Restrictions: No       Mobility Bed Mobility Overal bed mobility: Needs Assistance Bed Mobility: Supine to Sit     Supine to sit: Supervision     General bed mobility comments: Supervision for safety. Use of bed rails and elevated HPB.   Transfers Overall transfer level: Needs assistance Equipment used: Rolling walker (2 wheeled) Transfers: Sit to/from [facility] Sit to Stand: Min guard Stand pivot transfers: Min assist       General transfer comment: asisst for walker safety and to steady     Balance Overall balance assessment: Needs assistance Sitting-balance support: Feet supported Sitting balance-Leahy Scale: Good     Standing balance support: Bilateral upper extremity supported;During functional activity Standing balance-Leahy Scale: Poor Standing balance comment: requires UE support                            ADL either performed or assessed with clinical judgement   ADL Overall ADL's : Needs  assistance/impaired                         Toilet Transfer: Minimal assistance;Ambulation;BSC;RW Toilet Transfer Details (indicate cue type and reason): assist for balance especially when fatigued  Toileting- Clothing Manipulation and Hygiene: Minimal assistance;Sit to/from stand       Functional mobility during ADLs: Minimal assistance;Rolling walker       Vision       Perception     Praxis      Cognition Arousal/Alertness: Awake/alert Behavior During Therapy: WFL for tasks assessed/performed Overall Cognitive Status: Within Functional Limits for tasks assessed                                          Exercises     Shoulder Instructions       General Comments 02 sats decreased to 79% on 4L supplemental 02 during activity.  Once she moved to sitting position sats improved to mid 90s with seated rest break     Pertinent Vitals/ Pain       Pain Assessment: Faces Faces Pain Scale: No hurt  Home Living                                          Prior Functioning/Environment  Frequency  Min 2X/week        Progress Toward Goals  OT Goals(current goals can now be found in the care plan section)  Progress towards OT goals: Progressing toward goals  Acute Rehab OT Goals Patient Stated Goal: Return home  Plan Discharge plan remains appropriate    Co-evaluation                 AM-PAC PT "6 Clicks" Daily Activity     Outcome Measure   Help from another person eating meals?: None Help from another person taking care of personal grooming?: A Little Help from another person toileting, which includes using toliet, bedpan, or urinal?: A Little Help from another person bathing (including washing, rinsing, drying)?: A Lot Help from another person to put on and taking off regular upper body clothing?: A Lot Help from another person to put on and taking off regular lower body clothing?: Total 6  Click Score: 15    End of Session Equipment Utilized During Treatment: Rolling walker;Oxygen  OT Visit Diagnosis: Unsteadiness on feet (R26.81)   Activity Tolerance Patient limited by fatigue   Patient Left in chair;with call bell/phone within reach   Nurse Communication Mobility status        Time: 1594-5859 OT Time Calculation (min): 37 min  Charges: OT General Charges $OT Visit: 1 Visit OT Treatments $Self Care/Home Management : 8-22 mins $Therapeutic Activity: 8-22 mins  [facility], OTR/L [phone]    Lucille Passy M 09/26/2017, 3:58 PM

## 2017-09-15 NOTE — Progress Notes (Signed)
Physical Therapy Treatment Patient Details Name: Renee Vance MRN: [id number] DOB: December 08, 1940 Today's Date: 09/25/2017    History of Present Illness Pt is a 76 y.o. female admitted on 09/13/2017 for pericardial window due to pericardial effusion with chest tube placement. 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    PT Comments    Pt limited this session secondary to episode of nausea/vomiting prior to entry. Pt only wanting to go straight to chair this session. Required min guard assist and assist for lines/leads for short ambulation distance to chair using RW. VSS throughout. Current recommendations remain appropriate. Will continue to follow acutely to progress mobility according to pt tolerance.    Follow Up Recommendations  Home health PT;Supervision for mobility/OOB     Equipment Recommendations  Rolling walker with 5" wheels    Recommendations for Other Services       Precautions / Restrictions Precautions Precautions: Fall Precaution Comments: Chest tubes Restrictions Weight Bearing Restrictions: No    Mobility  Bed Mobility Overal bed mobility: Needs Assistance Bed Mobility: Supine to Sit     Supine to sit: Supervision     General bed mobility comments: Supervision for safety. Use of bed rails and elevated HPB.   Transfers Overall transfer level: Needs assistance Equipment used: Rolling walker (2 wheeled) Transfers: Sit to/from Stand Sit to Stand: Min guard         General transfer comment: [employer] for safety. Assist for lines and leads.   Ambulation/Gait Ambulation/Gait assistance: Min guard Ambulation Distance (Feet): 2 Feet Assistive device: Rolling walker (2 wheeled) Gait Pattern/deviations: Step-through pattern;Decreased stride length;Trunk flexed Gait velocity: Decreased Gait velocity interpretation: Below normal speed for age/gender General  Gait Details: Limited gait to chair this session secondary to episode of nausea/vomiting. Min guard for safety for ambulation to chair. Cues for upright posture and proximity to device.    Stairs            Wheelchair Mobility    Modified Rankin (Stroke Patients Only)       Balance Overall balance assessment: Needs assistance Sitting-balance support: Feet supported Sitting balance-Leahy Scale: Good     Standing balance support: Bilateral upper extremity supported;During functional activity Standing balance-Leahy Scale: Poor Standing balance comment: requires UE support                             Cognition Arousal/Alertness: Awake/alert Behavior During Therapy: WFL for tasks assessed/performed Overall Cognitive Status: Within Functional Limits for tasks assessed                                        Exercises      General Comments General comments (skin integrity, edema, etc.): VSS throughout       Pertinent Vitals/Pain Pain Assessment: No/denies pain    Home Living                      Prior Function            PT Goals (current goals can now be found in the care plan section) Acute Rehab PT Goals Patient Stated Goal: Return home PT Goal Formulation: With patient Time For Goal Achievement: 09/28/17 Potential to Achieve Goals: Good Progress towards PT goals: Progressing toward goals  Frequency    Min 3X/week      PT Plan Current plan remains appropriate    Co-evaluation              AM-PAC PT "6 Clicks" Daily Activity  Outcome Measure  Difficulty turning over in bed (including adjusting bedclothes, sheets and blankets)?: A Little Difficulty moving from lying on back to sitting on the side of the bed? : A Little Difficulty sitting down on and standing up from a chair with arms (e.g., wheelchair, bedside commode, etc,.)?: Unable Help needed moving to and from a bed to chair (including a  wheelchair)?: A Little Help needed walking in hospital room?: A Little Help needed climbing 3-5 steps with a railing? : A Little 6 Click Score: 16    End of Session Equipment Utilized During Treatment: Oxygen Activity Tolerance: Treatment limited secondary to medical complications (Comment)(nausea/vomiting ) Patient left: in chair;with call bell/phone within reach Nurse Communication: Mobility status PT Visit Diagnosis: Other abnormalities of gait and mobility (R26.89);Muscle weakness (generalized) (M62.81)     Time: 8338-2505 PT Time Calculation (min) (ACUTE ONLY): 16 min  Charges:  $Gait Training: 8-22 mins                    G Codes:       Renee Vance, PT, DPT  Acute Rehabilitation Services  Pager: [phone]    Renee Vance 10/02/2017, 1:15 PM

## 2017-09-16 ENCOUNTER — Inpatient Hospital Stay (HOSPITAL_COMMUNITY): Payer: PPO

## 2017-09-16 LAB — BASIC METABOLIC PANEL
Anion gap: 8 (ref 5–15)
BUN: 31 mg/dL — AB (ref 6–20)
CHLORIDE: 97 mmol/L — AB (ref 101–111)
CO2: 25 mmol/L (ref 22–32)
Calcium: 8.4 mg/dL — ABNORMAL LOW (ref 8.9–10.3)
Creatinine, Ser: 1.42 mg/dL — ABNORMAL HIGH (ref 0.44–1.00)
GFR calc Af Amer: 40 mL/min — ABNORMAL LOW (ref >60)
GFR calc non Af Amer: 34 mL/min — ABNORMAL LOW (ref >60)
GLUCOSE: 107 mg/dL — AB (ref 65–99)
POTASSIUM: 5.2 mmol/L — AB (ref 3.5–5.1)
Sodium: 130 mmol/L — ABNORMAL LOW (ref 135–145)

## 2017-09-16 LAB — CULTURE, BODY FLUID-BOTTLE: CULTURE: NO GROWTH

## 2017-09-16 LAB — CULTURE, BODY FLUID W GRAM STAIN -BOTTLE

## 2017-09-16 LAB — AEROBIC/ANAEROBIC CULTURE W GRAM STAIN (SURGICAL/DEEP WOUND)

## 2017-09-16 LAB — CBC
HEMATOCRIT: 27.3 % — AB (ref 36.0–46.0)
HEMOGLOBIN: 8.5 g/dL — AB (ref 12.0–15.0)
MCH: 30.8 pg (ref 26.0–34.0)
MCHC: 31.1 g/dL (ref 30.0–36.0)
MCV: 98.9 fL (ref 78.0–100.0)
Platelets: 192 10*3/uL (ref 150–400)
RBC: 2.76 MIL/uL — AB (ref 3.87–5.11)
RDW: 17.1 % — ABNORMAL HIGH (ref 11.5–15.5)
WBC: 6.4 10*3/uL (ref 4.0–10.5)

## 2017-09-16 LAB — AEROBIC/ANAEROBIC CULTURE (SURGICAL/DEEP WOUND): CULTURE: NO GROWTH

## 2017-09-16 MED ORDER — LEVALBUTEROL HCL 0.63 MG/3ML IN NEBU
0.6300 mg | INHALATION_SOLUTION | Freq: Four times a day (QID) | RESPIRATORY_TRACT | Status: DC
  Administered 2017-09-17 – 2017-09-22 (×22): 0.63 mg via RESPIRATORY_TRACT
  Filled 2017-09-16 (×22): qty 3

## 2017-09-16 MED ORDER — IPRATROPIUM-ALBUTEROL 0.5-2.5 (3) MG/3ML IN SOLN
3.0000 mL | RESPIRATORY_TRACT | Status: DC | PRN
  Administered 2017-09-17 – 2017-09-21 (×4): 3 mL via RESPIRATORY_TRACT
  Filled 2017-09-16 (×4): qty 3

## 2017-09-16 MED ORDER — GUAIFENESIN ER 600 MG PO TB12
1200.0000 mg | ORAL_TABLET | Freq: Two times a day (BID) | ORAL | Status: DC
  Administered 2017-09-16 – 2017-09-21 (×11): 1200 mg via ORAL
  Filled 2017-09-16 (×12): qty 2

## 2017-09-16 MED ORDER — LACTULOSE 10 GM/15ML PO SOLN
30.0000 g | Freq: Once | ORAL | Status: AC
  Administered 2017-09-19: 30 g via ORAL
  Filled 2017-09-16 (×2): qty 45

## 2017-09-16 NOTE — Progress Notes (Signed)
Pt requiring minor disimpaction d/t constipation.  Pt states she has taken several different laxatives without success.  New order received per CVTS PA; discussed w/patient and agrees to attempt in the AM.

## 2017-09-16 NOTE — Progress Notes (Addendum)
      [address]       [place] [address]             [phone]      5 Days Post-Op Procedure(s) (LRB): SUBXYPHOID PERICARDIAL WINDOW (N/A) CHEST TUBE INSERTION (Left) Subjective: Feeling a little short of breath this morning.   Objective: Vital signs in last 24 hours: Temp:  [97.6 F (36.4 C)-98.7 F (37.1 C)] 97.6 F (36.4 C) (12/08 0801) Pulse Rate:  [68-79] 79 (12/08 0726) Cardiac Rhythm: Normal sinus rhythm (12/08 0706) Resp:  [15-31] 26 (12/08 0726) BP: (82-133)/(55-82) 133/82 (12/08 0354) SpO2:  [98 %-100 %] 98 % (12/08 0726)    Intake/Output from previous day: 12/07 0701 - 12/08 0700 In: -  Out: 1270 [Urine:700; Chest Tube:570] Intake/Output this shift: No intake/output data recorded.  General appearance: alert, cooperative and no distress Heart: regular rate and rhythm, S1, S2 normal, no murmur, click, rub or gallop Lungs: diffuse rhonchi and rub due to chest tubes Abdomen: soft, non-tender; bowel sounds normal; no masses,  no organomegaly Extremities: extremities normal, atraumatic, no cyanosis or edema Wound: clean and dry  Lab Results: Recent Labs    09/14/17 0330 09/16/17 0440  WBC 8.4 6.4  HGB 8.9* 8.5*  HCT 28.8* 27.3*  PLT 175 192   BMET:  Recent Labs    09/26/2017 0330 09/16/17 0440  NA 128* 130*  K 4.9 5.2*  CL 95* 97*  CO2 26 25  GLUCOSE 125* 107*  BUN 35* 31*  CREATININE 1.62* 1.42*  CALCIUM 8.2* 8.4*    PT/INR: No results for input(s): LABPROT, INR in the last 72 hours. ABG    Component Value Date/Time   PHART 7.389 09/12/2017 0310   HCO3 30.3 (H) 09/12/2017 0310   TCO2 27 03/19/2010 0726   O2SAT 94.8 09/12/2017 0310   CBG (last 3)  No results for input(s): GLUCAP in the last 72 hours.  Assessment/Plan: S/P Procedure(s) (LRB): SUBXYPHOID PERICARDIAL WINDOW (N/A) CHEST TUBE INSERTION (Left)  1. CV-Pericardial drain continues to put out large amounts of fluid. 528ml recorded over 24 hours. Continue  tubes. NSR in the 70s, BP well controlled.  2. Pulm-On 3L [place] this morning. Pulm toilet as able. CXR today shoed no pneumothorax but worsening loss/consolidation in the left mid lung. Continue Pulmicort and DuoNebs. Will order mucinex for secretions.  3. Creatinine 1.42, Hyperkalemia. Holding replacement.  Calcium and sodium improving.  4. ID-cultures negative so far 5. Nausea-Reglan and Zofran ordered.  Plan: continue to work on [employer]. Ambulate. Continue chest tubes. Mucinex for secretions.    LOS: 5 days    Elgie Collard 09/16/2017  Agree with above. Keep pericardial drain and chest tube in since still draining a lot.

## 2017-09-17 ENCOUNTER — Inpatient Hospital Stay (HOSPITAL_COMMUNITY): Payer: PPO

## 2017-09-17 DIAGNOSIS — J449 Chronic obstructive pulmonary disease, unspecified: Secondary | ICD-10-CM

## 2017-09-17 DIAGNOSIS — J189 Pneumonia, unspecified organism: Secondary | ICD-10-CM

## 2017-09-17 DIAGNOSIS — J9621 Acute and chronic respiratory failure with hypoxia: Secondary | ICD-10-CM

## 2017-09-17 DIAGNOSIS — J90 Pleural effusion, not elsewhere classified: Secondary | ICD-10-CM

## 2017-09-17 LAB — BLOOD GAS, ARTERIAL
Acid-Base Excess: 1.5 mmol/L (ref 0.0–2.0)
Bicarbonate: 26.2 mmol/L (ref 20.0–28.0)
Drawn by: 365291
O2 CONTENT: 5 L/min
O2 SAT: 94.8 %
PATIENT TEMPERATURE: 98.6
PO2 ART: 74.4 mmHg — AB (ref 83.0–108.0)
pCO2 arterial: 46.1 mmHg (ref 32.0–48.0)
pH, Arterial: 7.373 (ref 7.350–7.450)

## 2017-09-17 MED ORDER — IOPAMIDOL (ISOVUE-370) INJECTION 76%
INTRAVENOUS | Status: AC
  Administered 2017-09-17: 100 mL
  Filled 2017-09-17: qty 100

## 2017-09-17 MED ORDER — PANTOPRAZOLE SODIUM 40 MG PO TBEC
40.0000 mg | DELAYED_RELEASE_TABLET | Freq: Two times a day (BID) | ORAL | Status: DC
  Administered 2017-09-17 – 2017-09-21 (×8): 40 mg via ORAL
  Filled 2017-09-17 (×9): qty 1

## 2017-09-17 MED ORDER — VANCOMYCIN HCL IN DEXTROSE 1-5 GM/200ML-% IV SOLN
1000.0000 mg | INTRAVENOUS | Status: DC
  Administered 2017-09-17 – 2017-09-20 (×4): 1000 mg via INTRAVENOUS
  Filled 2017-09-17 (×5): qty 200

## 2017-09-17 MED ORDER — POTASSIUM CHLORIDE ER 10 MEQ PO TBCR
20.0000 meq | EXTENDED_RELEASE_TABLET | Freq: Once | ORAL | Status: AC
  Administered 2017-09-17: 20 meq via ORAL
  Filled 2017-09-17 (×2): qty 2

## 2017-09-17 MED ORDER — DEXTROSE 5 % IV SOLN
1.0000 g | INTRAVENOUS | Status: DC
  Administered 2017-09-17 – 2017-09-22 (×6): 1 g via INTRAVENOUS
  Filled 2017-09-17 (×9): qty 1

## 2017-09-17 MED ORDER — WHITE PETROLATUM EX OINT
TOPICAL_OINTMENT | CUTANEOUS | Status: AC
  Filled 2017-09-17: qty 28.35

## 2017-09-17 MED ORDER — METHYLPREDNISOLONE SODIUM SUCC 125 MG IJ SOLR
60.0000 mg | Freq: Two times a day (BID) | INTRAMUSCULAR | Status: DC
  Administered 2017-09-17 – 2017-09-22 (×11): 60 mg via INTRAVENOUS
  Filled 2017-09-17 (×12): qty 2

## 2017-09-17 MED ORDER — FUROSEMIDE 10 MG/ML IJ SOLN
80.0000 mg | Freq: Once | INTRAMUSCULAR | Status: AC
  Administered 2017-09-17: 80 mg via INTRAVENOUS
  Filled 2017-09-17: qty 8

## 2017-09-17 NOTE — Progress Notes (Signed)
Pharmacy Antibiotic Note  Renee Vance is a 76 y.o. female admitted on 09/28/2017 with pneumonia.  Pharmacy has been consulted for vanocmycin and cefepime dosing.  Scr mildly elevated.  Cultures negative so far.  Plan: 1. Vancomycin 1 g q 24 hrs.  Vancomycin trough at steady state as needed. 2. Cefepime 1g q 24 hrs. 3. F/u renal function, cultures, and clinical course.  Height: 5\' 4"  (162.6 cm) Weight: 153 lb 14.1 oz (69.8 kg) IBW/kg (Calculated) : 54.7  Temp (24hrs), Avg:98.1 F (36.7 C), Min:97.5 F (36.4 C), Max:98.7 F (37.1 C)  Recent Labs  Lab 09/12/17 0230 09/13/17 0225 09/14/17 0330 09/16/2017 0330 09/16/17 0440  WBC 9.2 10.3 8.4  --  6.4  CREATININE 1.56* 2.04* 1.69* 1.62* 1.42*    Estimated Creatinine Clearance: 31.3 mL/min (A) (by C-G formula based on SCr of 1.42 mg/dL (H)).    Allergies  Allergen Reactions  . Ampicillin Nausea And Vomiting and Other (See Comments)    Has patient had a PCN reaction causing immediate rash, facial/tongue/throat swelling, SOB or lightheadedness with hypotension: no Has patient had a PCN reaction causing severe rash involving mucus membranes or skin necrosis: no Has patient had a PCN reaction that required hospitalization: no Has patient had a PCN reaction occurring within the last 10 years: no If all of the above answers are "NO", then may proceed with Cephalosporin use  . Codeine Nausea And Vomiting  . Colchicine Diarrhea and Nausea And Vomiting    Antimicrobials this admission:  Vancomycin 12/9 > Cefepime 12/9>  Dose adjustments this admission:   Microbiology results:  12/9 BCx x 2: * 12/3 Pericardial fluid - negative Thank you for allowing pharmacy to be a part of this patient's care.  Uvaldo Rising, BCPS  Clinical Pharmacist Pager [phone]  09/17/2017 2:12 PM

## 2017-09-17 NOTE — Progress Notes (Addendum)
[address]       [place] [address]             [phone]      6 Days Post-Op Procedure(s) (LRB): SUBXYPHOID PERICARDIAL WINDOW (N/A) CHEST TUBE INSERTION (Left) Subjective: Feeling a little more short of breath this morning.   Objective: Vital signs in last 24 hours: Temp:  [97.5 F (36.4 C)-98.4 F (36.9 C)] 97.6 F (36.4 C) (12/09 0318) Pulse Rate:  [69-80] 72 (12/09 0715) Cardiac Rhythm: Normal sinus rhythm (12/09 0700) Resp:  [16-30] 16 (12/09 0715) BP: (104-138)/(60-72) 128/64 (12/09 0318) SpO2:  [94 %-100 %] 97 % (12/09 0715)     Intake/Output from previous day: 12/08 0701 - 12/09 0700 In: -  Out: [place] Intake/Output this shift: No intake/output data recorded.  General appearance: alert, cooperative and no distress Heart: regular rate and rhythm, S1, S2 normal, no murmur, click, rub or gallop Lungs: bilateral rhonchi, occassional wheeze on left side Abdomen: soft, non-tender; bowel sounds normal; no masses,  no organomegaly Extremities: 1-2+ pitting pedal edema  Wound: clean and dry  Lab Results: Recent Labs    09/16/17 0440  WBC 6.4  HGB 8.5*  HCT 27.3*  PLT 192   BMET:  Recent Labs    10/03/2017 0330 09/16/17 0440  NA 128* 130*  K 4.9 5.2*  CL 95* 97*  CO2 26 25  GLUCOSE 125* 107*  BUN 35* 31*  CREATININE 1.62* 1.42*  CALCIUM 8.2* 8.4*    PT/INR: No results for input(s): LABPROT, INR in the last 72 hours. ABG    Component Value Date/Time   PHART 7.389 09/12/2017 0310   HCO3 30.3 (H) 09/12/2017 0310   TCO2 27 03/19/2010 0726   O2SAT 94.8 09/12/2017 0310   CBG (last 3)  No results for input(s): GLUCAP in the last 72 hours.  Assessment/Plan: S/P Procedure(s) (LRB): SUBXYPHOID PERICARDIAL WINDOW (N/A) CHEST TUBE INSERTION (Left)   1. CV-Pericardial drain continues to put out large amounts of fluid. 647ml recorded over 24 hours. Continue tubes. NSR in the 70s, BP well controlled. Left chest  tube with intermittent air leak. + fluid wave.  2. Pulm-On 3L [place] this morning. Pulm toilet as able. CXR looks similar to yesterdays study, will await official read. Continue Pulmicort, DuoNebs and Mucinex.  3. Creatinine 1.42, Hyperkalemia. Holding replacement.  Calcium and sodium improving.  4. ID-cultures negative so far 5. Nausea-Improved with change in pain medication. Reglan and Zofran ordered.  Plan: IV Lasix 80mg  x 1. CXR in the AM. Will order labs for the AM.    LOS: 6 days    Elgie Collard 09/17/2017   Chart reviewed, patient examined, agree with above. She developed more shortness of breath this morning. Her CXR showed persistent left mid to lower lung consolidation and possibly some loculated fluid in pleural space laterally. She did not improve with lasix. Sats 90% on 5L. Decided to do a chest CTA to rule out PE and assess the pleural space and lungs. I asked CCM to see her since she has been followed by Dr. Elsworth Soho. The CTA does not show a PE. There is increased patchy consolidation in the right lung and worsening consolidation in the LLL compared to her preop CT. The left pleural tube is in place and there is a collection of air in the left pleural space. This looks like aspiration. She sounds very coarse. Says her breathing is somewhat better now after breathing  treatment. She has been started on vanc and Zosyn. If she worsens she may require bipap.

## 2017-09-17 NOTE — Progress Notes (Signed)
Pt calls out stating she feels very SOB; appears visually labored after waking up from short nap; sats down to 85% on her normal 2.5-3L/[place]; O2 requirements increased to 5L/[place] and continues to sat only 90%.  Left pleural CT has drained 170ml since 0600 and right mediastinal drain has had nothing out.  Dr. Cyndia Bent updated on patient condition change; new order received for CT angio of chest.  Will continue to monitor closely.

## 2017-09-17 NOTE — Progress Notes (Signed)
SWOT paged to assist w/transport to CT angio.  SWOT also concerned re: patient respiratory status and ability to lay flat; paged Rapid Response and Respiratory to bedside also to assist.  Dr. Cyndia Bent updated again re: significant changes in patient status.  Although sats remain in low 90s and no significant change in AM xray, the patient assessment and appearance is significantly different - increased dyspnea at rest, accessory muscle use, fear in patient eyes, tachypnea, patient reports of SOB.  Patient receives Xopenex scheduled treatment early in attempt to assist w/current difficulty in respirations; however, no significant change noted.  Patient requests for doctor to come to bedside.  Dr. Cyndia Bent notified of all changes above.  New orders received for ABG and notified RN able to consult CCM if appropriate.

## 2017-09-17 NOTE — Progress Notes (Signed)
Dr. Cyndia Bent at bedside to update patient re: results of CT Angio - negative for PE.  Orders left pleural chest tube to be returned to suction.  Husband updated re: condition again.  Patient remains dyspneic at rest but still somewhat improved overall.  O2 @ 5L/[place] w/sats low 90s.  Purewick in place to conserve energy.  Will continue to monitor closely

## 2017-09-17 NOTE — Progress Notes (Signed)
CCM at bedside for evaluation; new orders to be received.  Plan for continued transfer to CT angio for PE evaluation.  Respiratory remains at bedside and plans to administer duobneb prior to transport.  ABGs have been drawn; await result.  SWOT remains w/patient.  Husband updated.

## 2017-09-17 NOTE — Consult Note (Signed)
PULMONARY / CRITICAL CARE MEDICINE   Name: Renee Vance MRN: [id number] DOB: 03-18-1941    ADMISSION DATE:  09/28/2017 CONSULTATION DATE: 12/9  REFERRING MD: Dr. Cyndia Bent  CHIEF COMPLAINT: Acute on chronic respiratory failure  HISTORY OF PRESENT ILLNESS:   This is a 76 year old female patient followed by Dr. Elsworth Soho in the outpatient setting with a significant history of COPD (last FEV1 74% predicted; with 8% improvement from [place]) non-small cell lung cancer, stage IV, and now metastatic pericardial effusion and left pleural  effusion now status post pericardial window and left chest tube placement on 12/3.  Her postoperative course has been somewhat unremarkable with the exception of nausea and vomiting approximately 2 days prior to the pulmonary consult on 12/9.  Apparently she is been having some increased work of breathing mostly during the morning however on 12/9 she developed rather acute increase in her work of breathing.  She has had no chest pain, she has had a wet rhonchorous cough, and has required escalating supplemental oxygen.  A chest x-ray was obtained and this demonstrated the left loculated effusion without significant change but also new patchy right greater than left pulmonary infiltrates.  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), Arthritis, Bradycardia, CAD (coronary artery disease), Cellulitis of right forearm (05/10/2017), COPD (chronic obstructive pulmonary disease) ([place]), Coronary atherosclerosis of native coronary artery (2011), Dysrhythmia, Encounter for antineoplastic chemotherapy (06/23/2016), Fibrocystic breast, GERD (gastroesophageal reflux disease), History of radiation therapy (07/05/16 - 08/09/16), History of radiation therapy (06/14/2017-06/23/2017), HTN (hypertension), Hyperlipidemia, Hypothyroidism,  Neutropenic fever ([place]) (03/15/2017), Obesity, Pain in joint, Pneumonia, Right renal mass (11/30/2016), 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) (06/23/2016), and Thrombocytopenia ([place]) (03/15/2017).  PAST SURGICAL HISTORY: She  has a past surgical history that includes Cholecystectomy; Tubal ligation; Coronary angioplasty with stent (2011); Video bronchoscopy (Bilateral, 06/07/2016); IR FLUORO GUIDE PORT INSERTION RIGHT (04/07/2017); IR US Guide Vasc Access Right (04/07/2017); PERICARDIOCENTESIS (N/A, 08/07/2017); Subxyphoid pericardial window (N/A, 10/09/2017); and Chest tube insertion (Left, 09/14/2017).  Allergies  Allergen Reactions  . Ampicillin Nausea And Vomiting and Other (See Comments)    Has patient had a PCN reaction causing immediate rash, facial/tongue/throat swelling, SOB or lightheadedness with hypotension: no Has patient had a PCN reaction causing severe rash involving mucus membranes or skin necrosis: no Has patient had a PCN reaction that required hospitalization: no Has patient had a PCN reaction occurring within the last 10 years: no If all of the above answers are "NO", then may proceed with Cephalosporin use  . Codeine Nausea And Vomiting  . Colchicine Diarrhea and Nausea And Vomiting    No current facility-administered medications on file prior to encounter.    Current Outpatient Medications on File Prior to Encounter  Medication Sig  . amLODipine (NORVASC) 5 MG tablet Take 5 mg by mouth daily.  Marland Kitchen aspirin 81 MG tablet Take 81 mg by mouth daily.  Marland Kitchen atorvastatin (LIPITOR) 80 MG tablet Take 1 tablet (80 mg total) by mouth daily.  . budesonide (PULMICORT) 0.5 MG/2ML nebulizer solution Take 2 mLs (0.5 mg total) by nebulization 2 (two) times daily.  . feeding supplement (BOOST HIGH PROTEIN) LIQD Take 1 Container by mouth daily.   . ferrous sulfate 325 (65 FE) MG EC tablet Take 325 mg daily with breakfast by mouth.  Marland Kitchen ipratropium-albuterol (DUONEB) 0.5-2.5 (3) MG/3ML SOLN  Take  3 mLs by nebulization every 6 (six) hours as needed. (Patient taking differently: Take 3 mLs by nebulization 2 (two) times daily. )  . levothyroxine (SYNTHROID, LEVOTHROID) 125 MCG tablet Take 1 tablet (125 mcg total) daily before breakfast by mouth.  . metoprolol succinate (TOPROL-XL) 50 MG 24 hr tablet TAKE ONE TABLET BY MOUTH TWICE DAILY (Patient taking differently: TAKE 50 MG BY MOUTH TWICE DAILY)  . Multiple Vitamins-Minerals (ICAPS AREDS FORMULA PO) Take 1 tablet 2 (two) times daily by mouth.  Marland Kitchen omeprazole (PRILOSEC) 20 MG capsule Take 30- 60 min before your first and last meals of the day (Patient taking differently: Take 20 mg by mouth daily. )  . predniSONE (DELTASONE) 10 MG tablet Take 1 tablet (10 mg total) daily with breakfast by mouth.  . Vitamin D, Cholecalciferol, 1000 UNITS CAPS Take 1,000 Units by mouth daily.   Marland Kitchen acetaminophen (TYLENOL) 325 MG tablet Take 650 mg by mouth every 6 (six) hours as needed for moderate pain or headache.   . lidocaine-prilocaine (EMLA) cream Apply 1 application topically as needed (for port).    FAMILY HISTORY:  Her indicated that her mother is deceased. She indicated that her father is deceased. She indicated that her maternal grandmother is deceased. She indicated that her maternal grandfather is deceased. She indicated that her paternal grandmother is deceased. She indicated that her paternal grandfather is deceased.   SOCIAL HISTORY: She  reports that she quit smoking about 3 years ago. She has a 30.00 pack-year smoking history. she has never used smokeless tobacco. She reports that she does not drink alcohol or use drugs.  REVIEW OF SYSTEMS:   General: No fever, no chills, does report dyspnea.  HEENT: No headache, nasal congestion, or sore throat.  Pulmonary: Positive wet cough, no chest pain, positive for wheezing, positive for shortness of breath at rest rest.  Cardiac: No chest pain, no orthopnea.  GI: Did have nausea and vomiting 2 days  ago.  Not currently.  Muscular skeletal: Normal.  GU: Normal.  SUBJECTIVE:  Very short of breath  VITAL SIGNS: BP (!) 149/67 (BP Location: Left Arm)   Pulse 72   Temp 98.7 F (37.1 C) (Oral)   Resp 16   Ht 5\' 4"  (1.626 m)   Wt 153 lb 14.1 oz (69.8 kg)   SpO2 (!) 85%   BMI 26.41 kg/m   HEMODYNAMICS:    VENTILATOR SETTINGS:    INTAKE / OUTPUT: I/O last 3 completed shifts: In: -  Out: 1545 [Urine:700; Chest Tube:845]  PHYSICAL EXAMINATION: General: Acutely ill-appearing 76 year old female currently respiratory distress Neuro: Awake alert no focal deficits HEENT: Normocephalic atraumatic marked upper airway noises Cardiovascular: Regular rate and rhythm without murmur rub or gallop mediastinal tube unremarkable Lungs: Diffuse wheeze rhonchi positive tactile fremitus tidal noted in chest tubes Abdomen: Soft nontender no organomegaly positive bowel sounds tolerating diet Musculoskeletal: Strength and bulk Skin: Warm and dry  LABS:  BMET Recent Labs  Lab 09/14/17 0330 10/05/2017 0330 09/16/17 0440  NA 125* 128* 130*  K 5.0 4.9 5.2*  CL 91* 95* 97*  CO2 25 26 25   BUN 40* 35* 31*  CREATININE 1.69* 1.62* 1.42*  GLUCOSE 107* 125* 107*    Electrolytes Recent Labs  Lab 09/14/17 0330 09/27/2017 0330 09/16/17 0440  CALCIUM 8.3* 8.2* 8.4*    CBC Recent Labs  Lab 09/13/17 0225 09/14/17 0330 09/16/17 0440  WBC 10.3 8.4 6.4  HGB 8.8* 8.9* 8.5*  HCT 29.1* 28.8* 27.3*  PLT 191  175 192    Coag's No results for input(s): APTT, INR in the last 168 hours.  Sepsis Markers No results for input(s): LATICACIDVEN, PROCALCITON, O2SATVEN in the last 168 hours.  ABG Recent Labs  Lab 09/12/17 0310  PHART 7.389  PCO2ART 51.4*  PO2ART 76.3*    Liver Enzymes Recent Labs  Lab 09/13/17 0225  AST 32  ALT 26  ALKPHOS 60  BILITOT 0.8  ALBUMIN 2.2*    Cardiac Enzymes No results for input(s): TROPONINI, PROBNP in the last 168 hours.  Glucose No results for  input(s): GLUCAP in the last 168 hours.  Imaging Dg Chest Port 1 View  Result Date: 09/17/2017 CLINICAL DATA:  Shortness of breath.  Follow-up exam. EXAM: PORTABLE CHEST 1 VIEW COMPARISON:  09/16/2017 FINDINGS: Left lung consolidation is unchanged from the previous day's exam. Peripheral coarse reticular opacities in the right lung are similar to prior exams consistent with fibrosis. No new lung abnormalities. Left chest tube is stable. No pneumothorax. IMPRESSION: 1. No significant change from previous day's study. 2. Persistent left mid to lower lung zone consolidation superimposed on changes of interstitial fibrosis. 3. Stable left chest tube.  No pneumothorax. Electronically Signed   By: Lajean Manes M.D.   On: 09/17/2017 09:17     STUDIES:  CT scan 12/9  CULTURES: Blood culture 12/9  ANTIBIOTICS: Vanc 12/9 Cefepime 12/9  SIGNIFICANT EVENTS:     ASSESSMENT / PLAN:  Acute on chronic hypoxic respiratory failure: Suspect this is multifactorial and due to new patchy right greater than left airspace disease worrisome for aspiration event given recent vomiting, further complicated by acute exacerbation of COPD superimposed on known chronic left effusion  Plan/rec  Agree with CT chest, reasonable to rule out pulmonary emboli however with chest X ray findings above suspect this explains her acute hypoxia Pro-calcitonin algorithm Scheduled bronchodilators Start empiric antibiotics for H CAP coverage  Add systemic steroids for bronchospasm PPI bid for upper airway component  Ideally keep on dry side from pulmonary standpoint if hemodynamics will allow  History of non-small cell lung cancer stage IV status post chemoradiation. malignant pericardial and pleural effusions now status post pericardial window and left chest tube drainage Plan Chest tube management per CVTS Follow-up with heme onc  Mild AKI with borderline hyperkalemia -Got Lasix earlier, I am worried with pending CT  angio about renal function Plan Hold further diuresis Repeat a.m. Chemistry  Mild anemia Plan Per primary team   Erick Colace ACNP-BC [place] Pager # [phone] OR # [phone] if no answer  76 year old female with COPD that presents to [facility] for respiratory failure post pericardial window and CT placement.  On exam, patient is wheezing with RLL crackles.  I reviewed CXR myself, new RLL infiltrate noted.  Discussed with PCCM-NP.  COPD exacerbation:             - Solumedrol 60 mg IV q12             - Bronchodilators as ordered.  Aspiration Pneumonia:             - Vanc             - Cefepime             - Aspiration precautions.             - Blood cultures             - PCT  Hypoxemia:             -  Titrate O2 for sat of 88-92%  Pleural effusion:             - CT to suction  PCCM will follow.  Patient seen and examined, agree with above note.  I dictated the care and orders written for this patient under my direction.  Rush Farmer, [place]

## 2017-09-18 ENCOUNTER — Inpatient Hospital Stay (HOSPITAL_COMMUNITY): Payer: PPO

## 2017-09-18 DIAGNOSIS — I313 Pericardial effusion (noninflammatory): Principal | ICD-10-CM

## 2017-09-18 DIAGNOSIS — J441 Chronic obstructive pulmonary disease with (acute) exacerbation: Secondary | ICD-10-CM

## 2017-09-18 DIAGNOSIS — J9601 Acute respiratory failure with hypoxia: Secondary | ICD-10-CM

## 2017-09-18 LAB — CBC
HEMATOCRIT: 27.2 % — AB (ref 36.0–46.0)
HEMOGLOBIN: 8.8 g/dL — AB (ref 12.0–15.0)
MCH: 31.8 pg (ref 26.0–34.0)
MCHC: 32.4 g/dL (ref 30.0–36.0)
MCV: 98.2 fL (ref 78.0–100.0)
Platelets: 179 10*3/uL (ref 150–400)
RBC: 2.77 MIL/uL — AB (ref 3.87–5.11)
RDW: 17.3 % — ABNORMAL HIGH (ref 11.5–15.5)
WBC: 7 10*3/uL (ref 4.0–10.5)

## 2017-09-18 LAB — BASIC METABOLIC PANEL
ANION GAP: 9 (ref 5–15)
BUN: 42 mg/dL — ABNORMAL HIGH (ref 6–20)
CHLORIDE: 94 mmol/L — AB (ref 101–111)
CO2: 27 mmol/L (ref 22–32)
Calcium: 8.9 mg/dL (ref 8.9–10.3)
Creatinine, Ser: 1.4 mg/dL — ABNORMAL HIGH (ref 0.44–1.00)
GFR calc non Af Amer: 35 mL/min — ABNORMAL LOW (ref >60)
GFR, EST AFRICAN AMERICAN: 41 mL/min — AB (ref >60)
Glucose, Bld: 116 mg/dL — ABNORMAL HIGH (ref 65–99)
POTASSIUM: 5.5 mmol/L — AB (ref 3.5–5.1)
SODIUM: 130 mmol/L — AB (ref 135–145)

## 2017-09-18 NOTE — Progress Notes (Signed)
Husband updated via telephone re: patient status.  Notified of patient's need for energy conservation; dyspneic at rest; assisted up to chair earlier per PT and remains in chair at this time - states comfortable.  CVTS has removed mediastinal CT from right lateral side; dressing C/D/I.  This RN concerned re: nutritional status - very little PO intake d/t loss of appetite, distaste for hospital food; and dyspnea w/eating.  Recommending nutrition consult to care team.

## 2017-09-18 NOTE — Progress Notes (Signed)
      [address]       [place] [address]             [phone]     Pericardial tube removed per routine technique  Patient tolerated well    John Giovanni, PA-C

## 2017-09-18 NOTE — Progress Notes (Signed)
      [address]       [place] [address]             [phone]      7 Days Post-Op Procedure(s) (LRB): SUBXYPHOID PERICARDIAL WINDOW (N/A) CHEST TUBE INSERTION (Left) Subjective: Feeling short of breath this morning.   Objective: Vital signs in last 24 hours: Temp:  [97.8 F (36.6 C)-98.7 F (37.1 C)] 98 F (36.7 C) (12/10 0740) Pulse Rate:  [63-77] 63 (12/10 0300) Cardiac Rhythm: Normal sinus rhythm (12/10 0740) Resp:  [16-27] 22 (12/10 0300) BP: (116-149)/(62-91) 126/90 (12/10 0740) SpO2:  [85 %-100 %] 100 % (12/10 0300) Weight:  [160 lb 11.5 oz (72.9 kg)] 160 lb 11.5 oz (72.9 kg) (12/10 0300)     Intake/Output from previous day: 12/09 0701 - 12/10 0700 In: 340 [I.V.:90; IV Piggyback:250] Out: 905 [Urine:700; Chest Tube:205] Intake/Output this shift: Total I/O In: 26.2 [I.V.:26.2] Out: 470 [Urine:400; Chest Tube:70]  General appearance: alert, cooperative and mild distress Lungs: diffuse rhonchi and occassional wheeze Abdomen: soft, non-tender; bowel sounds normal; no masses,  no organomegaly Extremities: 1+ pitting edema L > R Wound: clean and dry  Lab Results: Recent Labs    09/16/17 0440 09/18/17 0515  WBC 6.4 7.0  HGB 8.5* 8.8*  HCT 27.3* 27.2*  PLT 192 179   BMET:  Recent Labs    09/16/17 0440 09/18/17 0515  NA 130* 130*  K 5.2* 5.5*  CL 97* 94*  CO2 25 27  GLUCOSE 107* 116*  BUN 31* 42*  CREATININE 1.42* 1.40*  CALCIUM 8.4* 8.9    PT/INR: No results for input(s): LABPROT, INR in the last 72 hours. ABG    Component Value Date/Time   PHART 7.373 09/17/2017 1330   HCO3 26.2 09/17/2017 1330   TCO2 27 03/19/2010 0726   O2SAT 94.8 09/17/2017 1330   CBG (last 3)  No results for input(s): GLUCAP in the last 72 hours.  Assessment/Plan: S/P Procedure(s) (LRB): SUBXYPHOID PERICARDIAL WINDOW (N/A) CHEST TUBE INSERTION (Left)  1. CV-Pericardial drain put out only 15cc in 12 hours according to recorded values.  NSR  in the 60s, BP well controlled. Left chest tube placed back on suction.  2. Pulm-Chest tube drainage 16ml recorded over 12 hours.  On 5L [place] this morning. Pulm toilet as able. CXR looks similar to yesterdays study perhaps slight improvement in the left lung aeration, will await official read.Continue Pulmicort, DuoNebs and Mucinex. Lasix dose yesterday without much improvement. CT angio negative for PE. 3. Creatinine 1.40, Hyperkalemia. Holding replacement. Calcium and sodium improving.  4. ID-cultures negative 5. Nausea-Improved with change in pain medication. Reglan and Zofran ordered. 6. Possible Aspiration-started on empiric Vanc and Zosyn. Blood cultures pending.  Plan: remove pericardial drain today. CCM consulted for assistance. Work on increasing oral intake. OOB to chair.    LOS: 7 days    Elgie Collard 09/18/2017

## 2017-09-18 NOTE — Progress Notes (Signed)
PULMONARY / CRITICAL CARE MEDICINE   Name: Renee Vance MRN: [id number] DOB: [birth year]    ADMISSION DATE:  10/04/2017 CONSULTATION DATE: 12/9  REFERRING MD: Dr. Cyndia Bent  CHIEF COMPLAINT: Acute on chronic respiratory failure  HISTORY OF PRESENT ILLNESS:   This is a 76 year old female patient followed by Dr. Elsworth Soho in the outpatient setting with a significant history of COPD (last FEV1 74% predicted; with 8% improvement from [place]) non-small cell lung cancer, stage IV, and now metastatic pericardial effusion and left pleural effusion now status post pericardial window and left chest tube placement on 12/3.  Her postoperative course has been somewhat unremarkable with the exception of nausea and vomiting approximately 2 days prior to the pulmonary consult on 12/9.  Apparently she is been having some increased work of breathing mostly during the morning however on 12/9 she developed rather acute increase in her work of breathing.  She has had no chest pain, she has had a wet rhonchorous cough, and has required escalating supplemental oxygen.  A chest x-ray was obtained and this demonstrated the left loculated effusion without significant change but also new patchy right greater than left pulmonary infiltrates.  Pulmonary was asked to evaluate given concern  about her acute clinical decline  SUBJECTIVE:  Feels about the same as yesterday on 5L O2  VITAL SIGNS: BP 126/90 (BP Location: Left Arm)   Pulse 63   Temp 98 F (36.7 C) (Oral)   Resp (!) 22   Ht 5\' 4"  (1.626 m)   Wt 72.9 kg (160 lb 11.5 oz)   SpO2 100%   BMI 27.59 kg/m   HEMODYNAMICS:    VENTILATOR SETTINGS:    INTAKE / OUTPUT: I/O last 3 completed shifts: In: 340 [I.V.:90; IV Piggyback:250] Out: 1370 [Urine:700; Chest Tube:670]  PHYSICAL EXAMINATION: General: Elderly female in NAD Neuro: AAOx3, non-focal HEENT: [place]/AT, PERRL, no JVD Cardiovascular: RRR, no MRG mediastinal tube unremarkable Lungs: Coarse rhonchi  bilaterally.  Abdomen: Soft nontender no organomegaly positive bowel sounds tolerating diet Musculoskeletal: normal strength and bulk Skin: Warm and dry, grossly intact  LABS:  BMET Recent Labs  Lab 10/01/2017 0330 09/16/17 0440 09/18/17 0515  NA 128* 130* 130*  K 4.9 5.2* 5.5*  CL 95* 97* 94*  CO2 26 25 27   BUN 35* 31* 42*  CREATININE 1.62* 1.42* 1.40*  GLUCOSE 125* 107* 116*    Electrolytes Recent Labs  Lab 09/27/2017 0330 09/16/17 0440 09/18/17 0515  CALCIUM 8.2* 8.4* 8.9    CBC Recent Labs  Lab 09/14/17 0330 09/16/17 0440 09/18/17 0515  WBC 8.4 6.4 7.0  HGB 8.9* 8.5* 8.8*  HCT 28.8* 27.3* 27.2*  PLT 175 192 179    Coag's No results for input(s): APTT, INR in the last 168 hours.  Sepsis Markers No results for input(s): LATICACIDVEN, PROCALCITON, O2SATVEN in the last 168 hours.  ABG Recent Labs  Lab 09/12/17 0310 09/17/17 1330  PHART 7.389 7.373  PCO2ART 51.4* 46.1  PO2ART 76.3* 74.4*    Liver Enzymes Recent Labs  Lab 09/13/17 0225  AST 32  ALT 26  ALKPHOS 60  BILITOT 0.8  ALBUMIN 2.2*    Cardiac Enzymes No results for input(s): TROPONINI, PROBNP in the last 168 hours.  Glucose No results for input(s): GLUCAP in the last 168 hours.  Imaging Ct Angio Chest Pe W Or Wo Contrast  Result Date: 09/17/2017 CLINICAL DATA:  Shortness of breath. Pulmonary embolism suspected. Squamous cell carcinoma of the lung. EXAM: CT ANGIOGRAPHY CHEST WITH CONTRAST TECHNIQUE:  Multidetector CT imaging of the chest was performed using the standard protocol during bolus administration of intravenous contrast. Multiplanar CT image reconstructions and MIPs were obtained to evaluate the vascular anatomy. CONTRAST:  136mL ISOVUE-370 IOPAMIDOL (ISOVUE-370) INJECTION 76% COMPARISON:  CT of the chest with contrast 08/22/2017 FINDINGS: Cardiovascular: The heart is enlarged. A pericardial drain is in place. Pericardial effusion is near completely resolved. Coronary artery  calcifications are present. Aortic atherosclerosis is present without aneurysm. Pulmonary artery opacification is excellent. There is no focal filling defect to suggest pulmonary embolus. Mediastinum/Nodes: An 11 mm pretracheal lymph node is present. No significant right hilar adenopathy is present. Soft tissue at the left hilum is stable. No significant mediastinal adenopathy is present. Lungs/Pleura: Consolidated airspace disease in the left lower lobe has increased. A left hydropneumothorax is present. Left-sided chest tube is in place. A small right pleural effusion has increased since prior study. Patchy right-sided airspace disease has increased since the prior exam, likely reflecting pneumonia. Upper Abdomen: Left adrenal nodules are stable. Liver is unremarkable. Cholecystectomy is noted. No other focal lesions are present. Musculoskeletal: No focal lytic or blastic lesions are present. Thoracic spine is unremarkable. Review of the MIP images confirms the above findings. IMPRESSION: 1. No pulmonary embolus. 2. New pneumothorax associated with the left pleural effusion. Hydropneumothorax. Left-sided chest tube is in place. 3. Increased consolidation in the left lower lobe superimposed on known neoplasm and chronic fibrosis. 4. Increased diffuse patchy airspace disease involving the right lung compatible with multifocal pneumonia. Metastatic neoplasm is considered less likely given the short interval time frame. 5.  Aortic Atherosclerosis (ICD10-I70.0). Critical Value/emergent results were called by telephone at the time of interpretation on 09/17/2017 at 2:46 pm to Dr. Gilford Raid , who verbally acknowledged these results. Electronically Signed   By: San Morelle M.D.   On: 09/17/2017 14:48   Dg Chest Port 1 View  Result Date: 09/18/2017 CLINICAL DATA:  Chest tube present, shortness breath EXAM: PORTABLE CHEST 1 VIEW COMPARISON:  CT chest dated 09/17/2017 chest radiograph CT. FINDINGS: Left chest  tube with known moderate left hydropneumothorax, although the small pneumothorax component from recent CT is not radiographically evident. Patchy/nodular right lower lobe opacity, new from prior chest radiograph, better evaluated on CT. Additional left lower lobe masslike opacity is obscured by the pleural effusion, but better evaluated on CT. Superimposed chronic interstitial lung disease. The heart is top-normal in size. Right chest power port terminates in the lower SVC. IMPRESSION: Moderate left hydropneumothorax, although the small pneumothorax component from recent CT is not radiographically evident. Indwelling left chest tube. Patchy/nodular right lower lobe opacity, new from prior chest radiograph, better evaluated on CT. Additional left lower lobe masslike opacity is obscured by the pleural effusion. Electronically Signed   By: Julian Hy M.D.   On: 09/18/2017 08:16     STUDIES:  CT scan 12/9 > No pulmonary embolus. New pneumothorax associated with the left pleural effusion. Hydropneumothorax. Left-sided chest tube is in place. Increased consolidation in the left lower lobe superimposed on known neoplasm and chronic fibrosis. Increased diffuse patchy airspace disease involving the right lung compatible with multifocal pneumonia. Metastatic neoplasm is considered less likely given the short interval time frame.  CULTURES: Blood culture 12/9  ANTIBIOTICS: Vanc 12/9 Cefepime 12/9  SIGNIFICANT EVENTS:   ASSESSMENT / PLAN:  Acute on chronic hypoxic respiratory failure: Suspect this is multifactorial and due to new patchy right greater than left airspace disease worrisome for aspiration event given recent vomiting, further complicated  by acute exacerbation of COPD superimposed on known chronic left effusion  Plan/rec HCAP empiric coverage PCT Scheduled bronchodilators Systemic steroids for bronchospasm BID PPI continue Follow cultures  History of non-small cell lung cancer  stage IV status post chemoradiation. Pericardial and pleural effusions, presumed malignant, but cytology so far negative.  Pneumothorax L Plan Chest tube management per CVTS Follow-up with heme onc  Mild AKI with borderline hyperkalemia -Got Lasix earlier, I am worried with pending CT angio about renal function Plan Hold diuresis for now, although ideally need to keep dry if able.  Follow chemistry  Mild anemia Plan Per primary team   Georgann Housekeeper, AGACNP-BC [facility] Pulmonology/Critical Care Pager [phone] or [phone]  09/18/2017 10:52 AM

## 2017-09-18 NOTE — Progress Notes (Signed)
Physical Therapy Treatment Patient Details Name: Renee Vance MRN: [id number] DOB: [birth year] Today's Date: 09/18/2017    History of Present Illness Pt is a 76 y.o. female admitted on 09/22/2017 for pericardial window due to pericardial effusion with chest tube placement. 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   PT Comments    Pt even more limited this session by significant dyspnea on exertion with all mobility. Per RN request for energy conservation, assisted transfer from bed to chair; pt requires minA to stand and take steps to chair with HHA. DOE 3/4 throughout, requiring 2x seated rest breaks and cues for deep breathing. Discussed new recommendation for SNF-level therapies at d/c to maximize functional mobility and independence prior to returning home; pt in agreement with this. Will continue to follow acutely.   Follow Up Recommendations  SNF;Supervision for mobility/OOB     Equipment Recommendations  Other (comment)(TBD next venue)    Recommendations for Other Services       Precautions / Restrictions Precautions Precautions: Fall Precaution Comments: Chest tubes Restrictions Weight Bearing Restrictions: No    Mobility  Bed Mobility Overal bed mobility: Needs Assistance Bed Mobility: Supine to Sit     Supine to sit: Min assist;HOB elevated     General bed mobility comments: MinA to assist trunk elevation with UE support  Transfers Overall transfer level: Needs assistance Equipment used: 2 person hand held assist Transfers: Sit to/from [facility] Sit to Stand: Min assist Stand pivot transfers: Min assist       General transfer comment: MinA to assist trunk elevation and maintain balance with transfer from bed to chair. Able to stand again with RW and min guard, dependent on pericare as pt incontinent during stand pivot  transfer  Ambulation/Gait                 Stairs            Wheelchair Mobility    Modified Rankin (Stroke Patients Only)       Balance Overall balance assessment: Needs assistance Sitting-balance support: Feet supported Sitting balance-Leahy Scale: Good     Standing balance support: Bilateral upper extremity supported;During functional activity Standing balance-Leahy Scale: Poor Standing balance comment: requires UE support                             Cognition Arousal/Alertness: Awake/alert Behavior During Therapy: Flat affect Overall Cognitive Status: Within Functional Limits for tasks assessed                                        Exercises      General Comments        Pertinent Vitals/Pain Pain Assessment: Faces Faces Pain Scale: Hurts a little bit Pain Location: Generalized Pain Descriptors / Indicators: Sore;Tiring Pain Intervention(s): Monitored during session;Limited activity within patient's tolerance    Home Living                      Prior Function            PT Goals (current goals can now be found in the care plan section) Acute Rehab PT Goals Patient Stated Goal: Return home PT Goal Formulation: With patient Time For Goal Achievement: 09/28/17 Potential to Achieve Goals: Good  Progress towards PT goals: Not progressing toward goals - comment(significant DOE)    Frequency    Min 2X/week      PT Plan Discharge plan needs to be updated;Frequency needs to be updated    Co-evaluation              AM-PAC PT "6 Clicks" Daily Activity  Outcome Measure  Difficulty turning over in bed (including adjusting bedclothes, sheets and blankets)?: A Little Difficulty moving from lying on back to sitting on the side of the bed? : A Little Difficulty sitting down on and standing up from a chair with arms (e.g., wheelchair, bedside commode, etc,.)?: Unable Help needed moving to and from a  bed to chair (including a wheelchair)?: A Little Help needed walking in hospital room?: A Lot Help needed climbing 3-5 steps with a railing? : A Lot 6 Click Score: 14    End of Session Equipment Utilized During Treatment: Oxygen Activity Tolerance: Patient limited by fatigue Patient left: in chair;with call bell/phone within reach Nurse Communication: Mobility status PT Visit Diagnosis: Other abnormalities of gait and mobility (R26.89);Muscle weakness (generalized) (M62.81)     Time: 5427-0623 PT Time Calculation (min) (ACUTE ONLY): 30 min  Charges:  $Therapeutic Activity: 23-37 mins                    G Codes:      Mabeline Caras, PT, DPT Acute Rehab Services  Pager: [place] 09/18/2017, 2:07 PM

## 2017-09-18 NOTE — Progress Notes (Signed)
OT Cancellation Note  Patient Details Name: Renee Vance MRN: [id number] DOB: 1941-04-07   Cancelled Treatment:    Reason Eval/Treat Not Completed: Patient at procedure or test/ unavailable. PA preparing to remove chest tube on my arrival. Will check back as able to progress with plan of care.   Norman Herrlich, MS OTR/L  Pager: [phone] 09/18/2017, 1:47 PM

## 2017-09-19 ENCOUNTER — Inpatient Hospital Stay (HOSPITAL_COMMUNITY): Payer: PPO

## 2017-09-19 LAB — BLOOD GAS, ARTERIAL
Acid-Base Excess: 0.7 mmol/L (ref 0.0–2.0)
Bicarbonate: 25.3 mmol/L (ref 20.0–28.0)
DRAWN BY: 270271
O2 Content: 5 L/min
O2 Saturation: 90 %
PH ART: 7.376 (ref 7.350–7.450)
Patient temperature: 98.6
pCO2 arterial: 44.1 mmHg (ref 32.0–48.0)
pO2, Arterial: 60.5 mmHg — ABNORMAL LOW (ref 83.0–108.0)

## 2017-09-19 LAB — CBC
HCT: 26 % — ABNORMAL LOW (ref 36.0–46.0)
Hemoglobin: 8.2 g/dL — ABNORMAL LOW (ref 12.0–15.0)
MCH: 30.4 pg (ref 26.0–34.0)
MCHC: 31.5 g/dL (ref 30.0–36.0)
MCV: 96.3 fL (ref 78.0–100.0)
PLATELETS: 176 10*3/uL (ref 150–400)
RBC: 2.7 MIL/uL — AB (ref 3.87–5.11)
RDW: 17.1 % — ABNORMAL HIGH (ref 11.5–15.5)
WBC: 8.2 10*3/uL (ref 4.0–10.5)

## 2017-09-19 LAB — COMPREHENSIVE METABOLIC PANEL
ALT: 38 U/L (ref 14–54)
AST: 35 U/L (ref 15–41)
Albumin: 1.8 g/dL — ABNORMAL LOW (ref 3.5–5.0)
Alkaline Phosphatase: 80 U/L (ref 38–126)
Anion gap: 8 (ref 5–15)
BUN: 55 mg/dL — AB (ref 6–20)
CHLORIDE: 94 mmol/L — AB (ref 101–111)
CO2: 26 mmol/L (ref 22–32)
CREATININE: 1.48 mg/dL — AB (ref 0.44–1.00)
Calcium: 8.6 mg/dL — ABNORMAL LOW (ref 8.9–10.3)
GFR calc non Af Amer: 33 mL/min — ABNORMAL LOW (ref >60)
GFR, EST AFRICAN AMERICAN: 38 mL/min — AB (ref >60)
Glucose, Bld: 124 mg/dL — ABNORMAL HIGH (ref 65–99)
Potassium: 5.4 mmol/L — ABNORMAL HIGH (ref 3.5–5.1)
SODIUM: 128 mmol/L — AB (ref 135–145)
Total Bilirubin: 0.8 mg/dL (ref 0.3–1.2)
Total Protein: 5.6 g/dL — ABNORMAL LOW (ref 6.5–8.1)

## 2017-09-19 LAB — VIRUS CULTURE

## 2017-09-19 MED ORDER — FUROSEMIDE 10 MG/ML IJ SOLN
40.0000 mg | Freq: Once | INTRAMUSCULAR | Status: AC
  Administered 2017-09-19: 40 mg via INTRAVENOUS
  Filled 2017-09-19: qty 4

## 2017-09-19 MED ORDER — FUROSEMIDE 10 MG/ML IJ SOLN
40.0000 mg | Freq: Once | INTRAMUSCULAR | Status: AC
  Administered 2017-09-19: 40 mg via INTRAVENOUS
  Filled 2017-09-19 (×2): qty 4

## 2017-09-19 MED ORDER — MORPHINE SULFATE (PF) 2 MG/ML IV SOLN
1.0000 mg | Freq: Once | INTRAVENOUS | Status: AC
  Administered 2017-09-19: 1 mg via INTRAVENOUS
  Filled 2017-09-19: qty 1

## 2017-09-19 MED ORDER — NALOXONE HCL 0.4 MG/ML IJ SOLN
INTRAMUSCULAR | Status: AC
  Administered 2017-09-19: 0.4 mg
  Filled 2017-09-19: qty 1

## 2017-09-19 MED ORDER — NALOXONE HCL 0.4 MG/ML IJ SOLN: 0.4000 mg | INTRAMUSCULAR | Status: DC | PRN

## 2017-09-19 MED ORDER — BISACODYL 10 MG RE SUPP
10.0000 mg | Freq: Once | RECTAL | Status: AC
  Administered 2017-09-19: 10 mg via RECTAL
  Filled 2017-09-19: qty 1

## 2017-09-19 NOTE — Progress Notes (Addendum)
[address]       ,[place] [address]             [phone]      8 Days Post-Op Procedure(s) (LRB): SUBXYPHOID PERICARDIAL WINDOW (N/A) CHEST TUBE INSERTION (Left) Subjective: Remains dyspneic with minimal activity, sats ok on 5liters  Objective: Vital signs in last 24 hours: Temp:  [97.5 F (36.4 C)-98.2 F (36.8 C)] 98.1 F (36.7 C) (12/11 0354) Pulse Rate:  [57-65] 57 (12/11 0354) Cardiac Rhythm: Normal sinus rhythm (12/11 0700) Resp:  [21-23] 23 (12/11 0354) BP: (103-133)/(59-75) 122/59 (12/11 0354) SpO2:  [95 %-100 %] 95 % (12/11 0728)  Hemodynamic parameters for last 24 hours:    Intake/Output from previous day: 12/10 0701 - 12/11 0700 In: 26.2 [I.V.:26.2] Out: 1020 [Urine:800; Chest Tube:220] Intake/Output this shift: No intake/output data recorded.  General appearance: alert, cooperative, fatigued and mild distress Heart: regular rate and rhythm Lungs: coarse ronchi Abdomen: benign Extremities: PAS in place Wound: incis healing well  Lab Results: Recent Labs    09/18/17 0515 09/19/17 0240  WBC 7.0 8.2  HGB 8.8* 8.2*  HCT 27.2* 26.0*  PLT 179 176   BMET:  Recent Labs    09/18/17 0515 09/19/17 0240  NA 130* 128*  K 5.5* 5.4*  CL 94* 94*  CO2 27 26  GLUCOSE 116* 124*  BUN 42* 55*  CREATININE 1.40* 1.48*  CALCIUM 8.9 8.6*    PT/INR: No results for input(s): LABPROT, INR in the last 72 hours. ABG    Component Value Date/Time   PHART 7.373 09/17/2017 1330   HCO3 26.2 09/17/2017 1330   TCO2 27 03/19/2010 0726   O2SAT 94.8 09/17/2017 1330   CBG (last 3)  No results for input(s): GLUCAP in the last 72 hours.  Meds Scheduled Meds: . amLODipine  5 mg Oral Daily  . aspirin EC  81 mg Oral Daily  . atorvastatin  80 mg Oral Daily  . bisacodyl  10 mg Oral Daily  . budesonide  0.5 mg Nebulization BID  . cholecalciferol  1,000 Units Oral Daily  . enoxaparin (LOVENOX) injection  30 mg Subcutaneous Q24H  .  guaiFENesin  1,200 mg Oral BID  . lactulose  30 g Oral Once  . levalbuterol  0.63 mg Nebulization Q6H  . levothyroxine  125 mcg Oral QAC breakfast  . methylPREDNISolone (SOLU-MEDROL) injection  60 mg Intravenous Q12H  . metoprolol succinate  50 mg Oral BID  . multivitamin  1 tablet Oral BID  . pantoprazole  40 mg Oral BID  . protein supplement shake  2 oz Oral Daily  . senna-docusate  1 tablet Oral QHS   Continuous Infusions: . sodium chloride 10 mL/hr at 09/18/17 0737  . ceFEPime (MAXIPIME) IV Stopped (09/18/17 1531)  . lactated ringers Stopped (10/01/2017 1215)  . potassium chloride    . vancomycin 1,000 mg (09/18/17 1615)   PRN Meds:.acetaminophen **OR** acetaminophen (TYLENOL) oral liquid 160 mg/5 mL, ipratropium-albuterol, lidocaine-prilocaine, magnesium hydroxide, ondansetron (ZOFRAN) IV, oxyCODONE, potassium chloride, sodium chloride flush, traMADol  Xrays Ct Angio Chest Pe W Or Wo Contrast  Result Date: 09/17/2017 CLINICAL DATA:  Shortness of breath. Pulmonary embolism suspected. Squamous cell carcinoma of the lung. EXAM: CT ANGIOGRAPHY CHEST WITH CONTRAST TECHNIQUE: Multidetector CT imaging of the chest was performed using the standard protocol during bolus administration of intravenous contrast. Multiplanar CT image reconstructions and MIPs were obtained to evaluate the vascular anatomy. CONTRAST:  169mL ISOVUE-370 IOPAMIDOL (ISOVUE-370) INJECTION  76% COMPARISON:  CT of the chest with contrast 08/22/2017 FINDINGS: Cardiovascular: The heart is enlarged. A pericardial drain is in place. Pericardial effusion is near completely resolved. Coronary artery calcifications are present. Aortic atherosclerosis is present without aneurysm. Pulmonary artery opacification is excellent. There is no focal filling defect to suggest pulmonary embolus. Mediastinum/Nodes: An 11 mm pretracheal lymph node is present. No significant right hilar adenopathy is present. Soft tissue at the left hilum is stable.  No significant mediastinal adenopathy is present. Lungs/Pleura: Consolidated airspace disease in the left lower lobe has increased. A left hydropneumothorax is present. Left-sided chest tube is in place. A small right pleural effusion has increased since prior study. Patchy right-sided airspace disease has increased since the prior exam, likely reflecting pneumonia. Upper Abdomen: Left adrenal nodules are stable. Liver is unremarkable. Cholecystectomy is noted. No other focal lesions are present. Musculoskeletal: No focal lytic or blastic lesions are present. Thoracic spine is unremarkable. Review of the MIP images confirms the above findings. IMPRESSION: 1. No pulmonary embolus. 2. New pneumothorax associated with the left pleural effusion. Hydropneumothorax. Left-sided chest tube is in place. 3. Increased consolidation in the left lower lobe superimposed on known neoplasm and chronic fibrosis. 4. Increased diffuse patchy airspace disease involving the right lung compatible with multifocal pneumonia. Metastatic neoplasm is considered less likely given the short interval time frame. 5.  Aortic Atherosclerosis (ICD10-I70.0). Critical Value/emergent results were called by telephone at the time of interpretation on 09/17/2017 at 2:46 pm to Dr. Gilford Raid , who verbally acknowledged these results. Electronically Signed   By: San Morelle M.D.   On: 09/17/2017 14:48   Dg Chest Port 1 View  Result Date: 09/19/2017 CLINICAL DATA:  Shortness of Breath EXAM: PORTABLE CHEST 1 VIEW COMPARISON:  Chest radiograph July 19, 2017 and chest CT August 22, 2017 FINDINGS: Chest tube remains on the left. Port-A-Cath tip is in the superior vena cava. No pneumothorax. There is extensive airspace consolidation throughout the left mid and lower lung zones with left pleural effusion. On the right, there has been partial clearing of consolidation in the right lower lobe region. There does remain a somewhat spiculated  appearing lesion in the right lower lobe region measuring 2.1 x 1.7 cm. No new opacities are evident. There is cardiomegaly with pulmonary venous hypertension. There is aortic atherosclerosis. No evident bone lesions. IMPRESSION: 1. There has been partial clearing of infiltrate from the right lower lung zone. A somewhat spiculated 2.1 x 1.7 cm opacity remains in this area. Question residual pneumonia versus underlying parenchymal neoplasm. This area warrants close surveillance. If this lesion persists without change over the next 7-10 days, correlation with chest CT, ideally with intravenous contrast, would be advisable. This area was not appreciable on CT from 1 month prior suggesting that this area represents pneumonia, although there was pneumonia in this general area on the previous study which conceivably could have obscured a small neoplasm. 2. Extensive airspace consolidation remains on the left with small left pleural effusion. Suspect pneumonia throughout much of the left lung. Chest tube present on the left. No pneumothorax evident. 3. Patchy airspace opacity remains right upper lobe and lateral left base. 4.  Stable cardiac prominence with pulmonary venous hypertension. 5.  There is aortic atherosclerosis. Aortic Atherosclerosis (ICD10-I70.0). Electronically Signed   By: Lowella Grip III M.D.   On: 09/19/2017 07:39   Dg Chest Port 1 View  Result Date: 09/18/2017 CLINICAL DATA:  Chest tube present, shortness breath EXAM: PORTABLE CHEST 1  VIEW COMPARISON:  CT chest dated 09/17/2017 chest radiograph CT. FINDINGS: Left chest tube with known moderate left hydropneumothorax, although the small pneumothorax component from recent CT is not radiographically evident. Patchy/nodular right lower lobe opacity, new from prior chest radiograph, better evaluated on CT. Additional left lower lobe masslike opacity is obscured by the pleural effusion, but better evaluated on CT. Superimposed chronic interstitial  lung disease. The heart is top-normal in size. Right chest power port terminates in the lower SVC. IMPRESSION: Moderate left hydropneumothorax, although the small pneumothorax component from recent CT is not radiographically evident. Indwelling left chest tube. Patchy/nodular right lower lobe opacity, new from prior chest radiograph, better evaluated on CT. Additional left lower lobe masslike opacity is obscured by the pleural effusion. Electronically Signed   By: Julian Hy M.D.   On: 09/18/2017 08:16   Dg Chest Port 1 View  Result Date: 09/17/2017 CLINICAL DATA:  Shortness of breath.  Follow-up exam. EXAM: PORTABLE CHEST 1 VIEW COMPARISON:  09/16/2017 FINDINGS: Left lung consolidation is unchanged from the previous day's exam. Peripheral coarse reticular opacities in the right lung are similar to prior exams consistent with fibrosis. No new lung abnormalities. Left chest tube is stable. No pneumothorax. IMPRESSION: 1. No significant change from previous day's study. 2. Persistent left mid to lower lung zone consolidation superimposed on changes of interstitial fibrosis. 3. Stable left chest tube.  No pneumothorax. Electronically Signed   By: Lajean Manes M.D.   On: 09/17/2017 09:17    Results for orders placed or performed during the hospital encounter of 10/09/2017  Fungus Culture With Stain     Status: None (Preliminary result)   Collection Time: 10/09/2017 12:02 PM  Result Value Ref Range Status   Fungus Stain Final report  Final    Comment: (NOTE) Performed At: [facility] [place], [place] [id number] Rush Farmer MD [id number]    Fungus (Mycology) Culture PENDING  Incomplete   Fungal Source FLUID  Final    Comment: PERICARDIAL  Acid Fast Smear (AFB)     Status: None   Collection Time: 10/03/2017 12:02 PM  Result Value Ref Range Status   AFB Specimen Processing Concentration  Final   Acid Fast Smear Negative  Final    Comment: (NOTE) Performed At: [facility] [place], [place] [id number] Rush Farmer MD DJ:[id number]    Source (AFB) FLUID  Final    Comment: PERICARDIAL  Culture, body fluid-bottle     Status: None   Collection Time: 09/25/2017 12:02 PM  Result Value Ref Range Status   Specimen Description FLUID PERICARDIAL  Final   Special Requests NONE  Final   Culture NO GROWTH 5 DAYS  Final   Report Status 09/16/2017 FINAL  Final  Gram stain     Status: None   Collection Time: 09/17/2017 12:02 PM  Result Value Ref Range Status   Specimen Description FLUID PERICARDIAL  Final   Special Requests NONE  Final   Gram Stain NO WBC SEEN NO ORGANISMS SEEN   Final   Report Status 10/04/2017 FINAL  Final  Fungus Culture Result     Status: None   Collection Time: 09/30/2017 12:02 PM  Result Value Ref Range Status   Result 1 Comment  Final    Comment: (NOTE) KOH/Calcofluor preparation:  no fungus observed. Performed At: [facility] [place], [place] [id number] Rush Farmer MD OI:[id number]   Fungus Culture With Stain     Status: None (  Preliminary result)   Collection Time: 09/24/2017 12:05 PM  Result Value Ref Range Status   Fungus Stain Final report  Final    Comment: (NOTE) Performed At: [facility] [place], [place] [id number] Rush Farmer MD [id number]    Fungus (Mycology) Culture PENDING  Incomplete   Fungal Source TISSUE  Final    Comment: PERICARDIUM  Aerobic/Anaerobic Culture (surgical/deep wound)     Status: None   Collection Time: 09/14/2017 12:05 PM  Result Value Ref Range Status   Specimen Description TISSUE  Final   Special Requests PERICARDIUM  Final   Gram Stain   Final    RARE WBC PRESENT, PREDOMINANTLY PMN NO ORGANISMS SEEN    Culture No growth aerobically or anaerobically.  Final   Report Status 09/16/2017 FINAL  Final  Acid Fast Smear (AFB)     Status: None   Collection Time: 09/27/2017 12:05 PM  Result Value Ref Range Status   AFB  Specimen Processing Comment  Final    Comment: Tissue Grinding and Digestion/Decontamination   Acid Fast Smear Negative  Final    Comment: (NOTE) Performed At: [facility] [place], [place] [id number] Rush Farmer MD OE:[id number]    Source (AFB) TISSUE  Final    Comment: PERICARDIUM  Virus culture     Status: None   Collection Time: 09/24/2017 12:05 PM  Result Value Ref Range Status   Viral Culture Comment  Final    Comment: (NOTE) Preliminary Report: No virus isolated at 4 days.  Next report to follow after 7 days. Performed At: [facility] [place], [place] [id number] Rush Farmer MD [id number]    Source of Sample TISSUE  Final    Comment: PERICARDIUM  Fungus Culture Result     Status: None   Collection Time: 09/13/2017 12:05 PM  Result Value Ref Range Status   Result 1 Comment  Final    Comment: (NOTE) KOH/Calcofluor preparation:  no fungus observed. Performed At: [facility] [place], [place] [id number] Rush Farmer MD [id number]   Culture, blood (Routine X 2) w Reflex to ID Panel     Status: None (Preliminary result)   Collection Time: 09/17/17  4:13 PM  Result Value Ref Range Status   Specimen Description BLOOD LEFT HAND  Final   Special Requests IN PEDIATRIC BOTTLE Blood Culture adequate volume  Final   Culture NO GROWTH < 24 HOURS  Final   Report Status PENDING  Incomplete  Culture, blood (Routine X 2) w Reflex to ID Panel     Status: None (Preliminary result)   Collection Time: 09/17/17  4:18 PM  Result Value Ref Range Status   Specimen Description BLOOD RIGHT HAND  Final   Special Requests IN PEDIATRIC BOTTLE Blood Culture adequate volume  Final   Culture NO GROWTH < 24 HOURS  Final   Report Status PENDING  Incomplete    Assessment/Plan: S/P Procedure(s) (LRB): SUBXYPHOID PERICARDIAL WINDOW (N/A) CHEST TUBE INSERTION (Left)  1 stable but pulm status is significantly impaired,  remains very weak- conts aggressive pulm management/current ABX- CCM assisting with management 2 hyponatremia is relatively stable, K+ 5.4 today 3 creat up slightly , monitor 4 still with mod pleural drainage- 200 cc yesterday, cont to monitor with tube, she probably wouldn't tolerate Talc well with current resp status 5 may need to consider palliative care route  LOS: 8 days    John Giovanni 09/19/2017 Patient seen and examined, agree  with above She remains dyspneic despite CXR showing improvement in right base. Still denise consolidation in left base Will keep CT in another day due to moderate output Continue solumedrol, vanco and maxipime  Franchesca Veneziano C. Roxan Hockey, MD [facility] [phone]

## 2017-09-19 NOTE — Progress Notes (Signed)
PCCM Interval Progress Note  Called to bedside by [facility] MD to assess pt for respiratory distress.    Per RN, pt had received oxycodone earlier and later became somnolent and minimally responsive.  She was subsequently given narcan 0.4mg  with good improvement in mental status; however, she then had vomiting episode and likely aspirated.    Shortly thereafter, RR ranging anywhere from 16 to high 30's and pt with mildly increased WOB.  O2 requirements have remained the same at 5L via [place].  Dr. Roxy Manns with primary team notified.  He informed RN staff to move pt to ICU for intubation.  ABG from 2040 normal (7.37 / 44 / 60).  CXR with worsening right sided opacity and small left effusion.  I/O currently net + 975ml since admit.  On exam, pt with mild increase in WOB with pursed lip breathing (per RN she has been doing this throughout the night).  Lungs coarse with crackles bilaterally.  Heart RRR.  Neuro intact, no deficits.  Pt awake and able to answer all questions appropriately.  I reviewed chart and per Dr. Bari Mantis note from earlier, he had discussed code status with husband and had recommended DNR if primary service was in agreement.  No change in code status in system; therefore, assume that primary service hadn't been by again to discuss further.  I called pt's husband, Jenevieve Kirschbaum to update him and discuss goals of care.  He did tell me that Dr. Elsworth Soho spoke with him earlier and had recommended DNR.  He partially agreed; however, wasn't able to definitively tell me to change code status.  He requested to wait until he arrived at hospital within the next hour.  I advised him to not rush and drive carefully in the winter weather.  In the meantime, will administer 40mg  lasix and 1mg  morphine and assess response.  If no improvement, then will discuss goals of care further tonight.  Otherwise, primary team to please have discussions with pt and family in AM and update code status accordingly.   Montey Hora, [facility] Pager: [phone]  or [phone] 09/20/2017, 12:00 AM

## 2017-09-19 NOTE — Progress Notes (Addendum)
Upon entering the room the pt was very lethargic and confused, pupils dilated. Patient was pursed lip breathing as she has been in my pervious assessments. After assessing the patients orientation and neuro status rapid response was called to bedside. Lungs sounds revealed ronchi w/ wheezing. PRN breathing treatment given without improvement.  Pt received 10mg  of Oxycodone in pervious shift around 1812. Dr. Roxy Manns @ 2237 paged and recommended Bipap and narcan. Unable to initiate bipap due to emesis post narcan. ABG done pH: 7.376, CO2: 44.1, pO2: 60.5 HCO3: 25.3. Chest xray done Pt RR 30-40s at this time and complains of not being able to catch her breathe. Dr. Ricard Dillon paged again at 2314. He requested someone from [facility] come see patient at bedside. Critical care doctor to the bedside to assess patient and placed orders for 40mg  of lasix and .5 mg of morphine for work of breathing. After administering lasix and morphine pt RR now ranging from 15-30. No subsequent emesis at this time. Critical care contacted pt husband who is now on his way to hospital. Patient now resting, will continue to monitor.

## 2017-09-19 NOTE — Progress Notes (Signed)
PULMONARY / CRITICAL CARE MEDICINE   Name: Renee Vance MRN: [id number] DOB: 1941/03/22    ADMISSION DATE:  09/29/2017 CONSULTATION DATE: 12/9  REFERRING MD: Dr. Cyndia Bent  CHIEF COMPLAINT: Acute on chronic respiratory failure  HISTORY OF PRESENT ILLNESS:   This is a 76 year old female patient followed by Dr. Elsworth Soho in the outpatient setting with a significant history of COPD (last FEV1 74% predicted; with 8% improvement from [place]) non-small cell lung cancer, stage IV, with recurrent pericardial effusion and now with left pleural effusion now status post pericardial window and left chest tube placement on 12/3.  Her postoperative course has been somewhat unremarkable with the exception of nausea and vomiting approximately 2 days prior to the pulmonary consult on 12/9.  Apparently she is been having some increased work of breathing mostly during the morning however on 12/9 she developed rather acute increase in her work of breathing.  She has had no chest pain, she has had a wet rhonchorous cough, and has required escalating supplemental oxygen.  A chest x-ray was obtained and this demonstrated the left loculated effusion without significant change but also new patchy right greater than left pulmonary infiltrates.  Pulmonary was asked to evaluate given concern  about her acute clinical decline  SUBJECTIVE:  Patient reports walked to the door in her room 2 hours ago and became very dyspneic/fatigued, desaturated to the low 70's and still has yet to feel like she has recovered.   Remains on 5L O2 with stats at rest > 95%.    VITAL SIGNS: BP 100/71 (BP Location: Left Arm)   Pulse (!) 59   Temp 98.4 F (36.9 C) (Oral)   Resp 15   Ht 5\' 4"  (1.626 m)   Wt 160 lb 11.5 oz (72.9 kg)   SpO2 95%   BMI 27.59 kg/m   HEMODYNAMICS:    VENTILATOR SETTINGS:    INTAKE / OUTPUT: I/O last 3 completed shifts: In: 366.2 [I.V.:116.2; IV Piggyback:250] Out: 1320 [Urine:1100; Chest  Tube:220]  PHYSICAL EXAMINATION: General:  Pleasant elderly female sitting in recliner in mild distress HEENT: MM pink/moist Neuro: Intact, MAE, nonfocal CV:  rrr,  dressing dry PULM: pursed lip breathing during conversation with sats 99% on 5L, wet cough, scattered rhonchi/ rales throughout, left pleural CT  GI: soft, non-tender, bs active  Extremities: warm/dry, 2+BLE, 3+ pedal Skin: no rashes   LABS:  BMET Recent Labs  Lab 09/16/17 0440 09/18/17 0515 09/19/17 0240  NA 130* 130* 128*  K 5.2* 5.5* 5.4*  CL 97* 94* 94*  CO2 25 27 26   BUN 31* 42* 55*  CREATININE 1.42* 1.40* 1.48*  GLUCOSE 107* 116* 124*    Electrolytes Recent Labs  Lab 09/16/17 0440 09/18/17 0515 09/19/17 0240  CALCIUM 8.4* 8.9 8.6*    CBC Recent Labs  Lab 09/16/17 0440 09/18/17 0515 09/19/17 0240  WBC 6.4 7.0 8.2  HGB 8.5* 8.8* 8.2*  HCT 27.3* 27.2* 26.0*  PLT 192 179 176    Coag's No results for input(s): APTT, INR in the last 168 hours.  Sepsis Markers No results for input(s): LATICACIDVEN, PROCALCITON, O2SATVEN in the last 168 hours.  ABG Recent Labs  Lab 09/17/17 1330  PHART 7.373  PCO2ART 46.1  PO2ART 74.4*    Liver Enzymes Recent Labs  Lab 09/13/17 0225 09/19/17 0240  AST 32 35  ALT 26 38  ALKPHOS 60 80  BILITOT 0.8 0.8  ALBUMIN 2.2* 1.8*    Cardiac Enzymes No results for input(s): TROPONINI, PROBNP  in the last 168 hours.  Glucose No results for input(s): GLUCAP in the last 168 hours.  Imaging Dg Chest Port 1 View  Result Date: 09/19/2017 CLINICAL DATA:  Shortness of Breath EXAM: PORTABLE CHEST 1 VIEW COMPARISON:  Chest radiograph July 19, 2017 and chest CT August 22, 2017 FINDINGS: Chest tube remains on the left. Port-A-Cath tip is in the superior vena cava. No pneumothorax. There is extensive airspace consolidation throughout the left mid and lower lung zones with left pleural effusion. On the right, there has been partial clearing of consolidation in  the right lower lobe region. There does remain a somewhat spiculated appearing lesion in the right lower lobe region measuring 2.1 x 1.7 cm. No new opacities are evident. There is cardiomegaly with pulmonary venous hypertension. There is aortic atherosclerosis. No evident bone lesions. IMPRESSION: 1. There has been partial clearing of infiltrate from the right lower lung zone. A somewhat spiculated 2.1 x 1.7 cm opacity remains in this area. Question residual pneumonia versus underlying parenchymal neoplasm. This area warrants close surveillance. If this lesion persists without change over the next 7-10 days, correlation with chest CT, ideally with intravenous contrast, would be advisable. This area was not appreciable on CT from 1 month prior suggesting that this area represents pneumonia, although there was pneumonia in this general area on the previous study which conceivably could have obscured a small neoplasm. 2. Extensive airspace consolidation remains on the left with small left pleural effusion. Suspect pneumonia throughout much of the left lung. Chest tube present on the left. No pneumothorax evident. 3. Patchy airspace opacity remains right upper lobe and lateral left base. 4.  Stable cardiac prominence with pulmonary venous hypertension. 5.  There is aortic atherosclerosis. Aortic Atherosclerosis (ICD10-I70.0). Electronically Signed   By: Lowella Grip III M.D.   On: 09/19/2017 07:39   STUDIES:  CTA chest scan 12/9 > No pulmonary embolus. New pneumothorax associated with the left pleural effusion. Hydropneumothorax. Left-sided chest tube is in place. Increased consolidation in the left lower lobe superimposed on known neoplasm and chronic fibrosis. Increased diffuse patchy airspace disease involving the right lung compatible with multifocal pneumonia. Metastatic neoplasm is considered less likely given the short interval time frame.  CULTURES: Blood culture 12/9 >>   ANTIBIOTICS: Vanc 12/9  >>  Cefepime 12/9 >>  SIGNIFICANT EVENTS:   ASSESSMENT / PLAN:  Acute on chronic hypoxic respiratory failure: Suspect this is multifactorial and due to new patchy right greater than left airspace disease worrisome for aspiration event given recent vomiting, further complicated by acute exacerbation of COPD superimposed on known chronic left effusion, possible ILD? Plan/rec Continue HCAP empiric coverage with vanc and cefepime for now Wean O2 as able (baseline is up to 4L at home) Continue scheduled bronchodilators Continue solumedrol 60 mg q 12 BID PPI continue Goal is net even I/O balance, gentle diuresis today, net +1L today, with lasix 40mg  x 1 w/close sCr monitoring Pulmonary hygiene, appreciate PT efforts CXR in am  History of non-small cell lung cancer stage IV status post chemoradiation. Pericardial and pleural effusions, presumed malignant, but cytology so far negative.  Pneumothorax L Plan Chest tube management per CVTS Follow-up with heme onc Pericardial cultures from 12/3 neg  CXR in am  Mild AKI with borderline hyperkalemia -high risk for worsening AKI given CTA and duresis. Plan Even balance as possible Trend  BMET  Mild anemia Plan Per primary team   Kennieth Rad, AGACNP-BC [facility] Pgr: [phone] or  if no answer [phone] 09/19/2017, 11:14 AM

## 2017-09-19 NOTE — Plan of Care (Signed)
Continue current care plan 

## 2017-09-19 NOTE — Progress Notes (Addendum)
Occupational Therapy Treatment Patient Details Name: Renee Vance MRN: [id number] DOB: 10-22-1940 Today's Date: 09/19/2017    History of present illness Pt is a 76 y.o. female admitted on 09/22/2017 for pericardial window due to pericardial effusion with chest tube placement. 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   OT comments  Pt limited this session by decreased activity tolerance for ADL and increased weakness. Pt on BSC on OT arrival. She required initially min assist to stand for pericare with RN providing total assistance for hygiene. However, pt quickly becoming weak requiring up to mod assist +2 for stand-pivot transfer on return to bed. Pt very willing and motivated to participate this session despite fatigue and abdominal pain. Educated pt concerning HEP for B UE and LE including quad-sets, ankle pumps, tricep presses, and shoulder forward elevation and will continue to update next session. She is demonstrating significant functional decline from previous level of participation and updated D/C recommendation to short-term SNF level rehabilitation to maximize return to independence post-acute D/C. OT will continue to follow while admitted.    Follow Up Recommendations  SNF    Equipment Recommendations  Other (comment)(TBD at next venue of care)    Recommendations for Other Services      Precautions / Restrictions Precautions Precautions: Fall Precaution Comments: Chest tube (Left) Restrictions Weight Bearing Restrictions: No       Mobility Bed Mobility Overal bed mobility: Needs Assistance Bed Mobility: Sit to Supine       Sit to supine: Mod assist   General bed mobility comments: Assist to bring B LE into bed.   Transfers Overall transfer level: Needs assistance Equipment used: 2 person hand held assist Transfers: Sit to/from [facility] Sit to  Stand: Min assist;Mod assist Stand pivot transfers: Mod assist;+2 physical assistance       General transfer comment: Min assist initially to power up from chair with face to face technique. Pt requiring seated rest break prior to standing again for stand-pivot to chair from [facility]. Moved recliner closer to bed and completed a second stand-pivot transfer to bed with assist from RN to guide hips to bed due to instability.    Balance Overall balance assessment: Needs assistance Sitting-balance support: Feet supported Sitting balance-Leahy Scale: Good     Standing balance support: Bilateral upper extremity supported;During functional activity Standing balance-Leahy Scale: Poor Standing balance comment: requires UE support                            ADL either performed or assessed with clinical judgement   ADL Overall ADL's : Needs assistance/impaired Eating/Feeding: Independent                       Toilet Transfer: Moderate assistance;+2 for safety/equipment;BSC;Stand-pivot Toilet Transfer Details (indicate cue type and reason): Face to face technique. Initially min assist to rise to standing. Requiring mod assist to pivot and maintain standing due to B knee buckling. Pt "gives out" quickly and required 2 person assist to return to bed due to instability.  Toileting- Clothing Manipulation and Hygiene: Maximal assistance;Sit to/from stand       Functional mobility during ADLs: Moderate assistance(stand-pivot only today) General ADL Comments: Pt limited this session by fatigue, knee buckling, and decreased activity tolerance.      Vision       Perception  Praxis      Cognition Arousal/Alertness: Awake/alert Behavior During Therapy: Flat affect;WFL for tasks assessed/performed(smiling some) Overall Cognitive Status: Within Functional Limits for tasks assessed                                          Exercises     Shoulder  Instructions       General Comments On 6L O2 via [place] throughout session.     Pertinent Vitals/ Pain       Pain Assessment: Faces Faces Pain Scale: Hurts little more Pain Location: Generalized Pain Descriptors / Indicators: Sore;Tiring Pain Intervention(s): Monitored during session;Limited activity within patient's tolerance;Repositioned  Home Living                                          Prior Functioning/Environment              Frequency  Min 2X/week        Progress Toward Goals  OT Goals(current goals can now be found in the care plan section)  Progress towards OT goals: Not progressing toward goals - comment(decreased activity tolerance)  Acute Rehab OT Goals Patient Stated Goal: Return home OT Goal Formulation: With patient Time For Goal Achievement: 09/27/17 Potential to Achieve Goals: Good  Plan Discharge plan needs to be updated;Frequency remains appropriate    Co-evaluation                 AM-PAC PT "6 Clicks" Daily Activity     Outcome Measure   Help from another person eating meals?: A Little Help from another person taking care of personal grooming?: A Little Help from another person toileting, which includes using toliet, bedpan, or urinal?: A Lot Help from another person bathing (including washing, rinsing, drying)?: A Lot Help from another person to put on and taking off regular upper body clothing?: A Little Help from another person to put on and taking off regular lower body clothing?: Total 6 Click Score: 14    End of Session Equipment Utilized During Treatment: Oxygen  OT Visit Diagnosis: Unsteadiness on feet (R26.81);Muscle weakness (generalized) (M62.81)   Activity Tolerance Patient limited by fatigue   Patient Left in chair;with call bell/phone within reach   Nurse Communication Mobility status        Time: 5056-9794 OT Time Calculation (min): 18 min  Charges: OT General Charges $OT Visit: 1  Visit OT Treatments $Self Care/Home Management : 8-22 mins  Norman Herrlich, MS OTR/L  Pager: [phone] 09/19/2017, 2:06 PM

## 2017-09-19 NOTE — Care Management Note (Signed)
Case Management Note  Patient Details  Name: BILLI BRIGHT MRN: [id number] Date of Birth: [birth year]  Subjective/Objective:       Pt is s/p pericardial window - CT insertion             Action/Plan:   PTA independent from home with husband.  CM offered choice for [facility] and DME as recommended - pt chose [facility] - agency contacted and referral accepted   Expected Discharge Date:                  Expected Discharge Plan:  [place]  In-House Referral:     Discharge planning Services  CM Consult  Post Acute Care Choice:  Durable Medical Equipment Choice offered to:  Patient  DME Arranged:  3-N-1, [place] rolling DME Agency:  [place]:  PT, OT [facility] Agency:  [place]  Status of Service:  In process, will continue to follow  If discussed at Long Length of Stay Meetings, dates discussed:    Additional Comments: 09/19/2017 Discussed in LOS 12/11 - pt remains appropriate for continued stay.  Pt is now recommended for SNF - CSW consulted.   Maryclare Labrador, RN 09/19/2017, 3:21 PM

## 2017-09-20 ENCOUNTER — Ambulatory Visit: Payer: PPO | Admitting: Pediatrics

## 2017-09-20 ENCOUNTER — Inpatient Hospital Stay (HOSPITAL_COMMUNITY): Payer: PPO

## 2017-09-20 MED ORDER — MORPHINE SULFATE (PF) 2 MG/ML IV SOLN
1.0000 mg | INTRAVENOUS | Status: DC | PRN
  Administered 2017-09-20 (×3): 1 mg via INTRAVENOUS
  Filled 2017-09-20 (×2): qty 1

## 2017-09-20 MED ORDER — OXYCODONE HCL 5 MG PO TABS
5.0000 mg | ORAL_TABLET | ORAL | Status: DC | PRN
  Administered 2017-09-22: 5 mg via ORAL
  Filled 2017-09-20: qty 1

## 2017-09-20 MED ORDER — FUROSEMIDE 10 MG/ML IJ SOLN: 40.0000 mg | Freq: Two times a day (BID) | INTRAMUSCULAR | Status: DC

## 2017-09-20 MED ORDER — FUROSEMIDE 10 MG/ML IJ SOLN
80.0000 mg | Freq: Two times a day (BID) | INTRAMUSCULAR | Status: DC
  Administered 2017-09-20 – 2017-09-21 (×3): 80 mg via INTRAVENOUS
  Filled 2017-09-20 (×3): qty 8

## 2017-09-20 NOTE — Progress Notes (Addendum)
[address]       [employer] [address]             [phone]      9 Days Post-Op Procedure(s) (LRB): SUBXYPHOID PERICARDIAL WINDOW (N/A) CHEST TUBE INSERTION (Left) Subjective: Purse lips breathing, dyspneic at rest, sats ok, CXR stable in appearance of infiltrates  Objective: Vital signs in last 24 hours: Temp:  [97.4 F (36.3 C)-98.4 F (36.9 C)] 97.5 F (36.4 C) (12/12 0708) Pulse Rate:  [60-61] 60 (12/12 0708) Cardiac Rhythm: Normal sinus rhythm (12/12 0708) Resp:  [17-27] 27 (12/12 0708) BP: (103-156)/(52-96) 156/96 (12/12 0708) SpO2:  [95 %-99 %] 96 % (12/12 0750)  Hemodynamic parameters for last 24 hours:    Intake/Output from previous day: 12/11 0701 - 12/12 0700 In: 963.8 [P.O.:120; I.V.:343.8; IV Piggyback:500] Out: 500 [Urine:400; Chest Tube:100] Intake/Output this shift: No intake/output data recorded.  General appearance: alert, distracted, fatigued and moderate distress Heart: regular rate and rhythm Lungs: coarse ronchi Abdomen: benign Extremities: No calf tenderness Wound: incis ok, healing well  Lab Results: Recent Labs    09/18/17 0515 09/19/17 0240  WBC 7.0 8.2  HGB 8.8* 8.2*  HCT 27.2* 26.0*  PLT 179 176   BMET:  Recent Labs    09/18/17 0515 09/19/17 0240  NA 130* 128*  K 5.5* 5.4*  CL 94* 94*  CO2 27 26  GLUCOSE 116* 124*  BUN 42* 55*  CREATININE 1.40* 1.48*  CALCIUM 8.9 8.6*    PT/INR: No results for input(s): LABPROT, INR in the last 72 hours. ABG    Component Value Date/Time   PHART 7.376 09/19/2017 2040   HCO3 25.3 09/19/2017 2040   TCO2 27 03/19/2010 0726   O2SAT 90.0 09/19/2017 2040   CBG (last 3)  No results for input(s): GLUCAP in the last 72 hours.  Meds Scheduled Meds: . amLODipine  5 mg Oral Daily  . aspirin EC  81 mg Oral Daily  . atorvastatin  80 mg Oral Daily  . bisacodyl  10 mg Oral Daily  . budesonide  0.5 mg Nebulization BID  . cholecalciferol  1,000 Units Oral Daily  .  enoxaparin (LOVENOX) injection  30 mg Subcutaneous Q24H  . guaiFENesin  1,200 mg Oral BID  . levalbuterol  0.63 mg Nebulization Q6H  . levothyroxine  125 mcg Oral QAC breakfast  . methylPREDNISolone (SOLU-MEDROL) injection  60 mg Intravenous Q12H  . metoprolol succinate  50 mg Oral BID  . multivitamin  1 tablet Oral BID  . pantoprazole  40 mg Oral BID  . protein supplement shake  2 oz Oral Daily  . senna-docusate  1 tablet Oral QHS   Continuous Infusions: . sodium chloride 10 mL/hr at 09/20/17 0028  . ceFEPime (MAXIPIME) IV Stopped (09/19/17 1616)  . lactated ringers Stopped (09/18/2017 1215)  . potassium chloride    . vancomycin Stopped (09/19/17 1646)   PRN Meds:.acetaminophen **OR** acetaminophen (TYLENOL) oral liquid 160 mg/5 mL, ipratropium-albuterol, lidocaine-prilocaine, magnesium hydroxide, naloxone, ondansetron (ZOFRAN) IV, oxyCODONE, potassium chloride, sodium chloride flush, traMADol  Xrays Dg Chest Port 1 View  Result Date: 09/20/2017 CLINICAL DATA:  Shortness of breath. EXAM: PORTABLE CHEST 1 VIEW COMPARISON:  Radiograph of September 19, 2017. FINDINGS: Stable cardiomegaly. No pneumothorax is noted. Right internal jugular Port-A-Cath is unchanged in position. Stable bilateral lung opacities are noted concerning for pneumonia with stable probable loculated left pleural effusion. Left-sided chest tube is unchanged in position. Bony thorax is unremarkable. IMPRESSION: Stable  bilateral lung opacities are noted consistent with pneumonia. Stable probable loculated left pleural effusion. Left-sided chest tube is noted and unchanged. No pneumothorax is noted. Electronically Signed   By: Marijo Conception, M.D.   On: 09/20/2017 08:00   Dg Chest Port 1 View  Result Date: 09/19/2017 CLINICAL DATA:  Acute onset of respiratory distress. EXAM: PORTABLE CHEST 1 VIEW COMPARISON:  Chest radiograph performed earlier today at 6:30 a.m. FINDINGS: Dense left-sided and worsening right-sided airspace  opacities are concerning for worsening multifocal pneumonia. A small left pleural effusion is noted. No pneumothorax is seen. The cardiomediastinal silhouette is borderline enlarged. A left-sided chest tube is unchanged in appearance. A right-sided chest port is noted ending about the mid SVC. No acute osseous abnormalities are seen. Clips are noted within the right upper quadrant, reflecting prior cholecystectomy. IMPRESSION: 1. Dense left-sided and worsening right-sided airspace opacities are concerning for worsening multifocal pneumonia. Small left pleural effusion noted. 2. Borderline cardiomegaly. Electronically Signed   By: Garald Balding M.D.   On: 09/19/2017 23:34   Dg Chest Port 1 View  Result Date: 09/19/2017 CLINICAL DATA:  Shortness of Breath EXAM: PORTABLE CHEST 1 VIEW COMPARISON:  Chest radiograph July 19, 2017 and chest CT August 22, 2017 FINDINGS: Chest tube remains on the left. Port-A-Cath tip is in the superior vena cava. No pneumothorax. There is extensive airspace consolidation throughout the left mid and lower lung zones with left pleural effusion. On the right, there has been partial clearing of consolidation in the right lower lobe region. There does remain a somewhat spiculated appearing lesion in the right lower lobe region measuring 2.1 x 1.7 cm. No new opacities are evident. There is cardiomegaly with pulmonary venous hypertension. There is aortic atherosclerosis. No evident bone lesions. IMPRESSION: 1. There has been partial clearing of infiltrate from the right lower lung zone. A somewhat spiculated 2.1 x 1.7 cm opacity remains in this area. Question residual pneumonia versus underlying parenchymal neoplasm. This area warrants close surveillance. If this lesion persists without change over the next 7-10 days, correlation with chest CT, ideally with intravenous contrast, would be advisable. This area was not appreciable on CT from 1 month prior suggesting that this area  represents pneumonia, although there was pneumonia in this general area on the previous study which conceivably could have obscured a small neoplasm. 2. Extensive airspace consolidation remains on the left with small left pleural effusion. Suspect pneumonia throughout much of the left lung. Chest tube present on the left. No pneumothorax evident. 3. Patchy airspace opacity remains right upper lobe and lateral left base. 4.  Stable cardiac prominence with pulmonary venous hypertension. 5.  There is aortic atherosclerosis. Aortic Atherosclerosis (ICD10-I70.0). Electronically Signed   By: Lowella Grip III M.D.   On: 09/19/2017 07:39   ABG    Component Value Date/Time   PHART 7.376 09/19/2017 2040   PCO2ART 44.1 09/19/2017 2040   PO2ART 60.5 (L) 09/19/2017 2040   HCO3 25.3 09/19/2017 2040   TCO2 27 03/19/2010 0726   O2SAT 90.0 09/19/2017 2040   Results for orders placed or performed during the hospital encounter of 09/14/2017  Fungus Culture With Stain     Status: None (Preliminary result)   Collection Time: 09/10/2017 12:02 PM  Result Value Ref Range Status   Fungus Stain Final report  Final    Comment: (NOTE) Performed At: [facility] [place], [place] [id number] Rush Farmer MD [id number]    Fungus (Mycology) Culture PENDING  Incomplete  Fungal Source FLUID  Final    Comment: PERICARDIAL  Acid Fast Smear (AFB)     Status: None   Collection Time: 09/25/2017 12:02 PM  Result Value Ref Range Status   AFB Specimen Processing Concentration  Final   Acid Fast Smear Negative  Final    Comment: (NOTE) Performed At: [facility] [place], [place] [id number] Rush Farmer MD [id number]    Source (AFB) FLUID  Final    Comment: PERICARDIAL  Culture, body fluid-bottle     Status: None   Collection Time: 10/04/2017 12:02 PM  Result Value Ref Range Status   Specimen Description FLUID PERICARDIAL  Final   Special Requests NONE  Final    Culture NO GROWTH 5 DAYS  Final   Report Status 09/16/2017 FINAL  Final  Gram stain     Status: None   Collection Time: 09/13/2017 12:02 PM  Result Value Ref Range Status   Specimen Description FLUID PERICARDIAL  Final   Special Requests NONE  Final   Gram Stain NO WBC SEEN NO ORGANISMS SEEN   Final   Report Status 09/16/2017 FINAL  Final  Fungus Culture Result     Status: None   Collection Time: 10/03/2017 12:02 PM  Result Value Ref Range Status   Result 1 Comment  Final    Comment: (NOTE) KOH/Calcofluor preparation:  no fungus observed. Performed At: [facility] [place], [place] [id number] Rush Farmer MD [id number]   Fungus Culture With Stain     Status: None (Preliminary result)   Collection Time: 09/26/2017 12:05 PM  Result Value Ref Range Status   Fungus Stain Final report  Final    Comment: (NOTE) Performed At: [facility] [place], [place] [id number] Rush Farmer MD [id number]    Fungus (Mycology) Culture PENDING  Incomplete   Fungal Source TISSUE  Final    Comment: PERICARDIUM  Aerobic/Anaerobic Culture (surgical/deep wound)     Status: None   Collection Time: 09/09/2017 12:05 PM  Result Value Ref Range Status   Specimen Description TISSUE  Final   Special Requests PERICARDIUM  Final   Gram Stain   Final    RARE WBC PRESENT, PREDOMINANTLY PMN NO ORGANISMS SEEN    Culture No growth aerobically or anaerobically.  Final   Report Status 09/16/2017 FINAL  Final  Acid Fast Smear (AFB)     Status: None   Collection Time: 10/04/2017 12:05 PM  Result Value Ref Range Status   AFB Specimen Processing Comment  Final    Comment: Tissue Grinding and Digestion/Decontamination   Acid Fast Smear Negative  Final    Comment: (NOTE) Performed At: [facility] [place], [place] [id number] Rush Farmer MD [id number]    Source (AFB) TISSUE  Final    Comment: PERICARDIUM  Virus culture     Status: None    Collection Time: 10/08/2017 12:05 PM  Result Value Ref Range Status   Viral Culture No virus isolated.  Corrected    Comment: (NOTE) Performed At: [facility] [place], [place] [id number] Rush Farmer MD [id number] CORRECTED ON 12/11 AT 1133: PREVIOUSLY REPORTED AS Comment    Source of Sample TISSUE  Final    Comment: PERICARDIUM  Fungus Culture Result     Status: None   Collection Time: 09/25/2017 12:05 PM  Result Value Ref Range Status   Result 1 Comment  Final    Comment: (NOTE) KOH/Calcofluor preparation:  no fungus  observed. Performed At: [facility] [place], [place] [id number] Rush Farmer MD [id number]   Culture, blood (Routine X 2) w Reflex to ID Panel     Status: None (Preliminary result)   Collection Time: 09/17/17  4:13 PM  Result Value Ref Range Status   Specimen Description BLOOD LEFT HAND  Final   Special Requests IN PEDIATRIC BOTTLE Blood Culture adequate volume  Final   Culture NO GROWTH 2 DAYS  Final   Report Status PENDING  Incomplete  Culture, blood (Routine X 2) w Reflex to ID Panel     Status: None (Preliminary result)   Collection Time: 09/17/17  4:18 PM  Result Value Ref Range Status   Specimen Description BLOOD RIGHT HAND  Final   Special Requests IN PEDIATRIC BOTTLE Blood Culture adequate volume  Final   Culture NO GROWTH 2 DAYS  Final   Report Status PENDING  Incomplete    Assessment/Plan: S/P Procedure(s) (LRB): SUBXYPHOID PERICARDIAL WINDOW (N/A) CHEST TUBE INSERTION (Left)  1 clinical condition conts to deteriorate, she is unsure if she would want to be placed on a ventillator. 2 CT drained 200 cc yesterday, 120 so far today- leave in place 3 CCM assisting with management 4 no new labs 5 try to keep as comfortable as able  LOS: 9 days    John Giovanni 09/20/2017 Patient seen and examined, agree with above Her CXR changed dramatically for the worse between yesterday AM and PM. I  suspect there is a component of pulmonary edema. She has not responded well to 40 mg of lasix- will give 80 mg BID today D/w Dr. Elsworth Soho. He has addressed code status with patient and husband. I am in agreement with aggressively trying to improve her status but no vent. Continue antibiotics, steroids, nebs, more aggressive diuresis  Remo Lipps C. Roxan Hockey, MD [facility] [phone]

## 2017-09-20 NOTE — Significant Event (Signed)
Rapid Response Event Note During rounds Kendell RN asked me to come to bedside due to patient lethargic and pursed lip breathing.    Overview: Time Called: 2230 Arrival Time: 2230 Event Type: Respiratory, Neurologic  Initial Focused Assessment: On arrival pt lying supine in bed, skin warm and dry, somnolent, difficult to arouse. Pt using accessory muscles to breathe. Lungs coarse and crackles Bilaterally. O2 sats 100% 5L [place], RR 22-35. She received oxycodone at around 1800. Narcan 0.4 mg IVP given with a positive results. Pt now alert to self, month, age but confused as to where she was and why she was at a hospital. Shortly after pt had a vomiting episodes, O2 sats dropped to 86-87% on 5L [place] . Pt now with pursed lip breathing, increased WOB with a RR 35-42. Dr. Roxy Manns notified, he advised [place] to page PCCM with the plan to move pt to ICU for intubation. Rahul NP at bedside, he called to speak with spouse regarding a previous note from Dr. Elsworth Soho regarding pt's code status. New medications given and completed. BP 103/53, HR 58  Interventions: ABG 7.37/44/60/25 CXR for likely aspiration- Dense Left sided and worsening Right sided airspace opacities concerning for PNA. Borderline cardiomegaly Duoneb treatment Lasix 40 mg IVP Morphine 1 mg IVP Plan of Care (if not transferred): Continue to monitor patient. Kendal RN encouraged to call for any concerns.  Event Summary: Name of Physician Notified: Dr. Ricard Dillon  at 2237  Name of Consulting Physician Notified: Dr. Oletta Darter  at 2321  Outcome: Stayed in room and stabalized     [place], [place]

## 2017-09-20 NOTE — Progress Notes (Signed)
76 year old ex-smoker with squamous cell carcinoma initially diagnosed 05/2016, underwent chemoradiation but unfortunately had disease progression with left adrenal metastases and again underwent radiation for this. She developed pericardial effusion and first underwent pericardiocentesis 10/29, cytology negative. When the fluid recurred she needed pericardial window which is performed 12/3, again cytology negative. Pleural effusion has been evaluated on 11/16 and 12/3 both times cytology negative She has been oxygen dependent since an episode of influenza in 10/2016  Unfortunately her course was complicated by left hydropneumothorax and increasing infiltrates and left lower lobe consolidation. She is being treated with cefepime and vancomycin for HCAP.  She had acute respiratory distress last night after receiving oxycodone and may have aspirated after vomiting.  Chest x-ray looks worse on the right today, had shown some clearing on x-ray from 12/11, left consolidation/effusion noted with chest tube and remains the same.  She remains dyspneic although saturations stable on 6 L nasal cannula, husband at the bedside. Exam-decreased breath sounds on left, no air leak, coarse breath sounds on right, 1+ edema, no JVD.  Labs show mild hyponatremia and hyperkalemia with chronic anemia and no leukocytosis   Significant tests/ events reviewed  PFT's 06/29/16 FEV1 1.50 (74 % ) ratio 73 p 8 % improvement from saba p ? 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 On review of prior CT scans, ILD seems to date back to 05/2016 andwas noted to be worse on office visit 07/2017 and she was started on 20 mg of prednisone since 06/2017   Recommend- Continue broad-spectrum antibiotics. Chest tube management per surgery. Agree with IV Lasix  Based on conversation with  husband and primary service, will place DNR order-would continue aggressive medical therapy including BiPAP but no ventilator.  Due to her severe dyspnea would like to use low-dose opiate, but hesitant given her episode with oxycodone yesterday.  Will use low-dose morphine instead and see how she responds to 1 mg  Tedi Hughson V. Elsworth Soho MD

## 2017-09-21 ENCOUNTER — Inpatient Hospital Stay (HOSPITAL_COMMUNITY): Payer: PPO

## 2017-09-21 DIAGNOSIS — Z66 Do not resuscitate: Secondary | ICD-10-CM

## 2017-09-21 DIAGNOSIS — C3492 Malignant neoplasm of unspecified part of left bronchus or lung: Secondary | ICD-10-CM

## 2017-09-21 DIAGNOSIS — R0600 Dyspnea, unspecified: Secondary | ICD-10-CM

## 2017-09-21 DIAGNOSIS — N179 Acute kidney failure, unspecified: Secondary | ICD-10-CM

## 2017-09-21 DIAGNOSIS — R0609 Other forms of dyspnea: Secondary | ICD-10-CM

## 2017-09-21 DIAGNOSIS — Z515 Encounter for palliative care: Secondary | ICD-10-CM

## 2017-09-21 LAB — BASIC METABOLIC PANEL
Anion gap: 8 (ref 5–15)
BUN: 77 mg/dL — AB (ref 6–20)
CALCIUM: 8.2 mg/dL — AB (ref 8.9–10.3)
CO2: 28 mmol/L (ref 22–32)
Chloride: 92 mmol/L — ABNORMAL LOW (ref 101–111)
Creatinine, Ser: 2.07 mg/dL — ABNORMAL HIGH (ref 0.44–1.00)
GFR calc Af Amer: 25 mL/min — ABNORMAL LOW (ref >60)
GFR, EST NON AFRICAN AMERICAN: 22 mL/min — AB (ref >60)
GLUCOSE: 121 mg/dL — AB (ref 65–99)
Potassium: 5.1 mmol/L (ref 3.5–5.1)
Sodium: 128 mmol/L — ABNORMAL LOW (ref 135–145)

## 2017-09-21 LAB — CBC
HEMATOCRIT: 24.8 % — AB (ref 36.0–46.0)
Hemoglobin: 7.8 g/dL — ABNORMAL LOW (ref 12.0–15.0)
MCH: 30.1 pg (ref 26.0–34.0)
MCHC: 31.5 g/dL (ref 30.0–36.0)
MCV: 95.8 fL (ref 78.0–100.0)
Platelets: 143 10*3/uL — ABNORMAL LOW (ref 150–400)
RBC: 2.59 MIL/uL — ABNORMAL LOW (ref 3.87–5.11)
RDW: 17.2 % — AB (ref 11.5–15.5)
WBC: 12.8 10*3/uL — ABNORMAL HIGH (ref 4.0–10.5)

## 2017-09-21 LAB — VANCOMYCIN, TROUGH: VANCOMYCIN TR: 35 ug/mL — AB (ref 15–20)

## 2017-09-21 MED ORDER — MORPHINE SULFATE (CONCENTRATE) 10 MG/0.5ML PO SOLN
5.0000 mg | ORAL | Status: DC | PRN
  Administered 2017-09-21 (×3): 5 mg via ORAL
  Filled 2017-09-21 (×3): qty 0.5

## 2017-09-21 MED ORDER — FUROSEMIDE 10 MG/ML IJ SOLN: 80.0000 mg | Freq: Every day | INTRAMUSCULAR | Status: DC

## 2017-09-21 NOTE — Progress Notes (Signed)
Physical Therapy Treatment Patient Details Name: Renee Vance MRN: [id number] DOB: [birth year] Today's Date: 09/21/2017    History of Present Illness Pt is a 76 y.o. female admitted on 09/25/2017 for pericardial window due to pericardial effusion with chest tube placement. 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   PT Comments    Pt declining OOB mobility today due to significant fatigue and SOB; SpO2 91-93% on 5L O2 [place]. Max education on importance of continued mobility within pt's tolerance. Willing to participate in supine and seated BLE therex. Pt easily fatigued with exercise, requiring multiple rest breaks secondary to DOE 3/4; SpO2 down to 88%. HEP handout provided; husband present and educated as well. Encouraged to perform supine therex as able with assist as needed from husband. Will continue to follow acutely.   Follow Up Recommendations  SNF;Supervision for mobility/OOB     Equipment Recommendations  Other (comment)(TBD next venue)    Recommendations for Other Services       Precautions / Restrictions Precautions Precautions: Fall Precaution Comments: Watch SpO2 Restrictions Weight Bearing Restrictions: No    Mobility  Bed Mobility Overal bed mobility: Needs Assistance Bed Mobility: Supine to Sit;Sit to Supine     Supine to sit: Mod assist;HOB elevated Sit to supine: Mod assist   General bed mobility comments: ModA for UE support to assist trunk elevation and scoot hips to EOB; pt required max encouragement to do as much as she could. Significant increased time and effort due to fatigue, weakness, and SOB  Transfers                 General transfer comment: Pt declined  Ambulation/Gait                 Stairs            Wheelchair Mobility    Modified Rankin (Stroke Patients Only)       Balance Overall balance assessment: Needs  assistance Sitting-balance support: Feet unsupported;No upper extremity supported Sitting balance-Leahy Scale: Fair Sitting balance - Comments: Supervision for seated EOB therex                                    Cognition Arousal/Alertness: Awake/alert Behavior During Therapy: Anxious Overall Cognitive Status: Within Functional Limits for tasks assessed                                 General Comments: Anxious regarding difficulty breathing, required frequent encouragement and education      Exercises General Exercises - Lower Extremity Ankle Circles/Pumps: AROM;Both;10 reps;Supine Quad Sets: AROM;Both;10 reps;Supine Gluteal Sets: AROM;Both;5 reps;Supine Short Arc Quad: AROM;Both;10 reps;Supine Long Arc Quad: AROM;Both;10 reps;Seated Heel Slides: AROM;Both;10 reps;Supine Straight Leg Raises: (unable to AROM) Hip Flexion/Marching: AROM;Both;5 reps;Seated    General Comments        Pertinent Vitals/Pain Pain Assessment: Faces Faces Pain Scale: Hurts little more Pain Location: Generalized Pain Descriptors / Indicators: Sore;Tiring Pain Intervention(s): Monitored during session;Limited activity within patient's tolerance    Home Living                      Prior Function            PT Goals (current goals can now be found in the care  plan section) Acute Rehab PT Goals Patient Stated Goal: Return home PT Goal Formulation: With patient Time For Goal Achievement: 10/05/17 Potential to Achieve Goals: Fair Progress towards PT goals: Not progressing toward goals - comment(Significant SOB)    Frequency    Min 2X/week      PT Plan Current plan remains appropriate    Co-evaluation              AM-PAC PT "6 Clicks" Daily Activity  Outcome Measure  Difficulty turning over in bed (including adjusting bedclothes, sheets and blankets)?: Unable Difficulty moving from lying on back to sitting on the side of the bed? :  Unable Difficulty sitting down on and standing up from a chair with arms (e.g., wheelchair, bedside commode, etc,.)?: Unable Help needed moving to and from a bed to chair (including a wheelchair)?: A Lot Help needed walking in hospital room?: A Lot Help needed climbing 3-5 steps with a railing? : A Lot 6 Click Score: 9    End of Session Equipment Utilized During Treatment: Oxygen Activity Tolerance: Patient limited by fatigue;Treatment limited secondary to medical complications (Comment)(SOB) Patient left: in bed;with call bell/phone within reach;with family/visitor present;with bed alarm set Nurse Communication: Mobility status PT Visit Diagnosis: Other abnormalities of gait and mobility (R26.89);Muscle weakness (generalized) (M62.81)     Time: 9326-7124 PT Time Calculation (min) (ACUTE ONLY): 47 min  Charges:  $Therapeutic Exercise: 23-37 mins $Therapeutic Activity: 8-22 mins                    G Codes:      Mabeline Caras, PT, DPT Acute Rehab Services  Pager: [place] 09/21/2017, 2:48 PM

## 2017-09-21 NOTE — Progress Notes (Signed)
Chaplain Consult Note:  Received referral indicating patient was open to speaking to a chaplain.   I met with pt. And patient's husband. Pt.indicated she was having difficulty breathing and was concerned about what this meant. She deferred to her husband who talked about what they were hearing from the surgeon and pulmonologist regarding her current state and that it was going to take a long time to recover. He acknowledged her cancer dx and his hope that she would have some more time. He indicated that she was feeling down given how weak she was feeling. He also spoke of meeting with Palliative Care NP Wadie Lessen to talk about her prognosis and plan of care. He acknowledged that she was preparing them for what they may have to face and that this hit him like a " ton of bricks."   He spoke affectionately about their marriage and their family ( his children and her children ) One son was here for the [place] meeting and the other children are coming tomorrow - they live in [place].  This couple are Catholic by background though he said they have not been active in church ( " But I still talk to the man upstairs all the time.) His sense of God as a source of support he spoke to repeatedly.  They both spoke of the good care they are receiving from the nursing and medical staff. I will follow up with this couple and will also refer them to the unit chaplain.   They requested a prayer which I offered.   Wells Guiles Director Spiritual Care

## 2017-09-21 NOTE — Consult Note (Signed)
Consultation Note Date: 09/21/2017   Patient Name: Renee Vance  DOB: 01-29-1941  MRN: [id number]  Age / Sex: 76 y.o., female  PCP: [name], MD Referring Physician: Melrose Nakayama, MD  Reason for Consultation: Establishing goals of care, Non pain symptom management and Psychosocial/spiritual support  HPI/Patient Profile: 76 y.o. female   admitted on 10/04/2017  past medical history is significant for coronary artery disease with previous stenting, paroxysmal atrial fibrillation, tobacco abuse, COPD, interstitial lung disease, hyperlipidemia, and hypertension.  She was diagnosed last year with metastatic NSC  squamous cell lung carcinoma.   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  She was treated with chemo and radiation.  She had to quit chemotherapy due to side effects.  She recently was treated with radiation for her left adrenal mass.  She was having problems with worsening shortness of breath.  And also was having problems with leg swelling.  A CT back in October showed a pericardial and left pleural effusion.  She was having a pericardiocentesis on 08/07/2017 when she became tachypneic and tachycardic she was in atrial flutter and was started on amiodarone.  She was admitted to the hospital and was discharged 5 days later.  900 mL of fluid was drained with pericardiocentesis.  According to radiation oncology medical record clinic visit from 08/23/2017 her chest CT showed interval progression.  On 08/25/2017 she had 900 mL of serous fluid drained from her left pleural space.  Cytology from both effusions was negative.  Patient has had continued physical and functional slow decline over the past several months.  Remains hospitalized, today is day 10.  Chest tube was removed today.  She remains weak and dyspneic at rest.   Patient and family face advance directive decisions, treatment  options decisions, and anticipatory care needs secondary to her multiple comorbidities, high risk to decompensate, and long-term poor prognosis.    Clinical Assessment and Goals of Care:  This NP Wadie Lessen reviewed medical records, received report from team, assessed the patient and then meet at the patient's bedside along with her son Gerald Stabs and her husband to discuss diagnosis, prognosis, GOC, EOL wishes disposition and options.  A detailed discussion was had today regarding advanced directives.  Concepts specific to code status, artifical feeding and hydration, continued IV antibiotics and rehospitalization was had.  The difference between a aggressive medical intervention path  and a palliative comfort care path for this patient at this time was had.  Values and goals of care important to patient and family were attempted to be elicited.  MOST form introduced  Concept of Hospice and Palliative Care were discussed  Natural trajectory and expectations at EOL were discussed.  Questions and concerns addressed.   Family encouraged to call with questions or concerns.    PMT will continue to support holistically.    SUMMARY OF RECOMMENDATIONS    At this time patient and family are hopeful for improvement.  Continue to treat the treatable. Overall the patient's children are coming in from out of  town over the next few days and as a family they look forward to being together.  All understand the seriousness of the situation and the difficult decisions facing them.  Code Status/Advance Care Planning:  DNR    Symptom Management:   Dyspnea: Roxanol 5 mg p.o./sublingual every 4 hours as needed  Palliative Prophylaxis:   Aspiration, Bowel Regimen, Delirium Protocol, Frequent Pain Assessment and Oral Care  Additional Recommendations (Limitations, Scope, Preferences):  Full Scope Treatment  Psycho-social/Spiritual:   Desire for further Chaplaincy support:yes   Prognosis:    Unable to determine -dependent on desire for life prolonging interventions  Discharge Planning: To Be Determined      Primary Diagnoses: Present on Admission: . Pericardial effusion   I have reviewed the medical record, interviewed the patient and family, and examined the patient. The following aspects are pertinent.  Past Medical History:  Diagnosis Date  . Anemia   . 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 Arthritis   . Bradycardia   . CAD (coronary artery disease)   . Cellulitis of right forearm 05/10/2017  . COPD (chronic obstructive pulmonary disease) ([place])   . Coronary atherosclerosis of native coronary artery 2011  . Dysrhythmia    a fib  . Encounter for antineoplastic chemotherapy 06/23/2016  . Fibrocystic breast   . GERD (gastroesophageal reflux disease)   . History of radiation therapy 07/05/16 - 08/09/16    Left Lung: 45 Gy in 25 fractions  . History of radiation therapy 06/14/2017-06/23/2017   left kidney was treated to 40 Gy in 5 fractions  . HTN (hypertension)   . Hyperlipidemia   . Hypothyroidism   . Neutropenic fever ([place]) 03/15/2017  . Obesity   . Pain in joint   . Pneumonia   . Right renal mass 11/30/2016  . 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 Thrombocytopenia ([place]) 03/15/2017   Social History   Socioeconomic History  . Marital status: Married    Spouse name: None  . Number of children: None  . Years of education: None  . Highest education level: None  Social Needs  . Financial resource strain: None  . Food insecurity - worry: None  . Food insecurity - inability: None  . Transportation needs - medical: None  . Transportation needs - non-medical: None  Occupational History  . Occupation: retired  Tobacco Use  . Smoking status: Former Smoker    Packs/day: 0.50    Years: 60.00    Pack years: 30.00    Last attempt to quit: 04/10/2014    Years since quitting: 3.4  . Smokeless tobacco: Never Used  .  Tobacco comment: june 2015  Substance and Sexual Activity  . Alcohol use: No    Alcohol/week: 0.0 oz  . Drug use: No  . Sexual activity: Not Currently  Other Topics Concern  . None  Social History Narrative  . None   Family History  Problem Relation Age of Onset  . Heart disease Mother    Scheduled Meds: . amLODipine  5 mg Oral Daily  . aspirin EC  81 mg Oral Daily  . atorvastatin  80 mg Oral Daily  . bisacodyl  10 mg Oral Daily  . budesonide  0.5 mg Nebulization BID  . cholecalciferol  1,000 Units Oral Daily  . enoxaparin (LOVENOX) injection  30 mg Subcutaneous Q24H  . furosemide  80 mg Intravenous BID  . guaiFENesin  1,200 mg Oral BID  . levalbuterol  0.63 mg  Nebulization Q6H  . levothyroxine  125 mcg Oral QAC breakfast  . methylPREDNISolone (SOLU-MEDROL) injection  60 mg Intravenous Q12H  . metoprolol succinate  50 mg Oral BID  . multivitamin  1 tablet Oral BID  . pantoprazole  40 mg Oral BID  . protein supplement shake  2 oz Oral Daily  . senna-docusate  1 tablet Oral QHS   Continuous Infusions: . sodium chloride 10 mL/hr at 09/20/17 0028  . ceFEPime (MAXIPIME) IV Stopped (09/20/17 1435)  . lactated ringers Stopped (09/29/2017 1215)  . potassium chloride    . vancomycin Stopped (09/20/17 1436)   PRN Meds:.acetaminophen **OR** acetaminophen (TYLENOL) oral liquid 160 mg/5 mL, ipratropium-albuterol, lidocaine-prilocaine, magnesium hydroxide, morphine injection, naloxone, ondansetron (ZOFRAN) IV, oxyCODONE, potassium chloride, sodium chloride flush, traMADol Medications Prior to Admission:  Prior to Admission medications   Medication Sig Start Date End Date Taking? Authorizing Provider  amLODipine (NORVASC) 5 MG tablet Take 5 mg by mouth daily.   Yes [provider]  aspirin 81 MG tablet Take 81 mg by mouth daily.   Yes [provider]  atorvastatin (LIPITOR) 80 MG tablet Take 1 tablet (80 mg total) by mouth daily. 07/31/17  Yes [name], MD    budesonide (PULMICORT) 0.5 MG/2ML nebulizer solution Take 2 mLs (0.5 mg total) by nebulization 2 (two) times daily. 06/19/17  Yes [name], MD  feeding supplement (BOOST HIGH PROTEIN) LIQD Take 1 Container by mouth daily.    Yes [provider]  ferrous sulfate 325 (65 FE) MG EC tablet Take 325 mg daily with breakfast by mouth.   Yes [provider]  ipratropium-albuterol (DUONEB) 0.5-2.5 (3) MG/3ML SOLN Take 3 mLs by nebulization every 6 (six) hours as needed. Patient taking differently: Take 3 mLs by nebulization 2 (two) times daily.  06/30/17  Yes [name], MD  levothyroxine (SYNTHROID, LEVOTHROID) 125 MCG tablet Take 1 tablet (125 mcg total) daily before breakfast by mouth. 08/18/17  Yes [name], MD  metoprolol succinate (TOPROL-XL) 50 MG 24 hr tablet TAKE ONE TABLET BY MOUTH TWICE DAILY Patient taking differently: TAKE 50 MG BY MOUTH TWICE DAILY 05/01/17  Yes Belva Crome, MD  Multiple Vitamins-Minerals (ICAPS AREDS FORMULA PO) Take 1 tablet 2 (two) times daily by mouth.   Yes [provider]  omeprazole (PRILOSEC) 20 MG capsule Take 30- 60 min before your first and last meals of the day Patient taking differently: Take 20 mg by mouth daily.  06/09/17  Yes Tanda Rockers, MD  predniSONE (DELTASONE) 10 MG tablet Take 1 tablet (10 mg total) daily with breakfast by mouth. 08/22/17  Yes [name], MD  Vitamin D, Cholecalciferol, 1000 UNITS CAPS Take 1,000 Units by mouth daily.  08/25/14  Yes [provider]  acetaminophen (TYLENOL) 325 MG tablet Take 650 mg by mouth every 6 (six) hours as needed for moderate pain or headache.     [provider]  lidocaine-prilocaine (EMLA) cream Apply 1 application topically as needed (for port).    [provider]   Allergies  Allergen Reactions  . Ampicillin Nausea And Vomiting and Other (See Comments)    Has patient had a PCN reaction causing immediate rash, facial/tongue/throat  swelling, SOB or lightheadedness with hypotension: no Has patient had a PCN reaction causing severe rash involving mucus membranes or skin necrosis: no Has patient had a PCN reaction that required hospitalization: no Has patient had a PCN reaction occurring within the last 10 years:  no If all of the above answers are "NO", then may proceed with Cephalosporin use  . Codeine Nausea And Vomiting  . Colchicine Diarrhea and Nausea And Vomiting   Review of Systems  Constitutional: Positive for fatigue.  Respiratory: Positive for shortness of breath.   Neurological: Positive for weakness.    Physical Exam  Constitutional: She appears lethargic. She appears ill. Nasal cannula in place.  -Generalized weakness  Cardiovascular: Normal rate, regular rhythm and normal heart sounds.  Pulmonary/Chest: She has decreased breath sounds in the right lower field, the left middle field and the left lower field.  Neurological: She appears lethargic.  Skin: Skin is warm and dry.    Vital Signs: BP 117/62 (BP Location: Right Arm)   Pulse 66   Temp 97.8 F (36.6 C) (Oral)   Resp (!) 29   Ht 5\' 4"  (1.626 m)   Wt 72.9 kg (160 lb 11.5 oz)   SpO2 97%   BMI 27.59 kg/m  Pain Assessment: No/denies pain POSS *See Group Information*: 1-Acceptable,Awake and alert Pain Score: 0-No pain   SpO2: SpO2: 97 % O2 Device:SpO2: 97 % O2 Flow Rate: .O2 Flow Rate (L/min): 5 L/min  IO: Intake/output summary:   Intake/Output Summary (Last 24 hours) at 09/21/2017 0903 Last data filed at 09/21/2017 0849 Gross per 24 hour  Intake -  Output 1995 ml  Net -1995 ml    LBM: Last BM Date: 09/20/17 Baseline Weight: Weight: 64.9 kg (143 lb) Most recent weight: Weight: 72.9 kg (160 lb 11.5 oz)     Palliative Assessment/Data: 30% at best   Discussed with Dr Roxan Hockey and Dr Elsworth Soho  Time In: 1145 Time Out: 1300 Time Total: 75 minutes Greater than 50%  of this time was spent counseling and coordinating care related  to the above assessment and plan.  Signed by: Wadie Lessen, NP   Please contact Palliative Medicine Team phone at [phone] for questions and concerns.  For individual provider: See Shea Evans

## 2017-09-21 NOTE — Progress Notes (Addendum)
76 year old ex-smoker with squamous cell carcinoma initially diagnosed 05/2016, underwent chemoradiation but unfortunately had disease progression with left adrenal metastases and again underwent radiation for this. She developed pericardial effusion and first underwent pericardiocentesis 10/29, cytology negative. When the fluid recurred she needed pericardial window which is performed 12/3, again cytology negative. Pleural effusion has been evaluated on 11/16 and 12/3 both times cytology negative  Unfortunately her course was complicated by left hydropneumothorax and increasing infiltrates and left lower lobe consolidation. She is being treated with cefepime and vancomycin for HCAP.  She had an episode of respiratory distress 12/12 with presumed aspiration and chest x-ray appeared worse, was put on Lasix She is diuresed 1.6 L but renal function seems to be worsening.  I started her on low-dose morphine for work of breathing after discussing with her husband's his dyspnea seems to be her main complaint.  She did not receive this but her breathing does appear to be somewhat improved today although she still complains of dyspnea. Exam is unchanged with decreased breath sounds on left and coarse crackles on the right, chest tube not draining much.  Recommend Agree with discontinuing chest tube. Palliative care seeing-would be okay with low-dose morphine for work of breathing/dyspnea. 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 Elsworth Soho MD [phone]

## 2017-09-21 NOTE — Progress Notes (Signed)
Pt sats 85-88% on 5L [place], pt requested med to help her breathing (concentrated morphine 5mg  po given RR 30 at this time)-- med given and RT called and requested neb and or assessment for change in O2 delivery.  RT to come see pt.

## 2017-09-21 NOTE — Progress Notes (Addendum)
10 Days Post-Op Procedure(s) (LRB): SUBXYPHOID PERICARDIAL WINDOW (N/A) CHEST TUBE INSERTION (Left) Subjective: Feels about the same  Objective: Vital signs in last 24 hours: Temp:  [97.1 F (36.2 C)-97.8 F (36.6 C)] 97.8 F (36.6 C) (12/13 0740) Pulse Rate:  [58-66] 66 (12/13 0740) Cardiac Rhythm: Normal sinus rhythm (12/13 0700) Resp:  [12-29] 29 (12/13 0740) BP: (99-117)/(55-78) 117/62 (12/13 0740) SpO2:  [97 %-100 %] 97 % (12/13 0740)  Hemodynamic parameters for last 24 hours:    Intake/Output from previous day: 12/12 0701 - 12/13 0700 In: -  Out: [place] [Urine:1650; Chest Tube:20] Intake/Output this shift: No intake/output data recorded.  General appearance: alert, cooperative, fatigued and mild distress Heart: regular rate and rhythm Lungs: coarse BS Abdomen: soft, non-tender Extremities: PAS in place Wound: incis healing well  Lab Results: Recent Labs    09/19/17 0240  WBC 8.2  HGB 8.2*  HCT 26.0*  PLT 176   BMET:  Recent Labs    09/19/17 0240  NA 128*  K 5.4*  CL 94*  CO2 26  GLUCOSE 124*  BUN 55*  CREATININE 1.48*  CALCIUM 8.6*    PT/INR: No results for input(s): LABPROT, INR in the last 72 hours. ABG    Component Value Date/Time   PHART 7.376 09/19/2017 2040   HCO3 25.3 09/19/2017 2040   TCO2 27 03/19/2010 0726   O2SAT 90.0 09/19/2017 2040   CBG (last 3)  No results for input(s): GLUCAP in the last 72 hours.  Meds Scheduled Meds: . amLODipine  5 mg Oral Daily  . aspirin EC  81 mg Oral Daily  . atorvastatin  80 mg Oral Daily  . bisacodyl  10 mg Oral Daily  . budesonide  0.5 mg Nebulization BID  . cholecalciferol  1,000 Units Oral Daily  . enoxaparin (LOVENOX) injection  30 mg Subcutaneous Q24H  . furosemide  80 mg Intravenous BID  . guaiFENesin  1,200 mg Oral BID  . levalbuterol  0.63 mg Nebulization Q6H  . levothyroxine  125 mcg Oral QAC breakfast  . methylPREDNISolone (SOLU-MEDROL) injection  60 mg Intravenous Q12H  .  metoprolol succinate  50 mg Oral BID  . multivitamin  1 tablet Oral BID  . pantoprazole  40 mg Oral BID  . protein supplement shake  2 oz Oral Daily  . senna-docusate  1 tablet Oral QHS   Continuous Infusions: . sodium chloride 10 mL/hr at 09/20/17 0028  . ceFEPime (MAXIPIME) IV Stopped (09/20/17 1435)  . lactated ringers Stopped (09/27/2017 1215)  . potassium chloride    . vancomycin Stopped (09/20/17 1436)   PRN Meds:.acetaminophen **OR** acetaminophen (TYLENOL) oral liquid 160 mg/5 mL, ipratropium-albuterol, lidocaine-prilocaine, magnesium hydroxide, morphine injection, naloxone, ondansetron (ZOFRAN) IV, oxyCODONE, potassium chloride, sodium chloride flush, traMADol  Xrays Dg Chest Port 1 View  Result Date: 09/20/2017 CLINICAL DATA:  Shortness of breath. EXAM: PORTABLE CHEST 1 VIEW COMPARISON:  Radiograph of September 19, 2017. FINDINGS: Stable cardiomegaly. No pneumothorax is noted. Right internal jugular Port-A-Cath is unchanged in position. Stable bilateral lung opacities are noted concerning for pneumonia with stable probable loculated left pleural effusion. Left-sided chest tube is unchanged in position. Bony thorax is unremarkable. IMPRESSION: Stable bilateral lung opacities are noted consistent with pneumonia. Stable probable loculated left pleural effusion. Left-sided chest tube is noted and unchanged. No pneumothorax is noted. Electronically Signed   By: Marijo Conception, M.D.   On: 09/20/2017 08:00   Dg Chest Port 1 View  Result Date: 09/19/2017 CLINICAL DATA:  Acute onset of respiratory distress. EXAM: PORTABLE CHEST 1 VIEW COMPARISON:  Chest radiograph performed earlier today at 6:30 a.m. FINDINGS: Dense left-sided and worsening right-sided airspace opacities are concerning for worsening multifocal pneumonia. A small left pleural effusion is noted. No pneumothorax is seen. The cardiomediastinal silhouette is borderline enlarged. A left-sided chest tube is unchanged in appearance. A  right-sided chest port is noted ending about the mid SVC. No acute osseous abnormalities are seen. Clips are noted within the right upper quadrant, reflecting prior cholecystectomy. IMPRESSION: 1. Dense left-sided and worsening right-sided airspace opacities are concerning for worsening multifocal pneumonia. Small left pleural effusion noted. 2. Borderline cardiomegaly. Electronically Signed   By: Garald Balding M.D.   On: 09/19/2017 23:34   Results for orders placed or performed during the hospital encounter of 09/18/2017  Fungus Culture With Stain     Status: None (Preliminary result)   Collection Time: 09/20/2017 12:02 PM  Result Value Ref Range Status   Fungus Stain Final report  Final    Comment: (NOTE) Performed At: [facility] [place], [place] [id number] Rush Farmer MD [id number]    Fungus (Mycology) Culture PENDING  Incomplete   Fungal Source FLUID  Final    Comment: PERICARDIAL  Acid Fast Smear (AFB)     Status: None   Collection Time: 09/29/2017 12:02 PM  Result Value Ref Range Status   AFB Specimen Processing Concentration  Final   Acid Fast Smear Negative  Final    Comment: (NOTE) Performed At: [facility] [place], [place] [id number] Rush Farmer MD [id number]    Source (AFB) FLUID  Final    Comment: PERICARDIAL  Culture, body fluid-bottle     Status: None   Collection Time: 09/24/2017 12:02 PM  Result Value Ref Range Status   Specimen Description FLUID PERICARDIAL  Final   Special Requests NONE  Final   Culture NO GROWTH 5 DAYS  Final   Report Status 09/16/2017 FINAL  Final  Gram stain     Status: None   Collection Time: 09/10/2017 12:02 PM  Result Value Ref Range Status   Specimen Description FLUID PERICARDIAL  Final   Special Requests NONE  Final   Gram Stain NO WBC SEEN NO ORGANISMS SEEN   Final   Report Status 09/25/2017 FINAL  Final  Fungus Culture Result     Status: None   Collection Time: 09/14/2017 12:02  PM  Result Value Ref Range Status   Result 1 Comment  Final    Comment: (NOTE) KOH/Calcofluor preparation:  no fungus observed. Performed At: [facility] [place], [place] [id number] Rush Farmer MD [id number]   Fungus Culture With Stain     Status: None (Preliminary result)   Collection Time: 10/01/2017 12:05 PM  Result Value Ref Range Status   Fungus Stain Final report  Final    Comment: (NOTE) Performed At: [facility] [place], [place] [id number] Rush Farmer MD [id number]    Fungus (Mycology) Culture PENDING  Incomplete   Fungal Source TISSUE  Final    Comment: PERICARDIUM  Aerobic/Anaerobic Culture (surgical/deep wound)     Status: None   Collection Time: 10/09/2017 12:05 PM  Result Value Ref Range Status   Specimen Description TISSUE  Final   Special Requests PERICARDIUM  Final   Gram Stain   Final    RARE WBC PRESENT, PREDOMINANTLY PMN NO ORGANISMS SEEN    Culture No growth aerobically or anaerobically.  Final  Report Status 09/16/2017 FINAL  Final  Acid Fast Smear (AFB)     Status: None   Collection Time: 09/17/2017 12:05 PM  Result Value Ref Range Status   AFB Specimen Processing Comment  Final    Comment: Tissue Grinding and Digestion/Decontamination   Acid Fast Smear Negative  Final    Comment: (NOTE) Performed At: [facility] [place], [place] [id number] Rush Farmer MD [id number]    Source (AFB) TISSUE  Final    Comment: PERICARDIUM  Virus culture     Status: None   Collection Time: 10/03/2017 12:05 PM  Result Value Ref Range Status   Viral Culture No virus isolated.  Corrected    Comment: (NOTE) Performed At: [facility] [address] [place], [place] [id number] Rush Farmer MD [id number] CORRECTED ON 12/11 AT 1133: PREVIOUSLY REPORTED AS Comment    Source of Sample TISSUE  Final    Comment: PERICARDIUM  Fungus Culture Result     Status: None    Collection Time: 09/26/2017 12:05 PM  Result Value Ref Range Status   Result 1 Comment  Final    Comment: (NOTE) KOH/Calcofluor preparation:  no fungus observed. Performed At: [facility] [place], [place] [id number] Rush Farmer MD [id number]   Culture, blood (Routine X 2) w Reflex to ID Panel     Status: None (Preliminary result)   Collection Time: 09/17/17  4:13 PM  Result Value Ref Range Status   Specimen Description BLOOD LEFT HAND  Final   Special Requests IN PEDIATRIC BOTTLE Blood Culture adequate volume  Final   Culture NO GROWTH 3 DAYS  Final   Report Status PENDING  Incomplete  Culture, blood (Routine X 2) w Reflex to ID Panel     Status: None (Preliminary result)   Collection Time: 09/17/17  4:18 PM  Result Value Ref Range Status   Specimen Description BLOOD RIGHT HAND  Final   Special Requests IN PEDIATRIC BOTTLE Blood Culture adequate volume  Final   Culture NO GROWTH 3 DAYS  Final   Report Status PENDING  Incomplete    Assessment/Plan: S/P Procedure(s) (LRB): SUBXYPHOID PERICARDIAL WINDOW (N/A) CHEST TUBE INSERTION (Left)  1 stable doing fair, afeb with stable VS, sats ok on 5L 2 conts current management- no new labs or CXR 3 no drainage recorded from tube yesterday, only 20 cc today, no air leak- poss d/c soon  LOS: 10 days    John Giovanni 09/21/2017 Patient seen and examined, agree with above Dc chest tube + cough but unable to clear secretions- add flutter valve  Remo Lipps C. Roxan Hockey, MD [facility] [phone]

## 2017-09-22 ENCOUNTER — Inpatient Hospital Stay (HOSPITAL_COMMUNITY): Payer: PPO

## 2017-09-22 ENCOUNTER — Ambulatory Visit: Payer: PPO | Admitting: Interventional Cardiology

## 2017-09-22 LAB — CBC
HCT: 23.1 % — ABNORMAL LOW (ref 36.0–46.0)
HEMOGLOBIN: 7.3 g/dL — AB (ref 12.0–15.0)
MCH: 30.3 pg (ref 26.0–34.0)
MCHC: 31.6 g/dL (ref 30.0–36.0)
MCV: 95.9 fL (ref 78.0–100.0)
Platelets: 164 10*3/uL (ref 150–400)
RBC: 2.41 MIL/uL — ABNORMAL LOW (ref 3.87–5.11)
RDW: 17.1 % — ABNORMAL HIGH (ref 11.5–15.5)
WBC: 18.4 10*3/uL — ABNORMAL HIGH (ref 4.0–10.5)

## 2017-09-22 LAB — CULTURE, BLOOD (ROUTINE X 2)
CULTURE: NO GROWTH
Culture: NO GROWTH
SPECIAL REQUESTS: ADEQUATE
SPECIAL REQUESTS: ADEQUATE

## 2017-09-22 LAB — PREPARE RBC (CROSSMATCH)

## 2017-09-22 LAB — BASIC METABOLIC PANEL
Anion gap: 11 (ref 5–15)
BUN: 77 mg/dL — AB (ref 6–20)
CHLORIDE: 92 mmol/L — AB (ref 101–111)
CO2: 26 mmol/L (ref 22–32)
CREATININE: 2.24 mg/dL — AB (ref 0.44–1.00)
Calcium: 7.7 mg/dL — ABNORMAL LOW (ref 8.9–10.3)
GFR calc Af Amer: 23 mL/min — ABNORMAL LOW (ref >60)
GFR calc non Af Amer: 20 mL/min — ABNORMAL LOW (ref >60)
GLUCOSE: 272 mg/dL — AB (ref 65–99)
Potassium: 4.7 mmol/L (ref 3.5–5.1)
SODIUM: 129 mmol/L — AB (ref 135–145)

## 2017-09-22 MED ORDER — LORAZEPAM 2 MG/ML IJ SOLN
0.5000 mg | INTRAMUSCULAR | Status: DC | PRN
  Administered 2017-09-22 – 2017-09-23 (×4): 0.5 mg via INTRAVENOUS
  Filled 2017-09-22 (×4): qty 1

## 2017-09-22 MED ORDER — MORPHINE SULFATE (PF) 2 MG/ML IV SOLN
1.0000 mg | INTRAVENOUS | Status: DC | PRN
  Administered 2017-09-22: 2 mg via INTRAVENOUS
  Filled 2017-09-22: qty 1

## 2017-09-22 MED ORDER — GLYCOPYRROLATE 0.2 MG/ML IJ SOLN
0.2000 mg | INTRAMUSCULAR | Status: DC | PRN
  Administered 2017-09-22 – 2017-09-23 (×2): 0.2 mg via INTRAVENOUS
  Filled 2017-09-22 (×2): qty 1

## 2017-09-22 MED ORDER — FUROSEMIDE 10 MG/ML IJ SOLN
40.0000 mg | Freq: Once | INTRAMUSCULAR | Status: AC
  Administered 2017-09-22: 40 mg via INTRAVENOUS
  Filled 2017-09-22: qty 4

## 2017-09-22 MED ORDER — MORPHINE SULFATE (PF) 2 MG/ML IV SOLN
2.0000 mg | INTRAVENOUS | Status: DC | PRN
  Administered 2017-09-22 (×2): 2 mg via INTRAVENOUS
  Administered 2017-09-23: 4 mg via INTRAVENOUS
  Filled 2017-09-22 (×2): qty 1
  Filled 2017-09-22: qty 2
  Filled 2017-09-22: qty 1

## 2017-09-22 MED ORDER — SODIUM CHLORIDE 0.9 % IV SOLN
Freq: Once | INTRAVENOUS | Status: AC
  Administered 2017-09-22: 15:00:00 via INTRAVENOUS

## 2017-09-22 NOTE — Progress Notes (Signed)
Patients husband called to get an update on status post RBC transfusion. Informed husband that from beginning of shift that patient has been unresponsive to any stimulation, and her breath sounds were wet. From my shift with her the night before to this evening this was a complete change. I expressed that I was concerned she may decline through the night.   It was reported to me in handoff that the husband wanted to be informed if there were any concern and/or change her status.   When husband arrived he claimed that she appeared that same as when he was visiting during day shift and stated " I want to know which doctor said she was declining this evening' and " I want to speak with a doctor." I told the husband that based on my nursing judgement her condition appears to have declined from my last shift which is why I felt that he may want to be at the bedside. Paged CTS at husbands request and spoke with family member at bedside and by phone to clarify recent events. Family agreed they felt comfortable to leave for the evening and will follow up in the AM.

## 2017-09-22 NOTE — Progress Notes (Signed)
Daily Progress Note   Patient Name: Renee Vance       Date: 09/22/2017 DOB: [birth year]  Age: 76 y.o. MRN#: [id number] Attending Physician: Melrose Nakayama, MD Primary Care Physician: [name], MD Admit Date: 09/26/2017  Reason for Consultation/Follow-up: Establishing goals of care, Non pain symptom management, Pain control and Psychosocial/spiritual support  Subjective: Patient seen, chart reviewed.  Staffed with bedside RN.  Met with patient's husband..  Patient continues to struggle with dyspnea; currently on a nonrebreather mask with respiratory rate at 30 times a minute.  She is unable to take anything by mouth today.  She is quite anxious, talking about wanting to die.  Husband shares that their daughter is coming up from [place] today, and their son is coming in from [place] today.  Patient's other son, was up from [place] yesterday to see his mother.  Husband is struggling with how quickly his wife is declined.  He is very tearful when talking about end-of-life  Length of Stay: 11  Current Medications: Scheduled Meds:  . amLODipine  5 mg Oral Daily  . aspirin EC  81 mg Oral Daily  . atorvastatin  80 mg Oral Daily  . bisacodyl  10 mg Oral Daily  . budesonide  0.5 mg Nebulization BID  . enoxaparin (LOVENOX) injection  30 mg Subcutaneous Q24H  . guaiFENesin  1,200 mg Oral BID  . levalbuterol  0.63 mg Nebulization Q6H  . levothyroxine  125 mcg Oral QAC breakfast  . methylPREDNISolone (SOLU-MEDROL) injection  60 mg Intravenous Q12H  . metoprolol succinate  50 mg Oral BID  . pantoprazole  40 mg Oral BID  . protein supplement shake  2 oz Oral Daily  . senna-docusate  1 tablet Oral QHS    Continuous Infusions: . sodium chloride 10 mL/hr at 09/20/17 0028   . ceFEPime (MAXIPIME) IV Stopped (09/21/17 1541)  . lactated ringers Stopped (10/01/2017 1215)  . potassium chloride      PRN Meds: acetaminophen **OR** acetaminophen (TYLENOL) oral liquid 160 mg/5 mL, ipratropium-albuterol, lidocaine-prilocaine, LORazepam, magnesium hydroxide, morphine injection, naloxone, ondansetron (ZOFRAN) IV, potassium chloride, sodium chloride flush  Physical Exam  Constitutional: She appears well-developed and well-nourished.  Older female, acutely ill, stating she just wants to die  Neck: Normal range of motion.  Pulmonary/Chest:  Increased work  of breathing; patient on a nonrebreather mask  Neurological: She is alert.  Oriented to herself, recognizes her family, understand she is in the hospital  Skin: Skin is warm and dry. There is pallor.  Psychiatric:  Anxious, agitated Talking about wanting to die  Nursing note and vitals reviewed.           Vital Signs: BP (!) 108/56 (BP Location: Right Arm)   Pulse 74   Temp (!) 97.2 F (36.2 C) (Axillary)   Resp (!) 31   Ht '5\' 4"'$  (1.626 m)   Wt 72.9 kg (160 lb 11.5 oz)   SpO2 91%   BMI 27.59 kg/m  SpO2: SpO2: 91 % O2 Device: O2 Device: Venturi Mask O2 Flow Rate: O2 Flow Rate (L/min): 15 L/min  Intake/output summary:   Intake/Output Summary (Last 24 hours) at 09/22/2017 1155 Last data filed at 09/21/2017 1900 Gross per 24 hour  Intake 331.83 ml  Output 750 ml  Net -418.17 ml   LBM: Last BM Date: 09/20/17 Baseline Weight: Weight: 64.9 kg (143 lb) Most recent weight: Weight: 72.9 kg (160 lb 11.5 oz)       Palliative Assessment/Data:    Flowsheet Rows     Most Recent Value  Intake Tab  Referral Department  Hospitalist  Unit at Time of Referral  Med/Surg Unit  Palliative Care Primary Diagnosis  Cancer  Date Notified  09/20/17  Palliative Care Type  New Palliative care  Reason for referral  Non-pain Symptom  Date of Admission  09/14/2017  Date first seen by Palliative Care  09/21/17  # of  days Palliative referral response time  1 Day(s)  # of days IP prior to Palliative referral  9  Clinical Assessment  Psychosocial & Spiritual Assessment  Palliative Care Outcomes      Patient Active Problem List   Diagnosis Date Noted  . AKI (acute kidney injury) ([place])   . Dyspnea   . Palliative care by specialist   . DNR (do not resuscitate)   . Pleural effusion   . Chronic obstructive pulmonary disease ([place])   . Pressure injury of skin 09/12/2017  . Aortic atherosclerosis ([place]) 08/10/2017  . Acute on chronic respiratory failure with hypoxia ([place])   . Atrial flutter ([place]) 08/07/2017  . Idiopathic pericardial effusion 08/07/2017  . Radiation pneumonitis ([place]) 06/30/2017  . Port catheter in place 05/22/2017  . Pericardial effusion 05/22/2017  . Cellulitis of right forearm 05/10/2017  . Encounter for antineoplastic chemotherapy 04/26/2017  . Febrile neutropenia ([place]) 03/15/2017  . Acute renal failure superimposed on stage 4 chronic kidney disease ([place]) 03/15/2017  . Thrombocytopenia ([place]) 03/15/2017  . HCAP (healthcare-associated pneumonia)   . Chronic respiratory failure with hypoxia ([place]) 10/29/2016  . Anemia due to antineoplastic chemotherapy 09/26/2016  . Stage IV squamous cell carcinoma of left lung ([place]) 06/23/2016  . PAF (paroxysmal atrial fibrillation) ([place]) 03/26/2015  . HTN (hypertension)   . Coronary artery disease involving native coronary artery of native heart with angina pectoris ([place])   . Hypothyroidism   . COPD GOLD II      Palliative Care Assessment & Plan   Patient Profile:  76 y.o. female   admitted on 10/06/2017  past medical history is significant for coronary artery disease with previous stenting, paroxysmal atrial fibrillation, tobacco abuse, COPD, interstitial lung disease, hyperlipidemia, and hypertension. She was diagnosed last year with metastatic NSC  squamous cell lung carcinoma.  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 She was treated with chemo and  radiation. She had to quit chemotherapy due to side effects. She recently was treated with radiation for her left adrenal mass.  She was having problems with worsening shortness of breath. And also was having problems with leg swelling. A CT back in October showed a pericardial and left pleural effusion. She was having a pericardiocentesis on 08/07/2017 when she became tachypneic and tachycardic she was in atrial flutter and was started on amiodarone. She was admitted to the hospital and was discharged 5 days later. 900 mL of fluid was drained withpericardiocentesis.  According to radiation oncology medical record clinic visit from 08/23/2017 her chest CT showed interval progression.  On 08/25/2017 she had 900 mL of serous fluid drained from her left pleural space.  Cytology from both effusions was negative.  Patient has had continued physical and functional slow decline over the past several months.  Remains hospitalized, today is day 10.  Chest tube was removed today.  She remains weak and dyspneic at rest.    Assessment: Pt appears to be having nearing death awareness: dreams about dying, readiness and time to die. Husband is waiting for children to come in today from out-of-town.  Morphine has been effective for dyspnea and has alleviated some of her anxiety. Met with son, DIL, dtr and SIL at the bedside. Husband shares that pt is going to have a blood transfusion which he hopes will help her to feel better. He states his hope is still to get her well enough to go back home  Recommendations/Plan:  Patient is not full comfort care yet.  Continue to work with patient and family regarding goals of care, next steps.  Continue with antibiotics, IV fluids, blood transfusion  Patient is quite short of breath.  We will add IV morphine 1-2 every 2 hours as needed for pain or shortness of breath.  Continue with nonrebreather.   Anxiety associated with dyspnea: We will add Ativan 0.5  every 4 hours as needed  Would recommend discontinuation of Narcan  Plan is to meet with children, patient's husband and talk about next steps such as residential hospice, home with hospice as we see how pt does over the weekend   Code Status:    Code Status Orders  (From admission, onward)        Start     Ordered   09/20/17 1348  Do not attempt resuscitation (DNR)  Continuous    Question Answer Comment  In the event of cardiac or respiratory ARREST Do not call a "code blue"   In the event of cardiac or respiratory ARREST Do not perform Intubation, CPR, defibrillation or ACLS   In the event of cardiac or respiratory ARREST Use medication by any route, position, wound care, and other measures to relive pain and suffering. May use oxygen, suction and manual treatment of airway obstruction as needed for comfort.      09/20/17 1349    Code Status History    Date Active Date Inactive Code Status Order ID Comments User Context   09/16/2017 18:17 09/20/2017 13:49 Full Code [id number]  Odis Luster Inpatient   08/07/2017 16:05 08/10/2017 16:26 Full Code [id number]  Belva Crome, MD Inpatient   03/15/2017 17:11 03/21/2017 18:36 Full Code [id number]  Robbie Lis, MD Inpatient   03/03/2017 20:58 03/04/2017 21:25 Full Code [id number]  Norval Morton, MD ED   10/29/2016 03:49 11/04/2016 18:29 Full Code [id number]  Karmen Bongo, MD Inpatient       Prognosis: Unable  to determine.  If things were to continue to go the way they have been now, I would not be surprised if patient had a prognosis of less than 2 weeks in the setting of lung cancer, pericardial effusion ;  Discharge Planning:  To Be Determined   Thank you for allowing the Palliative Medicine Team to assist in the care of this patient.   Time In: 1000 Time Out: 1100 Total Time 60 min Prolonged Time Billed  no       Greater than 50%  of this time was spent counseling and coordinating care related to the above assessment  and plan.  Dory Horn, NP  Please contact Palliative Medicine Team phone at [phone] for questions and concerns.

## 2017-09-22 NOTE — Progress Notes (Signed)
Paged on-call CTS per husbands request.

## 2017-09-22 NOTE — Progress Notes (Addendum)
[address]       [place] [address]             [phone]      11 Days Post-Op Procedure(s) (LRB): SUBXYPHOID PERICARDIAL WINDOW (N/A) CHEST TUBE INSERTION (Left) Subjective: On non-rebreather, conts to have some clinical steady clinical deterioration  Objective: Vital signs in last 24 hours: Temp:  [96.9 F (36.1 C)-97.5 F (36.4 C)] 97.2 F (36.2 C) (12/14 0353) Pulse Rate:  [66-84] 74 (12/14 0800) Cardiac Rhythm: Normal sinus rhythm (12/14 0800) Resp:  [20-33] 31 (12/14 0800) BP: (108-133)/(52-69) 108/56 (12/14 0800) SpO2:  [90 %-99 %] 91 % (12/14 0800) FiO2 (%):  [55 %] 55 % (12/14 0800)  Hemodynamic parameters for last 24 hours:    Intake/Output from previous day: 12/13 0701 - 12/14 0700 In: 390 [P.O.:180; I.V.:160; IV Piggyback:50] Out: 1694 [Urine:1075] Intake/Output this shift: No intake/output data recorded.  General appearance: delirious and moderate distress Neurologic: intermit levels of confusion Heart: regular rate and rhythm Lungs: coarse ronchi/crackles Abdomen: soft, nontender Extremities: + LE edema Wound: incis healing well  Lab Results: Recent Labs    09/21/17 0845 09/22/17 0946  WBC 12.8* 18.4*  HGB 7.8* 7.3*  HCT 24.8* 23.1*  PLT 143* 164   BMET:  Recent Labs    09/21/17 0845 09/22/17 0946  NA 128* 129*  K 5.1 4.7  CL 92* 92*  CO2 28 26  GLUCOSE 121* 272*  BUN 77* 77*  CREATININE 2.07* 2.24*  CALCIUM 8.2* 7.7*    PT/INR: No results for input(s): LABPROT, INR in the last 72 hours. ABG    Component Value Date/Time   PHART 7.376 09/19/2017 2040   HCO3 25.3 09/19/2017 2040   TCO2 27 03/19/2010 0726   O2SAT 90.0 09/19/2017 2040   CBG (last 3)  No results for input(s): GLUCAP in the last 72 hours.  Meds Scheduled Meds: . amLODipine  5 mg Oral Daily  . aspirin EC  81 mg Oral Daily  . atorvastatin  80 mg Oral Daily  . bisacodyl  10 mg Oral Daily  . budesonide  0.5 mg Nebulization BID  .  enoxaparin (LOVENOX) injection  30 mg Subcutaneous Q24H  . furosemide  80 mg Intravenous Daily  . guaiFENesin  1,200 mg Oral BID  . levalbuterol  0.63 mg Nebulization Q6H  . levothyroxine  125 mcg Oral QAC breakfast  . methylPREDNISolone (SOLU-MEDROL) injection  60 mg Intravenous Q12H  . metoprolol succinate  50 mg Oral BID  . pantoprazole  40 mg Oral BID  . protein supplement shake  2 oz Oral Daily  . senna-docusate  1 tablet Oral QHS   Continuous Infusions: . sodium chloride 10 mL/hr at 09/20/17 0028  . ceFEPime (MAXIPIME) IV Stopped (09/21/17 1541)  . lactated ringers Stopped (09/18/2017 1215)  . potassium chloride     PRN Meds:.acetaminophen **OR** acetaminophen (TYLENOL) oral liquid 160 mg/5 mL, ipratropium-albuterol, lidocaine-prilocaine, magnesium hydroxide, morphine injection, naloxone, ondansetron (ZOFRAN) IV, potassium chloride, sodium chloride flush  Xrays Dg Chest Port 1 View  Result Date: 09/22/2017 CLINICAL DATA:  Short of breath EXAM: PORTABLE CHEST 1 VIEW COMPARISON:  09/21/2017 FINDINGS: Diffuse airspace disease throughout both lungs is worse on the right and stable on the left. On the left, there is dense central opacity and hazy opacity in the upper and lower is lung zones. Right jugular Port-A-Cath stable. Cardiomegaly is stable. Small left pleural effusion is stable. No pneumothorax. IMPRESSION: Bilateral airspace disease is  stable on the left and worse throughout the right lung. Electronically Signed   By: Marybelle Killings M.D.   On: 09/22/2017 08:45   Dg Chest Port 1 View  Result Date: 09/21/2017 CLINICAL DATA:  Shortness of breath status post chest tube removal. EXAM: PORTABLE CHEST 1 VIEW COMPARISON:  Radiograph of September 21, 2017. FINDINGS: Stable cardiomegaly. Atherosclerosis of thoracic aorta is noted. Right internal jugular Port-A-Cath is unchanged in position. Left-sided chest tube has been removed without pneumothorax. Stable left lung opacity is noted  concerning for pneumonia with possible associated pleural effusion. Stable mild right lung opacities are noted. Bony thorax is unremarkable. IMPRESSION: Stable bilateral lung opacities are noted. No pneumothorax is seen status post left-sided chest tube removal. Aortic atherosclerosis. Electronically Signed   By: Marijo Conception, M.D.   On: 09/21/2017 12:43   Dg Chest Port 1 View  Result Date: 09/21/2017 CLINICAL DATA:  Chest tube placement EXAM: PORTABLE CHEST 1 VIEW COMPARISON:  09/20/2017. FINDINGS: LEFT chest tube remains in apparent good position, from a lateral basilar approach. Dense opacity throughout much of the LEFT hemithorax remains stable, likely combination of atelectasis, effusion, and consolidation. RIGHT lung opacity not significantly changed. Port-A-Cath good position. IMPRESSION: Stable exam.  LEFT chest tube in good position.  No pneumothorax. Electronically Signed   By: Staci Righter M.D.   On: 09/21/2017 08:30    Assessment/Plan: S/P Procedure(s) (LRB): SUBXYPHOID PERICARDIAL WINDOW (N/A) CHEST TUBE INSERTION (Left)   1 clinically worsening pulmonary status 2 increased leukocytosis- cont current abx for pneumonia 3 significant anemia, could poss benefit from transfusion for oxygen carrying capacity 4 will cont morphine to keep comfortable 5 cont lasix, more edematous, renal fxn is slightly worse   LOS: 11 days    John Giovanni 09/22/2017 Patient seen and examined, agree with above WBC up significantly and CXR looks worse on right despite negative I/o to the point renal function deteriorating.  Agree with Dr. Elsworth Soho re: dc vanco and lasix I do think she might benefit from a blood transfusion Mr. Laitinen is at the bedside. Their overall concern is comfort as it should be  Remo Lipps C. Roxan Hockey, MD [facility] [phone]

## 2017-09-22 NOTE — Progress Notes (Signed)
OT Cancellation Note  Patient Details Name: Renee Vance MRN: [id number] DOB: 03/23/1941   Cancelled Treatment:    Reason Eval/Treat Not Completed: Goal of care is now comfort per MD note. Signing off.  [name] 09/22/2017, 1:12 PM  09/22/2017 [name], OTR/L Pager: [phone]

## 2017-09-22 NOTE — Care Management Note (Signed)
Case Management Note  Patient Details  Name: Renee Vance MRN: [id number] Date of Birth: [birth year]  Subjective/Objective:       Pt is s/p pericardial window - CT insertion             Action/Plan:   PTA independent from home with husband.  CM offered choice for [facility] and DME as recommended - pt chose [facility] - agency contacted and referral accepted   Expected Discharge Date:                  Expected Discharge Plan:  [place]  In-House Referral:     Discharge planning Services  CM Consult  Post Acute Care Choice:  Durable Medical Equipment Choice offered to:  Patient  DME Arranged:  3-N-1, [place] rolling DME Agency:  [place]:  PT, OT [facility] Agency:  [place]  Status of Service:  In process, will continue to follow  If discussed at Long Length of Stay Meetings, dates discussed:    Additional Comments: 09/22/2017  Palliative following closely as pt may be appropriate for residential hospice or possibly full comfort.  CSW informed of tentative referral  09/21/17 Discussed in LOS 12/11 - pt remains appropriate for continued stay.  Pt is now recommended for SNF - CSW consulted.   Maryclare Labrador, RN 09/22/2017, 2:23 PM

## 2017-09-22 NOTE — Progress Notes (Signed)
Pt. Stated that she did not want to take any of her medicines today and that she wanted to die. Pt. Having difficulty breathing and on a non-rebreather.

## 2017-09-22 NOTE — Progress Notes (Signed)
76 year old ex-smoker admitted 12/3 for pericardial window for recurrent pericardial effusion. Unfortunately her coursewascomplicated by left hydropneumothorax and increasing infiltrates and left lower lobe consolidation. She is being treated with cefepime and vancomycin for HCAP  H/o  squamous cell carcinoma initially diagnosed 05/2016, underwent chemoradiation but unfortunately had disease progression with left adrenal metastases and again underwent radiation for this. She developed pericardial effusion and first underwent pericardiocentesis 10/29, cytology negative. When the fluid recurred she needed pericardial window which is performed 12/3, again cytology negative. Pleural effusion has been evaluated on 11/16 and 12/3 both times cytology negative   Chest tube was discontinued 12/13 and low-dose morphine started  Unfortunately she appears worse, work of breathing still remains high, she is somewhat confused, husband is bedside and she keeps saying that she is going to die and would like to die soon.  This is causing her husband more distress She is on nonrebreather with coarse breath sounds bilateral and decreased on the left  Recommend- I think our goal is for comfort here now, will increase morphine 2-4 mg every 2 hours as needed I would be okay if she needed a morphine drip for increased work of breathing Waiting on family members to arrive .  If she survives the weekend, would be okay with discharge next week with hospice care, meanwhile comfort most important, husband in agreement  AKI -discontinue Lasix and vancomycin HCAP -continue cefepime  P CCM will check back next week, palliative care on board  Rakesh V. Elsworth Soho MD

## 2017-09-23 ENCOUNTER — Inpatient Hospital Stay (HOSPITAL_COMMUNITY): Payer: PPO

## 2017-09-23 DIAGNOSIS — J431 Panlobular emphysema: Secondary | ICD-10-CM

## 2017-09-23 LAB — CBC
HCT: 29.1 % — ABNORMAL LOW (ref 36.0–46.0)
Hemoglobin: 9.2 g/dL — ABNORMAL LOW (ref 12.0–15.0)
MCH: 30.6 pg (ref 26.0–34.0)
MCHC: 31.6 g/dL (ref 30.0–36.0)
MCV: 96.7 fL (ref 78.0–100.0)
Platelets: 135 10*3/uL — ABNORMAL LOW (ref 150–400)
RBC: 3.01 MIL/uL — AB (ref 3.87–5.11)
RDW: 17.3 % — AB (ref 11.5–15.5)
WBC: 17.7 10*3/uL — ABNORMAL HIGH (ref 4.0–10.5)

## 2017-09-23 LAB — BASIC METABOLIC PANEL
Anion gap: 11 (ref 5–15)
BUN: 99 mg/dL — AB (ref 6–20)
CO2: 28 mmol/L (ref 22–32)
Calcium: 8.3 mg/dL — ABNORMAL LOW (ref 8.9–10.3)
Chloride: 90 mmol/L — ABNORMAL LOW (ref 101–111)
Creatinine, Ser: 2.71 mg/dL — ABNORMAL HIGH (ref 0.44–1.00)
GFR calc Af Amer: 18 mL/min — ABNORMAL LOW (ref >60)
GFR, EST NON AFRICAN AMERICAN: 16 mL/min — AB (ref >60)
GLUCOSE: 153 mg/dL — AB (ref 65–99)
POTASSIUM: 5.5 mmol/L — AB (ref 3.5–5.1)
Sodium: 129 mmol/L — ABNORMAL LOW (ref 135–145)

## 2017-09-23 LAB — BPAM RBC
BLOOD PRODUCT EXPIRATION DATE: 201812252359
ISSUE DATE / TIME: 201812141615
Unit Type and Rh: 1700

## 2017-09-23 LAB — TYPE AND SCREEN
ABO/RH(D): B NEG
Antibody Screen: NEGATIVE
UNIT DIVISION: 0

## 2017-09-23 LAB — VANCOMYCIN, RANDOM: Vancomycin Rm: 23

## 2017-09-23 MED ORDER — VANCOMYCIN HCL IN DEXTROSE 750-5 MG/150ML-% IV SOLN
750.0000 mg | Freq: Once | INTRAVENOUS | Status: DC
  Filled 2017-09-23: qty 150

## 2017-09-23 MED ORDER — VANCOMYCIN HCL IN DEXTROSE 1-5 GM/200ML-% IV SOLN
1000.0000 mg | Freq: Once | INTRAVENOUS | Status: DC
  Filled 2017-09-23: qty 200

## 2017-09-23 MED ORDER — LEVALBUTEROL HCL 0.63 MG/3ML IN NEBU
0.6300 mg | INHALATION_SOLUTION | Freq: Four times a day (QID) | RESPIRATORY_TRACT | Status: DC
  Administered 2017-09-23: 0.63 mg via RESPIRATORY_TRACT
  Filled 2017-09-23: qty 3

## 2017-09-25 ENCOUNTER — Other Ambulatory Visit: Payer: PPO

## 2017-09-25 ENCOUNTER — Ambulatory Visit: Payer: PPO | Admitting: Nurse Practitioner

## 2017-09-25 ENCOUNTER — Telehealth: Payer: Self-pay | Admitting: Pediatrics

## 2017-09-25 ENCOUNTER — Telehealth: Payer: Self-pay | Admitting: Pharmacist

## 2017-09-25 ENCOUNTER — Ambulatory Visit (HOSPITAL_COMMUNITY): Admission: RE | Admit: 2017-09-25 | Payer: PPO | Source: Ambulatory Visit

## 2017-09-25 NOTE — Patient Outreach (Signed)
[place] [facility] Med) Care Management  09/25/2017  Renee Vance [birth year] [id number]   Pharmacy case will be closed as the patient has expired.   [name], PharmD, [place] Clinical Pharmacist [phone]

## 2017-09-26 ENCOUNTER — Ambulatory Visit: Payer: PPO | Admitting: Thoracic Surgery (Cardiothoracic Vascular Surgery)

## 2017-09-26 ENCOUNTER — Other Ambulatory Visit: Payer: Self-pay | Admitting: Licensed Clinical Social Worker

## 2017-09-26 ENCOUNTER — Ambulatory Visit: Payer: PPO | Admitting: Internal Medicine

## 2017-09-26 NOTE — Patient Outreach (Signed)
Assessment:  CSW had recently received referral on Renee Vance. CSW received communication from Renee Vance, [place] that client had expired recently. Client had been receiving care at [facility] recently. Renee Brood RN, [facility] communicated with CSW that client had expired recently.  Plan:  CSW is discharging Renee Vance. Zia from [place] on 25-Oct-2017 due to fact that client recently expired.  CSW to inform Renee Vance, Case Management Assistant, that [facility] discharged client from [facility] services on 2017-10-25 due to death of client.    Renee Vance.Renee Vance MSW, LCSW Licensed Clinical Social Worker [facility] Care Management [phone]

## 2017-09-29 ENCOUNTER — Other Ambulatory Visit: Payer: Self-pay | Admitting: Nurse Practitioner

## 2017-10-10 LAB — FUNGUS CULTURE RESULT

## 2017-10-10 LAB — FUNGUS CULTURE WITH STAIN

## 2017-10-10 LAB — FUNGAL ORGANISM REFLEX

## 2017-10-10 NOTE — Significant Event (Signed)
Rapid Response Event Note  Overview: Time Called: 0729 Arrival Time: 0733 Event Type: Neurologic, Respiratory, Cardiac  Initial Focused Assessment: Patient unresponsive,  Pupils 6/non reactive,   5/non reactive Agonally breathing  RR 30 Lung sounds coarse BP 98/63   AF 110 Feet mottled Patient appears to be actively dying Patient with DNR status  Interventions: Spoke with Dr Prescott Gum who is in the OR, communicated my assessment Called Palliative Care:  Judson Roch NP at bedside Husband called by Judson Roch regarding patient status, he is requesting an MD confirm that she is dying. Judson Roch also spoke with Dr Halford Chessman regarding patient status.  Patient with period of bradycardia  HR 40s   Then AF  89-110 RR 36  Agonal breathing BP 91/56  Dr Lake Bells at bedside to assess patient.  4 mg Morphine for comfort  Awaiting family  Plan of Care (if not transferred):  Event Summary: Name of Physician Notified: Dr Lawson Fiscal,  prior to my arrival at 2237  Name of Consulting Physician Notified: Dr. Oletta Darter  at 2321  Outcome: Code status clarified     Raliegh Ip

## 2017-10-10 NOTE — Progress Notes (Signed)
Daily Progress Note   Patient Name: Renee Vance       Date: October 01, 2017 DOB: 12-01-1940  Age: 77 y.o. MRN#: [id number] Attending Physician: Melrose Nakayama, MD Primary Care Physician: [name], MD Admit Date: 09/29/2017  Reason for Consultation/Follow-up: Establishing goals of care, Psychosocial/spiritual support and Terminal Care  Subjective: Called to see patient first thing this morning by bedside RN.  Rapid response was at patient's bedside.  Per nursing, patient had exhibited further signs of decline and family was notified by RN.  When I arrived at the bedside patient appeared markedly worse from when I met her on 09/22/2017.  She is now unresponsive even to noxious stimuli.  Respirations are shallow and uneven and she is mottled. I called patient's husband and told him of the changes that I had seen and shared candidly that I fear that patient is dying.  I also tried to reach 1 of patient's children via phone and LM Also notified critical care medicine to see if they can come take a look at patient and offer any suggestions or input . Dr. Lake Bells was nice enough to come to patient's bedside and concurred that patient was at end of life.  Dr. Lake Bells also spoke to patient's son Gerald Stabs on the phone and updated him medically as to what he was seeing.  He ordered morphine 4 mg IV for patient comfort as her respiratory rate was in the 30s  Length of Stay: 12  Current Medications: Scheduled Meds:  . amLODipine  5 mg Oral Daily  . aspirin EC  81 mg Oral Daily  . atorvastatin  80 mg Oral Daily  . bisacodyl  10 mg Oral Daily  . budesonide  0.5 mg Nebulization BID  . enoxaparin (LOVENOX) injection  30 mg Subcutaneous Q24H  . guaiFENesin  1,200 mg Oral BID  . levalbuterol   0.63 mg Nebulization QID  . levothyroxine  125 mcg Oral QAC breakfast  . methylPREDNISolone (SOLU-MEDROL) injection  60 mg Intravenous Q12H  . metoprolol succinate  50 mg Oral BID  . pantoprazole  40 mg Oral BID  . protein supplement shake  2 oz Oral Daily  . senna-docusate  1 tablet Oral QHS    Continuous Infusions: . sodium chloride 10 mL/hr at 09/22/17 1517  . ceFEPime (MAXIPIME) IV Stopped (  09/22/17 2143)  . lactated ringers Stopped (10/09/2017 1215)  . potassium chloride      PRN Meds: acetaminophen **OR** acetaminophen (TYLENOL) oral liquid 160 mg/5 mL, glycopyrrolate, ipratropium-albuterol, lidocaine-prilocaine, LORazepam, magnesium hydroxide, morphine injection, naloxone, ondansetron (ZOFRAN) IV, potassium chloride, sodium chloride flush  Physical Exam  Constitutional:  Older female, shallow respirations; appears to be actively dying  HENT:  Head: Atraumatic.  Cardiovascular:  Irregular; heart rate ranging from the 40s-110  Pulmonary/Chest:  Very shallow respirations Patient remaining on 100% nonrebreather with O2 sats at this point not registering  Neurological:  Unresponsive to even noxious stimuli  Skin:  Cool, pale; knees and feet mottled  Psychiatric:  Unresponsive  Nursing note and vitals reviewed.           Vital Signs: BP 91/75 (BP Location: Left Arm)   Pulse (!) 36   Temp 98.3 F (36.8 C) (Oral)   Resp (!) 31   Ht _0  (1.626 m)   Wt 69.5 kg (153 lb 3.5 oz)   SpO2 90%   BMI 26.30 kg/m  SpO2: SpO2: 90 % O2 Device: O2 Device: Not Delivered O2 Flow Rate: O2 Flow Rate (L/min): 15 L/min  Intake/output summary:   Intake/Output Summary (Last 24 hours) at 2017-10-21 1033 Last data filed at 10-21-17 0308 Gross per 24 hour  Intake 302.83 ml  Output 175 ml  Net 127.83 ml   LBM: Last BM Date: 09/20/17 Baseline Weight: Weight: 64.9 kg (143 lb) Most recent weight: Weight: 69.5 kg (153 lb 3.5 oz)       Palliative Assessment/Data:    Flowsheet  Rows     Most Recent Value  Intake Tab  Referral Department  Hospitalist  Unit at Time of Referral  Med/Surg Unit  Palliative Care Primary Diagnosis  Cancer  Date Notified  09/20/17  Palliative Care Type  New Palliative care  Reason for referral  Non-pain Symptom  Date of Admission  09/13/2017  Date first seen by Palliative Care  09/21/17  # of days Palliative referral response time  1 Day(s)  # of days IP prior to Palliative referral  9  Clinical Assessment  Psychosocial & Spiritual Assessment  Palliative Care Outcomes      Patient Active Problem List   Diagnosis Date Noted  . AKI (acute kidney injury) ([place])   . Dyspnea   . Palliative care by specialist   . DNR (do not resuscitate)   . Pleural effusion   . Chronic obstructive pulmonary disease ([place])   . Pressure injury of skin 09/12/2017  . Aortic atherosclerosis ([place]) 08/10/2017  . Acute on chronic respiratory failure with hypoxia ([place])   . Atrial flutter ([place]) 08/07/2017  . Idiopathic pericardial effusion 08/07/2017  . Radiation pneumonitis ([place]) 06/30/2017  . Port catheter in place 05/22/2017  . Pericardial effusion 05/22/2017  . Cellulitis of right forearm 05/10/2017  . Encounter for antineoplastic chemotherapy 04/26/2017  . Febrile neutropenia ([place]) 03/15/2017  . Acute renal failure superimposed on stage 4 chronic kidney disease ([place]) 03/15/2017  . Thrombocytopenia ([place]) 03/15/2017  . HCAP (healthcare-associated pneumonia)   . Chronic respiratory failure with hypoxia ([place]) 10/29/2016  . Anemia due to antineoplastic chemotherapy 09/26/2016  . Stage IV squamous cell carcinoma of left lung ([place]) 06/23/2016  . PAF (paroxysmal atrial fibrillation) ([place]) 03/26/2015  . HTN (hypertension)   . Coronary artery disease involving native coronary artery of native heart with angina pectoris ([place])   . Hypothyroidism   . COPD GOLD II  Palliative Care Assessment & Plan   Patient Profile: 77 y.o.femaleadmitted on  12/05/2018past medical history is significant for coronary artery disease with previous stenting, paroxysmal atrial fibrillation, tobacco abuse, COPD, interstitial lung disease, hyperlipidemia, and hypertension. She was diagnosed last year withmetastatic NSCsquamous cell lungcarcinoma. 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 She was treated with chemo and radiation. She had to quit chemotherapy due to side effects. She recently was treated with radiation for her left adrenal mass.  She was having problems with worsening shortness of breath. And also was having problems with leg swelling. A CT back in October showed a pericardial and left pleural effusion. She was having a pericardiocentesis on 08/07/2017 when she became tachypneic and tachycardic she was in atrial flutter and was started on amiodarone. She was admitted to the hospital and was discharged 5 days later. 900 mL of fluid was drained withpericardiocentesis.  According to radiation oncology medical record clinic visit from 08/23/2017 her chest CT showed interval progression.  On 08/25/2017 she had 900 mL of serous fluid drained from her left pleural space.  Cytology from both effusions was negative.  Patient has had continued physical and functional slow decline over the past several months.  Remains hospitalized,today is day 10. Chest tube was removed today.She remains weak and dyspneic at rest.  On the evening of 09/22/2017, patient took clinical decline as evidenced by worsening A. fib and shortness of breath.  She at this point appears to be actively dying    Assessment: Met with patient, bedside nursing as well as chart reviewed.  Also spoke with critical care medicine regarding patient's clinical condition and staffed with supervising physician, Dr. Rowe Pavy.  Critical care medicine as well as Dr. Rowe Pavy arrived at the bedside and concurred with critical nature of patient's current clinical condition Patient's  respirations are shallow, she continues on 100% nonrebreather but there is no agitation and no nonverbal signs and symptoms of pain and overall appears comfortable.  Her feet and knees are heavily mottled.  Extremities cool, cyanotic  Husband arrived at the bedside.  He is tearful.  He stated" I just did not want to except it"   Recommendations/Plan:  Continue with as needed morphine 2-4 mg every 2 hours as needed for respiratory as well as nonverbal signs and symptoms of pain  Continue with Ativan 0.5 mg every 4 hours as needed for agitation  Family apprised of likely prognosis of just hours   Code Status:    Code Status Orders  (From admission, onward)        Start     Ordered   09/20/17 1348  Do not attempt resuscitation (DNR)  Continuous    Question Answer Comment  In the event of cardiac or respiratory ARREST Do not call a "code blue"   In the event of cardiac or respiratory ARREST Do not perform Intubation, CPR, defibrillation or ACLS   In the event of cardiac or respiratory ARREST Use medication by any route, position, wound care, and other measures to relive pain and suffering. May use oxygen, suction and manual treatment of airway obstruction as needed for comfort.      09/20/17 1349    Code Status History    Date Active Date Inactive Code Status Order ID Comments User Context   09/27/2017 18:17 09/20/2017 13:49 Full Code [id number]  Odis Luster Inpatient   08/07/2017 16:05 08/10/2017 16:26 Full Code [id number]  Belva Crome, MD Inpatient   03/15/2017 17:11 03/21/2017 18:36 Full Code [id number]  Robbie Lis, MD Inpatient   03/03/2017 20:58 03/04/2017 21:25 Full Code [id number]  Norval Morton, MD ED   10/29/2016 03:49 11/04/2016 18:29 Full Code [id number]  Karmen Bongo, MD Inpatient       Prognosis:   Hours - Days  Discharge Planning:  Anticipated Hospital Death  Care plan was discussed with Dr. Lake Bells, Dr. Rowe Pavy  Thank you for allowing the Palliative  Medicine Team to assist in the care of this patient.   Time In: 0800 Time Out: 0915 Total Time 75 min Prolonged Time Billed  yes       Greater than 50%  of this time was spent counseling and coordinating care related to the above assessment and plan.  Addendum: Pt passed peacefully at 75 with her husband, son and dtr by her side  Dory Horn, NP  Please contact Palliative Medicine Team phone at [phone] for questions and concerns.

## 2017-10-10 NOTE — Progress Notes (Signed)
Pt heart rhythm Afib RVR, on-call CTS paged.

## 2017-10-10 NOTE — Progress Notes (Signed)
PMT progress note  I have discussed with my colleague Romona Curls, NP and arrived at the bedside to assess Ms Decaire. Ms Bullard and I find an elderly lady with agonal breathing, mottling on both extremities, eyes open, not alert, not responding, appears to have rapid shallow breathing.   BP 91/75 (BP Location: Left Arm)   Pulse (!) 36   Temp 98.3 F (36.8 C) (Oral)   Resp (!) 31   Ht 5\' 4"  (1.626 m)   Wt 69.5 kg (153 lb 3.5 oz)   SpO2 90%   BMI 26.30 kg/m  She is currently on non re breather mask, on 100% oxygen.   Elderly lady with agonal gasping respirations Bradycardic on monitor Mottling on extremities, cool to touch Trace edema Does not respond  Labs and imaging reviewed, reviewed and appreciate input and recommendations from my PCCM and CTS surgery colleagues.   A/P: Actively dying Metastatic lung cancer Diffuse bilateral air space disease noted on imaging  Agree with full scope of comfort measures Agree with use of Morphine for comfort from dyspnea, air hunger, resp distress at end of life.  DNR DNI Prognosis: could be limited to hours at this point.   The patient's husband walked in as Ms Bullard and I were examining the patient. He is tearful and in distress. We offered him extensive support, presence and acknowledged his fear of his imminent loss and the resultant anticipatory anxiety it is having on him.   The patient's husband has questions about end of life signs and symptoms and also has questions about what happens after she passes away. As I exited the room Ms Franchot Mimes was with the patient's husband giving him information.   I have discussed with bedside RN about ordering a comfort cart for husband, additional family to arrive shortly, on call chaplain to also be notified.   We have and will continue to offer support to the patient's husband as death appears imminent for Ms Goga.   35 minutes spent.  Loistine Chance MD [phone]   [facility] palliative medicine team

## 2017-10-10 NOTE — Progress Notes (Signed)
   Sep 24, 2017 1000  Clinical Encounter Type  Visited With Patient and family together  Visit Type Death  Referral From Nurse  Consult/Referral To Chaplain  Spiritual Encounters  Spiritual Needs Grief support;Emotional;Prayer   Responded to a page to provide family support as the patient has passed.  Was present with the spouse and other families members offing prayers and a listening.  Was able to speak with the daughter as I was leaving to give her the patient placement card as there are no plans yet.  Invited them to stay as long as they want.   Chaplain Katherene Ponto

## 2017-10-10 NOTE — Death Summary Note (Signed)
Physician Discharge Summary  Patient ID: Renee Vance MRN: [id number] DOB/AGE: 79-Jul-1942 79 y.o.  Admit date: 10/04/2017 Discharge date: 09/27/2017  Admission Diagnoses: Pericardial effusion  Discharge Diagnoses:   Aspiration pneumonia Active Problems:   Pericardial effusion   Pressure injury of skin  Patient Active Problem List   Diagnosis Date Noted  . Pressure injury of skin 09/12/2017  . Aortic atherosclerosis ([place]) 08/10/2017  . Acute on chronic respiratory failure with hypoxia ([place])   . Atrial flutter ([place]) 08/07/2017  . Idiopathic pericardial effusion 08/07/2017  . Radiation pneumonitis ([place]) 06/30/2017  . Port catheter in place 05/22/2017  . Pericardial effusion 05/22/2017  . Cellulitis of right forearm 05/10/2017  . Encounter for antineoplastic chemotherapy 04/26/2017  . Febrile neutropenia ([place]) 03/15/2017  . Acute renal failure superimposed on stage 4 chronic kidney disease ([place]) 03/15/2017  . Thrombocytopenia ([place]) 03/15/2017  . HCAP (healthcare-associated pneumonia)   . Chronic respiratory failure with hypoxia ([place]) 10/29/2016  . Anemia due to antineoplastic chemotherapy 09/26/2016  . Stage IV squamous cell carcinoma of left lung ([place]) 06/23/2016  . PAF (paroxysmal atrial fibrillation) ([place]) 03/26/2015  . HTN (hypertension)   . Coronary artery disease involving native coronary artery of native heart with angina pectoris ([place])   . Hypothyroidism   . COPD GOLD II      HPI: Renee Vance is sent for consultation re: a pericardial effusion  Renee Vance is a 77 year old lung cancer was recently found to have a pericardial effusion.  Her past medical history is significant for coronary artery disease with previous stenting, paroxysmal atrial fibrillation, tobacco abuse, COPD, interstitial lung disease, hyperlipidemia, and hypertension.  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  She now has  adrenal metastasis.  She was treated with chemo and radiation.  She had to quit chemotherapy due to side effects.  She recently was treated with radiation for her left adrenal mass.  She was having problems with worsening shortness of breath.  And also was having problems with leg swelling.  A CT back in October showed a pericardial and left pleural effusion.  She was having a pericardiocentesis on 08/07/2017 when she became tachypneic and tachycardic she was in atrial flutter and was started on amiodarone.  She was admitted to the hospital and was discharged 5 days later.  900 mL of fluid was drained with pericardiocentesis.  On 08/25/2017 she had 900 mL of serous fluid drained from her left pleural space.  Cytology from both effusions was negative.  She currently is on home oxygen at 4 L nasal cannula.  She does get short of breath easily.  She is also having worsening swelling in her lower extremities, right greater than left.  She does not have orthopnea.  She was admitted electively for a pericardial window as well as placement of a left-sided chest tube.  Discharged Condition: expired  Hospital Course: The patient was admitted electively for the procedure which she initially tolerated well without difficulty.  She was taken to the postanesthesia care unit in stable condition.   Postoperative hospital course:  The patient initially did well however over the next several days of hospitalization there was a general deterioration in her overall pulmonary status.  Both tubes were left in place for several days due to moderate drainage which did discontinue over time and they were eventually removed.  She initially also developed significant nausea and vomiting which took several days to resolve.  Cultures from surgery were unremarkable.  As pulmonary status deteriorated CCM/pulmonology was assisting with management. She was treated for a presumed aspiration pneumonia. She developed ongoing  and progressive weakness.  Renal function showed significant deterioration over time as well.  She developed a increasing leukocytosis.  Antibiotics were adjusted over time to broad-spectrum coverage.  She developed a worsening anemia and was transfused.  She became significantly more edematous and was diuresed aggressively.  Ultimately discussion with family was made to discuss how aggressive to continue to manage the patient.  They did not wish for her to be placed on the ventilator.  As it appeared that she was continuing to show worsening clinical condition palliative care was consulted to assist with comfort measures.  On 2017/10/18 the patient did pass away.     Consults: None  Significant Diagnostic Studies: routine labs and serial CXR's  Treatments: surgery:   PHYSICIAN:  Remo Lipps C. Roxan Hockey, M.D.DATE OF BIRTH:  [birth year]  DATE OF PROCEDURE:  09/09/2017 DATE OF DISCHARGE:                              OPERATIVE REPORT   PREOPERATIVE DIAGNOSES:  Pericardial effusion and left pleural effusion.  POSTOPERATIVE DIAGNOSES:  Pericardial effusion and left pleural effusion.  PROCEDURE:  Subxiphoid pericardial window and left chest tube placement.  SURGEON:  Revonda Standard. Roxan Hockey, MD.  ASSISTANT:  Jadene Pierini, PA-C.  ANESTHESIA:  General.  DIAGNOSIS Diagnosis Pericardium, biopsy - MINIMALLY INFLAMED FIBROADIPOSE TISSUE. - THERE IS NO EVIDENCE OF MALIGNANCY. Enid Cutter MD Pathologist, Electronic Signature (Case signed 09/12/2017) Specimen Gross and Clinical Information Specimen(s) Obtained: Pericardium, biopsy Specimen Clinical Information pericardial effusion (cm) Gross Received in saline and consists of a 2.4 x 1.8 x 0.6 cm piece of tan pink, hyperemic, soft tissue. Representative sections are submitted in one cassette. (KL:gt, 10/06/2017) Report signed out from the following location(s) Technical component and interpretation was performed at [facility] [place], [place],  [address]. CLIA #: [id number], 1 of  Discharge Exam: Blood pressure 118/67, pulse 65, temperature 98.2 F (36.8 C), temperature source Oral, resp. rate 17, height 5\' 4"  (1.626 m), weight 153 lb 14.1 oz (69.8 kg), SpO2 99 %. Deceased  Disposition: deceased    Signed: John Giovanni 10/08/2017, 12:15 PM

## 2017-10-10 NOTE — Progress Notes (Addendum)
0725 - Rapid response called by PM nurse due to patient in respiratory distress and on call MD in surgery and unable to come to bedside, family was notified by PM nurse that pt had a change in breathing patterns and was now unresponsive  0800- palliative team notified, and came to bedside.  9179 - 4mg  morphine given per MD order due to patient in distress, and appears uncomfortable  0950 - pt having increasing episodes of asystole followed by spontaneous return of rhythm. Monitors changed to comfort care mode and family at bedside,   74 - pt passed peacefully with family at bedside, on call MD notified, This RN and Clarise Cruz, NP with palliative team called TOD.   Post Mortem checklist complete, CDS called, patient placement notified, Pts son is on the way from [place] and requested to visit with his mother so she will be left in the room at this time. Phone number provided to other family for patient placement for when decision is made about arrangements for the funeral.

## 2017-10-10 NOTE — Progress Notes (Signed)
   2017/10/19 7989  Clinical Encounter Type  Visited With Patient and family together;Health care provider  Visit Type Patient actively dying  Referral From Nurse  Consult/Referral To Chaplain  Spiritual Encounters  Spiritual Needs Grief support;Emotional;Prayer  Stress Factors  Family Stress Factors Major life changes   Responded to a request from the [facility] nurse to provide support for a family whose loved one is EOL.I met with the nurse for that patient and she let me know that the change had come pretty quickly and the family was struggling to come to terms with current medical state.  Patient is catholic and initially they asked about last rights.  I let them know that I am not a priest, but could pray for them and would be happy to call a priest if they wanted me to. Patient does not belong to a specific parish.  Family was fine with me coming in and praying with them.  I sat and listen to some of what was going on, and spouse was starting to try and come to grips with what is going on.  We prayed together and I offered support and empathetic listening.  Will follow up later today.  I let the nurse know to page me in the meantime if the family needs additional support. Chaplain Katherene Ponto

## 2017-10-10 NOTE — Progress Notes (Signed)
Rapid response called per Renee Vance's order.

## 2017-10-10 NOTE — Progress Notes (Signed)
No call back received from CTS,

## 2017-10-10 NOTE — Progress Notes (Addendum)
[facility] PCCM  S: Called emergently to the bedside to evaluate Renee Vance for worsening dyspnea, respiratory distress O:  Vitals:   30-Sep-2017 0307 09/30/2017 0744 09-30-17 0752 2017-09-30 0757  BP: (!) 117/51 97/61  91/75  Pulse: 75   (!) 36  Resp: (!) 23 (!) 34  (!) 31  Temp: (!) 97.5 F (36.4 C)   98.3 F (36.8 C)  TempSrc: Axillary   Oral  SpO2: 98% (!) 83% (!) 85% 90%  Weight: 69.5 kg (153 lb 3.5 oz)     Height:       100% NRB mask  General:  Agonal breathing in bed HENT: NCAT OP clear PULM: Increased work of breathing, tachypnea, crackles bilaterally CV: bradycardic, distant heart sounds GI: BS+, soft, nontender MSK: normal bulk and tone Neuro: non-responsive for me, no response to external stimuli  CXR images reviewed showing diffuse bilateral airspace disease  Notes from my partners, TCTS, and  Palliative medicine reviewed stating that we need to focus on comfort  Impression:  Acute respiratory failure with hypoxemia Diffuse bilateral airspace disease Metastatic lung cancer  Discussion: She is actively dying.  She has a disease man cannot cure.  We need to focus on comfort.  Plan: I called the patient's husband but had to leave a message I ordered 4mg  IV morphine because the patient is in respiratory distress I called the patient's son Renee Vance and advised him that the patient is dying, he says that he will come to the hospital in about 3.5 hours, he lives in [place].  I asked him to call his dad to update him as well.   Roselie Awkward, MD [place] PCCM Pager: [phone] Cell: [phone] After 3pm or if no response, call [phone]

## 2017-10-10 NOTE — Progress Notes (Signed)
4mg  morphine given per verbal order of Dr. Lake Bells, CCM.

## 2017-10-10 DEATH — deceased

## 2017-10-25 ENCOUNTER — Ambulatory Visit: Payer: PPO | Admitting: Cardiology

## 2017-10-25 LAB — ACID FAST CULTURE WITH REFLEXED SENSITIVITIES (MYCOBACTERIA): Acid Fast Culture: NEGATIVE

## 2017-11-01 LAB — ACID FAST CULTURE WITH REFLEXED SENSITIVITIES (MYCOBACTERIA): Acid Fast Culture: NEGATIVE

## 2018-12-07 IMAGING — CT CT BIOPSY
4 of 6 series · 12 of 32 positions shown, 17 images · non-contrast
Comparison: PET-CT - 01/18/2017;

INDICATION: History of lung cancer, now with indeterminate hypermetabolic left
adrenal gland nodule.

EXAM:
CT GUIDED BIOPSY OF INDETERMINATE LEFT ADRENAL GLAND NODULE

[Series 2: i-spiral 5.0 b31f · axial · 0.93mm/px · z∈[+1322,+1410]mm · 5 of 39 slices shown, 10 images (1 of 4)]
[im 7/39  soft-tissue]
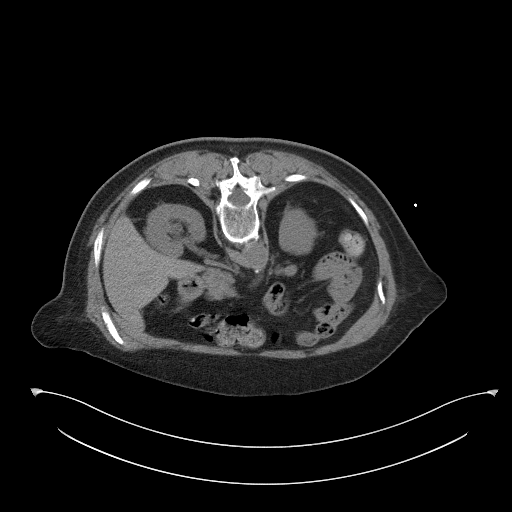
[im 7/39  bone]
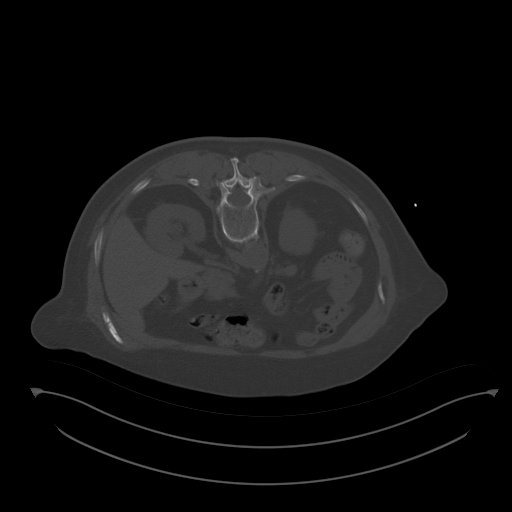
[im 13/39  soft-tissue]
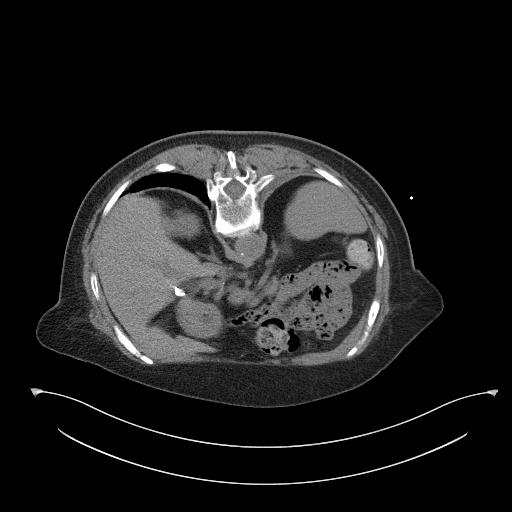
[im 13/39  lung]
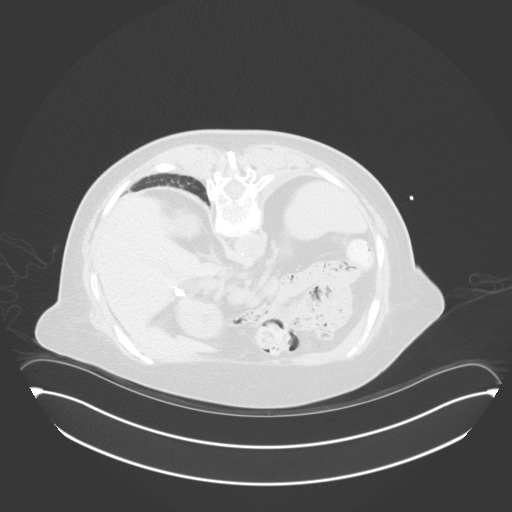
[im 20/39  soft-tissue]
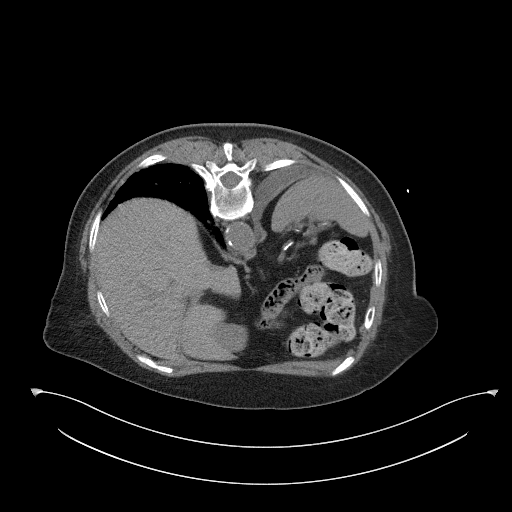
[im 20/39  lung]
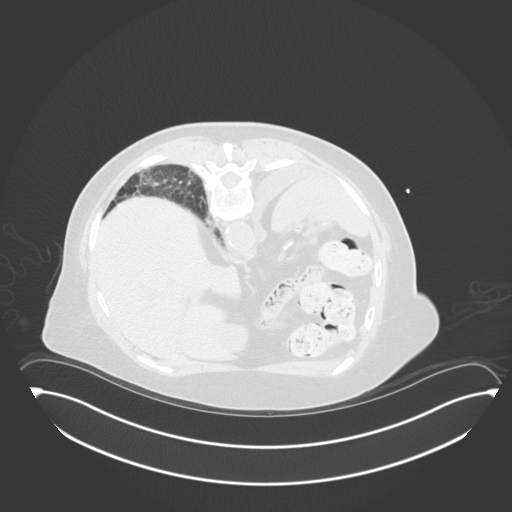
[im 26/39  soft-tissue]
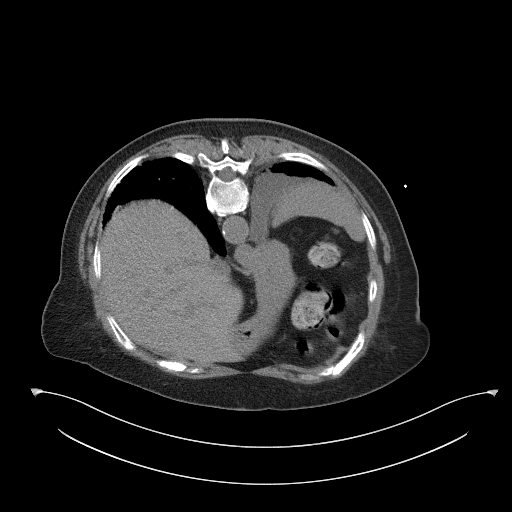
[im 26/39  lung]
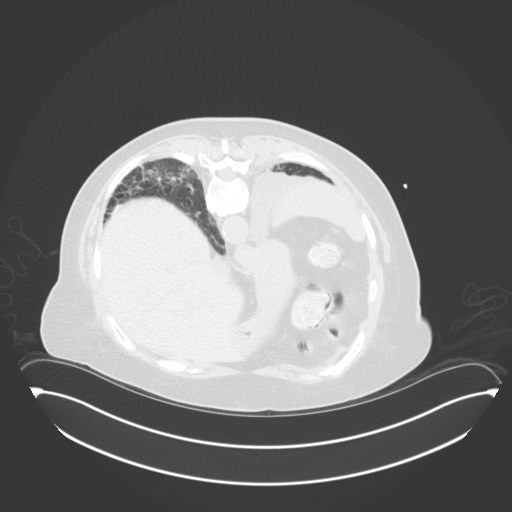
[im 32/39  soft-tissue]
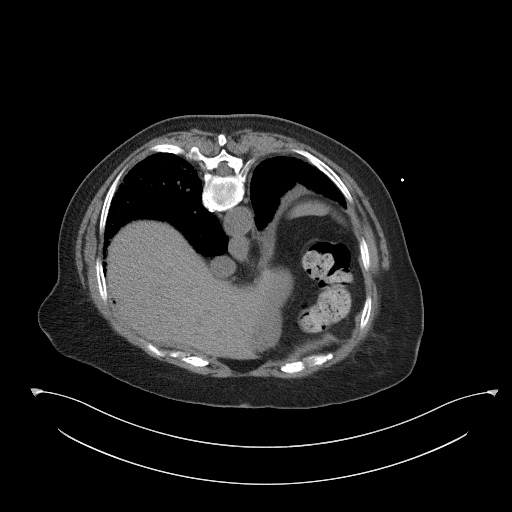
[im 32/39  lung]
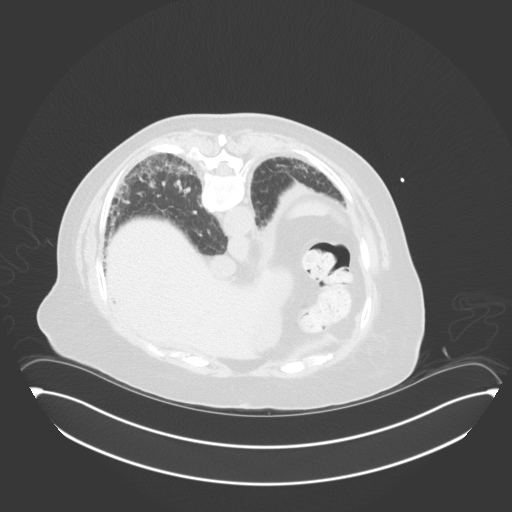

[Series 3: i-spiral 5.0 b31f · axial · 0.93mm/px · z∈[+1327,+1380]mm · 3 of 31 slices shown (2 of 4)]
[im 8/31  soft-tissue]
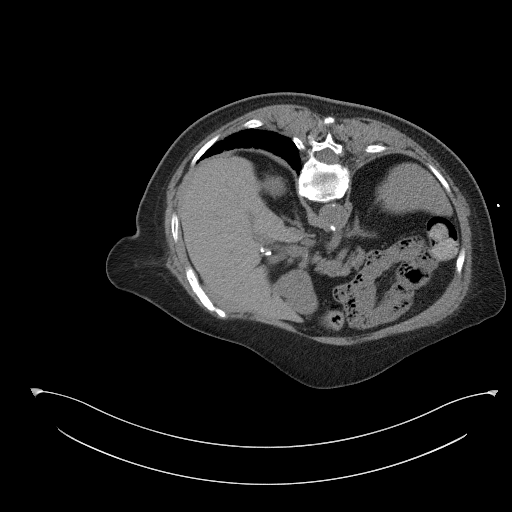
[im 16/31  soft-tissue]
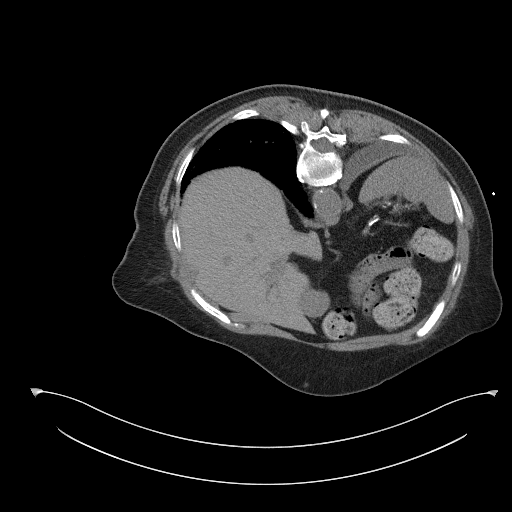
[im 23/31  soft-tissue]
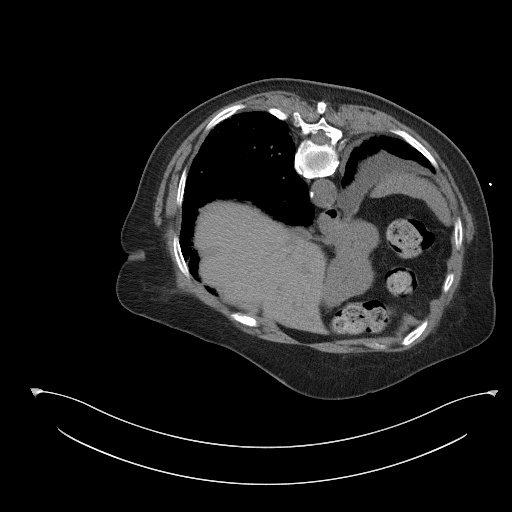

[Series 5: i-spiral 5.0 b31f · axial · 0.93mm/px · z∈[+1301,+1333]mm · 2 of 27 slices shown (3 of 4)]
[im 9/27  soft-tissue]
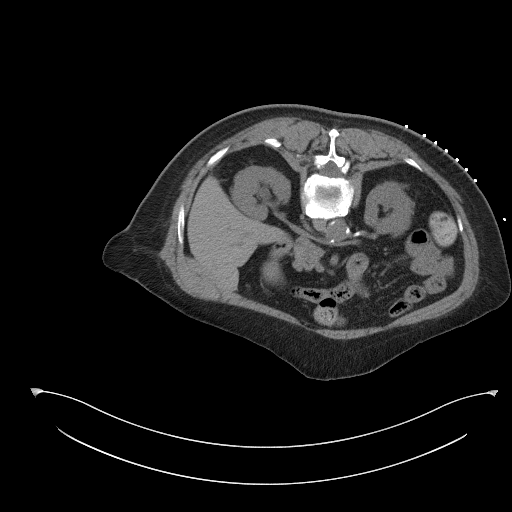
[im 18/27  soft-tissue]
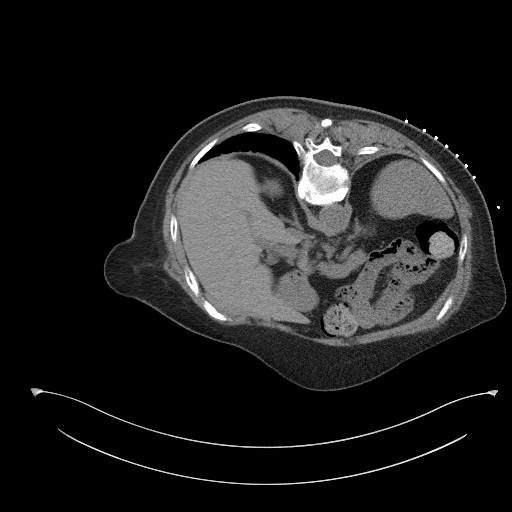

[Series 7: i-spiral 5.0 b31f · axial · 0.93mm/px · z∈[+1305,+1337]mm · 2 of 27 slices shown (4 of 4)]
[im 9/27  soft-tissue]
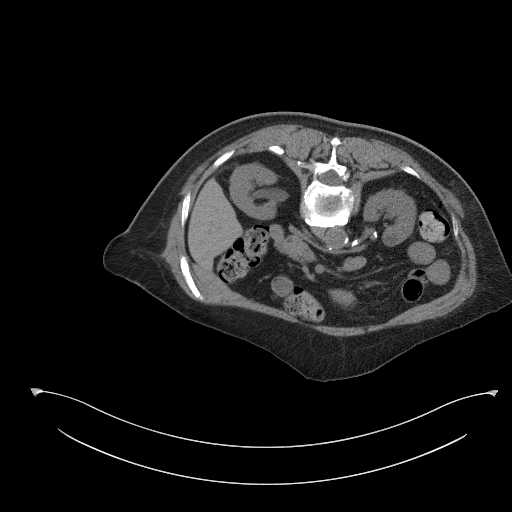
[im 18/27  soft-tissue]
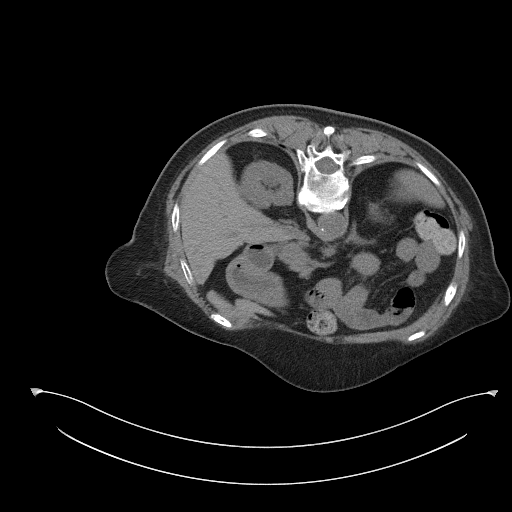

[12 of 32 positions shown; findings below may reference images not displayed]

CT abdomen pelvis - 01/02/2017

MEDICATIONS:
None.

ANESTHESIA/SEDATION:
Fentanyl 50 mcg IV; Versed 1 mg IV

Sedation time: 20 minutes; The patient was continuously monitored
during the procedure by the interventional radiology nurse under my
direct supervision.

CONTRAST:  None.

COMPLICATIONS:
None immediate.

PROCEDURE:
Informed consent was obtained from the patient following an
explanation of the procedure, risks, benefits and alternatives. A
time out was performed prior to the initiation of the procedure.

The patient was positioned prone on the CT table and a limited CT
was performed for procedural planning demonstrating unchanged size
and appearance of the approximately 1.2 x 0.9 cm left adrenal gland
nodule (image 16, series 5). The procedure was planned. The
operative site was prepped and draped in the usual sterile fashion.
Appropriate trajectory was confirmed with a 22 gauge spinal needle
after the adjacent tissues were anesthetized with 1% Lidocaine with
epinephrine.

Under intermittent CT guidance, a 17 gauge coaxial needle was
advanced into the peripheral aspect of the mass.

Appropriate positioning was confirmed and 4 core needle biopsy
samples were obtained with an 18 gauge core needle biopsy device.
The co-axial needle was removed and hemostasis was achieved with
manual compression.

A limited postprocedural CT was negative for hemorrhage or
additional complication. A dressing was placed. The patient
tolerated the procedure well without immediate postprocedural
complication.
IMPRESSION: Technically successful CT guided core needle biopsy of indeterminate
left adrenal gland nodule.
